# Patient Record
Sex: Female | Born: 1935 | Race: White | Hispanic: No | Smoking: Never smoker
Health system: Southern US, Community
[De-identification: ages and names within clinical notes are randomized; demographics above are authoritative.]

## PROBLEM LIST (undated history)

## (undated) DIAGNOSIS — J449 Chronic obstructive pulmonary disease, unspecified: Secondary | ICD-10-CM

## (undated) DIAGNOSIS — I499 Cardiac arrhythmia, unspecified: Secondary | ICD-10-CM

## (undated) DIAGNOSIS — M1990 Unspecified osteoarthritis, unspecified site: Secondary | ICD-10-CM

## (undated) DIAGNOSIS — N189 Chronic kidney disease, unspecified: Secondary | ICD-10-CM

## (undated) DIAGNOSIS — H409 Unspecified glaucoma: Secondary | ICD-10-CM

## (undated) DIAGNOSIS — S72009A Fracture of unspecified part of neck of unspecified femur, initial encounter for closed fracture: Secondary | ICD-10-CM

## (undated) DIAGNOSIS — J45909 Unspecified asthma, uncomplicated: Secondary | ICD-10-CM

## (undated) DIAGNOSIS — E119 Type 2 diabetes mellitus without complications: Secondary | ICD-10-CM

## (undated) DIAGNOSIS — K219 Gastro-esophageal reflux disease without esophagitis: Secondary | ICD-10-CM

## (undated) DIAGNOSIS — C801 Malignant (primary) neoplasm, unspecified: Secondary | ICD-10-CM

## (undated) DIAGNOSIS — Z950 Presence of cardiac pacemaker: Secondary | ICD-10-CM

## (undated) DIAGNOSIS — I10 Essential (primary) hypertension: Secondary | ICD-10-CM

## (undated) DIAGNOSIS — H9190 Unspecified hearing loss, unspecified ear: Secondary | ICD-10-CM

## (undated) DIAGNOSIS — G4730 Sleep apnea, unspecified: Secondary | ICD-10-CM

## (undated) DIAGNOSIS — I2510 Atherosclerotic heart disease of native coronary artery without angina pectoris: Secondary | ICD-10-CM

## (undated) DIAGNOSIS — I509 Heart failure, unspecified: Secondary | ICD-10-CM

## (undated) DIAGNOSIS — E0590 Thyrotoxicosis, unspecified without thyrotoxic crisis or storm: Secondary | ICD-10-CM

## (undated) DIAGNOSIS — I4891 Unspecified atrial fibrillation: Secondary | ICD-10-CM

## (undated) HISTORY — PX: CHOLECYSTECTOMY: SHX55

## (undated) HISTORY — PX: EYE SURGERY: SHX253

## (undated) HISTORY — PX: ABDOMINAL HYSTERECTOMY: SHX81

## (undated) HISTORY — PX: ENUCLEATION: SHX628

## (undated) HISTORY — DX: Fracture of unspecified part of neck of unspecified femur, initial encounter for closed fracture: S72.009A

## (undated) HISTORY — PX: INSERT / REPLACE / REMOVE PACEMAKER: SUR710

## (undated) HISTORY — PX: BRAIN SURGERY: SHX531

---

## 2004-01-31 ENCOUNTER — Ambulatory Visit: Payer: Self-pay

## 2004-03-14 ENCOUNTER — Ambulatory Visit: Payer: Self-pay | Admitting: Family Medicine

## 2005-02-16 ENCOUNTER — Other Ambulatory Visit: Payer: Self-pay

## 2005-02-16 ENCOUNTER — Emergency Department: Payer: Self-pay | Admitting: Unknown Physician Specialty

## 2005-03-18 ENCOUNTER — Ambulatory Visit: Payer: Self-pay | Admitting: Family Medicine

## 2006-02-03 ENCOUNTER — Ambulatory Visit: Payer: Self-pay | Admitting: Family Medicine

## 2006-03-24 ENCOUNTER — Ambulatory Visit: Payer: Self-pay | Admitting: Family Medicine

## 2006-11-17 ENCOUNTER — Ambulatory Visit: Payer: Self-pay | Admitting: Surgery

## 2006-11-23 ENCOUNTER — Ambulatory Visit: Payer: Self-pay | Admitting: Surgery

## 2006-12-07 DIAGNOSIS — I10 Essential (primary) hypertension: Secondary | ICD-10-CM | POA: Insufficient documentation

## 2007-02-23 ENCOUNTER — Ambulatory Visit: Payer: Self-pay | Admitting: Gastroenterology

## 2007-05-21 ENCOUNTER — Ambulatory Visit: Payer: Self-pay | Admitting: Internal Medicine

## 2008-04-28 ENCOUNTER — Inpatient Hospital Stay: Payer: Self-pay | Admitting: Internal Medicine

## 2008-05-15 ENCOUNTER — Inpatient Hospital Stay: Payer: Self-pay | Admitting: Internal Medicine

## 2008-06-30 ENCOUNTER — Ambulatory Visit: Payer: Self-pay | Admitting: Internal Medicine

## 2009-04-11 ENCOUNTER — Ambulatory Visit: Payer: Self-pay | Admitting: Ophthalmology

## 2009-07-05 ENCOUNTER — Ambulatory Visit: Payer: Self-pay | Admitting: Internal Medicine

## 2009-07-18 ENCOUNTER — Ambulatory Visit: Payer: Self-pay | Admitting: Gastroenterology

## 2009-10-27 ENCOUNTER — Emergency Department: Payer: Self-pay | Admitting: Emergency Medicine

## 2010-07-05 ENCOUNTER — Inpatient Hospital Stay: Payer: Self-pay | Admitting: Internal Medicine

## 2010-08-06 ENCOUNTER — Ambulatory Visit: Payer: Self-pay | Admitting: Internal Medicine

## 2011-06-17 ENCOUNTER — Other Ambulatory Visit: Payer: Self-pay

## 2011-07-08 LAB — CULTURE, FUNGUS WITHOUT SMEAR

## 2011-08-25 ENCOUNTER — Emergency Department: Payer: Self-pay | Admitting: Emergency Medicine

## 2011-09-08 ENCOUNTER — Emergency Department: Payer: Self-pay | Admitting: Emergency Medicine

## 2011-10-22 ENCOUNTER — Emergency Department: Payer: Self-pay | Admitting: *Deleted

## 2011-10-22 LAB — COMPREHENSIVE METABOLIC PANEL
Alkaline Phosphatase: 104 U/L (ref 50–136)
Anion Gap: 11 (ref 7–16)
Bilirubin,Total: 0.8 mg/dL (ref 0.2–1.0)
Calcium, Total: 9.4 mg/dL (ref 8.5–10.1)
Chloride: 106 mmol/L (ref 98–107)
Co2: 22 mmol/L (ref 21–32)
Creatinine: 1.26 mg/dL (ref 0.60–1.30)
EGFR (African American): 44 — ABNORMAL LOW
EGFR (Non-African Amer.): 38 — ABNORMAL LOW
Glucose: 122 mg/dL — ABNORMAL HIGH (ref 65–99)
Osmolality: 285 (ref 275–301)
SGPT (ALT): 27 U/L
Sodium: 139 mmol/L (ref 136–145)
Total Protein: 7.6 g/dL (ref 6.4–8.2)

## 2011-10-22 LAB — CBC
HCT: 35.8 % (ref 35.0–47.0)
HGB: 11.8 g/dL — ABNORMAL LOW (ref 12.0–16.0)
MCH: 29.3 pg (ref 26.0–34.0)
MCV: 89 fL (ref 80–100)
Platelet: 291 10*3/uL (ref 150–440)
RBC: 4.02 10*6/uL (ref 3.80–5.20)
WBC: 9.6 10*3/uL (ref 3.6–11.0)

## 2011-10-22 LAB — URINALYSIS, COMPLETE
Bilirubin,UR: NEGATIVE
Glucose,UR: NEGATIVE mg/dL (ref 0–75)
Ketone: NEGATIVE
Protein: NEGATIVE
Specific Gravity: 1.019 (ref 1.003–1.030)
Squamous Epithelial: 2

## 2012-01-07 ENCOUNTER — Ambulatory Visit: Payer: Self-pay | Admitting: Internal Medicine

## 2013-01-07 ENCOUNTER — Ambulatory Visit: Payer: Self-pay | Admitting: Internal Medicine

## 2013-11-09 DIAGNOSIS — J9611 Chronic respiratory failure with hypoxia: Secondary | ICD-10-CM | POA: Insufficient documentation

## 2014-01-24 ENCOUNTER — Ambulatory Visit: Payer: Self-pay | Admitting: Internal Medicine

## 2014-02-16 ENCOUNTER — Emergency Department: Payer: Self-pay | Admitting: Student

## 2014-02-16 LAB — CBC
HCT: 40.5 % (ref 35.0–47.0)
HGB: 13.3 g/dL (ref 12.0–16.0)
MCH: 30 pg (ref 26.0–34.0)
MCHC: 32.9 g/dL (ref 32.0–36.0)
MCV: 91 fL (ref 80–100)
Platelet: 253 10*3/uL (ref 150–440)
RBC: 4.44 10*6/uL (ref 3.80–5.20)
RDW: 14 % (ref 11.5–14.5)
WBC: 8.1 10*3/uL (ref 3.6–11.0)

## 2014-02-16 LAB — BASIC METABOLIC PANEL
Anion Gap: 8 (ref 7–16)
BUN: 16 mg/dL (ref 7–18)
Calcium, Total: 8.3 mg/dL — ABNORMAL LOW (ref 8.5–10.1)
Chloride: 104 mmol/L (ref 98–107)
Co2: 26 mmol/L (ref 21–32)
Creatinine: 1.18 mg/dL (ref 0.60–1.30)
EGFR (African American): 55 — ABNORMAL LOW
EGFR (Non-African Amer.): 46 — ABNORMAL LOW
Glucose: 175 mg/dL — ABNORMAL HIGH (ref 65–99)
Osmolality: 281 (ref 275–301)
Potassium: 3.5 mmol/L (ref 3.5–5.1)
SODIUM: 138 mmol/L (ref 136–145)

## 2014-02-16 LAB — MAGNESIUM: Magnesium: 1.7 mg/dL — ABNORMAL LOW

## 2014-02-17 ENCOUNTER — Ambulatory Visit: Payer: Self-pay | Admitting: Cardiovascular Disease

## 2014-02-17 DIAGNOSIS — J45909 Unspecified asthma, uncomplicated: Secondary | ICD-10-CM | POA: Insufficient documentation

## 2014-02-17 DIAGNOSIS — I509 Heart failure, unspecified: Secondary | ICD-10-CM | POA: Insufficient documentation

## 2014-12-09 ENCOUNTER — Encounter: Payer: Self-pay | Admitting: Emergency Medicine

## 2014-12-09 ENCOUNTER — Inpatient Hospital Stay
Admission: EM | Admit: 2014-12-09 | Discharge: 2014-12-13 | DRG: 243 | Disposition: A | Discharge status: Home / Self Care | Payer: Medicare HMO | Attending: Internal Medicine | Admitting: Internal Medicine

## 2014-12-09 ENCOUNTER — Emergency Department: Payer: Medicare HMO

## 2014-12-09 DIAGNOSIS — Z9981 Dependence on supplemental oxygen: Secondary | ICD-10-CM

## 2014-12-09 DIAGNOSIS — E119 Type 2 diabetes mellitus without complications: Secondary | ICD-10-CM | POA: Diagnosis present

## 2014-12-09 DIAGNOSIS — R296 Repeated falls: Secondary | ICD-10-CM | POA: Diagnosis present

## 2014-12-09 DIAGNOSIS — I2510 Atherosclerotic heart disease of native coronary artery without angina pectoris: Secondary | ICD-10-CM | POA: Diagnosis present

## 2014-12-09 DIAGNOSIS — Z950 Presence of cardiac pacemaker: Secondary | ICD-10-CM

## 2014-12-09 DIAGNOSIS — R270 Ataxia, unspecified: Secondary | ICD-10-CM | POA: Diagnosis present

## 2014-12-09 DIAGNOSIS — S022XXA Fracture of nasal bones, initial encounter for closed fracture: Secondary | ICD-10-CM | POA: Diagnosis present

## 2014-12-09 DIAGNOSIS — I495 Sick sinus syndrome: Secondary | ICD-10-CM | POA: Diagnosis present

## 2014-12-09 DIAGNOSIS — R001 Bradycardia, unspecified: Secondary | ICD-10-CM | POA: Diagnosis not present

## 2014-12-09 DIAGNOSIS — Y929 Unspecified place or not applicable: Secondary | ICD-10-CM

## 2014-12-09 DIAGNOSIS — R220 Localized swelling, mass and lump, head: Secondary | ICD-10-CM | POA: Diagnosis not present

## 2014-12-09 DIAGNOSIS — R0981 Nasal congestion: Secondary | ICD-10-CM | POA: Diagnosis present

## 2014-12-09 DIAGNOSIS — S61012A Laceration without foreign body of left thumb without damage to nail, initial encounter: Secondary | ICD-10-CM

## 2014-12-09 DIAGNOSIS — R0902 Hypoxemia: Secondary | ICD-10-CM | POA: Diagnosis present

## 2014-12-09 DIAGNOSIS — W109XXA Fall (on) (from) unspecified stairs and steps, initial encounter: Secondary | ICD-10-CM | POA: Diagnosis present

## 2014-12-09 DIAGNOSIS — R29898 Other symptoms and signs involving the musculoskeletal system: Secondary | ICD-10-CM

## 2014-12-09 DIAGNOSIS — I10 Essential (primary) hypertension: Secondary | ICD-10-CM | POA: Diagnosis present

## 2014-12-09 DIAGNOSIS — S51811A Laceration without foreign body of right forearm, initial encounter: Secondary | ICD-10-CM

## 2014-12-09 DIAGNOSIS — S60811A Abrasion of right wrist, initial encounter: Secondary | ICD-10-CM | POA: Diagnosis present

## 2014-12-09 DIAGNOSIS — I4891 Unspecified atrial fibrillation: Secondary | ICD-10-CM | POA: Diagnosis present

## 2014-12-09 DIAGNOSIS — Z8673 Personal history of transient ischemic attack (TIA), and cerebral infarction without residual deficits: Secondary | ICD-10-CM

## 2014-12-09 DIAGNOSIS — S0033XA Contusion of nose, initial encounter: Secondary | ICD-10-CM

## 2014-12-09 DIAGNOSIS — N390 Urinary tract infection, site not specified: Secondary | ICD-10-CM | POA: Diagnosis present

## 2014-12-09 DIAGNOSIS — S51819A Laceration without foreign body of unspecified forearm, initial encounter: Secondary | ICD-10-CM | POA: Diagnosis present

## 2014-12-09 DIAGNOSIS — J449 Chronic obstructive pulmonary disease, unspecified: Secondary | ICD-10-CM | POA: Diagnosis present

## 2014-12-09 HISTORY — DX: Atherosclerotic heart disease of native coronary artery without angina pectoris: I25.10

## 2014-12-09 HISTORY — DX: Essential (primary) hypertension: I10

## 2014-12-09 HISTORY — DX: Type 2 diabetes mellitus without complications: E11.9

## 2014-12-09 LAB — CBC WITH DIFFERENTIAL/PLATELET
BASOS ABS: 0.1 10*3/uL (ref 0–0.1)
Basophils Relative: 1 %
EOS ABS: 0.1 10*3/uL (ref 0–0.7)
EOS PCT: 1 %
HCT: 42 % (ref 35.0–47.0)
Hemoglobin: 14.1 g/dL (ref 12.0–16.0)
Lymphocytes Relative: 19 %
Lymphs Abs: 1.9 10*3/uL (ref 1.0–3.6)
MCH: 30.3 pg (ref 26.0–34.0)
MCHC: 33.5 g/dL (ref 32.0–36.0)
MCV: 90.3 fL (ref 80.0–100.0)
Monocytes Absolute: 0.9 10*3/uL (ref 0.2–0.9)
Monocytes Relative: 9 %
Neutro Abs: 7 10*3/uL — ABNORMAL HIGH (ref 1.4–6.5)
Neutrophils Relative %: 70 %
PLATELETS: 204 10*3/uL (ref 150–440)
RBC: 4.65 MIL/uL (ref 3.80–5.20)
RDW: 15.1 % — AB (ref 11.5–14.5)
WBC: 9.9 10*3/uL (ref 3.6–11.0)

## 2014-12-09 LAB — BASIC METABOLIC PANEL
Anion gap: 14 (ref 5–15)
BUN: 28 mg/dL — AB (ref 6–20)
CALCIUM: 9.4 mg/dL (ref 8.9–10.3)
CO2: 23 mmol/L (ref 22–32)
CREATININE: 1.27 mg/dL — AB (ref 0.44–1.00)
Chloride: 105 mmol/L (ref 101–111)
GFR calc non Af Amer: 36 mL/min — ABNORMAL LOW (ref >60)
GFR, EST AFRICAN AMERICAN: 41 mL/min — AB (ref >60)
Glucose, Bld: 139 mg/dL — ABNORMAL HIGH (ref 65–99)
Potassium: 4 mmol/L (ref 3.5–5.1)
SODIUM: 142 mmol/L (ref 135–145)

## 2014-12-09 LAB — PROTIME-INR
INR: 1.86
PROTHROMBIN TIME: 21.6 s — AB (ref 11.4–15.0)

## 2014-12-09 MED ORDER — SODIUM CHLORIDE 0.9 % IV SOLN
250.0000 mL | INTRAVENOUS | Status: DC | PRN
  Administered 2014-12-12: 12:00:00 via INTRAVENOUS

## 2014-12-09 MED ORDER — AMIODARONE HCL 200 MG PO TABS
100.0000 mg | ORAL_TABLET | Freq: Every day | ORAL | Status: DC
  Filled 2014-12-09: qty 1

## 2014-12-09 MED ORDER — AMLODIPINE BESYLATE 5 MG PO TABS
5.0000 mg | ORAL_TABLET | Freq: Every day | ORAL | Status: DC
  Administered 2014-12-10: 5 mg via ORAL
  Filled 2014-12-09: qty 1

## 2014-12-09 MED ORDER — ACETAMINOPHEN 325 MG PO TABS
650.0000 mg | ORAL_TABLET | Freq: Four times a day (QID) | ORAL | Status: DC | PRN
  Administered 2014-12-09 – 2014-12-12 (×3): 650 mg via ORAL
  Filled 2014-12-09 (×3): qty 2

## 2014-12-09 MED ORDER — SALINE SPRAY 0.65 % NA SOLN
2.0000 | NASAL | Status: DC | PRN
  Administered 2014-12-10 – 2014-12-12 (×5): 2 via NASAL
  Filled 2014-12-09: qty 44

## 2014-12-09 MED ORDER — SODIUM CHLORIDE 0.9 % IJ SOLN
3.0000 mL | INTRAMUSCULAR | Status: DC | PRN
  Administered 2014-12-12: 3 mL via INTRAVENOUS
  Filled 2014-12-09: qty 10

## 2014-12-09 MED ORDER — GLIMEPIRIDE 2 MG PO TABS
2.0000 mg | ORAL_TABLET | Freq: Every day | ORAL | Status: DC
  Administered 2014-12-10 – 2014-12-13 (×4): 2 mg via ORAL
  Filled 2014-12-09 (×4): qty 1

## 2014-12-09 MED ORDER — ACETAMINOPHEN 650 MG RE SUPP: 650.0000 mg | Freq: Four times a day (QID) | RECTAL | Status: DC | PRN

## 2014-12-09 MED ORDER — SODIUM CHLORIDE 0.9 % IJ SOLN
3.0000 mL | Freq: Two times a day (BID) | INTRAMUSCULAR | Status: DC
  Administered 2014-12-10 – 2014-12-13 (×6): 3 mL via INTRAVENOUS

## 2014-12-09 NOTE — ED Provider Notes (Signed)
[facility] Emergency Department Provider Note  ____________________________________________  Time seen: 1705  I have reviewed the triage vital signs and the nursing notes.   HISTORY  Chief Complaint Fall  facial trauma    HPI Sarah Dougherty is a 81 y.o. female who was walking outside on the sidewalk. She tripped on a break in the sidewalk. She fell forward and landed on her nose. She also has some abrasions to her left thumb and right wrist.  The patient denies any loss of consciousness. She has a significant contusion to her nose with an abrasion on the upper portion. She is alert and communicative.  History is obtained by speaking with her, but also with her son and daughter-in-law.   Past Medical History  Diagnosis Date  . On home oxygen therapy     uses at night  . Diabetes mellitus without complication   . Hypertension   . Coronary artery disease     There are no active problems to display for this patient.   History reviewed. No pertinent past surgical history.  No current outpatient prescriptions on file.  Allergies Review of patient's allergies indicates no known allergies.  No family history on file.  Social History Social History  Substance Use Topics  . Smoking status: Never Smoker   . Smokeless tobacco: None  . Alcohol Use: No    Review of Systems  Constitutional: Negative for fever. 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 She has a significant contusion to her nose. See history of present illness Cardiovascular: Negative for chest pain. Respiratory: Negative for shortness of breath. Gastrointestinal: Negative for abdominal pain, vomiting and diarrhea. Genitourinary: Negative for dysuria. Musculoskeletal: No myalgias or injuries. Skin: Bleeding from a couple of abrasions and skin tears. See history of present illness and physical exam area  Neurological: Negative for headaches   10-point ROS otherwise  negative.  ____________________________________________   PHYSICAL EXAM:  VITAL SIGNS: ED Triage Vitals  Enc Vitals Group     BP 12/09/14 1631 181/64 mmHg     Pulse Rate 12/09/14 1631 61     Resp 12/09/14 1631 20     Temp 12/09/14 1631 98.1 F (36.7 C)     Temp Source 12/09/14 1631 Oral     SpO2 12/09/14 1631 95 %     Weight 12/09/14 1631 130 lb (58.968 kg)     Height 12/09/14 1631 5' (1.524 m)     Head Cir --      Peak Flow --      Pain Score 12/09/14 1632 8     Pain Loc --      Pain Edu? --      Excl. in [place]? --     Constitutional: Alert and oriented. Notable contusion to her face.  ENT   Head: Normocephalic and atraumatic, except for her nose.        Nose:  The nose is significantly swollen throughout including the tip and the bridge. She does have a small bleeding laceration in the upper portion of the nose. She has no blood coming from the nares. She has notable congestion status post trauma and unable to breathe through her nose.   Mouth/Throat: Mucous membranes are moist. She has a small laceration on the inside portion of her lower lip. This is approximately half a centimeter long and lies flat without any misalignment. Cardiovascular: Normal rate, regular rhythm, no murmur noted Respiratory:  Normal respiratory effort, no tachypnea.  Breath sounds are clear and equal bilaterally.  Gastrointestinal: Soft and nontender. No distention.  Back: No muscle spasm, no tenderness, no CVA tenderness. Musculoskeletal: No deformity noted. Nontender with normal range of motion in all extremities.  No noted edema. This includes her right forearm and wrist, where she has a small skin tear. There is no bony tenderness, deformity, or dysfunction noted.  She did not injure her knees or legs with this fall. Neurologic:  Normal speech and language. No gross focal neurologic deficits are appreciated.  Skin:  In addition to the small abrasion cut on the bridge of her nose, she has  a small skin tear that is approximately 1 cm in length on the dorsum of her right wrist. She also has a small laceration on her left thumb.Marland Kitchen Psychiatric: Mood and affect are normal. Speech and behavior are normal.  ____________________________________________    LABS (pertinent positives/negatives)  CBC, met B, PT/INR: Pending  ____________________________________________  RADIOLOGY  CT head: CT maxillofacial: CT cervical:  FINDINGS: CT HEAD FINDINGS  Postoperative changes with left frontal and parietal craniotomies. Mild diffuse cerebral atrophy. Patchy low-attenuation changes in the deep white matter consistent with small vessel ischemia. Old right cerebellar infarct. No mass effect or midline shift. No abnormal extra-axial fluid collections. Gray-white matter junctions are distinct. Basal cisterns are not effaced. No evidence of acute intracranial hemorrhage. No depressed skull fractures. Mastoid air cells are not opacified. Vascular calcifications.  CT MAXILLOFACIAL FINDINGS  Right orbital prosthesis. Left globe and extraocular muscles appear intact. Mucosal thickening in the paranasal sinuses. No acute air-fluid levels. Congenital hypoaeration of the frontal sinuses. Mildly depressed fractures of the anterior nasal bones. Soft tissue swelling/ hematoma over the nose. The orbital rims, maxillary antral walls, zygomatic arches, pterygoid plates, mandibles, and temporomandibular joints appear intact. Multiple prior tooth extractions. Degenerative changes in the temporomandibular joints.  CT CERVICAL SPINE FINDINGS  Diffuse bone demineralization. Straightening of the usual cervical lordosis. This may be due to patient positioning but ligamentous injury or muscle spasm could also have this appearance and are not excluded. Degenerative changes throughout the cervical spine with narrowed interspaces and endplate hypertrophic changes. Degenerative changes throughout the  facet joints. Bone encroachment upon bilateral neural foramina. Slight anterior subluxation of C7 on T1 is likely degenerative. Normal alignment of the facet joints. No vertebral compression deformities. No prevertebral soft tissue swelling.  IMPRESSION: No acute intracranial abnormalities. Chronic atrophy and small vessel ischemic changes. Old right cerebellar infarct.  Mild depression of anterior nasal bones with overlying soft tissue swelling. Orbital and facial bones otherwise intact.  Nonspecific straightening of the usual cervical lordosis. Diffuse bone demineralization. Diffuse degenerative changes. No acute displaced fractures identified. ____________________________________________  ____________________________________________   INITIAL IMPRESSION / ASSESSMENT AND PLAN / ED COURSE  Pertinent labs & imaging results that were available during my care of the patient were reviewed by me and considered in my medical decision making (see chart for details).  81 year old female status post fall with significant contusion to her face. We will obtain a CT scan of her head, due to her use of anticoagulants, her maxillofacial bones, and her C-spine.  ----------------------------------------- 7:24 PM on 12/09/2014 -----------------------------------------  CT scan of the head is negative. She has a mild depression of the anterior nasal bone with noted soft tissue swelling. The rest of the facial bones and the bones or orbits are intact.  Upon reexamination at 7:15, the patient's swelling and ecchymosis appears significantly worse. There is additional bruising under both eyes  with mild swelling in that area and a small amount of bruising that is noticed at the septum of her nose.  An additional family members here now who outlines additional history for me. Apparently 2 years ago she had a fall and hit her head. The initial head CT was normal and she was sent home. Later she developed  slurred speech and had to return to the hospital where a subsequent CT was done and found to hematoma. At that time she was transferred to [facility] where she underwent craniotomy and evacuation of the hematoma.   Given that the patient has this history and is also on anticoagulants still, they are understandably apprehensive.  Given this situation along with the fact that she uses oxygen at night but is currently unable to breathe through her nose, we feel it is best to admit her to the hospital for observation and repeat CT scan in the morning to ensure that she does not have any acute hemorrhage.  I have discussed this case with Dr. Meade Maw who will evaluate the patient further and arrange for her admission.   ____________________________________________   FINAL CLINICAL IMPRESSION(S) / ED DIAGNOSES  Final diagnoses:  Nasal fracture, closed, initial encounter  Nasal contusion, initial encounter  Facial swelling  Skin tear of forearm without complication, right, initial encounter  Laceration of thumb, left, initial encounter      Ahmed Prima, MD 12/09/14 1939

## 2014-12-09 NOTE — H&P (Signed)
Sarah Dougherty is an 81 y.o. female.   Chief Complaint: [place] [address] after missing step today and hit her face. C/O pain on her lower lip and nose. Reports no other injuries. Patient wears oxygen at home and with significant nasal swelling it was felt that overnight observation was warrented. Also, patient has history of subdural hematomia that was not detected until 3 weeks after a previous fall.  Past Medical History  Diagnosis Date  . On home oxygen therapy     uses at night  . Diabetes mellitus without complication   . Hypertension   . Coronary artery disease     History reviewed. No pertinent past surgical history.  Hx of craniotomy for subdural hematoma  No family history on file.  Positive for CVA Social History:  reports that she has never smoked. She does not have any smokeless tobacco history on file. She reports that she does not drink alcohol. Her drug history is not on file.  Allergies: Allergies no known allergies   (Not in a hospital admission)  Results for orders placed or performed during the hospital encounter of 12/09/14 (from the past 48 hour(s))  CBC with Differential     Status: Abnormal   Collection Time: 12/09/14  7:49 PM  Result Value Ref Range   WBC 9.9 3.6 - 11.0 K/uL   RBC 4.65 3.80 - 5.20 MIL/uL   Hemoglobin 14.1 12.0 - 16.0 g/dL   HCT 42.0 35.0 - 47.0 %   MCV 90.3 80.0 - 100.0 fL   MCH 30.3 26.0 - 34.0 pg   MCHC 33.5 32.0 - 36.0 g/dL   RDW 15.1 (H) 11.5 - 14.5 %   Platelets 204 150 - 440 K/uL   Neutrophils Relative % 70 %   Neutro Abs 7.0 (H) 1.4 - 6.5 K/uL   Lymphocytes Relative 19 %   Lymphs Abs 1.9 1.0 - 3.6 K/uL   Monocytes Relative 9 %   Monocytes Absolute 0.9 0.2 - 0.9 K/uL   Eosinophils Relative 1 %   Eosinophils Absolute 0.1 0 - 0.7 K/uL   Basophils Relative 1 %   Basophils Absolute 0.1 0 - 0.1 K/uL  Protime-INR     Status: Abnormal   Collection Time: 12/09/14  7:49 PM  Result Value Ref Range   Prothrombin Time 21.6 (H) 11.4  - 15.0 seconds   INR 7.02   Basic metabolic panel     Status: Abnormal   Collection Time: 12/09/14  7:49 PM  Result Value Ref Range   Sodium 142 135 - 145 mmol/L   Potassium 4.0 3.5 - 5.1 mmol/L   Chloride 105 101 - 111 mmol/L   CO2 23 22 - 32 mmol/L   Glucose, Bld 139 (H) 65 - 99 mg/dL   BUN 28 (H) 6 - 20 mg/dL   Creatinine, Ser 1.27 (H) 0.44 - 1.00 mg/dL   Calcium 9.4 8.9 - 10.3 mg/dL   GFR calc non Af Amer 36 (L) >60 mL/min   GFR calc Af Amer 41 (L) >60 mL/min    Comment: (NOTE) The eGFR has been calculated using the CKD EPI equation. This calculation has not been validated in all clinical situations. eGFR's persistently <60 mL/min signify possible Chronic Kidney Disease.    Anion gap 14 5 - 15   Ct Head Wo Contrast  12/09/2014   CLINICAL DATA:  Trip and fall injury, striking face and head. Patient takes blood thinners. No loss of consciousness. Small laceration to the lip.  Swelling of the nose. Neck pain.  EXAM: CT HEAD WITHOUT CONTRAST  CT MAXILLOFACIAL WITHOUT CONTRAST  CT CERVICAL SPINE WITHOUT CONTRAST  TECHNIQUE: Multidetector CT imaging of the head, cervical spine, and maxillofacial structures were performed using the standard protocol without intravenous contrast. Multiplanar CT image reconstructions of the cervical spine and maxillofacial structures were also generated.  COMPARISON:  CT head 10/22/2011.  CT cervical spine 08/25/2011  FINDINGS: CT HEAD FINDINGS  Postoperative changes with left frontal and parietal craniotomies. Mild diffuse cerebral atrophy. Patchy low-attenuation changes in the deep white matter consistent with small vessel ischemia. Old right cerebellar infarct. No mass effect or midline shift. No abnormal extra-axial fluid collections. Gray-white matter junctions are distinct. Basal cisterns are not effaced. No evidence of acute intracranial hemorrhage. No depressed skull fractures. Mastoid air cells are not opacified. Vascular calcifications.  CT MAXILLOFACIAL  FINDINGS  Right orbital prosthesis. Left globe and extraocular muscles appear intact. Mucosal thickening in the paranasal sinuses. No acute air-fluid levels. Congenital hypoaeration of the frontal sinuses. Mildly depressed fractures of the anterior nasal bones. Soft tissue swelling/ hematoma over the nose. The orbital rims, maxillary antral walls, zygomatic arches, pterygoid plates, mandibles, and temporomandibular joints appear intact. Multiple prior tooth extractions. Degenerative changes in the temporomandibular joints.  CT CERVICAL SPINE FINDINGS  Diffuse bone demineralization. Straightening of the usual cervical lordosis. This may be due to patient positioning but ligamentous injury or muscle spasm could also have this appearance and are not excluded. Degenerative changes throughout the cervical spine with narrowed interspaces and endplate hypertrophic changes. Degenerative changes throughout the facet joints. Bone encroachment upon bilateral neural foramina. Slight anterior subluxation of C7 on T1 is likely degenerative. Normal alignment of the facet joints. No vertebral compression deformities. No prevertebral soft tissue swelling.  IMPRESSION: No acute intracranial abnormalities. Chronic atrophy and small vessel ischemic changes. Old right cerebellar infarct.  Mild depression of anterior nasal bones with overlying soft tissue swelling. Orbital and facial bones otherwise intact.  Nonspecific straightening of the usual cervical lordosis. Diffuse bone demineralization. Diffuse degenerative changes. No acute displaced fractures identified.   Electronically Signed   By: Lucienne Capers M.D.   On: 12/09/2014 18:28   Ct Cervical Spine Wo Contrast  12/09/2014   CLINICAL DATA:  Trip and fall injury, striking face and head. Patient takes blood thinners. No loss of consciousness. Small laceration to the lip. Swelling of the nose. Neck pain.  EXAM: CT HEAD WITHOUT CONTRAST  CT MAXILLOFACIAL WITHOUT CONTRAST  CT  CERVICAL SPINE WITHOUT CONTRAST  TECHNIQUE: Multidetector CT imaging of the head, cervical spine, and maxillofacial structures were performed using the standard protocol without intravenous contrast. Multiplanar CT image reconstructions of the cervical spine and maxillofacial structures were also generated.  COMPARISON:  CT head 10/22/2011.  CT cervical spine 08/25/2011  FINDINGS: CT HEAD FINDINGS  Postoperative changes with left frontal and parietal craniotomies. Mild diffuse cerebral atrophy. Patchy low-attenuation changes in the deep white matter consistent with small vessel ischemia. Old right cerebellar infarct. No mass effect or midline shift. No abnormal extra-axial fluid collections. Gray-white matter junctions are distinct. Basal cisterns are not effaced. No evidence of acute intracranial hemorrhage. No depressed skull fractures. Mastoid air cells are not opacified. Vascular calcifications.  CT MAXILLOFACIAL FINDINGS  Right orbital prosthesis. Left globe and extraocular muscles appear intact. Mucosal thickening in the paranasal sinuses. No acute air-fluid levels. Congenital hypoaeration of the frontal sinuses. Mildly depressed fractures of the anterior nasal bones. Soft tissue swelling/ hematoma over the nose.  The orbital rims, maxillary antral walls, zygomatic arches, pterygoid plates, mandibles, and temporomandibular joints appear intact. Multiple prior tooth extractions. Degenerative changes in the temporomandibular joints.  CT CERVICAL SPINE FINDINGS  Diffuse bone demineralization. Straightening of the usual cervical lordosis. This may be due to patient positioning but ligamentous injury or muscle spasm could also have this appearance and are not excluded. Degenerative changes throughout the cervical spine with narrowed interspaces and endplate hypertrophic changes. Degenerative changes throughout the facet joints. Bone encroachment upon bilateral neural foramina. Slight anterior subluxation of C7 on T1  is likely degenerative. Normal alignment of the facet joints. No vertebral compression deformities. No prevertebral soft tissue swelling.  IMPRESSION: No acute intracranial abnormalities. Chronic atrophy and small vessel ischemic changes. Old right cerebellar infarct.  Mild depression of anterior nasal bones with overlying soft tissue swelling. Orbital and facial bones otherwise intact.  Nonspecific straightening of the usual cervical lordosis. Diffuse bone demineralization. Diffuse degenerative changes. No acute displaced fractures identified.   Electronically Signed   By: Lucienne Capers M.D.   On: 12/09/2014 18:28   Ct Maxillofacial Wo Cm  12/09/2014   CLINICAL DATA:  Trip and fall injury, striking face and head. Patient takes blood thinners. No loss of consciousness. Small laceration to the lip. Swelling of the nose. Neck pain.  EXAM: CT HEAD WITHOUT CONTRAST  CT MAXILLOFACIAL WITHOUT CONTRAST  CT CERVICAL SPINE WITHOUT CONTRAST  TECHNIQUE: Multidetector CT imaging of the head, cervical spine, and maxillofacial structures were performed using the standard protocol without intravenous contrast. Multiplanar CT image reconstructions of the cervical spine and maxillofacial structures were also generated.  COMPARISON:  CT head 10/22/2011.  CT cervical spine 08/25/2011  FINDINGS: CT HEAD FINDINGS  Postoperative changes with left frontal and parietal craniotomies. Mild diffuse cerebral atrophy. Patchy low-attenuation changes in the deep white matter consistent with small vessel ischemia. Old right cerebellar infarct. No mass effect or midline shift. No abnormal extra-axial fluid collections. Gray-white matter junctions are distinct. Basal cisterns are not effaced. No evidence of acute intracranial hemorrhage. No depressed skull fractures. Mastoid air cells are not opacified. Vascular calcifications.  CT MAXILLOFACIAL FINDINGS  Right orbital prosthesis. Left globe and extraocular muscles appear intact. Mucosal  thickening in the paranasal sinuses. No acute air-fluid levels. Congenital hypoaeration of the frontal sinuses. Mildly depressed fractures of the anterior nasal bones. Soft tissue swelling/ hematoma over the nose. The orbital rims, maxillary antral walls, zygomatic arches, pterygoid plates, mandibles, and temporomandibular joints appear intact. Multiple prior tooth extractions. Degenerative changes in the temporomandibular joints.  CT CERVICAL SPINE FINDINGS  Diffuse bone demineralization. Straightening of the usual cervical lordosis. This may be due to patient positioning but ligamentous injury or muscle spasm could also have this appearance and are not excluded. Degenerative changes throughout the cervical spine with narrowed interspaces and endplate hypertrophic changes. Degenerative changes throughout the facet joints. Bone encroachment upon bilateral neural foramina. Slight anterior subluxation of C7 on T1 is likely degenerative. Normal alignment of the facet joints. No vertebral compression deformities. No prevertebral soft tissue swelling.  IMPRESSION: No acute intracranial abnormalities. Chronic atrophy and small vessel ischemic changes. Old right cerebellar infarct.  Mild depression of anterior nasal bones with overlying soft tissue swelling. Orbital and facial bones otherwise intact.  Nonspecific straightening of the usual cervical lordosis. Diffuse bone demineralization. Diffuse degenerative changes. No acute displaced fractures identified.   Electronically Signed   By: Lucienne Capers M.D.   On: 12/09/2014 18:28    Review of Systems  Constitutional:  Negative for fever and chills.  HENT: Negative for hearing loss.   Eyes: Negative for blurred vision and redness.  Respiratory: Negative for shortness of breath.   Cardiovascular: Negative for chest pain and leg swelling.  Gastrointestinal: Negative for nausea, vomiting and diarrhea.  Genitourinary: Negative for frequency and flank pain.   Musculoskeletal: Positive for joint pain.  Skin: Negative for rash.  Neurological: Negative for seizures and weakness.    Blood pressure 166/98, pulse 59, temperature 98.1 F (36.7 C), temperature source Oral, resp. rate 20, height 5' (1.524 m), weight 58.968 kg (130 lb), SpO2 95 %. Physical Exam  Constitutional: She is oriented to person, place, and time. She appears well-developed and well-nourished. No distress.  HENT:  Mouth/Throat: No oropharyngeal exudate.  Large area of echymosis cheecks and bridge of the nose. Nasal swelling. Small laceration on bottom lip.  Eyes: EOM are normal. Pupils are equal, round, and reactive to light. No scleral icterus.  Neck: No JVD present. No tracheal deviation present. No thyromegaly present.  Cardiovascular: Normal rate.   No murmur heard. Respiratory: No respiratory distress.  Clear to ascultation. No dullness to percussion  GI: Soft. Bowel sounds are normal. She exhibits no distension and no mass.  Musculoskeletal: She exhibits no edema or tenderness.  Lymphadenopathy:    She has no cervical adenopathy.  Neurological: She is alert and oriented to person, place, and time. Coordination normal.  Cranial nerves 2-12 grossly intact.  Skin: Skin is warm and dry. No rash noted.     Assessment/Plan 1. Oxygen Dependant COPD: Satting 90% on room air currently. However with significant nasal swelling feel it is unsafe to discharge home. Will admit for observation. Oxygen as needed. Treat COPD with home meds.  2. Nasal Swelling: Ice pern. Observe for any acclusion,  3. Fall: Consider PT evaluation. Neuro checks in light of history of subdural hematoma after fall in the past.  4. HTN: Continue current meds.  Time = 60mn  JBaxter Hire8/27/2016, 8:22 PM

## 2014-12-09 NOTE — ED Notes (Signed)
Patient presents to the ED after tripping over a break in the sidewalk and falling and hitting her nose.  Patient takes blood thinners.  Patient did not lose consciousness.  Patient has an abrasion to her left thumb and a skin tear on her right forearm.  Patient also has a small laceration to her lip.  Patient's nose is very swollen and purple in color.

## 2014-12-10 ENCOUNTER — Inpatient Hospital Stay
Admit: 2014-12-10 | Discharge: 2014-12-10 | Disposition: A | Discharge status: Home / Self Care | Payer: Medicare HMO | Attending: Physician Assistant | Admitting: Physician Assistant

## 2014-12-10 DIAGNOSIS — N390 Urinary tract infection, site not specified: Secondary | ICD-10-CM | POA: Diagnosis not present

## 2014-12-10 DIAGNOSIS — R220 Localized swelling, mass and lump, head: Secondary | ICD-10-CM | POA: Diagnosis present

## 2014-12-10 DIAGNOSIS — I2510 Atherosclerotic heart disease of native coronary artery without angina pectoris: Secondary | ICD-10-CM | POA: Diagnosis not present

## 2014-12-10 DIAGNOSIS — Y929 Unspecified place or not applicable: Secondary | ICD-10-CM | POA: Diagnosis not present

## 2014-12-10 DIAGNOSIS — I495 Sick sinus syndrome: Secondary | ICD-10-CM | POA: Diagnosis not present

## 2014-12-10 DIAGNOSIS — Z8673 Personal history of transient ischemic attack (TIA), and cerebral infarction without residual deficits: Secondary | ICD-10-CM | POA: Diagnosis not present

## 2014-12-10 DIAGNOSIS — I10 Essential (primary) hypertension: Secondary | ICD-10-CM | POA: Diagnosis not present

## 2014-12-10 DIAGNOSIS — I4891 Unspecified atrial fibrillation: Secondary | ICD-10-CM | POA: Diagnosis not present

## 2014-12-10 DIAGNOSIS — R270 Ataxia, unspecified: Secondary | ICD-10-CM | POA: Diagnosis not present

## 2014-12-10 DIAGNOSIS — S60811A Abrasion of right wrist, initial encounter: Secondary | ICD-10-CM | POA: Diagnosis not present

## 2014-12-10 DIAGNOSIS — J449 Chronic obstructive pulmonary disease, unspecified: Secondary | ICD-10-CM | POA: Diagnosis not present

## 2014-12-10 DIAGNOSIS — R0981 Nasal congestion: Secondary | ICD-10-CM | POA: Diagnosis not present

## 2014-12-10 DIAGNOSIS — Z9981 Dependence on supplemental oxygen: Secondary | ICD-10-CM | POA: Diagnosis not present

## 2014-12-10 DIAGNOSIS — R001 Bradycardia, unspecified: Secondary | ICD-10-CM | POA: Diagnosis present

## 2014-12-10 DIAGNOSIS — E119 Type 2 diabetes mellitus without complications: Secondary | ICD-10-CM | POA: Diagnosis not present

## 2014-12-10 DIAGNOSIS — R296 Repeated falls: Secondary | ICD-10-CM | POA: Diagnosis not present

## 2014-12-10 DIAGNOSIS — R0902 Hypoxemia: Secondary | ICD-10-CM | POA: Diagnosis not present

## 2014-12-10 DIAGNOSIS — S51819A Laceration without foreign body of unspecified forearm, initial encounter: Secondary | ICD-10-CM | POA: Diagnosis not present

## 2014-12-10 DIAGNOSIS — W109XXA Fall (on) (from) unspecified stairs and steps, initial encounter: Secondary | ICD-10-CM | POA: Diagnosis not present

## 2014-12-10 DIAGNOSIS — S022XXA Fracture of nasal bones, initial encounter for closed fracture: Secondary | ICD-10-CM | POA: Diagnosis not present

## 2014-12-10 LAB — GLUCOSE, CAPILLARY
Glucose-Capillary: 130 mg/dL — ABNORMAL HIGH (ref 65–99)
Glucose-Capillary: 158 mg/dL — ABNORMAL HIGH (ref 65–99)
Glucose-Capillary: 140 mg/dL — ABNORMAL HIGH (ref 65–99)

## 2014-12-10 MED ORDER — HYDRALAZINE HCL 20 MG/ML IJ SOLN: 10.0000 mg | INTRAMUSCULAR | Status: DC | PRN

## 2014-12-10 MED ORDER — AMLODIPINE BESYLATE 5 MG PO TABS
5.0000 mg | ORAL_TABLET | Freq: Every day | ORAL | Status: DC
  Administered 2014-12-10 – 2014-12-13 (×4): 5 mg via ORAL
  Filled 2014-12-10 (×4): qty 1

## 2014-12-10 MED ORDER — INSULIN ASPART 100 UNIT/ML ~~LOC~~ SOLN
0.0000 [IU] | Freq: Three times a day (TID) | SUBCUTANEOUS | Status: DC
  Administered 2014-12-10: 2 [IU] via SUBCUTANEOUS
  Administered 2014-12-10: 1 [IU] via SUBCUTANEOUS
  Administered 2014-12-10: 2 [IU] via SUBCUTANEOUS
  Administered 2014-12-11: 3 [IU] via SUBCUTANEOUS
  Administered 2014-12-11: 1 [IU] via SUBCUTANEOUS
  Administered 2014-12-12: 2 [IU] via SUBCUTANEOUS
  Administered 2014-12-13 (×2): 1 [IU] via SUBCUTANEOUS
  Filled 2014-12-10: qty 3
  Filled 2014-12-10: qty 2
  Filled 2014-12-10 (×3): qty 1
  Filled 2014-12-10: qty 2
  Filled 2014-12-10: qty 1
  Filled 2014-12-10: qty 2

## 2014-12-10 MED ORDER — PNEUMOCOCCAL VAC POLYVALENT 25 MCG/0.5ML IJ INJ
0.5000 mL | INJECTION | INTRAMUSCULAR | Status: AC
  Administered 2014-12-11: 0.5 mL via INTRAMUSCULAR
  Filled 2014-12-10: qty 0.5

## 2014-12-10 NOTE — Progress Notes (Signed)
HEART RATE  DOWN TO 33-39. PT ASX. DR [facility] NOTIFIED NEED TO TRANSFER TO ANOTHER LEVEL OF CARE. PT TO GO 2A UNIT

## 2014-12-10 NOTE — Progress Notes (Signed)
Patient has right prosthetic eye

## 2014-12-10 NOTE — Progress Notes (Signed)
*  PRELIMINARY RESULTS* Echocardiogram 2D Echocardiogram has been performed.  Sarah Dougherty 12/10/2014, 2:48 PM

## 2014-12-10 NOTE — Progress Notes (Signed)
Tele monitor reading irregular rhythm, hard to find P waves. No EKG on chart. Per Dr. Volanda Napoleon, order STAT EKG

## 2014-12-10 NOTE — Progress Notes (Signed)
Patient complaining of nasal congestion. Spoke with Dr. Volanda Napoleon about receiving an order for saline nasal spray. Order received and placed. Nursing staff will continue to monitor. Earleen Reaper, RN

## 2014-12-10 NOTE — Progress Notes (Signed)
PT Cancellation Note  Patient Details Name: Sarah Dougherty MRN: [id number] DOB: 1921-05-31   Cancelled Treatment:    Reason Eval/Treat Not Completed: Medical issues which prohibited therapy. Patient noted to have abnormal rhythms on telemetry and bradycardia. There was a stat EKG ordered to investigate the above and discussion of PPM placement depending on clinical course. PT will defer mobility evaluation until patient is more medically appropriate for mobility.  Kerman Passey, PT, DPT    12/10/2014, 12:01 PM

## 2014-12-10 NOTE — Progress Notes (Signed)
PT IS A DIABETIC . NO FSBS, BP HIGH AND PULSE LOW.. DR [facility]  NOTIFIED OF ABOVE. MD TO  ORDER CARDIAC MONITORING,SLIDING SCALE AND EVAL PT

## 2014-12-10 NOTE — Progress Notes (Signed)
Dr. Volanda Napoleon aware that patient is now in room on 2A. BP is elevated - per MD, do not give anything right now, MD to assess and place appropriate orders.

## 2014-12-10 NOTE — Consult Note (Signed)
Primary Cardiologist: Dr. Neoma Laming    Reason for Consultation : a-fib, bradycardia   HPI : This is a 81 yo F well known to our practice, presented to ER yesterday c/o mechanical fall. She states she missed a step and fell, denies pre-syncope or syncope. No CP or SOB. C/o nasal congestion.         Review of Systems: General: negative for chills, fever, night sweats or weight changes.  Cardiovascular: negative for chest pain, edema, orthopnea, palpitations, paroxysmal nocturnal dyspnea, shortness of breath or dyspnea on exertion Dermatological: negative for rash Respiratory: negative for cough or wheezing Urologic: negative for hematuria Abdominal: negative for nausea, vomiting, diarrhea, bright red blood per rectum, melena, or hematemesis Neurologic: negative for visual changes, syncope, or dizziness All other systems reviewed and are otherwise negative except as noted above.    Past Medical History  Diagnosis Date  . On home oxygen therapy     uses at night  . Diabetes mellitus without complication   . Hypertension   . Coronary artery disease     Medications Prior to Admission  Medication Sig Dispense Refill  . acetaminophen (TYLENOL 8 HOUR) 650 MG CR tablet Take 650 mg by mouth every 8 (eight) hours as needed for pain.    Marland Kitchen amiodarone (PACERONE) 200 MG tablet Take 100 mg by mouth daily.    Marland Kitchen amLODipine (NORVASC) 5 MG tablet Take 5 mg by mouth daily.    . clotrimazole (GYNE-LOTRIMIN) 1 % vaginal cream Place 1 application vaginally daily as needed. For yeast infection.    Marland Kitchen glimepiride (AMARYL) 2 MG tablet Take 2 mg by mouth daily.    . hydrocortisone cream (NEOSPORIN ECZEMA ESSENT MAX ST) 1 % Apply 1 application topically 2 (two) times daily as needed for itching (for sores.).    Marland Kitchen Rivaroxaban (XARELTO PO) Take 1 tablet by mouth daily.    . sodium chloride (OCEAN) 0.65 % nasal spray Place 2 sprays into the nose daily as needed.       Marland Kitchen amLODipine  5 mg Oral Daily   . glimepiride  2 mg Oral Daily  . insulin aspart  0-9 Units Subcutaneous TID WC  . sodium chloride  3 mL Intravenous Q12H    Infusions:    Allergies  Allergen Reactions  . Nitrofurantoin     Other reaction(s): Unknown  . Penicillins     Other reaction(s): Unknown  . Sulfa Antibiotics     Other reaction(s): Unknown  . Azithromycin Rash    Social History   Social History  . Marital Status: Widowed    Spouse Name: N/A  . Number of Children: N/A  . Years of Education: N/A   Occupational History  . Not on file.   Social History Main Topics  . Smoking status: Never Smoker   . Smokeless tobacco: Not on file  . Alcohol Use: No  . Drug Use: Not on file  . Sexual Activity: Not on file   Other Topics Concern  . Not on file   Social History Narrative  . No narrative on file    No family history on file.  PHYSICAL EXAM: Filed Vitals:   12/10/14 1111  BP: 181/62  Pulse: 44  Temp: 97.6 F (36.4 C)  Resp: 18     Intake/Output Summary (Last 24 hours) at 12/10/14 1112 Last data filed at 12/09/14 2316  Gross per 24 hour  Intake    240 ml  Output  0 ml  Net    240 ml    General:  Well appearing. No respiratory difficulty HEENT: normal Neck: supple. no JVD. Carotids 2+ bilat; no bruits. No lymphadenopathy or thryomegaly appreciated. Cor: PMI nondisplaced. Regular rate & rhythm. No rubs, gallops or murmurs. Lungs: clear Abdomen: soft, nontender, nondistended. No hepatosplenomegaly. No bruits or masses. Good bowel sounds. Extremities: no cyanosis, clubbing, rash, edema Neuro: alert & oriented x 3, cranial nerves grossly intact. moves all 4 extremities w/o difficulty. Affect pleasant.  ECG: sinus bradycardia 38 BPM   Results for orders placed or performed during the hospital encounter of 12/09/14 (from the past 24 hour(s))  CBC with Differential     Status: Abnormal   Collection Time: 12/09/14  7:49 PM  Result Value Ref Range   WBC 9.9 3.6 - 11.0 K/uL    RBC 4.65 3.80 - 5.20 MIL/uL   Hemoglobin 14.1 12.0 - 16.0 g/dL   HCT 42.0 35.0 - 47.0 %   MCV 90.3 80.0 - 100.0 fL   MCH 30.3 26.0 - 34.0 pg   MCHC 33.5 32.0 - 36.0 g/dL   RDW 15.1 (H) 11.5 - 14.5 %   Platelets 204 150 - 440 K/uL   Neutrophils Relative % 70 %   Neutro Abs 7.0 (H) 1.4 - 6.5 K/uL   Lymphocytes Relative 19 %   Lymphs Abs 1.9 1.0 - 3.6 K/uL   Monocytes Relative 9 %   Monocytes Absolute 0.9 0.2 - 0.9 K/uL   Eosinophils Relative 1 %   Eosinophils Absolute 0.1 0 - 0.7 K/uL   Basophils Relative 1 %   Basophils Absolute 0.1 0 - 0.1 K/uL  Protime-INR     Status: Abnormal   Collection Time: 12/09/14  7:49 PM  Result Value Ref Range   Prothrombin Time 21.6 (H) 11.4 - 15.0 seconds   INR 5.73   Basic metabolic panel     Status: Abnormal   Collection Time: 12/09/14  7:49 PM  Result Value Ref Range   Sodium 142 135 - 145 mmol/L   Potassium 4.0 3.5 - 5.1 mmol/L   Chloride 105 101 - 111 mmol/L   CO2 23 22 - 32 mmol/L   Glucose, Bld 139 (H) 65 - 99 mg/dL   BUN 28 (H) 6 - 20 mg/dL   Creatinine, Ser 1.27 (H) 0.44 - 1.00 mg/dL   Calcium 9.4 8.9 - 10.3 mg/dL   GFR calc non Af Amer 36 (L) >60 mL/min   GFR calc Af Amer 41 (L) >60 mL/min   Anion gap 14 5 - 15   Ct Head Wo Contrast  12/09/2014   CLINICAL DATA:  Trip and fall injury, striking face and head. Patient takes blood thinners. No loss of consciousness. Small laceration to the lip. Swelling of the nose. Neck pain.  EXAM: CT HEAD WITHOUT CONTRAST  CT MAXILLOFACIAL WITHOUT CONTRAST  CT CERVICAL SPINE WITHOUT CONTRAST  TECHNIQUE: Multidetector CT imaging of the head, cervical spine, and maxillofacial structures were performed using the standard protocol without intravenous contrast. Multiplanar CT image reconstructions of the cervical spine and maxillofacial structures were also generated.  COMPARISON:  CT head 10/22/2011.  CT cervical spine 08/25/2011  FINDINGS: CT HEAD FINDINGS  Postoperative changes with left frontal and parietal  craniotomies. Mild diffuse cerebral atrophy. Patchy low-attenuation changes in the deep white matter consistent with small vessel ischemia. Old right cerebellar infarct. No mass effect or midline shift. No abnormal extra-axial fluid collections. Gray-white matter junctions are distinct. Basal cisterns  are not effaced. No evidence of acute intracranial hemorrhage. No depressed skull fractures. Mastoid air cells are not opacified. Vascular calcifications.  CT MAXILLOFACIAL FINDINGS  Right orbital prosthesis. Left globe and extraocular muscles appear intact. Mucosal thickening in the paranasal sinuses. No acute air-fluid levels. Congenital hypoaeration of the frontal sinuses. Mildly depressed fractures of the anterior nasal bones. Soft tissue swelling/ hematoma over the nose. The orbital rims, maxillary antral walls, zygomatic arches, pterygoid plates, mandibles, and temporomandibular joints appear intact. Multiple prior tooth extractions. Degenerative changes in the temporomandibular joints.  CT CERVICAL SPINE FINDINGS  Diffuse bone demineralization. Straightening of the usual cervical lordosis. This may be due to patient positioning but ligamentous injury or muscle spasm could also have this appearance and are not excluded. Degenerative changes throughout the cervical spine with narrowed interspaces and endplate hypertrophic changes. Degenerative changes throughout the facet joints. Bone encroachment upon bilateral neural foramina. Slight anterior subluxation of C7 on T1 is likely degenerative. Normal alignment of the facet joints. No vertebral compression deformities. No prevertebral soft tissue swelling.  IMPRESSION: No acute intracranial abnormalities. Chronic atrophy and small vessel ischemic changes. Old right cerebellar infarct.  Mild depression of anterior nasal bones with overlying soft tissue swelling. Orbital and facial bones otherwise intact.  Nonspecific straightening of the usual cervical lordosis.  Diffuse bone demineralization. Diffuse degenerative changes. No acute displaced fractures identified.   Electronically Signed   By: Lucienne Capers M.D.   On: 12/09/2014 18:28   Ct Cervical Spine Wo Contrast  12/09/2014   CLINICAL DATA:  Trip and fall injury, striking face and head. Patient takes blood thinners. No loss of consciousness. Small laceration to the lip. Swelling of the nose. Neck pain.  EXAM: CT HEAD WITHOUT CONTRAST  CT MAXILLOFACIAL WITHOUT CONTRAST  CT CERVICAL SPINE WITHOUT CONTRAST  TECHNIQUE: Multidetector CT imaging of the head, cervical spine, and maxillofacial structures were performed using the standard protocol without intravenous contrast. Multiplanar CT image reconstructions of the cervical spine and maxillofacial structures were also generated.  COMPARISON:  CT head 10/22/2011.  CT cervical spine 08/25/2011  FINDINGS: CT HEAD FINDINGS  Postoperative changes with left frontal and parietal craniotomies. Mild diffuse cerebral atrophy. Patchy low-attenuation changes in the deep white matter consistent with small vessel ischemia. Old right cerebellar infarct. No mass effect or midline shift. No abnormal extra-axial fluid collections. Gray-white matter junctions are distinct. Basal cisterns are not effaced. No evidence of acute intracranial hemorrhage. No depressed skull fractures. Mastoid air cells are not opacified. Vascular calcifications.  CT MAXILLOFACIAL FINDINGS  Right orbital prosthesis. Left globe and extraocular muscles appear intact. Mucosal thickening in the paranasal sinuses. No acute air-fluid levels. Congenital hypoaeration of the frontal sinuses. Mildly depressed fractures of the anterior nasal bones. Soft tissue swelling/ hematoma over the nose. The orbital rims, maxillary antral walls, zygomatic arches, pterygoid plates, mandibles, and temporomandibular joints appear intact. Multiple prior tooth extractions. Degenerative changes in the temporomandibular joints.  CT CERVICAL  SPINE FINDINGS  Diffuse bone demineralization. Straightening of the usual cervical lordosis. This may be due to patient positioning but ligamentous injury or muscle spasm could also have this appearance and are not excluded. Degenerative changes throughout the cervical spine with narrowed interspaces and endplate hypertrophic changes. Degenerative changes throughout the facet joints. Bone encroachment upon bilateral neural foramina. Slight anterior subluxation of C7 on T1 is likely degenerative. Normal alignment of the facet joints. No vertebral compression deformities. No prevertebral soft tissue swelling.  IMPRESSION: No acute intracranial abnormalities. Chronic atrophy and small  vessel ischemic changes. Old right cerebellar infarct.  Mild depression of anterior nasal bones with overlying soft tissue swelling. Orbital and facial bones otherwise intact.  Nonspecific straightening of the usual cervical lordosis. Diffuse bone demineralization. Diffuse degenerative changes. No acute displaced fractures identified.   Electronically Signed   By: Lucienne Capers M.D.   On: 12/09/2014 18:28   Ct Maxillofacial Wo Cm  12/09/2014   CLINICAL DATA:  Trip and fall injury, striking face and head. Patient takes blood thinners. No loss of consciousness. Small laceration to the lip. Swelling of the nose. Neck pain.  EXAM: CT HEAD WITHOUT CONTRAST  CT MAXILLOFACIAL WITHOUT CONTRAST  CT CERVICAL SPINE WITHOUT CONTRAST  TECHNIQUE: Multidetector CT imaging of the head, cervical spine, and maxillofacial structures were performed using the standard protocol without intravenous contrast. Multiplanar CT image reconstructions of the cervical spine and maxillofacial structures were also generated.  COMPARISON:  CT head 10/22/2011.  CT cervical spine 08/25/2011  FINDINGS: CT HEAD FINDINGS  Postoperative changes with left frontal and parietal craniotomies. Mild diffuse cerebral atrophy. Patchy low-attenuation changes in the deep white  matter consistent with small vessel ischemia. Old right cerebellar infarct. No mass effect or midline shift. No abnormal extra-axial fluid collections. Gray-white matter junctions are distinct. Basal cisterns are not effaced. No evidence of acute intracranial hemorrhage. No depressed skull fractures. Mastoid air cells are not opacified. Vascular calcifications.  CT MAXILLOFACIAL FINDINGS  Right orbital prosthesis. Left globe and extraocular muscles appear intact. Mucosal thickening in the paranasal sinuses. No acute air-fluid levels. Congenital hypoaeration of the frontal sinuses. Mildly depressed fractures of the anterior nasal bones. Soft tissue swelling/ hematoma over the nose. The orbital rims, maxillary antral walls, zygomatic arches, pterygoid plates, mandibles, and temporomandibular joints appear intact. Multiple prior tooth extractions. Degenerative changes in the temporomandibular joints.  CT CERVICAL SPINE FINDINGS  Diffuse bone demineralization. Straightening of the usual cervical lordosis. This may be due to patient positioning but ligamentous injury or muscle spasm could also have this appearance and are not excluded. Degenerative changes throughout the cervical spine with narrowed interspaces and endplate hypertrophic changes. Degenerative changes throughout the facet joints. Bone encroachment upon bilateral neural foramina. Slight anterior subluxation of C7 on T1 is likely degenerative. Normal alignment of the facet joints. No vertebral compression deformities. No prevertebral soft tissue swelling.  IMPRESSION: No acute intracranial abnormalities. Chronic atrophy and small vessel ischemic changes. Old right cerebellar infarct.  Mild depression of anterior nasal bones with overlying soft tissue swelling. Orbital and facial bones otherwise intact.  Nonspecific straightening of the usual cervical lordosis. Diffuse bone demineralization. Diffuse degenerative changes. No acute displaced fractures  identified.   Electronically Signed   By: Lucienne Capers M.D.   On: 12/09/2014 18:28     ASSESSMENT: atrial fibrillation, now sinus bradycardia.    PLAN/DISCUSSION: Pt has had 24 hour holter monitor in past which showed HR down into 28 BPM range, but no pauses of symptoms, thus PPM was not warranted. Will check echo. Hold amiodarone and see how HR does overnight and will consider consulting Dr. Lavera Guise for potential PPM placement tomorrow if HR does not improve.    Patient and plan discussed with supervising provider, Dr. Neoma Laming, who agrees with above findings.   Kelby Fam [employer], [place] 12/10/2014 11:12 AM

## 2014-12-10 NOTE — Progress Notes (Signed)
Patient arrived to 2A Room 236. Patient denies pain and all questions answered. Patient oriented to unit and Fall Safety Plan signed. Skin assessment completed with Elna Breslow, RN. Nursing staff will continue to monitor. Earleen Reaper, RN

## 2014-12-10 NOTE — Progress Notes (Signed)
RN CALLED CARDIOLOGY CONSULT TO Sarah Dougherty ,[name] RE: BRADYCARDIA 40'S. MD FAMILIAR WITH PT AND EVAL SOON.

## 2014-12-10 NOTE — Progress Notes (Signed)
[facility] NAME: Sarah Dougherty    MR#:  [id number]  DATE OF BIRTH:  01-13-1922  SUBJECTIVE:  CHIEF COMPLAINT:   Chief Complaint  Patient presents with  . Fall   Complains of facial pain, nasal stuffiness and feeling that something is in her nose. Denies syncope presyncope orthostatic hypotension, dizziness or chest pain.  REVIEW OF SYSTEMS:   Review of Systems  Constitutional: Negative for fever.  Respiratory: Negative for shortness of breath.   Cardiovascular: Negative for chest pain and palpitations.  Gastrointestinal: Negative for nausea, vomiting and abdominal pain.  Genitourinary: Negative for dysuria.    DRUG ALLERGIES:   Allergies  Allergen Reactions  . Nitrofurantoin     Other reaction(s): Unknown  . Penicillins     Other reaction(s): Unknown  . Sulfa Antibiotics     Other reaction(s): Unknown  . Azithromycin Rash    VITALS:  Blood pressure 181/62, pulse 44, temperature 97.6 F (36.4 C), temperature source Oral, resp. rate 18, height 5' (1.524 m), weight 56.972 kg (125 lb 9.6 oz), SpO2 94 %.  PHYSICAL EXAMINATION:  GENERAL:  81 y.o.-year-old patient lying in the bed with no acute distress.  EYES: Right prosthetic eye, left pupil is reactive, extraocular motion intact HEENT: Significant facial trauma, nose is swollen and septum has swollen to the point of almost obstructing both nostrils, there is bruising over the forehead nose and both orbits NECK:  Supple, no jugular venous distention. No thyroid enlargement, no tenderness.  LUNGS: Normal breath sounds bilaterally, no wheezing, rales,rhonchi or crepitation. No use of accessory muscles of respiration.  CARDIOVASCULAR: S1, S2 normal. No murmurs, rubs, or gallops. Bradycardic ABDOMEN: Soft, nontender, nondistended. Bowel sounds present. No organomegaly or mass.  EXTREMITIES: No pedal edema, cyanosis, or clubbing. Left thumb is bandaged NEUROLOGIC:  Cranial nerves II through XII are intact. Muscle strength 5/5 in all extremities. Sensation intact. Gait not checked.  PSYCHIATRIC: The patient is alert and oriented x 3.  SKIN: No obvious rash, lesion, or ulcer. Bruising on the face as described above   LABORATORY PANEL:   CBC  Recent Labs Lab 12/09/14 1949  WBC 9.9  HGB 14.1  HCT 42.0  PLT 204   ------------------------------------------------------------------------------------------------------------------  Chemistries   Recent Labs Lab 12/09/14 1949  NA 142  K 4.0  CL 105  CO2 23  GLUCOSE 139*  BUN 28*  CREATININE 1.27*  CALCIUM 9.4   ------------------------------------------------------------------------------------------------------------------  Cardiac Enzymes No results for input(s): TROPONINI in the last 168 hours. ------------------------------------------------------------------------------------------------------------------  RADIOLOGY:  Ct Head Wo Contrast  12/09/2014   CLINICAL DATA:  Trip and fall injury, striking face and head. Patient takes blood thinners. No loss of consciousness. Small laceration to the lip. Swelling of the nose. Neck pain.  EXAM: CT HEAD WITHOUT CONTRAST  CT MAXILLOFACIAL WITHOUT CONTRAST  CT CERVICAL SPINE WITHOUT CONTRAST  TECHNIQUE: Multidetector CT imaging of the head, cervical spine, and maxillofacial structures were performed using the standard protocol without intravenous contrast. Multiplanar CT image reconstructions of the cervical spine and maxillofacial structures were also generated.  COMPARISON:  CT head 10/22/2011.  CT cervical spine 08/25/2011  FINDINGS: CT HEAD FINDINGS  Postoperative changes with left frontal and parietal craniotomies. Mild diffuse cerebral atrophy. Patchy low-attenuation changes in the deep white matter consistent with small vessel ischemia. Old right cerebellar infarct. No mass effect or midline shift. No abnormal extra-axial fluid collections.  Gray-white matter junctions are distinct. Basal cisterns are not effaced.  No evidence of acute intracranial hemorrhage. No depressed skull fractures. Mastoid air cells are not opacified. Vascular calcifications.  CT MAXILLOFACIAL FINDINGS  Right orbital prosthesis. Left globe and extraocular muscles appear intact. Mucosal thickening in the paranasal sinuses. No acute air-fluid levels. Congenital hypoaeration of the frontal sinuses. Mildly depressed fractures of the anterior nasal bones. Soft tissue swelling/ hematoma over the nose. The orbital rims, maxillary antral walls, zygomatic arches, pterygoid plates, mandibles, and temporomandibular joints appear intact. Multiple prior tooth extractions. Degenerative changes in the temporomandibular joints.  CT CERVICAL SPINE FINDINGS  Diffuse bone demineralization. Straightening of the usual cervical lordosis. This may be due to patient positioning but ligamentous injury or muscle spasm could also have this appearance and are not excluded. Degenerative changes throughout the cervical spine with narrowed interspaces and endplate hypertrophic changes. Degenerative changes throughout the facet joints. Bone encroachment upon bilateral neural foramina. Slight anterior subluxation of C7 on T1 is likely degenerative. Normal alignment of the facet joints. No vertebral compression deformities. No prevertebral soft tissue swelling.  IMPRESSION: No acute intracranial abnormalities. Chronic atrophy and small vessel ischemic changes. Old right cerebellar infarct.  Mild depression of anterior nasal bones with overlying soft tissue swelling. Orbital and facial bones otherwise intact.  Nonspecific straightening of the usual cervical lordosis. Diffuse bone demineralization. Diffuse degenerative changes. No acute displaced fractures identified.   Electronically Signed   By: Lucienne Capers M.D.   On: 12/09/2014 18:28   Ct Cervical Spine Wo Contrast  12/09/2014   CLINICAL DATA:  Trip and  fall injury, striking face and head. Patient takes blood thinners. No loss of consciousness. Small laceration to the lip. Swelling of the nose. Neck pain.  EXAM: CT HEAD WITHOUT CONTRAST  CT MAXILLOFACIAL WITHOUT CONTRAST  CT CERVICAL SPINE WITHOUT CONTRAST  TECHNIQUE: Multidetector CT imaging of the head, cervical spine, and maxillofacial structures were performed using the standard protocol without intravenous contrast. Multiplanar CT image reconstructions of the cervical spine and maxillofacial structures were also generated.  COMPARISON:  CT head 10/22/2011.  CT cervical spine 08/25/2011  FINDINGS: CT HEAD FINDINGS  Postoperative changes with left frontal and parietal craniotomies. Mild diffuse cerebral atrophy. Patchy low-attenuation changes in the deep white matter consistent with small vessel ischemia. Old right cerebellar infarct. No mass effect or midline shift. No abnormal extra-axial fluid collections. Gray-white matter junctions are distinct. Basal cisterns are not effaced. No evidence of acute intracranial hemorrhage. No depressed skull fractures. Mastoid air cells are not opacified. Vascular calcifications.  CT MAXILLOFACIAL FINDINGS  Right orbital prosthesis. Left globe and extraocular muscles appear intact. Mucosal thickening in the paranasal sinuses. No acute air-fluid levels. Congenital hypoaeration of the frontal sinuses. Mildly depressed fractures of the anterior nasal bones. Soft tissue swelling/ hematoma over the nose. The orbital rims, maxillary antral walls, zygomatic arches, pterygoid plates, mandibles, and temporomandibular joints appear intact. Multiple prior tooth extractions. Degenerative changes in the temporomandibular joints.  CT CERVICAL SPINE FINDINGS  Diffuse bone demineralization. Straightening of the usual cervical lordosis. This may be due to patient positioning but ligamentous injury or muscle spasm could also have this appearance and are not excluded. Degenerative changes  throughout the cervical spine with narrowed interspaces and endplate hypertrophic changes. Degenerative changes throughout the facet joints. Bone encroachment upon bilateral neural foramina. Slight anterior subluxation of C7 on T1 is likely degenerative. Normal alignment of the facet joints. No vertebral compression deformities. No prevertebral soft tissue swelling.  IMPRESSION: No acute intracranial abnormalities. Chronic atrophy and small vessel ischemic changes.  Old right cerebellar infarct.  Mild depression of anterior nasal bones with overlying soft tissue swelling. Orbital and facial bones otherwise intact.  Nonspecific straightening of the usual cervical lordosis. Diffuse bone demineralization. Diffuse degenerative changes. No acute displaced fractures identified.   Electronically Signed   By: Lucienne Capers M.D.   On: 12/09/2014 18:28   Ct Maxillofacial Wo Cm  12/09/2014   CLINICAL DATA:  Trip and fall injury, striking face and head. Patient takes blood thinners. No loss of consciousness. Small laceration to the lip. Swelling of the nose. Neck pain.  EXAM: CT HEAD WITHOUT CONTRAST  CT MAXILLOFACIAL WITHOUT CONTRAST  CT CERVICAL SPINE WITHOUT CONTRAST  TECHNIQUE: Multidetector CT imaging of the head, cervical spine, and maxillofacial structures were performed using the standard protocol without intravenous contrast. Multiplanar CT image reconstructions of the cervical spine and maxillofacial structures were also generated.  COMPARISON:  CT head 10/22/2011.  CT cervical spine 08/25/2011  FINDINGS: CT HEAD FINDINGS  Postoperative changes with left frontal and parietal craniotomies. Mild diffuse cerebral atrophy. Patchy low-attenuation changes in the deep white matter consistent with small vessel ischemia. Old right cerebellar infarct. No mass effect or midline shift. No abnormal extra-axial fluid collections. Gray-white matter junctions are distinct. Basal cisterns are not effaced. No evidence of acute  intracranial hemorrhage. No depressed skull fractures. Mastoid air cells are not opacified. Vascular calcifications.  CT MAXILLOFACIAL FINDINGS  Right orbital prosthesis. Left globe and extraocular muscles appear intact. Mucosal thickening in the paranasal sinuses. No acute air-fluid levels. Congenital hypoaeration of the frontal sinuses. Mildly depressed fractures of the anterior nasal bones. Soft tissue swelling/ hematoma over the nose. The orbital rims, maxillary antral walls, zygomatic arches, pterygoid plates, mandibles, and temporomandibular joints appear intact. Multiple prior tooth extractions. Degenerative changes in the temporomandibular joints.  CT CERVICAL SPINE FINDINGS  Diffuse bone demineralization. Straightening of the usual cervical lordosis. This may be due to patient positioning but ligamentous injury or muscle spasm could also have this appearance and are not excluded. Degenerative changes throughout the cervical spine with narrowed interspaces and endplate hypertrophic changes. Degenerative changes throughout the facet joints. Bone encroachment upon bilateral neural foramina. Slight anterior subluxation of C7 on T1 is likely degenerative. Normal alignment of the facet joints. No vertebral compression deformities. No prevertebral soft tissue swelling.  IMPRESSION: No acute intracranial abnormalities. Chronic atrophy and small vessel ischemic changes. Old right cerebellar infarct.  Mild depression of anterior nasal bones with overlying soft tissue swelling. Orbital and facial bones otherwise intact.  Nonspecific straightening of the usual cervical lordosis. Diffuse bone demineralization. Diffuse degenerative changes. No acute displaced fractures identified.   Electronically Signed   By: Lucienne Capers M.D.   On: 12/09/2014 18:28    EKG:   Orders placed or performed during the hospital encounter of 12/09/14  . EKG 12-Lead  . EKG 12-Lead    ASSESSMENT AND PLAN:   #1 COPD, oxygen  dependent - Continue supplemental oxygenation, stable - Continue home medications  #2 bradycardia - Appreciate cardiology consultation - Holding medications and observing to evaluate for possible pacemaker need - Fall history seems mechanical rather than due to syncope or other cardiac event - Transfer to telemetry, agree with echo  #3 hypertension - Continue Norvasc  #4 facial and nasal swelling: No fracture noted on CT, continue ice this will improve with time  #5 diabetes - Continue home medications and sliding scale  #6 prophylaxis - Holding anticoagulation due to significant facial swelling and bruising, place SCDs  All the records are reviewed and case discussed with Care Management/Social Workerr. Management plans discussed with the patient, her son and they are in agreement. Case discussed with cardiology  CODE STATUS: Full  TOTAL TIME TAKING CARE OF THIS PATIENT: 30 minutes.  Greater than 50% of time spent in care coordination and counseling. POSSIBLE D/C IN 2-4 DAYS, DEPENDING ON CLINICAL CONDITION.   Myrtis Ser M.D on 12/10/2014 at 1:47 PM  Between 7am to 6pm - Pager - [phone]  After 6pm go to [email or web address] - password [email or web address] [facility]  Office  [phone]  CC: Primary care physician; Perrin Maltese, MD

## 2014-12-10 NOTE — Progress Notes (Signed)
Right eye prothesis

## 2014-12-10 NOTE — Progress Notes (Signed)
Pt had gauze dressings in place on R wrist and L thumb. Dressings changed per pt request. Rachael Fee, RN

## 2014-12-10 NOTE — Progress Notes (Addendum)
Patient on tele & bradycardic this morning. Gave patient her 10am meds but held amiodarone per Jasmine Pang, Dr. Marella Bile PA.  Report called to [facility] on 2A. Patient will transfer to 2A.

## 2014-12-10 NOTE — Progress Notes (Signed)
Contacted dr hower concerning B/P  And pulse continue to monitor . Patient alert and oriented x 4  Wears o2 at night r/t hx of copd. [place] eye effect from fall to face with nasal fx face black and blue with edema around mouth eyes and nose. States it is difficult to breathe so mouth breathing is apparent at times. Uses saline nasal spray for comfort and hopefully decreases  swelling. Patient denies pain and only will take tylenol if  Encouraged.

## 2014-12-11 ENCOUNTER — Inpatient Hospital Stay: Payer: Medicare HMO

## 2014-12-11 DIAGNOSIS — R001 Bradycardia, unspecified: Secondary | ICD-10-CM | POA: Diagnosis not present

## 2014-12-11 LAB — BLOOD GAS, ARTERIAL
ALLENS TEST (PASS/FAIL): POSITIVE — AB
Acid-base deficit: 0.4 mmol/L (ref 0.0–2.0)
BICARBONATE: 22.9 meq/L (ref 21.0–28.0)
FIO2: 0.21
O2 Saturation: 93.4 %
PATIENT TEMPERATURE: 37
PH ART: 7.45 (ref 7.350–7.450)
pCO2 arterial: 33 mmHg (ref 32.0–48.0)
pO2, Arterial: 65 mmHg — ABNORMAL LOW (ref 83.0–108.0)

## 2014-12-11 LAB — URINALYSIS COMPLETE WITH MICROSCOPIC (ARMC ONLY)
BILIRUBIN URINE: NEGATIVE
GLUCOSE, UA: NEGATIVE mg/dL
KETONES UR: NEGATIVE mg/dL
NITRITE: NEGATIVE
PH: 5 (ref 5.0–8.0)
Protein, ur: 30 mg/dL — AB
SPECIFIC GRAVITY, URINE: 1.016 (ref 1.005–1.030)
Trans Epithel, UA: 2

## 2014-12-11 LAB — COMPREHENSIVE METABOLIC PANEL
ALBUMIN: 3.8 g/dL (ref 3.5–5.0)
ALK PHOS: 73 U/L (ref 38–126)
ALT: 11 U/L — ABNORMAL LOW (ref 14–54)
ANION GAP: 9 (ref 5–15)
AST: 24 U/L (ref 15–41)
BILIRUBIN TOTAL: 1.7 mg/dL — AB (ref 0.3–1.2)
BUN: 29 mg/dL — ABNORMAL HIGH (ref 6–20)
CALCIUM: 8.8 mg/dL — AB (ref 8.9–10.3)
CO2: 22 mmol/L (ref 22–32)
Chloride: 106 mmol/L (ref 101–111)
Creatinine, Ser: 1.24 mg/dL — ABNORMAL HIGH (ref 0.44–1.00)
GFR calc non Af Amer: 37 mL/min — ABNORMAL LOW (ref >60)
GFR, EST AFRICAN AMERICAN: 43 mL/min — AB (ref >60)
GLUCOSE: 147 mg/dL — AB (ref 65–99)
POTASSIUM: 3.9 mmol/L (ref 3.5–5.1)
SODIUM: 137 mmol/L (ref 135–145)
TOTAL PROTEIN: 6.7 g/dL (ref 6.5–8.1)

## 2014-12-11 LAB — GLUCOSE, CAPILLARY
GLUCOSE-CAPILLARY: 141 mg/dL — AB (ref 65–99)
GLUCOSE-CAPILLARY: 204 mg/dL — AB (ref 65–99)
GLUCOSE-CAPILLARY: 156 mg/dL — AB (ref 65–99)
Glucose-Capillary: 115 mg/dL — ABNORMAL HIGH (ref 65–99)

## 2014-12-11 LAB — CBC
HCT: 41.7 % (ref 35.0–47.0)
HCT: 43.2 % (ref 35.0–47.0)
Hemoglobin: 13.9 g/dL (ref 12.0–16.0)
Hemoglobin: 14.4 g/dL (ref 12.0–16.0)
MCH: 30.4 pg (ref 26.0–34.0)
MCH: 30 pg (ref 26.0–34.0)
MCHC: 33.4 g/dL (ref 32.0–36.0)
MCHC: 33.4 g/dL (ref 32.0–36.0)
MCV: 90.8 fL (ref 80.0–100.0)
MCV: 89.9 fL (ref 80.0–100.0)
PLATELETS: 209 10*3/uL (ref 150–440)
Platelets: 227 10*3/uL (ref 150–440)
RBC: 4.59 MIL/uL (ref 3.80–5.20)
RBC: 4.8 MIL/uL (ref 3.80–5.20)
RDW: 14.9 % — ABNORMAL HIGH (ref 11.5–14.5)
RDW: 14.8 % — AB (ref 11.5–14.5)
WBC: 7.9 10*3/uL (ref 3.6–11.0)
WBC: 9.7 10*3/uL (ref 3.6–11.0)

## 2014-12-11 LAB — PROTIME-INR
INR: 1.06
Prothrombin Time: 14 seconds (ref 11.4–15.0)

## 2014-12-11 LAB — BASIC METABOLIC PANEL
Anion gap: 7 (ref 5–15)
BUN: 26 mg/dL — AB (ref 6–20)
CO2: 27 mmol/L (ref 22–32)
CREATININE: 1.28 mg/dL — AB (ref 0.44–1.00)
Calcium: 8.6 mg/dL — ABNORMAL LOW (ref 8.9–10.3)
Chloride: 106 mmol/L (ref 101–111)
GFR calc Af Amer: 41 mL/min — ABNORMAL LOW (ref >60)
GFR, EST NON AFRICAN AMERICAN: 35 mL/min — AB (ref >60)
Glucose, Bld: 156 mg/dL — ABNORMAL HIGH (ref 65–99)
Potassium: 3.9 mmol/L (ref 3.5–5.1)
SODIUM: 140 mmol/L (ref 135–145)

## 2014-12-11 LAB — TYPE AND SCREEN
ABO/RH(D): O POS
Antibody Screen: NEGATIVE

## 2014-12-11 LAB — APTT: APTT: 29 s (ref 24–36)

## 2014-12-11 MED ORDER — VANCOMYCIN HCL IN DEXTROSE 1-5 GM/200ML-% IV SOLN
1000.0000 mg | INTRAVENOUS | Status: AC
  Administered 2014-12-12: 1000 mg via INTRAVENOUS
  Filled 2014-12-11: qty 200

## 2014-12-11 NOTE — Progress Notes (Signed)
I had a lengthy discussion with the patient and her son about having pacemaker implanted. She had been on amiodarone by mouth which was stopped on admission. She has intermittent atrial fibrillation in the past. I think that even though she tripped and fell and has bruises on the face this could be due to decreased perfusion of the brain. She however is refusing to have pacemaker implanted at this time. Her heart rate lowest one was 31 during night and right now is 50 but no pauses.

## 2014-12-11 NOTE — Progress Notes (Addendum)
   SUBJECTIVE: Pt states she is feeling well, less congested, no dizziness or pre-syncope when ambulating, no CP or SOB     Filed Vitals:   12/10/14 1111 12/10/14 1401 12/10/14 1937 12/11/14 0431  BP: 181/62 169/53 166/60 142/50  Pulse: 44 38 41 37  Temp: 97.6 F (36.4 C)  97.8 F (36.6 C) 97.6 F (36.4 C)  TempSrc: Oral  Oral   Resp: 18  16 16   Height:      Weight:      SpO2: 94% 91% 93% 91%    Intake/Output Summary (Last 24 hours) at 12/11/14 1000 Last data filed at 12/11/14 0900  Gross per 24 hour  Intake    240 ml  Output    850 ml  Net   -610 ml    LABS: Basic Metabolic Panel:  Recent Labs  12/09/14 1949 12/11/14 0427  NA 142 140  K 4.0 3.9  CL 105 106  CO2 23 27  GLUCOSE 139* 156*  BUN 28* 26*  CREATININE 1.27* 1.28*  CALCIUM 9.4 8.6*   Liver Function Tests: No results for input(s): AST, ALT, ALKPHOS, BILITOT, PROT, ALBUMIN in the last 72 hours. No results for input(s): LIPASE, AMYLASE in the last 72 hours. CBC:  Recent Labs  12/09/14 1949 12/11/14 0427  WBC 9.9 7.9  NEUTROABS 7.0*  --   HGB 14.1 13.9  HCT 42.0 41.7  MCV 90.3 90.8  PLT 204 209   Cardiac Enzymes: No results for input(s): CKTOTAL, CKMB, CKMBINDEX, TROPONINI in the last 72 hours. BNP: Invalid input(s): POCBNP D-Dimer: No results for input(s): DDIMER in the last 72 hours. Hemoglobin A1C: No results for input(s): HGBA1C in the last 72 hours. Fasting Lipid Panel: No results for input(s): CHOL, HDL, LDLCALC, TRIG, CHOLHDL, LDLDIRECT in the last 72 hours. Thyroid Function Tests: No results for input(s): TSH, T4TOTAL, T3FREE, THYROIDAB in the last 72 hours.  Invalid input(s): FREET3 Anemia Panel: No results for input(s): VITAMINB12, FOLATE, FERRITIN, TIBC, IRON, RETICCTPCT in the last 72 hours.   PHYSICAL EXAM General: Well developed, well nourished, in no acute distress HEENT:  diffuse ecchymosis on face Neck:  No JVD.  Lungs: Clear bilaterally to auscultation and  percussion. Heart: sinus bradycardia  Abdomen: Bowel sounds are positive, abdomen soft and non-tender  Msk:  Back normal, normal gait. Normal strength and tone for age. Extremities: No clubbing, cyanosis or edema.   Neuro: Alert and oriented X 3. Psych:  Good affect, responds appropriately  TELEMETRY: Reviewed telemetry pt in: sinus bradycardia 40 BPM  ASSESSMENT AND PLAN: amio held yesterday, HR in 30s overnight, no pauses > 2 seconds. Asymptomatic, will not consult for PPM at this time. Echo stable.    Patient and plan discussed with supervising provider, Dr. Neoma Laming, who agrees with above findings.   Kelby Fam Clayton, [place]  12/11/2014 10:00 AM     Addendum 12/11/14 10:20am: upon further discussion with Dr. Humphrey Rolls, he does feel pt is a candidate for PPM as HR was in 39s. Will consult Dr. Lavera Guise.

## 2014-12-11 NOTE — Progress Notes (Signed)
[facility] [place] NAME: Sarah Dougherty    MR#:  [id number]  DATE OF BIRTH:  February 28, 1922  SUBJECTIVE:  CHIEF COMPLAINT:   Chief Complaint  Patient presents with  . Fall   Facial pain and swelling are improving. Continues to be significantly bradycardic but asymptomatic.  REVIEW OF SYSTEMS:   Review of Systems  Constitutional: Negative for fever.  Respiratory: Negative for shortness of breath.   Cardiovascular: Negative for chest pain and palpitations.  Gastrointestinal: Negative for nausea, vomiting and abdominal pain.  Genitourinary: Negative for dysuria.    DRUG ALLERGIES:   Allergies  Allergen Reactions  . Nitrofurantoin     Other reaction(s): Unknown  . Penicillins     Other reaction(s): Unknown  . Sulfa Antibiotics     Other reaction(s): Unknown  . Azithromycin Rash    VITALS:  Blood pressure 139/48, pulse 40, temperature 98.3 F (36.8 C), temperature source Oral, resp. rate 16, height 5' (1.524 m), weight 56.972 kg (125 lb 9.6 oz), SpO2 93 %.  PHYSICAL EXAMINATION:  GENERAL:  81 y.o.-year-old patient sitting in chair with no acute distress.  EYES: Right prosthetic eye, left pupil is reactive, extraocular motion intact HEENT: Significant facial trauma, nose is swollen and septum has swollen to the point of almost obstructing both nostrils, there is bruising over the forehead nose and both orbits NECK:  Supple, no jugular venous distention. No thyroid enlargement, no tenderness.  LUNGS: Normal breath sounds bilaterally, no wheezing, rales,rhonchi or crepitation. No use of accessory muscles of respiration.  CARDIOVASCULAR: S1, S2 normal. No murmurs, rubs, or gallops. Bradycardic ABDOMEN: Soft, nontender, nondistended. Bowel sounds present. No organomegaly or mass.  EXTREMITIES: No pedal edema, cyanosis, or clubbing. Left thumb is bandaged NEUROLOGIC: Cranial nerves II through XII are intact. Muscle strength 5/5 in  all extremities. Sensation intact. Gait not checked.  PSYCHIATRIC: The patient is alert and oriented x 3.  SKIN: No obvious rash, lesion, or ulcer. Bruising on the face as described above   LABORATORY PANEL:   CBC  Recent Labs Lab 12/11/14 0427  WBC 7.9  HGB 13.9  HCT 41.7  PLT 209   ------------------------------------------------------------------------------------------------------------------  Chemistries   Recent Labs Lab 12/11/14 0427  NA 140  K 3.9  CL 106  CO2 27  GLUCOSE 156*  BUN 26*  CREATININE 1.28*  CALCIUM 8.6*   ------------------------------------------------------------------------------------------------------------------  Cardiac Enzymes No results for input(s): TROPONINI in the last 168 hours. ------------------------------------------------------------------------------------------------------------------  RADIOLOGY:  Ct Head Wo Contrast  12/09/2014   CLINICAL DATA:  Trip and fall injury, striking face and head. Patient takes blood thinners. No loss of consciousness. Small laceration to the lip. Swelling of the nose. Neck pain.  EXAM: CT HEAD WITHOUT CONTRAST  CT MAXILLOFACIAL WITHOUT CONTRAST  CT CERVICAL SPINE WITHOUT CONTRAST  TECHNIQUE: Multidetector CT imaging of the head, cervical spine, and maxillofacial structures were performed using the standard protocol without intravenous contrast. Multiplanar CT image reconstructions of the cervical spine and maxillofacial structures were also generated.  COMPARISON:  CT head 10/22/2011.  CT cervical spine 08/25/2011  FINDINGS: CT HEAD FINDINGS  Postoperative changes with left frontal and parietal craniotomies. Mild diffuse cerebral atrophy. Patchy low-attenuation changes in the deep white matter consistent with small vessel ischemia. Old right cerebellar infarct. No mass effect or midline shift. No abnormal extra-axial fluid collections. Gray-white matter junctions are distinct. Basal cisterns are not  effaced. No evidence of acute intracranial hemorrhage. No depressed skull fractures. Mastoid  air cells are not opacified. Vascular calcifications.  CT MAXILLOFACIAL FINDINGS  Right orbital prosthesis. Left globe and extraocular muscles appear intact. Mucosal thickening in the paranasal sinuses. No acute air-fluid levels. Congenital hypoaeration of the frontal sinuses. Mildly depressed fractures of the anterior nasal bones. Soft tissue swelling/ hematoma over the nose. The orbital rims, maxillary antral walls, zygomatic arches, pterygoid plates, mandibles, and temporomandibular joints appear intact. Multiple prior tooth extractions. Degenerative changes in the temporomandibular joints.  CT CERVICAL SPINE FINDINGS  Diffuse bone demineralization. Straightening of the usual cervical lordosis. This may be due to patient positioning but ligamentous injury or muscle spasm could also have this appearance and are not excluded. Degenerative changes throughout the cervical spine with narrowed interspaces and endplate hypertrophic changes. Degenerative changes throughout the facet joints. Bone encroachment upon bilateral neural foramina. Slight anterior subluxation of C7 on T1 is likely degenerative. Normal alignment of the facet joints. No vertebral compression deformities. No prevertebral soft tissue swelling.  IMPRESSION: No acute intracranial abnormalities. Chronic atrophy and small vessel ischemic changes. Old right cerebellar infarct.  Mild depression of anterior nasal bones with overlying soft tissue swelling. Orbital and facial bones otherwise intact.  Nonspecific straightening of the usual cervical lordosis. Diffuse bone demineralization. Diffuse degenerative changes. No acute displaced fractures identified.   Electronically Signed   By: Lucienne Capers M.D.   On: 12/09/2014 18:28   Ct Cervical Spine Wo Contrast  12/09/2014   CLINICAL DATA:  Trip and fall injury, striking face and head. Patient takes blood  thinners. No loss of consciousness. Small laceration to the lip. Swelling of the nose. Neck pain.  EXAM: CT HEAD WITHOUT CONTRAST  CT MAXILLOFACIAL WITHOUT CONTRAST  CT CERVICAL SPINE WITHOUT CONTRAST  TECHNIQUE: Multidetector CT imaging of the head, cervical spine, and maxillofacial structures were performed using the standard protocol without intravenous contrast. Multiplanar CT image reconstructions of the cervical spine and maxillofacial structures were also generated.  COMPARISON:  CT head 10/22/2011.  CT cervical spine 08/25/2011  FINDINGS: CT HEAD FINDINGS  Postoperative changes with left frontal and parietal craniotomies. Mild diffuse cerebral atrophy. Patchy low-attenuation changes in the deep white matter consistent with small vessel ischemia. Old right cerebellar infarct. No mass effect or midline shift. No abnormal extra-axial fluid collections. Gray-white matter junctions are distinct. Basal cisterns are not effaced. No evidence of acute intracranial hemorrhage. No depressed skull fractures. Mastoid air cells are not opacified. Vascular calcifications.  CT MAXILLOFACIAL FINDINGS  Right orbital prosthesis. Left globe and extraocular muscles appear intact. Mucosal thickening in the paranasal sinuses. No acute air-fluid levels. Congenital hypoaeration of the frontal sinuses. Mildly depressed fractures of the anterior nasal bones. Soft tissue swelling/ hematoma over the nose. The orbital rims, maxillary antral walls, zygomatic arches, pterygoid plates, mandibles, and temporomandibular joints appear intact. Multiple prior tooth extractions. Degenerative changes in the temporomandibular joints.  CT CERVICAL SPINE FINDINGS  Diffuse bone demineralization. Straightening of the usual cervical lordosis. This may be due to patient positioning but ligamentous injury or muscle spasm could also have this appearance and are not excluded. Degenerative changes throughout the cervical spine with narrowed interspaces and  endplate hypertrophic changes. Degenerative changes throughout the facet joints. Bone encroachment upon bilateral neural foramina. Slight anterior subluxation of C7 on T1 is likely degenerative. Normal alignment of the facet joints. No vertebral compression deformities. No prevertebral soft tissue swelling.  IMPRESSION: No acute intracranial abnormalities. Chronic atrophy and small vessel ischemic changes. Old right cerebellar infarct.  Mild depression of anterior nasal bones  with overlying soft tissue swelling. Orbital and facial bones otherwise intact.  Nonspecific straightening of the usual cervical lordosis. Diffuse bone demineralization. Diffuse degenerative changes. No acute displaced fractures identified.   Electronically Signed   By: Lucienne Capers M.D.   On: 12/09/2014 18:28   Ct Maxillofacial Wo Cm  12/09/2014   CLINICAL DATA:  Trip and fall injury, striking face and head. Patient takes blood thinners. No loss of consciousness. Small laceration to the lip. Swelling of the nose. Neck pain.  EXAM: CT HEAD WITHOUT CONTRAST  CT MAXILLOFACIAL WITHOUT CONTRAST  CT CERVICAL SPINE WITHOUT CONTRAST  TECHNIQUE: Multidetector CT imaging of the head, cervical spine, and maxillofacial structures were performed using the standard protocol without intravenous contrast. Multiplanar CT image reconstructions of the cervical spine and maxillofacial structures were also generated.  COMPARISON:  CT head 10/22/2011.  CT cervical spine 08/25/2011  FINDINGS: CT HEAD FINDINGS  Postoperative changes with left frontal and parietal craniotomies. Mild diffuse cerebral atrophy. Patchy low-attenuation changes in the deep white matter consistent with small vessel ischemia. Old right cerebellar infarct. No mass effect or midline shift. No abnormal extra-axial fluid collections. Gray-white matter junctions are distinct. Basal cisterns are not effaced. No evidence of acute intracranial hemorrhage. No depressed skull fractures. Mastoid  air cells are not opacified. Vascular calcifications.  CT MAXILLOFACIAL FINDINGS  Right orbital prosthesis. Left globe and extraocular muscles appear intact. Mucosal thickening in the paranasal sinuses. No acute air-fluid levels. Congenital hypoaeration of the frontal sinuses. Mildly depressed fractures of the anterior nasal bones. Soft tissue swelling/ hematoma over the nose. The orbital rims, maxillary antral walls, zygomatic arches, pterygoid plates, mandibles, and temporomandibular joints appear intact. Multiple prior tooth extractions. Degenerative changes in the temporomandibular joints.  CT CERVICAL SPINE FINDINGS  Diffuse bone demineralization. Straightening of the usual cervical lordosis. This may be due to patient positioning but ligamentous injury or muscle spasm could also have this appearance and are not excluded. Degenerative changes throughout the cervical spine with narrowed interspaces and endplate hypertrophic changes. Degenerative changes throughout the facet joints. Bone encroachment upon bilateral neural foramina. Slight anterior subluxation of C7 on T1 is likely degenerative. Normal alignment of the facet joints. No vertebral compression deformities. No prevertebral soft tissue swelling.  IMPRESSION: No acute intracranial abnormalities. Chronic atrophy and small vessel ischemic changes. Old right cerebellar infarct.  Mild depression of anterior nasal bones with overlying soft tissue swelling. Orbital and facial bones otherwise intact.  Nonspecific straightening of the usual cervical lordosis. Diffuse bone demineralization. Diffuse degenerative changes. No acute displaced fractures identified.   Electronically Signed   By: Lucienne Capers M.D.   On: 12/09/2014 18:28    EKG:   Orders placed or performed during the hospital encounter of 12/09/14  . EKG 12-Lead  . EKG 12-Lead    ASSESSMENT AND PLAN:   #1 COPD, oxygen dependent, no exacerbation - Continue supplemental oxygenation,  stable - Continue home medications  #2 bradycardia - Appreciate cardiology consultation - Holding medications and observing to evaluate for possible pacemaker need, as heart rate has not increased at all it may be necessary to place pacemaker. Dr. Chancy Milroy has discussed this with the patient and she is discussing with her family. - Fall history seems mechanical rather than due to syncope or other cardiac event, though difficult to tell - Continue to monitor on telemetry, echo shows normal ejection fraction, grade 1 diastolic dysfunction  #3 hypertension - Continue Norvasc  #4 facial and nasal swelling: Improving. No fracture noted on  CT, continue ice.   #5 diabetes - Continue home medications and sliding scale  #6 prophylaxis - Holding anticoagulation due to significant facial swelling and bruising, place SCDs  All the records are reviewed and case discussed with Care Management/Social Workerr. Management plans discussed with the patient, her son and daughter-in-law and they are in agreement. Case discussed with cardiology  CODE STATUS: Full  TOTAL TIME TAKING CARE OF THIS PATIENT: 30 minutes.  Greater than 50% of time spent in care coordination and counseling. POSSIBLE D/C IN 2-4 DAYS, DEPENDING ON CLINICAL CONDITION.   Myrtis Ser M.D on 12/11/2014 at 12:49 PM  Between 7am to 6pm - Pager - [phone]  After 6pm go to [email or web address] - password [email or web address] [facility]  Office  [phone]  CC: Primary care physician; Perrin Maltese, MD

## 2014-12-11 NOTE — Progress Notes (Signed)
PT Cancellation Note  Patient Details Name: Sarah Dougherty MRN: [id number] DOB: 29-Apr-1921   Cancelled Treatment:    Reason Eval/Treat Not Completed: Medical issues which prohibited therapy.  Nursing reports pt currently discussing having pacemaker implanted; will hold PT at this time and will need new PT consult post pacemaker implantation.   Raquel Sarna Tanvir Hipple 12/11/2014, 1:28 PM Leitha Bleak, [place]

## 2014-12-11 NOTE — Care Management Note (Signed)
Case Management Note  Patient Details  Name: Sarah Dougherty MRN: [id number] Date of Birth: 1922-01-11  Subjective/Objective:    81yo Ms [name] was admitted 12/09/14 after a fall at home resulting in a severely swollen face and nose. Hx; COPD. [name] reports that she tripped on a step and apparently landed face first. Her entire nose remain swollen with red and purple bruising today. PCP=Dr Neelan. Pharmacy=[facility]. Assistive equipment=a rolling walker, cane, shower chair. Uses 2L N/Cat night only supplied by [employer]. Resides with her son who is currently sitting at her bedside. Son Antony Haste reports that after [name] last hospitalization "I turned the home health people away at the door because I look after my Mother." Son Antony Haste agreed to consider home health services after discharge and chose [facility] to be the provider. Son provides transportation and Ms Steedman reports that she also drives short distances. Son reports "My Mother has bad knees but Dr Rudene Christians will not operate." Anticipate home with home health. Case management will follow for discharge planning.                 Action/Plan:   Expected Discharge Date:  12/10/14               Expected Discharge Plan:  [place]  In-House Referral:     Discharge planning Services  CM Consult  Post Acute Care Choice:    Choice offered to:  Adult Children  DME Arranged:  N/A DME Agency:  NA  HH Arranged:  RN, PT HH Agency:   ([facility] chosen by Ms Juliet Rude son [name].)  Status of Service:  In process, will continue to follow  Medicare Important Message Given:    Date Medicare IM Given:    Medicare IM give by:    Date Additional Medicare IM Given:    Additional Medicare Important Message give by:     If discussed at [place] of Stay Meetings, dates discussed:    Additional Comments:  Masaji Billups A, RN 12/11/2014, 11:45 AM

## 2014-12-11 NOTE — Progress Notes (Signed)
Per Dr. Humphrey Rolls, if patient decides she wants pacemaker, go ahead and call Dr. Clayborn Bigness to come see the patient later today. However, if she does not make any decisions today, wait and let Dr. Clayborn Bigness or Dr. Saralyn Pilar round tomorrow.

## 2014-12-11 NOTE — Progress Notes (Signed)
Per Dr. Humphrey Rolls, consult Dr. Lavera Guise (routine) for bradycardia 30's, possible pacemaker candidate. Would like him to see patient today

## 2014-12-11 NOTE — Progress Notes (Signed)
Dr. Lavera Guise out of town. Dr. Humphrey Rolls notified, order to call Dr. Saralyn Pilar' group for consult regarding possible pacemaker. Dr Saralyn Pilar not on call, so Dr. Clayborn Bigness was notified. Instructed to notify their office so she can be put on their list.

## 2014-12-11 NOTE — Progress Notes (Signed)
Pt is now saying she would like to have the pacemaker after discussing with her children. Dr. Clayborn Bigness on the unit - notified. MD to speak with patient and son.

## 2014-12-12 ENCOUNTER — Inpatient Hospital Stay: Payer: Medicare HMO | Admitting: Anesthesiology

## 2014-12-12 ENCOUNTER — Encounter
Admission: EM | Disposition: A | Discharge status: Home / Self Care | Payer: Self-pay | Source: Home / Self Care | Attending: Internal Medicine

## 2014-12-12 ENCOUNTER — Inpatient Hospital Stay: Payer: Medicare HMO

## 2014-12-12 HISTORY — PX: PACEMAKER INSERTION: SHX728

## 2014-12-12 LAB — CBC
HCT: 43.8 % (ref 35.0–47.0)
HEMOGLOBIN: 14.7 g/dL (ref 12.0–16.0)
MCH: 30.5 pg (ref 26.0–34.0)
MCHC: 33.6 g/dL (ref 32.0–36.0)
MCV: 90.7 fL (ref 80.0–100.0)
Platelets: 225 10*3/uL (ref 150–440)
RBC: 4.83 MIL/uL (ref 3.80–5.20)
RDW: 14.7 % — ABNORMAL HIGH (ref 11.5–14.5)
WBC: 8.9 10*3/uL (ref 3.6–11.0)

## 2014-12-12 LAB — BASIC METABOLIC PANEL
Anion gap: 7 (ref 5–15)
BUN: 31 mg/dL — AB (ref 6–20)
CHLORIDE: 106 mmol/L (ref 101–111)
CO2: 27 mmol/L (ref 22–32)
CREATININE: 1.22 mg/dL — AB (ref 0.44–1.00)
Calcium: 9 mg/dL (ref 8.9–10.3)
GFR calc Af Amer: 44 mL/min — ABNORMAL LOW (ref >60)
GFR calc non Af Amer: 38 mL/min — ABNORMAL LOW (ref >60)
GLUCOSE: 154 mg/dL — AB (ref 65–99)
Potassium: 4.2 mmol/L (ref 3.5–5.1)
SODIUM: 140 mmol/L (ref 135–145)

## 2014-12-12 LAB — GLUCOSE, CAPILLARY
Glucose-Capillary: 156 mg/dL — ABNORMAL HIGH (ref 65–99)
Glucose-Capillary: 135 mg/dL — ABNORMAL HIGH (ref 65–99)
Glucose-Capillary: 180 mg/dL — ABNORMAL HIGH (ref 65–99)
Glucose-Capillary: 179 mg/dL — ABNORMAL HIGH (ref 65–99)

## 2014-12-12 LAB — ABO/RH: ABO/RH(D): O POS

## 2014-12-12 SURGERY — INSERTION, CARDIAC PACEMAKER
Anesthesia: General | Laterality: Left

## 2014-12-12 MED ORDER — IOPAMIDOL (ISOVUE-300) INJECTION 61%
INTRAVENOUS | Status: DC | PRN
  Administered 2014-12-12: 20 mL via INTRAVENOUS

## 2014-12-12 MED ORDER — SODIUM CHLORIDE 0.9 % IR SOLN
Status: DC | PRN
  Administered 2014-12-12: 400 mL

## 2014-12-12 MED ORDER — LIDOCAINE 1 % OPTIME INJ - NO CHARGE
INTRAMUSCULAR | Status: DC | PRN
  Administered 2014-12-12: 28 mL

## 2014-12-12 MED ORDER — ACETAMINOPHEN 325 MG PO TABS
325.0000 mg | ORAL_TABLET | ORAL | Status: DC | PRN
  Administered 2014-12-12 – 2014-12-13 (×2): 650 mg via ORAL
  Filled 2014-12-12 (×2): qty 2

## 2014-12-12 MED ORDER — PROPOFOL INFUSION 10 MG/ML OPTIME
INTRAVENOUS | Status: DC | PRN
  Administered 2014-12-12: 40 ug/kg/min via INTRAVENOUS

## 2014-12-12 MED ORDER — FENTANYL CITRATE (PF) 100 MCG/2ML IJ SOLN: 25.0000 ug | INTRAMUSCULAR | Status: DC | PRN

## 2014-12-12 MED ORDER — SODIUM CHLORIDE 0.9 % IJ SOLN
INTRAMUSCULAR | Status: DC | PRN
  Administered 2014-12-12: 2 mL

## 2014-12-12 MED ORDER — MIDAZOLAM HCL 2 MG/2ML IJ SOLN
INTRAMUSCULAR | Status: DC | PRN
  Administered 2014-12-12 (×2): 1 mg via INTRAVENOUS

## 2014-12-12 MED ORDER — EPHEDRINE SULFATE 50 MG/ML IJ SOLN
INTRAMUSCULAR | Status: DC | PRN
  Administered 2014-12-12: 10 mg via INTRAVENOUS

## 2014-12-12 MED ORDER — VANCOMYCIN HCL IN DEXTROSE 1-5 GM/200ML-% IV SOLN
1000.0000 mg | Freq: Two times a day (BID) | INTRAVENOUS | Status: AC
  Administered 2014-12-12: 1000 mg via INTRAVENOUS
  Filled 2014-12-12: qty 200

## 2014-12-12 MED ORDER — ONDANSETRON HCL 4 MG/2ML IJ SOLN: 4.0000 mg | Freq: Once | INTRAMUSCULAR | Status: DC | PRN

## 2014-12-12 MED ORDER — DEXTROSE 5 % IV SOLN
1.0000 g | INTRAVENOUS | Status: DC
  Administered 2014-12-12 – 2014-12-13 (×2): 1 g via INTRAVENOUS
  Filled 2014-12-12 (×3): qty 10

## 2014-12-12 MED ORDER — KETAMINE HCL 50 MG/ML IJ SOLN
INTRAMUSCULAR | Status: DC | PRN
  Administered 2014-12-12: 25 mg via INTRAMUSCULAR

## 2014-12-12 MED ORDER — ONDANSETRON HCL 4 MG/2ML IJ SOLN: 4.0000 mg | Freq: Four times a day (QID) | INTRAMUSCULAR | Status: DC | PRN

## 2014-12-12 SURGICAL SUPPLY — 34 items
BAG DECANTER FOR FLEXI CONT (MISCELLANEOUS) IMPLANT
BRUSH SCRUB 4% CHG (MISCELLANEOUS) ×2 IMPLANT
CABLE SURG 12 DISP A/V CHANNEL (MISCELLANEOUS) ×2 IMPLANT
CANISTER SUCT 1200ML W/VALVE (MISCELLANEOUS) ×2 IMPLANT
CHLORAPREP W/TINT 26ML (MISCELLANEOUS) ×2 IMPLANT
COVER LIGHT HANDLE STERIS (MISCELLANEOUS) ×4 IMPLANT
COVER MAYO STAND STRL (DRAPES) ×2 IMPLANT
DRAPE C-ARM XRAY 36X54 (DRAPES) ×2 IMPLANT
DRESSING TELFA 4X3 1S ST N-ADH (GAUZE/BANDAGES/DRESSINGS) ×2 IMPLANT
DRSG TEGADERM 4X4.75 (GAUZE/BANDAGES/DRESSINGS) ×2 IMPLANT
GLOVE BIO SURGEON STRL SZ7.5 (GLOVE) ×2 IMPLANT
GLOVE BIO SURGEON STRL SZ8 (GLOVE) ×2 IMPLANT
GOWN STRL REUS W/ TWL LRG LVL3 (GOWN DISPOSABLE) ×1 IMPLANT
GOWN STRL REUS W/ TWL XL LVL3 (GOWN DISPOSABLE) ×1 IMPLANT
GOWN STRL REUS W/TWL LRG LVL3 (GOWN DISPOSABLE) ×1
GOWN STRL REUS W/TWL XL LVL3 (GOWN DISPOSABLE) ×1
IMMOBILIZER SHDR MD LX WHT (SOFTGOODS) IMPLANT
IMMOBILIZER SHDR XL LX WHT (SOFTGOODS) IMPLANT
INTRO PACEMAKR LEAD 9FR 13CM (INTRODUCER) ×2
INTRO PACEMKR SHEATH II 7FR (MISCELLANEOUS)
INTRODUCER PACEMKR LD 9FR 13CM (INTRODUCER) ×1 IMPLANT
INTRODUCER PACEMKR SHTH II 7FR (MISCELLANEOUS) IMPLANT
IV NS 500ML (IV SOLUTION) ×1
IV NS 500ML BAXH (IV SOLUTION) ×1 IMPLANT
KIT RM TURNOVER STRD PROC AR (KITS) ×2 IMPLANT
LABEL OR SOLS (LABEL) ×2 IMPLANT
LEAD CAPSURE NOVUS 5076-52CM (Lead) ×2 IMPLANT
MARKER SKIN W/RULER 31145785 (MISCELLANEOUS) ×2 IMPLANT
PACEMAKER LEAD ATRL (Lead) ×2 IMPLANT
PACK PACE INSERTION (MISCELLANEOUS) ×2 IMPLANT
PAD GROUND ADULT SPLIT (MISCELLANEOUS) ×2 IMPLANT
PAD STATPAD (MISCELLANEOUS) ×2 IMPLANT
PPM ADVISA MRI DR A2DR01 (Pacemaker) ×2 IMPLANT
SUT SILK 0 SH 30 (SUTURE) ×6 IMPLANT

## 2014-12-12 NOTE — Progress Notes (Addendum)
[facility] [place] NAME: Sarah Dougherty    MR#:  [id number]  DATE OF BIRTH:  04-Aug-1921  SUBJECTIVE:  CHIEF COMPLAINT:   Chief Complaint  Patient presents with  . Fall   Facial swelling improving. Continues to be bradycardic. Has agreed to pacemaker placement later today. No chest pain. No syncope.  REVIEW OF SYSTEMS:   Review of Systems  Constitutional: Negative for fever.  Respiratory: Negative for shortness of breath.   Cardiovascular: Negative for chest pain and palpitations.  Gastrointestinal: Negative for nausea, vomiting and abdominal pain.  Genitourinary: Negative for dysuria.    DRUG ALLERGIES:   Allergies  Allergen Reactions  . Nitrofurantoin     Other reaction(s): Unknown  . Penicillins     Other reaction(s): Unknown  . Sulfa Antibiotics     Other reaction(s): Unknown  . Azithromycin Rash    VITALS:  Blood pressure 174/76, pulse 61, temperature 98.1 F (36.7 C), temperature source Oral, resp. rate 20, height 5' (1.524 m), weight 56.972 kg (125 lb 9.6 oz), SpO2 94 %.  PHYSICAL EXAMINATION:  GENERAL:  81 y.o.-year-old patient lying in bed with no acute distress.  EYES: Right prosthetic eye, left pupil is reactive, extraocular motion intact HEENT: Significant facial trauma, nose is swollen and septum has swollen to the point of almost obstructing both nostrils, there is bruising over the forehead nose and both orbits NECK:  Supple, no jugular venous distention. No thyroid enlargement, no tenderness.  LUNGS: Normal breath sounds bilaterally, no wheezing, rales,rhonchi or crepitation. No use of accessory muscles of respiration.  CARDIOVASCULAR: S1, S2 normal. No murmurs, rubs, or gallops. Bradycardic ABDOMEN: Soft, nontender, nondistended. Bowel sounds present. No organomegaly or mass.  EXTREMITIES: No pedal edema, cyanosis, or clubbing. Left thumb is bandaged NEUROLOGIC: Cranial nerves II through XII are  intact. Muscle strength 5/5 in all extremities. Sensation intact. Gait not checked.  PSYCHIATRIC: The patient is alert and oriented x 3.  SKIN: No obvious rash, lesion, or ulcer. Bruising on the face as described above   LABORATORY PANEL:   CBC  Recent Labs Lab 12/12/14 0446  WBC 8.9  HGB 14.7  HCT 43.8  PLT 225   ------------------------------------------------------------------------------------------------------------------  Chemistries   Recent Labs Lab 12/11/14 1708 12/12/14 0446  NA 137 140  K 3.9 4.2  CL 106 106  CO2 22 27  GLUCOSE 147* 154*  BUN 29* 31*  CREATININE 1.24* 1.22*  CALCIUM 8.8* 9.0  AST 24  --   ALT 11*  --   ALKPHOS 73  --   BILITOT 1.7*  --    ------------------------------------------------------------------------------------------------------------------  Cardiac Enzymes No results for input(s): TROPONINI in the last 168 hours. ------------------------------------------------------------------------------------------------------------------  RADIOLOGY:  Dg Chest 2 View  12/11/2014   CLINICAL DATA:  Preop evaluation prior to pacemaker placement tomorrow for bradycardia. Current history of diabetes, hypertension and COPD.  EXAM: CHEST  2 VIEW  COMPARISON:  CT chest 02/17/2014. Chest x-rays 11/5 2015 and earlier.  FINDINGS: Cardiac silhouette mildly to moderately enlarged for the AP technique. Mildly prominent bronchovascular markings diffusely, unchanged. Lungs otherwise clear. No localized airspace consolidation. No pleural effusions. No pneumothorax. Normal pulmonary vascularity. Numerous healed left rib fractures again noted.  IMPRESSION: Stable cardiomegaly and COPD.  No acute cardiopulmonary disease.   Electronically Signed   By: Evangeline Dakin M.D.   On: 12/11/2014 18:39   Dg Chest Port 1 View  12/12/2014   CLINICAL DATA:  Post pacemaker insertion.  EXAM: PORTABLE CHEST - 1 VIEW  COMPARISON:  12/11/2014, 02/16/2014 and chest CT  02/17/2014  FINDINGS: Interval placement of left-sided pacemaker which appears intact. Lungs are hypoinflated without evidence of pneumothorax. Evidence of left basilar/ retrocardiac opacification slightly worse. Stable cardiomegaly. Old left rib fractures. Remainder of the exam is unchanged.  IMPRESSION: Hypoinflation with slight worsening left retrocardiac opacification which may be due to atelectasis/ effusion or infection. Consider PA and lateral chest x-ray for better evaluation of the left base  Left-sided pacemaker in adequate position.  No pneumothorax.  Stable cardiomegaly.   Electronically Signed   By: Marin Olp M.D.   On: 12/12/2014 15:00   Dg C-arm 1-60 Min-no Report  12/12/2014   CLINICAL DATA: pacemaker insertion   C-ARM 1-60 MINUTES  Fluoroscopy was utilized by the requesting physician.  No radiographic  interpretation.     EKG:   Orders placed or performed during the hospital encounter of 12/09/14  . EKG 12-Lead  . EKG 12-Lead  . EKG 12-Lead in am (before 8am)    ASSESSMENT AND PLAN:   #1 COPD, oxygen dependent, no exacerbation - Continue supplemental oxygenation, stable - Continue home medications  #2 bradycardia - Appreciate cardiology consultation and planned for pacemaker placement later today - On further reflection today she feels that she has had multiple falls over the past few months which could have been syncopal/cardiac events - Continue to monitor on telemetry, echo shows normal ejection fraction, grade 1 diastolic dysfunction  #3 hypertension - Continue Norvasc  #4 facial and nasal swelling: Improving. No fracture noted on CT, continue ice.   #5 diabetes - Continue home medications and sliding scale  #6 prophylaxis - Holding anticoagulation due to significant facial swelling and bruising, place SCDs. Will defer to cardiology regarding restarting anticoagulation upon discharge  #7 urinary tract infection: UA performed yesterday shows significant  number of white blood cells. Send urine for culture. Start Rocephin.  All the records are reviewed and case discussed with Care Management/Social Workerr. Management plans discussed with the patient, her son and daughter-in-law and they are in agreement. Case discussed with cardiology  CODE STATUS: Full  TOTAL TIME TAKING CARE OF THIS PATIENT: 32 minutes.  Greater than 50% of time spent in care coordination and counseling. POSSIBLE D/C IN 1-2 DAYS, DEPENDING ON CLINICAL CONDITION.   Myrtis Ser M.D on 12/12/2014 at 3:02 PM  Between 7am to 6pm - Pager - [phone]  After 6pm go to [email or web address] - password [email or web address] [facility]  Office  [phone]  CC: Primary care physician; Perrin Maltese, MD

## 2014-12-12 NOTE — Op Note (Signed)
[facility]   12/09/2014 - 12/12/2014                     1:52 PM  PATIENT:  Sarah Dougherty    PRE-OPERATIVE DIAGNOSIS:  sss,AFIB,Bradycardia  POST-OPERATIVE DIAGNOSIS:  Same  PROCEDURE:  INSERTION PACEMAKER  SURGEON:  Daymien Goth, MD    ANESTHESIA:     PREOPERATIVE INDICATIONS:  Sarah Dougherty is a  81 y.o. female with a diagnosis of sss,AFIB,Bradycardia who failed conservative measures and elected for surgical management.    The risks benefits and alternatives were discussed with the patient preoperatively including but not limited to the risks of infection, bleeding, cardiopulmonary complications, the need for revision surgery, among others, and the patient was willing to proceed.   OPERATIVE PROCEDURE: The patient was brought to the operating room the fasting state. The left pectoral region was prepped and draped in the usual sterile manner. Local anesthesia was obtained 1% Xylocaine. 6 cm incision was performed a left pectoral region. The pacemaker pocket was generated by electrocautery and blunt dissection. Access was obtained to the left subclavian vein by fine needle aspiration. MRI compatible leads were positioned into the right ventricular apical septum and right atrial appendage under fluoroscopic guidance. After proper thresholds were obtained the leads were sutured in place. The leads were connected to a MRI compatible dual-chamber rate responsive pacemaker generator (Medtronic Advisa DR MRI ) and positioned into the pocket the pocket was closed with 2-0 and 4-0 Vicryl, respectively. Steri-Strips and a pressure dressing were applied.

## 2014-12-12 NOTE — Transfer of Care (Signed)
Immediate Anesthesia Transfer of Care Note  Patient: Sarah Dougherty  Procedure(s) Performed: Procedure(s): INSERTION PACEMAKER (Left)  Patient Location: PACU  Anesthesia Type:General  Level of Consciousness: sedated  Airway & Oxygen Therapy: Patient Spontanous Breathing and Patient connected to face mask oxygen  Post-op Assessment: Report given to RN and Post -op Vital signs reviewed and stable  Post vital signs: Reviewed and stable  Last Vitals:  Filed Vitals:   12/12/14 1132  BP: 144/55  Pulse: 49  Temp: 36.4 C  Resp: 18    Complications: No apparent anesthesia complications

## 2014-12-12 NOTE — Care Management (Signed)
Patient's son states he at present time he does not wish to have home health services.

## 2014-12-12 NOTE — Progress Notes (Addendum)
   SUBJECTIVE: Pt resting comfortably, HR remains in 30s.   Filed Vitals:   12/11/14 1001 12/11/14 1116 12/11/14 1955 12/12/14 0451  BP: 131/56 139/48 167/58 131/43  Pulse: 44 40 46 49  Temp:  98.3 F (36.8 C) 98 F (36.7 C) 97.7 F (36.5 C)  TempSrc:  Oral Oral Oral  Resp:  16 20 18   Height:      Weight:      SpO2:  93% 95% 93%    Intake/Output Summary (Last 24 hours) at 12/12/14 0850 Last data filed at 12/12/14 0626  Gross per 24 hour  Intake    440 ml  Output    700 ml  Net   -260 ml    LABS: Basic Metabolic Panel:  Recent Labs  12/11/14 1708 12/12/14 0446  NA 137 140  K 3.9 4.2  CL 106 106  CO2 22 27  GLUCOSE 147* 154*  BUN 29* 31*  CREATININE 1.24* 1.22*  CALCIUM 8.8* 9.0   Liver Function Tests:  Recent Labs  12/11/14 1708  AST 24  ALT 11*  ALKPHOS 73  BILITOT 1.7*  PROT 6.7  ALBUMIN 3.8   No results for input(s): LIPASE, AMYLASE in the last 72 hours. CBC:  Recent Labs  12/09/14 1949  12/11/14 1708 12/12/14 0446  WBC 9.9  < > 9.7 8.9  NEUTROABS 7.0*  --   --   --   HGB 14.1  < > 14.4 14.7  HCT 42.0  < > 43.2 43.8  MCV 90.3  < > 89.9 90.7  PLT 204  < > 227 225  < > = values in this interval not displayed. Cardiac Enzymes: No results for input(s): CKTOTAL, CKMB, CKMBINDEX, TROPONINI in the last 72 hours. BNP: Invalid input(s): POCBNP D-Dimer: No results for input(s): DDIMER in the last 72 hours. Hemoglobin A1C: No results for input(s): HGBA1C in the last 72 hours. Fasting Lipid Panel: No results for input(s): CHOL, HDL, LDLCALC, TRIG, CHOLHDL, LDLDIRECT in the last 72 hours. Thyroid Function Tests: No results for input(s): TSH, T4TOTAL, T3FREE, THYROIDAB in the last 72 hours.  Invalid input(s): FREET3 Anemia Panel: No results for input(s): VITAMINB12, FOLATE, FERRITIN, TIBC, IRON, RETICCTPCT in the last 72 hours.   PHYSICAL EXAM General: Well developed, well nourished, in no acute distress HEENT: diffuse ecchymosis,  improving Neck:  No JVD.  Lungs: mild wheezes b/l  Heart: sinus bradycardia Abdomen: Bowel sounds are positive, abdomen soft and non-tender  Msk:  Back normal, normal gait. Normal strength and tone for age. Extremities: No clubbing, cyanosis or edema.   Neuro: Alert and oriented X 3. Psych:  Good affect, responds appropriately  TELEMETRY: Reviewed telemetry pt in sinus bradycardia  ASSESSMENT AND PLAN: HR remains in 30s, as low as 29 BPM yesterday. Await consult for PPM and decision by pt and pts family.    Patient and plan discussed with supervising provider, Dr. Neoma Laming, who agrees with above findings.   Kelby Fam Laqueena Hinchey, [place]  12/12/2014 8:50 AM     Addendum 12/12/14 9:00AM: Upon speaking with pts son, the decision has been made to proceed with PPM placement, scheduled for today at noon.

## 2014-12-12 NOTE — Care Management Important Message (Signed)
Important Message  Patient Details  Name: Sarah Dougherty MRN: [id number] Date of Birth: 05-Oct-1921   Medicare Important Message Given:  Yes-second notification given    Darius Bump [place] 12/12/2014, 9:37 AM

## 2014-12-12 NOTE — Anesthesia Preprocedure Evaluation (Signed)
Anesthesia Evaluation  Patient identified by MRN, date of birth, ID band Patient awake    Reviewed: Allergy & Precautions, NPO status , Patient's Chart, lab work & pertinent test results  History of Anesthesia Complications Negative for: history of anesthetic complications  Airway Mallampati: II  TM Distance: >3 FB Neck ROM: Full    Dental  (+) Edentulous Upper, Missing, Chipped   Pulmonary COPD (2 L o2 at night) COPD inhaler and oxygen dependent,          Cardiovascular hypertension, Pt. on medications + CAD + dysrhythmias (Bradycardia)     Neuro/Psych    GI/Hepatic GERD-  Medicated,  Endo/Other  diabetes, Type 2  Renal/GU Renal disease (UTIs)     Musculoskeletal   Abdominal   Peds  Hematology   Anesthesia Other Findings   Reproductive/Obstetrics                             Anesthesia Physical Anesthesia Plan  ASA: III  Anesthesia Plan: MAC   Post-op Pain Management:    Induction: Intravenous  Airway Management Planned: Nasal Cannula  Additional Equipment:   Intra-op Plan:   Post-operative Plan:   Informed Consent: I have reviewed the patients History and Physical, chart, labs and discussed the procedure including the risks, benefits and alternatives for the proposed anesthesia with the patient or authorized representative who has indicated his/her understanding and acceptance.     Plan Discussed with:   Anesthesia Plan Comments:         Anesthesia Quick Evaluation

## 2014-12-12 NOTE — Anesthesia Postprocedure Evaluation (Signed)
  Anesthesia Post-op Note  Patient: Sarah Dougherty  Procedure(s) Performed: Procedure(s): INSERTION PACEMAKER (Left)  Anesthesia type:General  Patient location: PACU  Post pain: Pain level controlled  Post assessment: Post-op Vital signs reviewed, Patient's Cardiovascular Status Stable, Respiratory Function Stable, Patent Airway and No signs of Nausea or vomiting  Post vital signs: Reviewed and stable  Last Vitals:  Filed Vitals:   12/12/14 1510  BP: 167/65  Pulse: 60  Temp: 36.6 C  Resp: 20    Level of consciousness: awake, alert  and patient cooperative  Complications: No apparent anesthesia complications

## 2014-12-12 NOTE — Progress Notes (Addendum)
PT Cancellation Note  Patient Details Name: Sarah Dougherty MRN: [id number] DOB: 1922/04/08   Cancelled Treatment:    Reason Eval/Treat Not Completed: Patient at procedure or test/unavailable (pt off floor for pacemaker implantation). Addendum:  Pt s/p pacemaker implantation 12/12/14 under general anesthesia.  Per protocol, will need new PT order post-op.  Will complete current PT order.  Please re-consult PT when pt is medically appropriate to participate in PT.  Raquel Sarna Dyshawn Cangelosi 12/12/2014, 12:57 PM Leitha Bleak, [place]

## 2014-12-12 NOTE — Anesthesia Procedure Notes (Signed)
Date/Time: 12/12/2014 1:19 PM Performed by: Nelda Marseille Pre-anesthesia Checklist: Patient identified, Emergency Drugs available, Suction available, Patient being monitored and Timeout performed Oxygen Delivery Method: Simple face mask

## 2014-12-13 ENCOUNTER — Encounter: Payer: Self-pay | Admitting: Cardiology

## 2014-12-13 LAB — CBC
HEMATOCRIT: 42.8 % (ref 35.0–47.0)
HEMOGLOBIN: 14.3 g/dL (ref 12.0–16.0)
MCH: 30.4 pg (ref 26.0–34.0)
MCHC: 33.3 g/dL (ref 32.0–36.0)
MCV: 91.4 fL (ref 80.0–100.0)
Platelets: 203 10*3/uL (ref 150–440)
RBC: 4.68 MIL/uL (ref 3.80–5.20)
RDW: 14.4 % (ref 11.5–14.5)
WBC: 8.3 10*3/uL (ref 3.6–11.0)

## 2014-12-13 LAB — BASIC METABOLIC PANEL
ANION GAP: 8 (ref 5–15)
BUN: 28 mg/dL — ABNORMAL HIGH (ref 6–20)
CALCIUM: 8.6 mg/dL — AB (ref 8.9–10.3)
CHLORIDE: 107 mmol/L (ref 101–111)
CO2: 25 mmol/L (ref 22–32)
Creatinine, Ser: 0.96 mg/dL (ref 0.44–1.00)
GFR calc non Af Amer: 50 mL/min — ABNORMAL LOW (ref >60)
GFR, EST AFRICAN AMERICAN: 58 mL/min — AB (ref >60)
GLUCOSE: 146 mg/dL — AB (ref 65–99)
POTASSIUM: 3.9 mmol/L (ref 3.5–5.1)
Sodium: 140 mmol/L (ref 135–145)

## 2014-12-13 LAB — GLUCOSE, CAPILLARY
GLUCOSE-CAPILLARY: 148 mg/dL — AB (ref 65–99)
GLUCOSE-CAPILLARY: 134 mg/dL — AB (ref 65–99)
Glucose-Capillary: 133 mg/dL — ABNORMAL HIGH (ref 65–99)

## 2014-12-13 MED ORDER — CIPROFLOXACIN HCL 250 MG PO TABS: 250.0000 mg | ORAL_TABLET | Freq: Two times a day (BID) | ORAL | Status: DC

## 2014-12-13 NOTE — Progress Notes (Signed)
   SUBJECTIVE: no change, ready to go home.    Filed Vitals:   12/12/14 1510 12/12/14 2010 12/13/14 0449 12/13/14 0912  BP: 167/65 129/54 151/67 125/61  Pulse: 60 59 59 59  Temp: 97.8 F (36.6 C) 98.1 F (36.7 C) 98.1 F (36.7 C) 98.2 F (36.8 C)  TempSrc:  Oral  Oral  Resp: 20 18 20 20   Height:      Weight:      SpO2: 91% 90% 99% 95%    Intake/Output Summary (Last 24 hours) at 12/13/14 1101 Last data filed at 12/13/14 0945  Gross per 24 hour  Intake   1680 ml  Output    850 ml  Net    830 ml    LABS: Basic Metabolic Panel:  Recent Labs  12/12/14 0446 12/13/14 0654  NA 140 140  K 4.2 3.9  CL 106 107  CO2 27 25  GLUCOSE 154* 146*  BUN 31* 28*  CREATININE 1.22* 0.96  CALCIUM 9.0 8.6*   Liver Function Tests:  Recent Labs  12/11/14 1708  AST 24  ALT 11*  ALKPHOS 73  BILITOT 1.7*  PROT 6.7  ALBUMIN 3.8   No results for input(s): LIPASE, AMYLASE in the last 72 hours. CBC:  Recent Labs  12/12/14 0446 12/13/14 0654  WBC 8.9 8.3  HGB 14.7 14.3  HCT 43.8 42.8  MCV 90.7 91.4  PLT 225 203   Cardiac Enzymes: No results for input(s): CKTOTAL, CKMB, CKMBINDEX, TROPONINI in the last 72 hours. BNP: Invalid input(s): POCBNP D-Dimer: No results for input(s): DDIMER in the last 72 hours. Hemoglobin A1C: No results for input(s): HGBA1C in the last 72 hours. Fasting Lipid Panel: No results for input(s): CHOL, HDL, LDLCALC, TRIG, CHOLHDL, LDLDIRECT in the last 72 hours. Thyroid Function Tests: No results for input(s): TSH, T4TOTAL, T3FREE, THYROIDAB in the last 72 hours.  Invalid input(s): FREET3 Anemia Panel: No results for input(s): VITAMINB12, FOLATE, FERRITIN, TIBC, IRON, RETICCTPCT in the last 72 hours.   PHYSICAL EXAM General: Well developed, well nourished, in no acute distress HEENT:  Normocephalic and atramatic Neck:  No JVD.  Lungs: Clear bilaterally to auscultation and percussion. Heart: HRRR . Normal S1 and S2 without gallops or  murmurs.  Abdomen: Bowel sounds are positive, abdomen soft and non-tender  Msk:  Back normal, normal gait. Normal strength and tone for age. Extremities: No clubbing, cyanosis or edema.   Neuro: Alert and oriented X 3. Psych:  Good affect, responds appropriately  TELEMETRY: Reviewed telemetry pt in paced rhythm 60 BPM  ASSESSMENT AND PLAN: pt s/p PPM, ok to d/c from cardiology stand point. Ok to hold amiodarone at d/c, but would like pt to restart xarelto at 15mg  daily. Pt given f/u in office 9/2 at 2:30pm.    Patient and plan discussed with supervising provider, Dr. Neoma Laming, who agrees with above findings.   Kelby Fam [place], [place]  12/13/2014 11:01 AM

## 2014-12-13 NOTE — Consult Note (Signed)
Reason for Consult:Bradycardia irregular heartbeat sick sinus syndrome Referring Physician:  Dr. Mariana Kaufman. Sarah Dougherty is an 81 y.o. female.  HPI:  81 year old white female presents with weakness recent fall with facial injury was found to be significantly bradycardic not patient is preop for evaluation of permanent pacemaker. Pacing complains of weakness ataxia multiple falls. Denies any chest pain.  The patient has had a history of atrial fibrillation and was on amiodarone now has significant bradycardia  Past Medical History  Diagnosis Date  . On home oxygen therapy     uses at night  . Diabetes mellitus without complication   . Hypertension   . Coronary artery disease     History reviewed. No pertinent past surgical history.  No family history on file.  Social History:  reports that she has never smoked. She does not have any smokeless tobacco history on file. She reports that she does not drink alcohol. Her drug history is not on file.  Allergies:  Allergies  Allergen Reactions  . Nitrofurantoin     Other reaction(s): Unknown  . Penicillins     Other reaction(s): Unknown  . Sulfa Antibiotics     Other reaction(s): Unknown  . Azithromycin Rash    Medications: I have reviewed the patient's current medications.  Results for orders placed or performed during the hospital encounter of 12/09/14 (from the past 48 hour(s))  Glucose, capillary     Status: Abnormal   Collection Time: 12/11/14  4:04 PM  Result Value Ref Range   Glucose-Capillary 115 (H) 65 - 99 mg/dL   Comment 1 Notify RN    Comment 2 Document in Chart   Blood gas, arterial on room air     Status: Abnormal   Collection Time: 12/11/14  5:00 PM  Result Value Ref Range   FIO2 0.21    pH, Arterial 7.45 7.350 - 7.450   pCO2 arterial 33 32.0 - 48.0 mmHg   pO2, Arterial 65 (L) 83.0 - 108.0 mmHg   Bicarbonate 22.9 21.0 - 28.0 mEq/L   Acid-base deficit 0.4 0.0 - 2.0 mmol/L   O2 Saturation 93.4 %   Patient  temperature 37.0    Collection site LEFT RADIAL    Sample type ARTERIAL DRAW    Allens test (pass/fail) POSITIVE (A) PASS  CBC     Status: Abnormal   Collection Time: 12/11/14  5:08 PM  Result Value Ref Range   WBC 9.7 3.6 - 11.0 K/uL   RBC 4.80 3.80 - 5.20 MIL/uL   Hemoglobin 14.4 12.0 - 16.0 g/dL   HCT 43.2 35.0 - 47.0 %   MCV 89.9 80.0 - 100.0 fL   MCH 30.0 26.0 - 34.0 pg   MCHC 33.4 32.0 - 36.0 g/dL   RDW 14.8 (H) 11.5 - 14.5 %   Platelets 227 150 - 440 K/uL  Comprehensive metabolic panel     Status: Abnormal   Collection Time: 12/11/14  5:08 PM  Result Value Ref Range   Sodium 137 135 - 145 mmol/L   Potassium 3.9 3.5 - 5.1 mmol/L    Comment: HEMOLYSIS AT THIS LEVEL MAY AFFECT RESULT   Chloride 106 101 - 111 mmol/L   CO2 22 22 - 32 mmol/L   Glucose, Bld 147 (H) 65 - 99 mg/dL   BUN 29 (H) 6 - 20 mg/dL   Creatinine, Ser 1.24 (H) 0.44 - 1.00 mg/dL   Calcium 8.8 (L) 8.9 - 10.3 mg/dL   Total Protein 6.7 6.5 -  8.1 g/dL   Albumin 3.8 3.5 - 5.0 g/dL   AST 24 15 - 41 U/L   ALT 11 (L) 14 - 54 U/L   Alkaline Phosphatase 73 38 - 126 U/L   Total Bilirubin 1.7 (H) 0.3 - 1.2 mg/dL   GFR calc non Af Amer 37 (L) >60 mL/min   GFR calc Af Amer 43 (L) >60 mL/min    Comment: (NOTE) The eGFR has been calculated using the CKD EPI equation. This calculation has not been validated in all clinical situations. eGFR's persistently <60 mL/min signify possible Chronic Kidney Disease.    Anion gap 9 5 - 15  Protime-INR     Status: None   Collection Time: 12/11/14  5:08 PM  Result Value Ref Range   Prothrombin Time 14.0 11.4 - 15.0 seconds   INR 1.06   APTT     Status: None   Collection Time: 12/11/14  5:08 PM  Result Value Ref Range   aPTT 29 24 - 36 seconds  Type and screen     Status: None   Collection Time: 12/11/14  5:08 PM  Result Value Ref Range   ABO/RH(D) O POS    Antibody Screen NEG    Sample Expiration 12/14/2014   ABO/Rh     Status: None   Collection Time: 12/11/14  5:09 PM   Result Value Ref Range   ABO/RH(D) O POS   Urinalysis complete, with microscopic ([facility] only)     Status: Abnormal   Collection Time: 12/11/14  6:20 PM  Result Value Ref Range   Color, Urine YELLOW (A) YELLOW   APPearance CLOUDY (A) CLEAR   Glucose, UA NEGATIVE NEGATIVE mg/dL   Bilirubin Urine NEGATIVE NEGATIVE   Ketones, ur NEGATIVE NEGATIVE mg/dL   Specific Gravity, Urine 1.016 1.005 - 1.030   Hgb urine dipstick 1+ (A) NEGATIVE   pH 5.0 5.0 - 8.0   Protein, ur 30 (A) NEGATIVE mg/dL   Nitrite NEGATIVE NEGATIVE   Leukocytes, UA 3+ (A) NEGATIVE   RBC / HPF 6-30 0 - 5 RBC/hpf   WBC, UA TOO NUMEROUS TO COUNT 0 - 5 WBC/hpf   Bacteria, UA MANY (A) NONE SEEN   Squamous Epithelial / LPF TOO NUMEROUS TO COUNT (A) NONE SEEN   Trans Epithel, UA 2    Mucous PRESENT    Hyaline Casts, UA PRESENT    Ca Oxalate Crys, UA PRESENT   Glucose, capillary     Status: Abnormal   Collection Time: 12/11/14  7:52 PM  Result Value Ref Range   Glucose-Capillary 156 (H) 65 - 99 mg/dL  CBC     Status: Abnormal   Collection Time: 12/12/14  4:46 AM  Result Value Ref Range   WBC 8.9 3.6 - 11.0 K/uL   RBC 4.83 3.80 - 5.20 MIL/uL   Hemoglobin 14.7 12.0 - 16.0 g/dL   HCT 43.8 35.0 - 47.0 %   MCV 90.7 80.0 - 100.0 fL   MCH 30.5 26.0 - 34.0 pg   MCHC 33.6 32.0 - 36.0 g/dL   RDW 14.7 (H) 11.5 - 14.5 %   Platelets 225 150 - 440 K/uL  Basic metabolic panel     Status: Abnormal   Collection Time: 12/12/14  4:46 AM  Result Value Ref Range   Sodium 140 135 - 145 mmol/L   Potassium 4.2 3.5 - 5.1 mmol/L   Chloride 106 101 - 111 mmol/L   CO2 27 22 - 32 mmol/L   Glucose, Bld  154 (H) 65 - 99 mg/dL   BUN 31 (H) 6 - 20 mg/dL   Creatinine, Ser 1.22 (H) 0.44 - 1.00 mg/dL   Calcium 9.0 8.9 - 10.3 mg/dL   GFR calc non Af Amer 38 (L) >60 mL/min   GFR calc Af Amer 44 (L) >60 mL/min    Comment: (NOTE) The eGFR has been calculated using the CKD EPI equation. This calculation has not been validated in all clinical  situations. eGFR's persistently <60 mL/min signify possible Chronic Kidney Disease.    Anion gap 7 5 - 15  Glucose, capillary     Status: Abnormal   Collection Time: 12/12/14  7:25 AM  Result Value Ref Range   Glucose-Capillary 156 (H) 65 - 99 mg/dL  Glucose, capillary     Status: Abnormal   Collection Time: 12/12/14 11:34 AM  Result Value Ref Range   Glucose-Capillary 135 (H) 65 - 99 mg/dL  Glucose, capillary     Status: Abnormal   Collection Time: 12/12/14  2:11 PM  Result Value Ref Range   Glucose-Capillary 133 (H) 65 - 99 mg/dL  Glucose, capillary     Status: Abnormal   Collection Time: 12/12/14  4:04 PM  Result Value Ref Range   Glucose-Capillary 180 (H) 65 - 99 mg/dL  Glucose, capillary     Status: Abnormal   Collection Time: 12/12/14  8:08 PM  Result Value Ref Range   Glucose-Capillary 179 (H) 65 - 99 mg/dL  Urine culture     Status: None (Preliminary result)   Collection Time: 12/12/14 11:01 PM  Result Value Ref Range   Specimen Description URINE, CLEAN CATCH    Special Requests Normal    Culture NO GROWTH < 12 HOURS    Report Status PENDING   CBC     Status: None   Collection Time: 12/13/14  6:54 AM  Result Value Ref Range   WBC 8.3 3.6 - 11.0 K/uL   RBC 4.68 3.80 - 5.20 MIL/uL   Hemoglobin 14.3 12.0 - 16.0 g/dL   HCT 42.8 35.0 - 47.0 %   MCV 91.4 80.0 - 100.0 fL   MCH 30.4 26.0 - 34.0 pg   MCHC 33.3 32.0 - 36.0 g/dL   RDW 14.4 11.5 - 14.5 %   Platelets 203 150 - 440 K/uL  Basic metabolic panel     Status: Abnormal   Collection Time: 12/13/14  6:54 AM  Result Value Ref Range   Sodium 140 135 - 145 mmol/L   Potassium 3.9 3.5 - 5.1 mmol/L   Chloride 107 101 - 111 mmol/L   CO2 25 22 - 32 mmol/L   Glucose, Bld 146 (H) 65 - 99 mg/dL   BUN 28 (H) 6 - 20 mg/dL   Creatinine, Ser 0.96 0.44 - 1.00 mg/dL   Calcium 8.6 (L) 8.9 - 10.3 mg/dL   GFR calc non Af Amer 50 (L) >60 mL/min   GFR calc Af Amer 58 (L) >60 mL/min    Comment: (NOTE) The eGFR has been  calculated using the CKD EPI equation. This calculation has not been validated in all clinical situations. eGFR's persistently <60 mL/min signify possible Chronic Kidney Disease.    Anion gap 8 5 - 15  Glucose, capillary     Status: Abnormal   Collection Time: 12/13/14  7:26 AM  Result Value Ref Range   Glucose-Capillary 148 (H) 65 - 99 mg/dL   Comment 1 Notify RN     Dg Chest 2 View  12/11/2014   CLINICAL DATA:  Preop evaluation prior to pacemaker placement tomorrow for bradycardia. Current history of diabetes, hypertension and COPD.  EXAM: CHEST  2 VIEW  COMPARISON:  CT chest 02/17/2014. Chest x-rays 11/5 2015 and earlier.  FINDINGS: Cardiac silhouette mildly to moderately enlarged for the AP technique. Mildly prominent bronchovascular markings diffusely, unchanged. Lungs otherwise clear. No localized airspace consolidation. No pleural effusions. No pneumothorax. Normal pulmonary vascularity. Numerous healed left rib fractures again noted.  IMPRESSION: Stable cardiomegaly and COPD.  No acute cardiopulmonary disease.   Electronically Signed   By: Evangeline Dakin M.D.   On: 12/11/2014 18:39   Dg Chest Port 1 View  12/12/2014   CLINICAL DATA:  Post pacemaker insertion.  EXAM: PORTABLE CHEST - 1 VIEW  COMPARISON:  12/11/2014, 02/16/2014 and chest CT 02/17/2014  FINDINGS: Interval placement of left-sided pacemaker which appears intact. Lungs are hypoinflated without evidence of pneumothorax. Evidence of left basilar/ retrocardiac opacification slightly worse. Stable cardiomegaly. Old left rib fractures. Remainder of the exam is unchanged.  IMPRESSION: Hypoinflation with slight worsening left retrocardiac opacification which may be due to atelectasis/ effusion or infection. Consider PA and lateral chest x-ray for better evaluation of the left base  Left-sided pacemaker in adequate position.  No pneumothorax.  Stable cardiomegaly.   Electronically Signed   By: Marin Olp M.D.   On: 12/12/2014 15:00    Dg C-arm 1-60 Min-no Report  12/12/2014   CLINICAL DATA: pacemaker insertion   C-ARM 1-60 MINUTES  Fluoroscopy was utilized by the requesting physician.  No radiographic  interpretation.     Review of Systems  Constitutional: Positive for weight loss and malaise/fatigue.  HENT: Negative.   Eyes: Negative.   Respiratory: Positive for shortness of breath.   Cardiovascular: Positive for palpitations and orthopnea.  Gastrointestinal: Negative.   Genitourinary: Negative.   Musculoskeletal: Positive for joint pain.  Skin: Negative.   Neurological: Positive for dizziness.  Psychiatric/Behavioral: Negative.    Blood pressure 139/65, pulse 60, temperature 97.7 F (36.5 C), temperature source Oral, resp. rate 18, height 5' (1.524 m), weight 56.972 kg (125 lb 9.6 oz), SpO2 94 %. Physical Exam  Constitutional: She appears well-developed and well-nourished.  HENT:  Head: Normocephalic and atraumatic.  Eyes: EOM are normal. Pupils are equal, round, and reactive to light.  Neck: Normal range of motion. Neck supple.  Cardiovascular: S1 normal and S2 normal.   Extrasystoles are present. Bradycardia present.  PMI is displaced.   Murmur heard.  Systolic murmur is present with a grade of 2/6    Assessment/Plan:  bradycardia  atrial fibrillation  fall  diabetes  hypertension  coronary artery disease  history of a subdural hematoma . PLAN  agree with telemetry  continue to hold amiodarone  recommend permanent pacemaker placement within 24-48 hours  physical therapy to help with gait training and reduce fall risk  continue home O2 for hypoxemia  continue local therapy for facial trauma  do not recommend anticoagulation for AFib because of fall risk  will proceed with permanent pacemaker placement with Dr. Madelyn Brunner D. 12/13/2014, 11:27 AM

## 2014-12-13 NOTE — Care Management (Signed)
Attending to discharge home with home health nursing and physical therapy.  States that son is agreeable.  CM spoke with son and he is again declining home health.  Declined multiple times even when CM discussed the benefits. Informed attending.  Discussed the need to obtain exertional room air sats with primary nurse

## 2014-12-13 NOTE — Care Management (Signed)
After speaking with physical therapy, son now says that patient's  Gilford Rile in the home may be too big for her.  Obtained prescription for walker and it will be delivered to patient's room.  Son continues to decline home health

## 2014-12-13 NOTE — Evaluation (Signed)
Physical Therapy Evaluation Patient Details Name: Sarah Dougherty MRN: [id number] DOB: 1921-10-02 Today's Date: 12/13/2014   History of Present Illness  Pt is a 81 y.o. female s/p L pacemaker 12/12/14 secondary to a-fib and bradycardia.  Clinical Impression  Currently pt demonstrates impairments with balance, strength, and limitations with functional mobility.  Prior to admission, pt was supervision with functional mobility using quad cane.  Pt lives with her son who provides 24/7 assist.  Currently pt is CGA to min assist with functional mobility and demonstrates improved balance using a RW.  Pt would benefit from skilled PT to address above noted impairments and functional limitations.  Recommend pt discharge to home with HHPT and 24/7 assist for functional mobility (with use of RW) when medically appropriate; pt's son refusing HHPT at this time (CM notified).  No POC initiated d/t pt discharged home already this afternoon.     Follow Up Recommendations Home health PT;Supervision/Assistance - 24 hour    Equipment Recommendations  Rolling walker with 5" wheels    Recommendations for Other Services       Precautions / Restrictions Precautions Precautions: Fall;ICD/Pacemaker Restrictions Weight Bearing Restrictions: No      Mobility  Bed Mobility Overal bed mobility: Needs Assistance Bed Mobility: Supine to Sit;Sit to Supine     Supine to sit: Supervision Sit to supine: Supervision      Transfers Overall transfer level: Needs assistance Equipment used: Rolling walker (2 wheeled) Transfers: Sit to/from Stand Sit to Stand: Min guard;Min assist            Ambulation/Gait Ambulation/Gait assistance: Min guard Ambulation Distance (Feet): 150 Feet Assistive device: Rolling walker (2 wheeled) Gait Pattern/deviations: WFL(Within Functional Limits) Gait velocity: decreased   General Gait Details: steady without loss of balance with RW; trialed quad cane with pt and pt CGA  to min assist to steady with increased lateral sway and unsteadiness (x60 feet with quad cane).  Stairs            Wheelchair Mobility    Modified Rankin (Stroke Patients Only)       Balance Overall balance assessment: Needs assistance Sitting-balance support: Bilateral upper extremity supported;Feet supported Sitting balance-Leahy Scale: Good     Standing balance support: Bilateral upper extremity supported;During functional activity Standing balance-Leahy Scale: Good                               Pertinent Vitals/Pain Pain Assessment: No/denies pain  Vitals stable and WFL throughout treatment session.    Home Living Family/patient expects to be discharged to:: Private residence Living Arrangements: Children (Pt's son) Available Help at Discharge: Family Type of Home: House Home Access: Stairs to enter   [employer] of Steps: 2 with rail in middle of steps Home Layout: One level Home Equipment: Cane - quad;Bedside commode;Shower seat      Prior Function Level of Independence: Needs assistance   Gait / Transfers Assistance Needed: supervision with mobility with quad cane  ADL's / Homemaking Assistance Needed: pt showers herself using shower seat  Comments: Pt has 24/7 assist from her son who reports he is her "caregiver"; otherwise has other family who assist (pt is never left alone)     Hand Dominance        Extremity/Trunk Assessment   Upper Extremity Assessment: RUE deficits/detail;LUE deficits/detail RUE Deficits / Details: WFL     LUE Deficits / Details: deferred MMT post pacemaker implantation  Lower Extremity Assessment: Generalized weakness         Communication   Communication: HOH  Cognition Arousal/Alertness: Awake/alert Behavior During Therapy: WFL for tasks assessed/performed Overall Cognitive Status: Within Functional Limits for tasks assessed       Memory: Decreased recall of precautions               General Comments   Nursing cleared pt for participation in physical therapy.  Pt agreeable to PT session. Pt's son present throughout treatment session.    Exercises  Educated pt and pt's son on post-op pacemaker implantation precautions.      Assessment/Plan    PT Assessment Patient needs continued PT services  PT Diagnosis Difficulty walking;Generalized weakness   PT Problem List Decreased strength;Decreased balance;Decreased mobility;Decreased knowledge of use of DME;Decreased knowledge of precautions  PT Treatment Interventions DME instruction;Gait training;Stair training;Functional mobility training;Therapeutic activities;Therapeutic exercise;Balance training;Patient/family education   PT Goals (Current goals can be found in the Care Plan section) Acute Rehab PT Goals Patient Stated Goal: To go home PT Goal Formulation: With patient/family Time For Goal Achievement: 12/27/14 Potential to Achieve Goals: Good    Frequency Min 2X/week   Barriers to discharge        Co-evaluation               End of Session Equipment Utilized During Treatment: Gait belt Activity Tolerance: Patient tolerated treatment well Patient left: in bed;with call bell/phone within reach;with bed alarm set;with family/visitor present Nurse Communication: Mobility status         Time: 1325-1400 PT Time Calculation (min) (ACUTE ONLY): 35 min   Charges:   PT Evaluation $Initial PT Evaluation Tier I: 1 Procedure PT Treatments $Gait Training: 8-22 mins   PT G CodesLeitha Bleak 12/18/14, 5:18 PM Leitha Bleak, [place]

## 2014-12-13 NOTE — Progress Notes (Signed)
Patient is being discharge home in a stable condition , steri strip to incision site dry and intact with old dry blood noted, education given on site care and not to lift arm above to head, summary and f/u care given to pt and son , verbalized understanding , awaiting for pt's walker before leaving .

## 2014-12-14 LAB — URINE CULTURE
Culture: 6000
Special Requests: NORMAL

## 2014-12-14 NOTE — Discharge Summary (Signed)
[place] [facility] [place]   PATIENT NAME: Sarah Dougherty    MR#:  [id number]  DATE OF BIRTH:  03/15/1922  DATE OF ADMISSION:  12/09/2014 ADMITTING PHYSICIAN: Baxter Hire, MD  DATE OF DISCHARGE: 12/13/2014  PRIMARY CARE PHYSICIAN: Perrin Maltese, MD    ADMISSION DIAGNOSIS:  Facial swelling [R22.0] Laceration of thumb, left, initial encounter [S61.002A] Nasal contusion, initial encounter [S00.33XA] Nasal fracture, closed, initial encounter [S02.2XXA] Skin tear of forearm without complication, right, initial encounter [S51.811A]  DISCHARGE DIAGNOSIS:  Active Problems:   COPD (chronic obstructive pulmonary disease)   Bradycardia   SECONDARY DIAGNOSIS:   Past Medical History  Diagnosis Date  . On home oxygen therapy     uses at night  . Diabetes mellitus without complication   . Hypertension   . Coronary artery disease     HOSPITAL COURSE:   #1 COPD, oxygen dependent, no exacerbation. Stable. She remains on 2 L via nasal cannula upon discharge. This is unchanged. She will continue her former regimen of inhalers.  #2 bradycardia: During the hospitalization and was noted that she was persistently bradycardic with lows while she was sleeping into the 30s. She was seen by her primary cardiologist Dr. Chancy Milroy and after much discussion agreed to have a pacemaker placed. This was performed on 8/30 by Dr. Tomasita Crumble shows. Procedure was without complication and her heart rate is in the 80s. It is highly likely that her recent history of falls are due to symptomatic bradycardia. 2-D echocardiogram shows normal ejection fraction with grade 1 diastolic dysfunction. She will follow-up with Dr. Chancy Milroy in the outpatient setting. Amiodarone has been discontinued  #3 hypertension: Controlled with Norvasc.  #4 facial and nasal swelling: Improving. No fracture noted on CT.  #5 diabetes: Continue home medications and sliding scale.   #6  atrial fibrillation: She had been on Eliquis prior to admission for prophylaxis. Due to the significant amount of bruising and bleeding from her facial wounds this was held during the admission. Cardiology recommends restarting Eliquis upon discharge. I think that her fall risk is much reduced now that she has a pacemaker in place. She was offered home health physical therapy but declined.  #7 urinary tract infection: UA with a significant number of white blood cells. She was treated with 2 days of IV Rocephin as an inpatient. She was started on ciprofloxacin on discharge. Culture has not grown a significant amount of bacteria but this was collected after antibiotics were initiated.   DISCHARGE CONDITIONS:   Stable  CONSULTS OBTAINED:  Treatment Team:  Dionisio David, MD Barnabas Harries, PA-C  DRUG ALLERGIES:   Allergies  Allergen Reactions  . Nitrofurantoin     Other reaction(s): Unknown  . Penicillins     Other reaction(s): Unknown  . Sulfa Antibiotics     Other reaction(s): Unknown  . Azithromycin Rash    DISCHARGE MEDICATIONS:   Discharge Medication List as of 12/13/2014  2:48 PM    START taking these medications   Details  ciprofloxacin (CIPRO) 250 MG tablet Take 1 tablet (250 mg total) by mouth 2 (two) times daily., Starting 12/13/2014, Until Discontinued, Print      CONTINUE these medications which have NOT CHANGED   Details  acetaminophen (TYLENOL 8 HOUR) 650 MG CR tablet Take 650 mg by mouth every 8 (eight) hours as needed for pain., Until Discontinued, Historical Med    amLODipine (NORVASC) 5 MG tablet Take 5 mg by mouth  daily., Starting 11/13/2014, Until Discontinued, Historical Med    clotrimazole (GYNE-LOTRIMIN) 1 % vaginal cream Place 1 application vaginally daily as needed. For yeast infection., Starting 11/15/2014, Until Discontinued, Historical Med    glimepiride (AMARYL) 2 MG tablet Take 2 mg by mouth daily., Starting 10/18/2014, Until Discontinued, Historical Med     hydrocortisone cream (NEOSPORIN ECZEMA ESSENT MAX ST) 1 % Apply 1 application topically 2 (two) times daily as needed for itching (for sores.)., Until Discontinued, Historical Med    Rivaroxaban (XARELTO PO) Take 1 tablet by mouth daily., Until Discontinued, Historical Med    sodium chloride (OCEAN) 0.65 % nasal spray Place 2 sprays into the nose daily as needed., Starting 09/14/2014, Until Discontinued, Historical Med      STOP taking these medications     amiodarone (PACERONE) 200 MG tablet          DISCHARGE INSTRUCTIONS:   See AVS  Today   CHIEF COMPLAINT:   Chief Complaint  Patient presents with  . Fall    HISTORY OF PRESENT ILLNESS:  HPI: [place] after missing step today and hit her face. C/O pain on her lower lip and nose. Reports no other injuries. Patient wears oxygen at home and with significant nasal swelling it was felt that overnight observation was warrented. Also, patient has history of subdural hematomia that was not detected until 3 weeks after a previous fall.  VITAL SIGNS:  Blood pressure 139/65, pulse 60, temperature 97.7 F (36.5 C), temperature source Oral, resp. rate 18, height 5' (1.524 m), weight 56.972 kg (125 lb 9.6 oz), SpO2 99 %.  I/O:  No intake or output data in the 24 hours ending 12/14/14 1414  PHYSICAL EXAMINATION:  GENERAL: 81 y.o.-year-old patient lying in bed with no acute distress.  EYES: Right prosthetic eye, left pupil is reactive, extraocular motion intact HEENT: Significant facial trauma, nose is swollen and septum has swollen to the point of almost obstructing both nostrils, there is bruising over the forehead nose and both orbits NECK: Supple, no jugular venous distention. No thyroid enlargement, no tenderness.  LUNGS: Normal breath sounds bilaterally, no wheezing, rales,rhonchi or crepitation. No use of accessory muscles of respiration.  CARDIOVASCULAR: S1, S2 normal. No murmurs, rubs, or gallops. Bradycardic ABDOMEN:  Soft, nontender, nondistended. Bowel sounds present. No organomegaly or mass.  EXTREMITIES: No pedal edema, cyanosis, or clubbing. Left thumb is bandaged NEUROLOGIC: Cranial nerves II through XII are intact. Muscle strength 5/5 in all extremities. Sensation intact. Gait not checked.  PSYCHIATRIC: The patient is alert and oriented x 3.  SKIN: Bruising on the face as described above  DATA REVIEW:   CBC  Recent Labs Lab 12/13/14 0654  WBC 8.3  HGB 14.3  HCT 42.8  PLT 203    Chemistries   Recent Labs Lab 12/11/14 1708  12/13/14 0654  NA 137  < > 140  K 3.9  < > 3.9  CL 106  < > 107  CO2 22  < > 25  GLUCOSE 147*  < > 146*  BUN 29*  < > 28*  CREATININE 1.24*  < > 0.96  CALCIUM 8.8*  < > 8.6*  AST 24  --   --   ALT 11*  --   --   ALKPHOS 73  --   --   BILITOT 1.7*  --   --   < > = values in this interval not displayed.  Cardiac Enzymes No results for input(s): TROPONINI in the last 168 hours.  Microbiology Results  Results for orders placed or performed during the hospital encounter of 12/09/14  Urine culture     Status: None   Collection Time: 12/12/14 11:01 PM  Result Value Ref Range Status   Specimen Description URINE, CLEAN CATCH  Final   Special Requests Normal  Final   Culture 6,000 COLONIES/mL INSIGNIFICANT GROWTH  Final   Report Status 12/14/2014 FINAL  Final    RADIOLOGY:  Dg Chest Port 1 View  12/12/2014   CLINICAL DATA:  Post pacemaker insertion.  EXAM: PORTABLE CHEST - 1 VIEW  COMPARISON:  12/11/2014, 02/16/2014 and chest CT 02/17/2014  FINDINGS: Interval placement of left-sided pacemaker which appears intact. Lungs are hypoinflated without evidence of pneumothorax. Evidence of left basilar/ retrocardiac opacification slightly worse. Stable cardiomegaly. Old left rib fractures. Remainder of the exam is unchanged.  IMPRESSION: Hypoinflation with slight worsening left retrocardiac opacification which may be due to atelectasis/ effusion or infection.  Consider PA and lateral chest x-ray for better evaluation of the left base  Left-sided pacemaker in adequate position.  No pneumothorax.  Stable cardiomegaly.   Electronically Signed   By: Marin Olp M.D.   On: 12/12/2014 15:00    EKG:   Orders placed or performed during the hospital encounter of 12/09/14  . EKG 12-Lead  . EKG 12-Lead  . EKG 12-Lead in am (before 8am)  . EKG 12-Lead in am (before 8am)      Management plans discussed with the patient, family and they are in agreement.  CODE STATUS:  Advance Directive Documentation        Most Recent Value   Type of Advance Directive  Healthcare Power of Attorney   Pre-existing out of facility DNR order (yellow form or pink MOST form)     "MOST" Form in Place?        TOTAL TIME TAKING CARE OF THIS PATIENT: 35 minutes.  Greater than 50% of time spent in care coordination and counseling.  Myrtis Ser M.D on 12/14/2014 at 2:14 PM  Between 7am to 6pm - Pager - [phone]  After 6pm go to [email or web address] - password [email or web address] [facility]  Office  [phone]  CC: Primary care physician; Perrin Maltese, MD

## 2014-12-27 ENCOUNTER — Encounter: Payer: Self-pay | Admitting: Cardiology

## 2015-04-17 DIAGNOSIS — I4891 Unspecified atrial fibrillation: Secondary | ICD-10-CM | POA: Diagnosis not present

## 2015-04-17 DIAGNOSIS — E782 Mixed hyperlipidemia: Secondary | ICD-10-CM | POA: Diagnosis not present

## 2015-04-17 DIAGNOSIS — N390 Urinary tract infection, site not specified: Secondary | ICD-10-CM | POA: Diagnosis not present

## 2015-04-17 DIAGNOSIS — I10 Essential (primary) hypertension: Secondary | ICD-10-CM | POA: Diagnosis not present

## 2015-04-17 DIAGNOSIS — E1122 Type 2 diabetes mellitus with diabetic chronic kidney disease: Secondary | ICD-10-CM | POA: Diagnosis not present

## 2015-04-27 DIAGNOSIS — I10 Essential (primary) hypertension: Secondary | ICD-10-CM | POA: Diagnosis not present

## 2015-04-27 DIAGNOSIS — E782 Mixed hyperlipidemia: Secondary | ICD-10-CM | POA: Diagnosis not present

## 2015-04-27 DIAGNOSIS — N390 Urinary tract infection, site not specified: Secondary | ICD-10-CM | POA: Diagnosis not present

## 2015-04-27 DIAGNOSIS — E1122 Type 2 diabetes mellitus with diabetic chronic kidney disease: Secondary | ICD-10-CM | POA: Diagnosis not present

## 2015-05-11 DIAGNOSIS — E782 Mixed hyperlipidemia: Secondary | ICD-10-CM | POA: Diagnosis not present

## 2015-05-11 DIAGNOSIS — I351 Nonrheumatic aortic (valve) insufficiency: Secondary | ICD-10-CM | POA: Diagnosis not present

## 2015-05-11 DIAGNOSIS — I10 Essential (primary) hypertension: Secondary | ICD-10-CM | POA: Diagnosis not present

## 2015-05-11 DIAGNOSIS — I480 Paroxysmal atrial fibrillation: Secondary | ICD-10-CM | POA: Diagnosis not present

## 2015-05-11 DIAGNOSIS — Z950 Presence of cardiac pacemaker: Secondary | ICD-10-CM | POA: Diagnosis not present

## 2015-05-12 DIAGNOSIS — G4733 Obstructive sleep apnea (adult) (pediatric): Secondary | ICD-10-CM | POA: Diagnosis not present

## 2015-06-06 DIAGNOSIS — R0602 Shortness of breath: Secondary | ICD-10-CM | POA: Diagnosis not present

## 2015-06-06 DIAGNOSIS — R0609 Other forms of dyspnea: Secondary | ICD-10-CM | POA: Diagnosis not present

## 2015-06-06 DIAGNOSIS — G4734 Idiopathic sleep related nonobstructive alveolar hypoventilation: Secondary | ICD-10-CM | POA: Diagnosis not present

## 2015-06-06 DIAGNOSIS — J449 Chronic obstructive pulmonary disease, unspecified: Secondary | ICD-10-CM | POA: Diagnosis not present

## 2015-06-12 DIAGNOSIS — G4733 Obstructive sleep apnea (adult) (pediatric): Secondary | ICD-10-CM | POA: Diagnosis not present

## 2015-07-05 DIAGNOSIS — M25512 Pain in left shoulder: Secondary | ICD-10-CM | POA: Diagnosis not present

## 2015-07-05 DIAGNOSIS — N390 Urinary tract infection, site not specified: Secondary | ICD-10-CM | POA: Diagnosis not present

## 2015-07-07 DIAGNOSIS — Z01 Encounter for other special examination without complaint, suspected or reported diagnosis: Secondary | ICD-10-CM | POA: Diagnosis not present

## 2015-07-09 DIAGNOSIS — M25512 Pain in left shoulder: Secondary | ICD-10-CM | POA: Diagnosis not present

## 2015-07-09 DIAGNOSIS — E782 Mixed hyperlipidemia: Secondary | ICD-10-CM | POA: Diagnosis not present

## 2015-07-09 DIAGNOSIS — I4891 Unspecified atrial fibrillation: Secondary | ICD-10-CM | POA: Diagnosis not present

## 2015-07-09 DIAGNOSIS — M12812 Other specific arthropathies, not elsewhere classified, left shoulder: Secondary | ICD-10-CM | POA: Diagnosis not present

## 2015-07-10 DIAGNOSIS — G4733 Obstructive sleep apnea (adult) (pediatric): Secondary | ICD-10-CM | POA: Diagnosis not present

## 2015-07-18 DIAGNOSIS — M9901 Segmental and somatic dysfunction of cervical region: Secondary | ICD-10-CM | POA: Diagnosis not present

## 2015-07-18 DIAGNOSIS — M531 Cervicobrachial syndrome: Secondary | ICD-10-CM | POA: Diagnosis not present

## 2015-07-20 DIAGNOSIS — M170 Bilateral primary osteoarthritis of knee: Secondary | ICD-10-CM | POA: Diagnosis not present

## 2015-07-26 DIAGNOSIS — E782 Mixed hyperlipidemia: Secondary | ICD-10-CM | POA: Diagnosis not present

## 2015-07-26 DIAGNOSIS — I10 Essential (primary) hypertension: Secondary | ICD-10-CM | POA: Diagnosis not present

## 2015-07-26 DIAGNOSIS — E119 Type 2 diabetes mellitus without complications: Secondary | ICD-10-CM | POA: Diagnosis not present

## 2015-08-10 DIAGNOSIS — G4733 Obstructive sleep apnea (adult) (pediatric): Secondary | ICD-10-CM | POA: Diagnosis not present

## 2015-09-09 DIAGNOSIS — G4733 Obstructive sleep apnea (adult) (pediatric): Secondary | ICD-10-CM | POA: Diagnosis not present

## 2015-09-24 DIAGNOSIS — I10 Essential (primary) hypertension: Secondary | ICD-10-CM | POA: Diagnosis not present

## 2015-09-24 DIAGNOSIS — F419 Anxiety disorder, unspecified: Secondary | ICD-10-CM | POA: Diagnosis not present

## 2015-09-24 DIAGNOSIS — I4891 Unspecified atrial fibrillation: Secondary | ICD-10-CM | POA: Diagnosis not present

## 2015-09-24 DIAGNOSIS — N390 Urinary tract infection, site not specified: Secondary | ICD-10-CM | POA: Diagnosis not present

## 2015-10-01 DIAGNOSIS — H401121 Primary open-angle glaucoma, left eye, mild stage: Secondary | ICD-10-CM | POA: Diagnosis not present

## 2015-10-09 DIAGNOSIS — I4891 Unspecified atrial fibrillation: Secondary | ICD-10-CM | POA: Diagnosis not present

## 2015-10-09 DIAGNOSIS — Z950 Presence of cardiac pacemaker: Secondary | ICD-10-CM | POA: Diagnosis not present

## 2015-10-09 DIAGNOSIS — E782 Mixed hyperlipidemia: Secondary | ICD-10-CM | POA: Diagnosis not present

## 2015-10-10 DIAGNOSIS — G4733 Obstructive sleep apnea (adult) (pediatric): Secondary | ICD-10-CM | POA: Diagnosis not present

## 2015-11-06 DIAGNOSIS — E1165 Type 2 diabetes mellitus with hyperglycemia: Secondary | ICD-10-CM | POA: Diagnosis not present

## 2015-11-06 DIAGNOSIS — R269 Unspecified abnormalities of gait and mobility: Secondary | ICD-10-CM | POA: Diagnosis not present

## 2015-11-06 DIAGNOSIS — I10 Essential (primary) hypertension: Secondary | ICD-10-CM | POA: Diagnosis not present

## 2015-11-09 DIAGNOSIS — G4733 Obstructive sleep apnea (adult) (pediatric): Secondary | ICD-10-CM | POA: Diagnosis not present

## 2015-11-27 DIAGNOSIS — N390 Urinary tract infection, site not specified: Secondary | ICD-10-CM | POA: Diagnosis not present

## 2015-11-27 DIAGNOSIS — E1122 Type 2 diabetes mellitus with diabetic chronic kidney disease: Secondary | ICD-10-CM | POA: Diagnosis not present

## 2015-11-27 DIAGNOSIS — M12812 Other specific arthropathies, not elsewhere classified, left shoulder: Secondary | ICD-10-CM | POA: Diagnosis not present

## 2015-11-27 DIAGNOSIS — E782 Mixed hyperlipidemia: Secondary | ICD-10-CM | POA: Diagnosis not present

## 2015-11-27 DIAGNOSIS — I10 Essential (primary) hypertension: Secondary | ICD-10-CM | POA: Diagnosis not present

## 2015-12-04 DIAGNOSIS — N390 Urinary tract infection, site not specified: Secondary | ICD-10-CM | POA: Diagnosis not present

## 2015-12-04 DIAGNOSIS — E876 Hypokalemia: Secondary | ICD-10-CM | POA: Diagnosis not present

## 2015-12-04 DIAGNOSIS — I10 Essential (primary) hypertension: Secondary | ICD-10-CM | POA: Diagnosis not present

## 2015-12-04 DIAGNOSIS — E782 Mixed hyperlipidemia: Secondary | ICD-10-CM | POA: Diagnosis not present

## 2015-12-04 DIAGNOSIS — E1122 Type 2 diabetes mellitus with diabetic chronic kidney disease: Secondary | ICD-10-CM | POA: Diagnosis not present

## 2015-12-10 DIAGNOSIS — G4733 Obstructive sleep apnea (adult) (pediatric): Secondary | ICD-10-CM | POA: Diagnosis not present

## 2016-01-08 DIAGNOSIS — L910 Hypertrophic scar: Secondary | ICD-10-CM | POA: Diagnosis not present

## 2016-01-08 DIAGNOSIS — L853 Xerosis cutis: Secondary | ICD-10-CM | POA: Diagnosis not present

## 2016-01-08 DIAGNOSIS — Z85828 Personal history of other malignant neoplasm of skin: Secondary | ICD-10-CM | POA: Diagnosis not present

## 2016-01-08 DIAGNOSIS — L821 Other seborrheic keratosis: Secondary | ICD-10-CM | POA: Diagnosis not present

## 2016-01-10 DIAGNOSIS — G4733 Obstructive sleep apnea (adult) (pediatric): Secondary | ICD-10-CM | POA: Diagnosis not present

## 2016-01-15 DIAGNOSIS — I10 Essential (primary) hypertension: Secondary | ICD-10-CM | POA: Diagnosis not present

## 2016-01-15 DIAGNOSIS — E119 Type 2 diabetes mellitus without complications: Secondary | ICD-10-CM | POA: Diagnosis not present

## 2016-01-15 DIAGNOSIS — N390 Urinary tract infection, site not specified: Secondary | ICD-10-CM | POA: Diagnosis not present

## 2016-01-15 DIAGNOSIS — E782 Mixed hyperlipidemia: Secondary | ICD-10-CM | POA: Diagnosis not present

## 2016-01-15 DIAGNOSIS — Z23 Encounter for immunization: Secondary | ICD-10-CM | POA: Diagnosis not present

## 2016-02-08 DIAGNOSIS — R0602 Shortness of breath: Secondary | ICD-10-CM | POA: Diagnosis not present

## 2016-02-08 DIAGNOSIS — I10 Essential (primary) hypertension: Secondary | ICD-10-CM | POA: Diagnosis not present

## 2016-02-08 DIAGNOSIS — I4891 Unspecified atrial fibrillation: Secondary | ICD-10-CM | POA: Diagnosis not present

## 2016-02-08 DIAGNOSIS — E782 Mixed hyperlipidemia: Secondary | ICD-10-CM | POA: Diagnosis not present

## 2016-02-09 DIAGNOSIS — G4733 Obstructive sleep apnea (adult) (pediatric): Secondary | ICD-10-CM | POA: Diagnosis not present

## 2016-02-18 DIAGNOSIS — T148XXA Other injury of unspecified body region, initial encounter: Secondary | ICD-10-CM | POA: Diagnosis not present

## 2016-02-18 DIAGNOSIS — I10 Essential (primary) hypertension: Secondary | ICD-10-CM | POA: Diagnosis not present

## 2016-02-18 DIAGNOSIS — E782 Mixed hyperlipidemia: Secondary | ICD-10-CM | POA: Diagnosis not present

## 2016-02-18 DIAGNOSIS — N390 Urinary tract infection, site not specified: Secondary | ICD-10-CM | POA: Diagnosis not present

## 2016-02-18 DIAGNOSIS — E1121 Type 2 diabetes mellitus with diabetic nephropathy: Secondary | ICD-10-CM | POA: Diagnosis not present

## 2016-02-20 DIAGNOSIS — I4891 Unspecified atrial fibrillation: Secondary | ICD-10-CM | POA: Diagnosis not present

## 2016-03-03 DIAGNOSIS — E782 Mixed hyperlipidemia: Secondary | ICD-10-CM | POA: Diagnosis not present

## 2016-03-03 DIAGNOSIS — I10 Essential (primary) hypertension: Secondary | ICD-10-CM | POA: Diagnosis not present

## 2016-03-03 DIAGNOSIS — R300 Dysuria: Secondary | ICD-10-CM | POA: Diagnosis not present

## 2016-03-03 DIAGNOSIS — E1122 Type 2 diabetes mellitus with diabetic chronic kidney disease: Secondary | ICD-10-CM | POA: Diagnosis not present

## 2016-03-10 DIAGNOSIS — D485 Neoplasm of uncertain behavior of skin: Secondary | ICD-10-CM | POA: Diagnosis not present

## 2016-03-10 DIAGNOSIS — C44329 Squamous cell carcinoma of skin of other parts of face: Secondary | ICD-10-CM | POA: Diagnosis not present

## 2016-03-11 DIAGNOSIS — G4733 Obstructive sleep apnea (adult) (pediatric): Secondary | ICD-10-CM | POA: Diagnosis not present

## 2016-03-19 ENCOUNTER — Encounter: Payer: Self-pay | Admitting: General Surgery

## 2016-03-19 ENCOUNTER — Ambulatory Visit (INDEPENDENT_AMBULATORY_CARE_PROVIDER_SITE_OTHER): Payer: PPO | Admitting: General Surgery

## 2016-03-19 VITALS — BP 132/74 | HR 66 | Resp 16 | Ht 63.0 in | Wt 129.0 lb

## 2016-03-19 DIAGNOSIS — C4442 Squamous cell carcinoma of skin of scalp and neck: Secondary | ICD-10-CM

## 2016-03-19 DIAGNOSIS — IMO0002 Reserved for concepts with insufficient information to code with codable children: Secondary | ICD-10-CM

## 2016-03-19 NOTE — Progress Notes (Signed)
Patient ID: Sarah Dougherty, female   DOB: July 13, 1921, 82 y.o.   MRN: [id number]  Chief Complaint  Patient presents with  . Other    HPI Sarah Dougherty is a 82 y.o. female here today for a evaluation of skin cancer on her cheek.She noticed this four months agoPatient states she noticed had this area about a month ago. Biopsy done on 03/17/2016. Son [name] is present.  HPI  Past Medical History:  Diagnosis Date  . Coronary artery disease   . Diabetes mellitus without complication ([place])   . Hypertension   . On home oxygen therapy    uses at night    Past Surgical History:  Procedure Laterality Date  . PACEMAKER INSERTION Left 12/12/2014   Procedure: INSERTION PACEMAKER;  Surgeon: Isaias Cowman, MD;  Location: [facility] ORS;  Service: Cardiovascular;  Laterality: Left;    No family history on file.  Social History Social History  Substance Use Topics  . Smoking status: Never Smoker  . Smokeless tobacco: Not on file  . Alcohol use No    Allergies  Allergen Reactions  . Nitrofurantoin     Other reaction(s): Unknown  . Penicillins     Other reaction(s): Unknown  . Sulfa Antibiotics     Other reaction(s): Unknown  . Azithromycin Rash    Current Outpatient Prescriptions  Medication Sig Dispense Refill  . acetaminophen (TYLENOL 8 HOUR) 650 MG CR tablet Take 650 mg by mouth every 8 (eight) hours as needed for pain.    Marland Kitchen amLODipine (NORVASC) 5 MG tablet Take 5 mg by mouth daily.    . ciprofloxacin (CIPRO) 250 MG tablet Take 1 tablet (250 mg total) by mouth 2 (two) times daily. 8 tablet 0  . clotrimazole (GYNE-LOTRIMIN) 1 % vaginal cream Place 1 application vaginally daily as needed. For yeast infection.    Marland Kitchen glimepiride (AMARYL) 2 MG tablet Take 2 mg by mouth daily.    . hydrocortisone cream (NEOSPORIN ECZEMA ESSENT MAX ST) 1 % Apply 1 application topically 2 (two) times daily as needed for itching (for sores.).    Marland Kitchen Rivaroxaban (XARELTO PO) Take 1 tablet by mouth daily.      . sodium chloride (OCEAN) 0.65 % nasal spray Place 2 sprays into the nose daily as needed.     No current facility-administered medications for this visit.     Review of Systems Review of Systems  Constitutional: Negative.   Respiratory: Negative.   Cardiovascular: Negative.     Blood pressure 132/74, pulse 66, resp. rate 16, height 5\' 3"  (1.6 m), weight 129 lb (58.5 kg).  Physical Exam Physical Exam  Constitutional: She is oriented to person, place, and time. She appears well-developed and well-nourished.  HENT:  Head:    Eyes: Conjunctivae are normal. No scleral icterus.  Neck: Neck supple.  Cardiovascular: Normal rate and regular rhythm.   Murmur heard.  Systolic murmur is present with a grade of 2/6  Pulmonary/Chest: Effort normal and breath sounds normal.  Lymphadenopathy:       Head (left side): No submandibular and no posterior auricular adenopathy present.    She has no cervical adenopathy.       Left: No supraclavicular adenopathy present.  Neurological: She is alert and oriented to person, place, and time.  Skin: Skin is warm and dry.    Data Reviewed Shave biopsy pleated 10/17/2014 showed squamous cell carcinoma in situ extending to the edge and base. Completed by Dr. Marolyn Hammock. This was followed by curettage  and electrodesiccation. The size of the lesion was described as being 1.4 cm after curettage.  Dermatology notes dated 01/08/2016 reported no evidence of clinical recurrence.  Dermatology notes of nose 04/09/2016 reported a lesion in the left jaw area enlarging for 2 months. Described as a 1.7 cm lesion and a shave biopsy was completed. Aluminum chloride and electrocautery for hemostasis. Biopsy showed poorly differentiated invasive squamous cell carcinoma with desmoplastic/infiltrative process extending to the edge and base.    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    Plan    Excision was recommended. The  patient is on Xarelto, but with the superficial location this still should be safely completed. She was amenable to proceed.  The area was cleansed with alcohol followed by 10 mL of 0.5% Xylocaine with 0.25% Marcaine with 1-200,000 of epinephrine. The area was prepped with ChloraPrep and draped. Through elliptical incision that followed the angle of the jaw and extending just to the base of the ear the area was excised. Focal thickening along the line of incision on the anterior superior aspect was noted, but no gross residual tumor was identified. The specimen was tagged with a 3-0 Vicryl suture at the midportion, superior aspect labeled 12:00.  A single small bleeding point was controlled with a 3-0 Vicryl figure-of-eight suture. The deep tissue was approximated with interrupted 3-0 Vicryl sutures. This provided good apposition to the skin. The skin was then closed with a running 5-0 Prolene simple suture. Telfa and Tegaderm dressing applied.  The procedure was well tolerated. The patient will make use of ice intermittently to the area overnight for comfort, Tylenol if needed for pain.    Return in one week for physician assessment.  This information has been scribed by Gaspar Cola CMA.  Robert Bellow 03/19/2016, 5:30 PM

## 2016-03-19 NOTE — Patient Instructions (Signed)
Return in one week nurse check

## 2016-03-20 ENCOUNTER — Ambulatory Visit (INDEPENDENT_AMBULATORY_CARE_PROVIDER_SITE_OTHER): Payer: PPO | Admitting: General Surgery

## 2016-03-20 DIAGNOSIS — C4492 Squamous cell carcinoma of skin, unspecified: Secondary | ICD-10-CM

## 2016-03-20 DIAGNOSIS — IMO0002 Reserved for concepts with insufficient information to code with codable children: Secondary | ICD-10-CM

## 2016-03-20 NOTE — Progress Notes (Signed)
The patient was found to have set a stream of bleeding from the most superior aspect of the incision site on the left neck. 6 mL of 0.5% Xylocaine with 0.25% Marcaine with 1-200,000 units of epinephrine was instilled. A single 4-0 Prolene figure-of-eight suture was used and good hemostasis noted. No evidence of underlying hematoma. The area was observed for 15 minutes and no further bleeding was appreciated.  Bacitracin and Telfa pad dressing applied.  Follow-up next week as planned.

## 2016-03-27 ENCOUNTER — Ambulatory Visit (INDEPENDENT_AMBULATORY_CARE_PROVIDER_SITE_OTHER): Payer: PPO | Admitting: General Surgery

## 2016-03-27 VITALS — BP 112/52 | HR 88 | Resp 18 | Ht 60.0 in | Wt 130.0 lb

## 2016-03-27 DIAGNOSIS — IMO0002 Reserved for concepts with insufficient information to code with codable children: Secondary | ICD-10-CM

## 2016-03-27 DIAGNOSIS — C4492 Squamous cell carcinoma of skin, unspecified: Secondary | ICD-10-CM

## 2016-03-27 NOTE — Progress Notes (Signed)
Patient ID: Sarah Dougherty, female   DOB: 31-Oct-1921, 82 y.o.   MRN: [id number]  Chief Complaint  Patient presents with  . Follow-up    left nreck mass    HPI Sarah Dougherty is a 82 y.o. female here today for her follow up neck mass excision done on 03/19/2016. Patient states she is doing well. HPI  Past Medical History:  Diagnosis Date  . Coronary artery disease   . Diabetes mellitus without complication ([place])   . Hypertension   . On home oxygen therapy    uses at night    Past Surgical History:  Procedure Laterality Date  . PACEMAKER INSERTION Left 12/12/2014   Procedure: INSERTION PACEMAKER;  Surgeon: Isaias Cowman, MD;  Location: [facility] ORS;  Service: Cardiovascular;  Laterality: Left;    No family history on file.  Social History Social History  Substance Use Topics  . Smoking status: Never Smoker  . Smokeless tobacco: Not on file  . Alcohol use No    Allergies  Allergen Reactions  . Nitrofurantoin     Other reaction(s): Unknown  . Penicillins     Other reaction(s): Unknown  . Sulfa Antibiotics     Other reaction(s): Unknown  . Azithromycin Rash    Current Outpatient Prescriptions  Medication Sig Dispense Refill  . acetaminophen (TYLENOL 8 HOUR) 650 MG CR tablet Take 650 mg by mouth every 8 (eight) hours as needed for pain.    Marland Kitchen amLODipine (NORVASC) 5 MG tablet Take 5 mg by mouth daily.    . ciprofloxacin (CIPRO) 250 MG tablet Take 1 tablet (250 mg total) by mouth 2 (two) times daily. 8 tablet 0  . clotrimazole (GYNE-LOTRIMIN) 1 % vaginal cream Place 1 application vaginally daily as needed. For yeast infection.    Marland Kitchen glimepiride (AMARYL) 2 MG tablet Take 2 mg by mouth daily.    . hydrocortisone cream (NEOSPORIN ECZEMA ESSENT MAX ST) 1 % Apply 1 application topically 2 (two) times daily as needed for itching (for sores.).    Marland Kitchen Rivaroxaban (XARELTO PO) Take 1 tablet by mouth daily.    . sodium chloride (OCEAN) 0.65 % nasal spray Place 2 sprays into the  nose daily as needed.     No current facility-administered medications for this visit.     Review of Systems Review of Systems  Blood pressure (!) 112/52, pulse 88, resp. rate 18, height 5' (1.524 m), weight 130 lb (59 kg).  Physical Exam Physical Exam  HENT:  Head:      Data Reviewed Skin (M), left neck, excision - RESIDUAL POORLY DIFFERENTIATED SQUAMOUS CELL CARCINOMA, MARGINS FREE OF INVASIVE CARCINOMA - PERIPHERAL MARGIN (3 O'CLOCK) FOCALLY INVOLVED BY IN-SITU CARCINOMA  T1, Nx. This margin is nearest the ear.   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 Microscopic foci of in situ carcinoma.    Plan    Sutures were removed without incident and replaced with benzoin and Steri-Strips. Patient may shower/wash her hair starting tomorrow.  Reviewed pathology with the patient and her son. We'll reassess the area in 6 weeks. Likely no need for reexcision of the 3:00 margin.  No cervical adenopathy noted at the time of her initial evaluation. We'll reassess at follow-up.    Return in six weeks. This information has been scribed by Sarah Dougherty CMA.    Robert Bellow 03/27/2016, 1:39 PM

## 2016-03-27 NOTE — Patient Instructions (Signed)
Return in six weeks

## 2016-04-03 DIAGNOSIS — E782 Mixed hyperlipidemia: Secondary | ICD-10-CM | POA: Diagnosis not present

## 2016-04-03 DIAGNOSIS — R05 Cough: Secondary | ICD-10-CM | POA: Diagnosis not present

## 2016-04-03 DIAGNOSIS — E1121 Type 2 diabetes mellitus with diabetic nephropathy: Secondary | ICD-10-CM | POA: Diagnosis not present

## 2016-04-03 DIAGNOSIS — I10 Essential (primary) hypertension: Secondary | ICD-10-CM | POA: Diagnosis not present

## 2016-04-07 ENCOUNTER — Inpatient Hospital Stay
Admission: EM | Admit: 2016-04-07 | Discharge: 2016-04-10 | DRG: 190 | Disposition: A | Discharge status: Skilled Nursing Facility | Payer: PPO | Attending: Internal Medicine | Admitting: Internal Medicine

## 2016-04-07 ENCOUNTER — Encounter: Payer: Self-pay | Admitting: Emergency Medicine

## 2016-04-07 ENCOUNTER — Emergency Department: Payer: PPO

## 2016-04-07 DIAGNOSIS — E876 Hypokalemia: Secondary | ICD-10-CM | POA: Diagnosis present

## 2016-04-07 DIAGNOSIS — Z882 Allergy status to sulfonamides status: Secondary | ICD-10-CM | POA: Diagnosis not present

## 2016-04-07 DIAGNOSIS — R29810 Facial weakness: Secondary | ICD-10-CM

## 2016-04-07 DIAGNOSIS — R05 Cough: Secondary | ICD-10-CM | POA: Diagnosis not present

## 2016-04-07 DIAGNOSIS — R2681 Unsteadiness on feet: Secondary | ICD-10-CM

## 2016-04-07 DIAGNOSIS — E119 Type 2 diabetes mellitus without complications: Secondary | ICD-10-CM | POA: Diagnosis present

## 2016-04-07 DIAGNOSIS — Z792 Long term (current) use of antibiotics: Secondary | ICD-10-CM | POA: Diagnosis not present

## 2016-04-07 DIAGNOSIS — J440 Chronic obstructive pulmonary disease with acute lower respiratory infection: Secondary | ICD-10-CM | POA: Diagnosis not present

## 2016-04-07 DIAGNOSIS — N179 Acute kidney failure, unspecified: Secondary | ICD-10-CM | POA: Diagnosis present

## 2016-04-07 DIAGNOSIS — Z880 Allergy status to penicillin: Secondary | ICD-10-CM | POA: Diagnosis not present

## 2016-04-07 DIAGNOSIS — I482 Chronic atrial fibrillation: Secondary | ICD-10-CM | POA: Diagnosis present

## 2016-04-07 DIAGNOSIS — I4891 Unspecified atrial fibrillation: Secondary | ICD-10-CM | POA: Diagnosis not present

## 2016-04-07 DIAGNOSIS — Z7901 Long term (current) use of anticoagulants: Secondary | ICD-10-CM | POA: Diagnosis not present

## 2016-04-07 DIAGNOSIS — Z9981 Dependence on supplemental oxygen: Secondary | ICD-10-CM | POA: Diagnosis not present

## 2016-04-07 DIAGNOSIS — Z79899 Other long term (current) drug therapy: Secondary | ICD-10-CM

## 2016-04-07 DIAGNOSIS — Z950 Presence of cardiac pacemaker: Secondary | ICD-10-CM | POA: Diagnosis not present

## 2016-04-07 DIAGNOSIS — I10 Essential (primary) hypertension: Secondary | ICD-10-CM | POA: Diagnosis not present

## 2016-04-07 DIAGNOSIS — J9601 Acute respiratory failure with hypoxia: Secondary | ICD-10-CM | POA: Diagnosis not present

## 2016-04-07 DIAGNOSIS — M6281 Muscle weakness (generalized): Secondary | ICD-10-CM | POA: Diagnosis not present

## 2016-04-07 DIAGNOSIS — J441 Chronic obstructive pulmonary disease with (acute) exacerbation: Secondary | ICD-10-CM | POA: Diagnosis not present

## 2016-04-07 DIAGNOSIS — J9621 Acute and chronic respiratory failure with hypoxia: Secondary | ICD-10-CM | POA: Diagnosis not present

## 2016-04-07 DIAGNOSIS — R262 Difficulty in walking, not elsewhere classified: Secondary | ICD-10-CM | POA: Diagnosis not present

## 2016-04-07 DIAGNOSIS — Z7984 Long term (current) use of oral hypoglycemic drugs: Secondary | ICD-10-CM

## 2016-04-07 DIAGNOSIS — G4733 Obstructive sleep apnea (adult) (pediatric): Secondary | ICD-10-CM | POA: Diagnosis not present

## 2016-04-07 DIAGNOSIS — R0602 Shortness of breath: Secondary | ICD-10-CM | POA: Diagnosis not present

## 2016-04-07 DIAGNOSIS — R1312 Dysphagia, oropharyngeal phase: Secondary | ICD-10-CM | POA: Diagnosis not present

## 2016-04-07 DIAGNOSIS — J209 Acute bronchitis, unspecified: Secondary | ICD-10-CM | POA: Diagnosis not present

## 2016-04-07 DIAGNOSIS — Z833 Family history of diabetes mellitus: Secondary | ICD-10-CM | POA: Diagnosis not present

## 2016-04-07 DIAGNOSIS — I2510 Atherosclerotic heart disease of native coronary artery without angina pectoris: Secondary | ICD-10-CM | POA: Diagnosis present

## 2016-04-07 DIAGNOSIS — R001 Bradycardia, unspecified: Secondary | ICD-10-CM | POA: Diagnosis not present

## 2016-04-07 DIAGNOSIS — Z881 Allergy status to other antibiotic agents status: Secondary | ICD-10-CM | POA: Diagnosis not present

## 2016-04-07 HISTORY — DX: Chronic obstructive pulmonary disease, unspecified: J44.9

## 2016-04-07 LAB — CBC WITH DIFFERENTIAL/PLATELET
BASOS PCT: 1 %
Basophils Absolute: 0 10*3/uL (ref 0–0.1)
EOS ABS: 0 10*3/uL (ref 0–0.7)
Eosinophils Relative: 0 %
HCT: 36.6 % (ref 35.0–47.0)
Hemoglobin: 12.7 g/dL (ref 12.0–16.0)
Lymphocytes Relative: 21 %
Lymphs Abs: 1 10*3/uL (ref 1.0–3.6)
MCH: 32 pg (ref 26.0–34.0)
MCHC: 34.6 g/dL (ref 32.0–36.0)
MCV: 92.6 fL (ref 80.0–100.0)
MONO ABS: 0.9 10*3/uL (ref 0.2–0.9)
MONOS PCT: 17 %
Neutro Abs: 3.1 10*3/uL (ref 1.4–6.5)
Neutrophils Relative %: 61 %
Platelets: 163 10*3/uL (ref 150–440)
RBC: 3.95 MIL/uL (ref 3.80–5.20)
RDW: 13.9 % (ref 11.5–14.5)
WBC: 5 10*3/uL (ref 3.6–11.0)

## 2016-04-07 LAB — COMPREHENSIVE METABOLIC PANEL
ALBUMIN: 3.9 g/dL (ref 3.5–5.0)
ALT: 18 U/L (ref 14–54)
ANION GAP: 11 (ref 5–15)
AST: 35 U/L (ref 15–41)
Alkaline Phosphatase: 62 U/L (ref 38–126)
BILIRUBIN TOTAL: 2.4 mg/dL — AB (ref 0.3–1.2)
BUN: 25 mg/dL — ABNORMAL HIGH (ref 6–20)
CO2: 22 mmol/L (ref 22–32)
Calcium: 8.6 mg/dL — ABNORMAL LOW (ref 8.9–10.3)
Chloride: 102 mmol/L (ref 101–111)
Creatinine, Ser: 1.22 mg/dL — ABNORMAL HIGH (ref 0.44–1.00)
GFR calc non Af Amer: 37 mL/min — ABNORMAL LOW (ref >60)
GFR, EST AFRICAN AMERICAN: 43 mL/min — AB (ref >60)
GLUCOSE: 121 mg/dL — AB (ref 65–99)
POTASSIUM: 3.3 mmol/L — AB (ref 3.5–5.1)
SODIUM: 135 mmol/L (ref 135–145)
TOTAL PROTEIN: 6.7 g/dL (ref 6.5–8.1)

## 2016-04-07 LAB — TROPONIN I

## 2016-04-07 LAB — MAGNESIUM: MAGNESIUM: 1.6 mg/dL — AB (ref 1.7–2.4)

## 2016-04-07 LAB — INFLUENZA PANEL BY PCR (TYPE A & B)
INFLBPCR: NEGATIVE
Influenza A By PCR: NEGATIVE

## 2016-04-07 MED ORDER — AMLODIPINE BESYLATE 5 MG PO TABS
5.0000 mg | ORAL_TABLET | Freq: Every day | ORAL | Status: DC
  Administered 2016-04-08 – 2016-04-10 (×3): 5 mg via ORAL
  Filled 2016-04-07 (×3): qty 1

## 2016-04-07 MED ORDER — IPRATROPIUM-ALBUTEROL 0.5-2.5 (3) MG/3ML IN SOLN
3.0000 mL | Freq: Once | RESPIRATORY_TRACT | Status: AC
  Administered 2016-04-07: 3 mL via RESPIRATORY_TRACT
  Filled 2016-04-07: qty 3

## 2016-04-07 MED ORDER — DEXTROSE 5 % IV SOLN: 1.0000 g | INTRAVENOUS | Status: DC

## 2016-04-07 MED ORDER — IPRATROPIUM-ALBUTEROL 0.5-2.5 (3) MG/3ML IN SOLN
3.0000 mL | RESPIRATORY_TRACT | Status: DC
  Administered 2016-04-07 – 2016-04-10 (×17): 3 mL via RESPIRATORY_TRACT
  Filled 2016-04-07 (×16): qty 3

## 2016-04-07 MED ORDER — ALBUTEROL SULFATE (2.5 MG/3ML) 0.083% IN NEBU
5.0000 mg | INHALATION_SOLUTION | Freq: Once | RESPIRATORY_TRACT | Status: AC
  Administered 2016-04-07: 5 mg via RESPIRATORY_TRACT
  Filled 2016-04-07: qty 6

## 2016-04-07 MED ORDER — METHYLPREDNISOLONE SODIUM SUCC 125 MG IJ SOLR
INTRAMUSCULAR | Status: AC
  Filled 2016-04-07: qty 2

## 2016-04-07 MED ORDER — HYDROCOD POLST-CPM POLST ER 10-8 MG/5ML PO SUER
2.0000 mL | Freq: Once | ORAL | Status: AC
  Administered 2016-04-07: 2 mL via ORAL

## 2016-04-07 MED ORDER — SODIUM CHLORIDE 0.9 % IV BOLUS (SEPSIS)
500.0000 mL | Freq: Once | INTRAVENOUS | Status: AC
  Administered 2016-04-07: 500 mL via INTRAVENOUS

## 2016-04-07 MED ORDER — SALINE NASAL SPRAY 0.65 % NA SOLN
2.0000 | Freq: Every day | NASAL | Status: DC | PRN
  Filled 2016-04-07: qty 45

## 2016-04-07 MED ORDER — DEXAMETHASONE SODIUM PHOSPHATE 10 MG/ML IJ SOLN
10.0000 mg | Freq: Once | INTRAMUSCULAR | Status: AC
  Administered 2016-04-07: 10 mg via INTRAVENOUS
  Filled 2016-04-07: qty 1

## 2016-04-07 MED ORDER — IPRATROPIUM-ALBUTEROL 0.5-2.5 (3) MG/3ML IN SOLN
RESPIRATORY_TRACT | Status: AC
  Filled 2016-04-07: qty 3

## 2016-04-07 MED ORDER — BUDESONIDE 0.25 MG/2ML IN SUSP
RESPIRATORY_TRACT | Status: AC
  Filled 2016-04-07: qty 2

## 2016-04-07 MED ORDER — HYDROCOD POLST-CPM POLST ER 10-8 MG/5ML PO SUER
5.0000 mL | Freq: Once | ORAL | Status: DC
  Filled 2016-04-07: qty 5

## 2016-04-07 MED ORDER — CEFTRIAXONE SODIUM-DEXTROSE 1-3.74 GM-% IV SOLR: 1.0000 g | INTRAVENOUS | Status: DC

## 2016-04-07 MED ORDER — CEFTRIAXONE SODIUM-DEXTROSE 1-3.74 GM-% IV SOLR
1.0000 g | Freq: Once | INTRAVENOUS | Status: AC
  Administered 2016-04-07: 1 g via INTRAVENOUS
  Filled 2016-04-07: qty 50

## 2016-04-07 MED ORDER — ACETAMINOPHEN 325 MG PO TABS
650.0000 mg | ORAL_TABLET | Freq: Four times a day (QID) | ORAL | Status: DC | PRN
  Administered 2016-04-07 – 2016-04-10 (×5): 650 mg via ORAL
  Filled 2016-04-07 (×5): qty 2

## 2016-04-07 MED ORDER — BUDESONIDE 0.25 MG/2ML IN SUSP
0.2500 mg | Freq: Two times a day (BID) | RESPIRATORY_TRACT | Status: DC
  Administered 2016-04-07 – 2016-04-10 (×6): 0.25 mg via RESPIRATORY_TRACT
  Filled 2016-04-07 (×5): qty 2

## 2016-04-07 MED ORDER — MAGNESIUM SULFATE 2 GM/50ML IV SOLN
2.0000 g | Freq: Once | INTRAVENOUS | Status: DC
  Filled 2016-04-07: qty 50

## 2016-04-07 MED ORDER — TIOTROPIUM BROMIDE MONOHYDRATE 18 MCG IN CAPS
18.0000 ug | ORAL_CAPSULE | Freq: Every day | RESPIRATORY_TRACT | Status: DC
  Administered 2016-04-08 – 2016-04-10 (×3): 18 ug via RESPIRATORY_TRACT
  Filled 2016-04-07: qty 5

## 2016-04-07 MED ORDER — RIVAROXABAN 15 MG PO TABS
15.0000 mg | ORAL_TABLET | Freq: Every day | ORAL | Status: DC
  Administered 2016-04-08 – 2016-04-09 (×2): 15 mg via ORAL
  Filled 2016-04-07 (×2): qty 1

## 2016-04-07 MED ORDER — GLIMEPIRIDE 2 MG PO TABS
2.0000 mg | ORAL_TABLET | Freq: Every day | ORAL | Status: DC
  Administered 2016-04-08 – 2016-04-10 (×3): 2 mg via ORAL
  Filled 2016-04-07 (×3): qty 1

## 2016-04-07 MED ORDER — HEPARIN SODIUM (PORCINE) 5000 UNIT/ML IJ SOLN
5000.0000 [IU] | Freq: Three times a day (TID) | INTRAMUSCULAR | Status: DC
  Administered 2016-04-07: 23:00:00 5000 [IU] via SUBCUTANEOUS
  Filled 2016-04-07: qty 1

## 2016-04-07 MED ORDER — METHYLPREDNISOLONE SODIUM SUCC 125 MG IJ SOLR
60.0000 mg | Freq: Four times a day (QID) | INTRAMUSCULAR | Status: DC
  Administered 2016-04-07 – 2016-04-08 (×3): 60 mg via INTRAVENOUS
  Filled 2016-04-07 (×2): qty 2

## 2016-04-07 MED ORDER — HYDROCORTISONE 1 % EX CREA
1.0000 "application " | TOPICAL_CREAM | Freq: Two times a day (BID) | CUTANEOUS | Status: DC | PRN
  Filled 2016-04-07: qty 28

## 2016-04-07 MED ORDER — GUAIFENESIN 100 MG/5ML PO SOLN
5.0000 mL | ORAL | Status: DC | PRN
  Administered 2016-04-07 – 2016-04-09 (×5): 100 mg via ORAL
  Filled 2016-04-07 (×5): qty 10

## 2016-04-07 NOTE — ED Notes (Signed)
Family states pt has been in antibiotics since 12/21. Pt states she has two pills left. But cough not getting better. Pt is coughing yellow phlem up since being in ED. Family states she is sore in her back from coughing.   Pt wears oxygen at night per family.

## 2016-04-07 NOTE — ED Notes (Addendum)
Langley Gauss, RN and this RN got pt up to walk her around room with walker. Pt oxygen dropped to 85% RA. Pt began coughing more with movement. Dr. Quentin Cornwall updated.

## 2016-04-07 NOTE — Progress Notes (Signed)
Family Meeting Note  Advance Directive:yes  Today a meeting took place with the Patient and son- HCPOA.  The following clinical team members were present during this meeting:MD  The following were discussed:Patient's diagnosis: copd, CAD, Patient's progosis: Unable to determine and Goals for treatment: Continue present management, full code.  Additional follow-up to be provided: with PMD in office  Time spent during discussion:20 minutes  Karisha Marlin, Rosalio Macadamia, MD

## 2016-04-07 NOTE — ED Notes (Signed)
Pt given grape juice, sat up in bed to drink it.

## 2016-04-07 NOTE — ED Notes (Signed)
Pt lying on side, pillows behind back and head since pt c/o of back pain from coughing and lying on stretcher.

## 2016-04-07 NOTE — ED Provider Notes (Signed)
[facility] Emergency Department Provider Note    First MD Initiated Contact with Patient 04/07/16 1511     (approximate)  I have reviewed the triage vital signs and the nursing notes.   HISTORY  Chief Complaint Cough    HPI ANDREAN HATHWAY is a 82 y.o. female the history of coronary, diabetes as well as chronic bronchitis presents with worsening cough for the past 4 days. Patient saw her family care physician on Friday and was started on Levaquin. States that her symptoms that shortness of breath in addition to week generalized weakness have worsened over the past several days. Denies any measured fevers or chills. Denies any worsening orthopnea. States that the cough has gotten worse. It is a nonproductive cough. States that her son who lives with her is also sick with similar symptoms.   Past Medical History:  Diagnosis Date  . Coronary artery disease   . Diabetes mellitus without complication ([place])   . Hypertension   . On home oxygen therapy    uses at night   History reviewed. No pertinent family history. Past Surgical History:  Procedure Laterality Date  . PACEMAKER INSERTION Left 12/12/2014   Procedure: INSERTION PACEMAKER;  Surgeon: Isaias Cowman, MD;  Location: [facility] ORS;  Service: Cardiovascular;  Laterality: Left;   Patient Active Problem List   Diagnosis Date Noted  . Squamous cell carcinoma 03/19/2016  . Bradycardia 12/10/2014  . COPD (chronic obstructive pulmonary disease) ([place]) 12/09/2014      Prior to Admission medications   Medication Sig Start Date End Date Taking? Authorizing Provider  acetaminophen (TYLENOL 8 HOUR) 650 MG CR tablet Take 650 mg by mouth every 8 (eight) hours as needed for pain.   Yes Historical Provider, MD  amLODipine (NORVASC) 5 MG tablet Take 5 mg by mouth daily. 11/13/14  Yes Historical Provider, MD  ciprofloxacin (CIPRO) 250 MG tablet Take 1 tablet (250 mg total) by mouth 2 (two) times daily. 12/13/14   Yes Aldean Jewett, MD  clotrimazole (GYNE-LOTRIMIN) 1 % vaginal cream Place 1 application vaginally daily as needed. For yeast infection. 11/15/14  Yes Historical Provider, MD  glimepiride (AMARYL) 2 MG tablet Take 2 mg by mouth daily. 10/18/14  Yes Historical Provider, MD  hydrocortisone cream (NEOSPORIN ECZEMA ESSENT MAX ST) 1 % Apply 1 application topically 2 (two) times daily as needed for itching (for sores.).   Yes Historical Provider, MD  Rivaroxaban (XARELTO PO) Take 1 tablet by mouth daily.   Yes Historical Provider, MD  sodium chloride (OCEAN) 0.65 % nasal spray Place 2 sprays into the nose daily as needed. 09/14/14  Yes Historical Provider, MD    Allergies Nitrofurantoin; Penicillins; Sulfa antibiotics; and Azithromycin    Social History Social History  Substance Use Topics  . Smoking status: Never Smoker  . Smokeless tobacco: Never Used  . Alcohol use No    Review of Systems Patient denies headaches, rhinorrhea, blurry vision, numbness, shortness of breath, chest pain, edema, cough, abdominal pain, nausea, vomiting, diarrhea, dysuria, fevers, rashes or hallucinations unless otherwise stated above in HPI. ____________________________________________   PHYSICAL EXAM:  VITAL SIGNS: Vitals:   04/07/16 1600 04/07/16 1630  BP: (!) 115/55 (!) 101/47  Pulse: 60 (!) 59  Resp:  (!) 26  Temp:      Constitutional: Alert and oriented. Elderly and frail appearing but in no acute distress. Eyes: right eye is matted (chronic)  EOMI Head: Atraumatic. Nose: No congestion/rhinnorhea. Mouth/Throat: Mucous membranes are moist.  Oropharynx non-erythematous. Neck: No stridor. Painless ROM. No cervical spine tenderness to palpation Hematological/Lymphatic/Immunilogical: No cervical lymphadenopathy. Cardiovascular: Normal rate, regular rhythm. Grossly normal heart sounds.  Good peripheral circulation. Respiratory: diffuse expiratory wheezing, non productive cough,  + use of accessory  muscles, weak voice  Gastrointestinal: Soft and nontender. No distention. No abdominal bruits. No CVA tenderness. Musculoskeletal: No lower extremity tenderness nor edema.  No joint effusions. Neurologic:  Normal speech and language. No gross focal neurologic deficits are appreciated. No gait instability. Skin:  Skin is warm, dry and intact. No rash noted. Psychiatric: Mood and affect are normal. Speech and behavior are normal.  ____________________________________________   LABS (all labs ordered are listed, but only abnormal results are displayed)  Results for orders placed or performed during the hospital encounter of 04/07/16 (from the past 24 hour(s))  CBC with Differential/Platelet     Status: None   Collection Time: 04/07/16  3:39 PM  Result Value Ref Range   WBC 5.0 3.6 - 11.0 K/uL   RBC 3.95 3.80 - 5.20 MIL/uL   Hemoglobin 12.7 12.0 - 16.0 g/dL   HCT 36.6 35.0 - 47.0 %   MCV 92.6 80.0 - 100.0 fL   MCH 32.0 26.0 - 34.0 pg   MCHC 34.6 32.0 - 36.0 g/dL   RDW 13.9 11.5 - 14.5 %   Platelets 163 150 - 440 K/uL   Neutrophils Relative % 61 %   Neutro Abs 3.1 1.4 - 6.5 K/uL   Lymphocytes Relative 21 %   Lymphs Abs 1.0 1.0 - 3.6 K/uL   Monocytes Relative 17 %   Monocytes Absolute 0.9 0.2 - 0.9 K/uL   Eosinophils Relative 0 %   Eosinophils Absolute 0.0 0 - 0.7 K/uL   Basophils Relative 1 %   Basophils Absolute 0.0 0 - 0.1 K/uL  Comprehensive metabolic panel     Status: Abnormal   Collection Time: 04/07/16  3:39 PM  Result Value Ref Range   Sodium 135 135 - 145 mmol/L   Potassium 3.3 (L) 3.5 - 5.1 mmol/L   Chloride 102 101 - 111 mmol/L   CO2 22 22 - 32 mmol/L   Glucose, Bld 121 (H) 65 - 99 mg/dL   BUN 25 (H) 6 - 20 mg/dL   Creatinine, Ser 1.22 (H) 0.44 - 1.00 mg/dL   Calcium 8.6 (L) 8.9 - 10.3 mg/dL   Total Protein 6.7 6.5 - 8.1 g/dL   Albumin 3.9 3.5 - 5.0 g/dL   AST 35 15 - 41 U/L   ALT 18 14 - 54 U/L   Alkaline Phosphatase 62 38 - 126 U/L   Total Bilirubin 2.4 (H)  0.3 - 1.2 mg/dL   GFR calc non Af Amer 37 (L) >60 mL/min   GFR calc Af Amer 43 (L) >60 mL/min   Anion gap 11 5 - 15  Influenza panel by PCR (type A & B, H1N1)     Status: None   Collection Time: 04/07/16  3:39 PM  Result Value Ref Range   Influenza A By PCR NEGATIVE NEGATIVE   Influenza B By PCR NEGATIVE NEGATIVE  Magnesium     Status: Abnormal   Collection Time: 04/07/16  3:39 PM  Result Value Ref Range   Magnesium 1.6 (L) 1.7 - 2.4 mg/dL   ____________________________________________  EKG My review and personal interpretation at Time: 16:14   Indication: cough  Rate: 80  Rhythm: A-paced rhythm Axis: normal Other: nonspecific st changes, no STEMI ____________________________________________  RADIOLOGY  I personally reviewed all radiographic images ordered to evaluate for the above acute complaints and reviewed radiology reports and findings.  These findings were personally discussed with the patient.  Please see medical record for radiology report. ____________________________________________   PROCEDURES  Procedure(s) performed:  Procedures    Critical Care performed: no ____________________________________________   INITIAL IMPRESSION / ASSESSMENT AND PLAN / ED COURSE  Pertinent labs & imaging results that were available during my care of the patient were reviewed by me and considered in my medical decision making (see chart for details).  DDX: Asthma, copd, CHF, pna, ptx, malignancy, Pe, anemia   ENGLISH ACKER is a 82 y.o. who presents to the ED with several days of progressive worsening cough and shortness of breath. Patient with a history of chronic bronchitis. Patient arrives afebrile and he mechanically stable. Chest x-ray ordered due to concern for possible pneumonia failing outpatient therapy on Levaquin. Her exam however is more concerning for acute bronchitis. Not Will provide nebulizer treatments, check labs and reassess.   Clinical Course as of Apr 07 1700  Mon Apr 07, 2016  1614 CXR with out consolidation.  Nebs currently infusing.  There is improvement in breath sounds but patient still SOB.  Less consistent with PE or CHF based on exam and improvement with nebs.  Will continue to monitor and reassess. [PR]  [id number] Patient with improved air movement but still marked wheezing.  Speaking in short phrases.  There is persistent use of accessory muscles.  Will reorder nebs.  Patient ambulated and desaturated to 85% ORA.  Will need admission for further evaluation and management.  Have discussed with the patient and available family all diagnostics and treatments performed thus far and all questions were answered to the best of my ability. The patient demonstrates understanding and agreement with plan.   [PR]    Clinical Course User Index [PR] Merlyn Lot, MD     ____________________________________________   FINAL CLINICAL IMPRESSION(S) / ED DIAGNOSES  Final diagnoses:  Acute respiratory failure with hypoxia ([place])  Acute bronchitis, unspecified organism  Hypomagnesemia      NEW MEDICATIONS STARTED DURING THIS VISIT:  New Prescriptions   No medications on file     Note:  This document was prepared using Dragon voice recognition software and may include unintentional dictation errors.    Merlyn Lot, MD 04/07/16 [phone]

## 2016-04-07 NOTE — H&P (Signed)
[place] [facility] [place] NAME: Sarah Dougherty    MR#:  [id number]  DATE OF BIRTH:  Aug 31, 1921  DATE OF ADMISSION:  04/07/2016  PRIMARY CARE PHYSICIAN: Perrin Maltese, MD   REQUESTING/REFERRING PHYSICIAN: Hardin Negus  CHIEF COMPLAINT:   Chief Complaint  Patient presents with  . Cough    HISTORY OF PRESENT ILLNESS: Sarah Dougherty  is a 82 y.o. female with a known history of COPD, CAD< Htn, DM- was in PMD's office with cough 4 days ago- given levaquin oral, continued o have Worsening cough and wheezing. SOB, chest pain b/l due to cough. Came back to ER- Xray chest clear.  PAST MEDICAL HISTORY:   Past Medical History:  Diagnosis Date  . COPD (chronic obstructive pulmonary disease) ([place])   . Coronary artery disease   . Diabetes mellitus without complication ([place])   . Hypertension   . On home oxygen therapy    uses at night    PAST SURGICAL HISTORY: Past Surgical History:  Procedure Laterality Date  . PACEMAKER INSERTION Left 12/12/2014   Procedure: INSERTION PACEMAKER;  Surgeon: Isaias Cowman, MD;  Location: [facility] ORS;  Service: Cardiovascular;  Laterality: Left;    SOCIAL HISTORY:  Social History  Substance Use Topics  . Smoking status: Never Smoker  . Smokeless tobacco: Never Used  . Alcohol use No    FAMILY HISTORY:  Family History  Problem Relation Age of Onset  . Diabetes Brother   . Diabetes Son     DRUG ALLERGIES:  Allergies  Allergen Reactions  . Nitrofurantoin     Other reaction(s): Unknown  . Penicillins     Other reaction(s): Unknown  . Sulfa Antibiotics     Other reaction(s): Unknown  . Azithromycin Rash    REVIEW OF SYSTEMS:   CONSTITUTIONAL: No fever, fatigue or weakness.  EYES: No blurred or double vision.  EARS, NOSE, AND THROAT: No tinnitus or ear pain.  RESPIRATORY: positive for cough, shortness of breath, wheezing , no hemoptysis.  CARDIOVASCULAR: No chest pain, orthopnea, edema.   GASTROINTESTINAL: No nausea, vomiting, diarrhea or abdominal pain.  GENITOURINARY: No dysuria, hematuria.  ENDOCRINE: No polyuria, nocturia,  HEMATOLOGY: No anemia, easy bruising or bleeding SKIN: No rash or lesion. MUSCULOSKELETAL: No joint pain or arthritis.   NEUROLOGIC: No tingling, numbness, weakness.  PSYCHIATRY: No anxiety or depression.   MEDICATIONS AT HOME:  Prior to Admission medications   Medication Sig Start Date End Date Taking? Authorizing Provider  acetaminophen (TYLENOL 8 HOUR) 650 MG CR tablet Take 650 mg by mouth every 8 (eight) hours as needed for pain.   Yes Historical Provider, MD  amLODipine (NORVASC) 5 MG tablet Take 5 mg by mouth daily. 11/13/14  Yes Historical Provider, MD  ciprofloxacin (CIPRO) 250 MG tablet Take 1 tablet (250 mg total) by mouth 2 (two) times daily. 12/13/14  Yes Aldean Jewett, MD  clotrimazole (GYNE-LOTRIMIN) 1 % vaginal cream Place 1 application vaginally daily as needed. For yeast infection. 11/15/14  Yes Historical Provider, MD  glimepiride (AMARYL) 2 MG tablet Take 2 mg by mouth daily. 10/18/14  Yes Historical Provider, MD  hydrocortisone cream (NEOSPORIN ECZEMA ESSENT MAX ST) 1 % Apply 1 application topically 2 (two) times daily as needed for itching (for sores.).   Yes Historical Provider, MD  Rivaroxaban (XARELTO PO) Take 1 tablet by mouth daily.   Yes Historical Provider, MD  sodium chloride (OCEAN) 0.65 % nasal spray Place 2 sprays into the nose daily as  needed. 09/14/14  Yes Historical Provider, MD      PHYSICAL EXAMINATION:   VITAL SIGNS: Blood pressure (!) 118/46, pulse 69, temperature 97.7 F (36.5 C), temperature source Oral, resp. rate (!) 26, height 5' (1.524 m), weight 59 kg (130 lb), SpO2 90 %.  GENERAL:  82 y.o.-year-old patient lying in the bed with no acute distress.  EYES: Pupils equal, round, reactive to light and accommodation. No scleral icterus. Extraocular muscles intact.  HEENT: Head atraumatic, normocephalic.  Oropharynx and nasopharynx clear.  NECK:  Supple, no jugular venous distention. No thyroid enlargement, no tenderness.  LUNGS: Normal breath sounds bilaterally, b/l wheezing, no crepitation. Positive for use of accessory muscles of respiration.  CARDIOVASCULAR: S1, S2 normal. No murmurs, rubs, or gallops.  ABDOMEN: Soft, nontender, nondistended. Bowel sounds present. No organomegaly or mass.  EXTREMITIES: No pedal edema, cyanosis, or clubbing.  NEUROLOGIC: Cranial nerves II through XII are intact. Muscle strength 5/5 in all extremities. Sensation intact. Gait not checked.  PSYCHIATRIC: The patient is alert and oriented x 3.  SKIN: No obvious rash, lesion, or ulcer.   LABORATORY PANEL:   CBC  Recent Labs Lab 04/07/16 1539  WBC 5.0  HGB 12.7  HCT 36.6  PLT 163  MCV 92.6  MCH 32.0  MCHC 34.6  RDW 13.9  LYMPHSABS 1.0  MONOABS 0.9  EOSABS 0.0  BASOSABS 0.0   ------------------------------------------------------------------------------------------------------------------  Chemistries   Recent Labs Lab 04/07/16 1539  NA 135  K 3.3*  CL 102  CO2 22  GLUCOSE 121*  BUN 25*  CREATININE 1.22*  CALCIUM 8.6*  MG 1.6*  AST 35  ALT 18  ALKPHOS 62  BILITOT 2.4*   ------------------------------------------------------------------------------------------------------------------ estimated creatinine clearance is 23.6 mL/min (by C-G formula based on SCr of 1.22 mg/dL (H)). ------------------------------------------------------------------------------------------------------------------ No results for input(s): TSH, T4TOTAL, T3FREE, THYROIDAB in the last 72 hours.  Invalid input(s): FREET3   Coagulation profile No results for input(s): INR, PROTIME in the last 168 hours. ------------------------------------------------------------------------------------------------------------------- No results for input(s): DDIMER in the last 72  hours. -------------------------------------------------------------------------------------------------------------------  Cardiac Enzymes  Recent Labs Lab 04/07/16 1539  TROPONINI <0.03   ------------------------------------------------------------------------------------------------------------------ Invalid input(s): POCBNP  ---------------------------------------------------------------------------------------------------------------  Urinalysis    Component Value Date/Time   COLORURINE YELLOW (A) 12/11/2014 1820   APPEARANCEUR CLOUDY (A) 12/11/2014 1820   APPEARANCEUR Clear 10/22/2011 1612   LABSPEC 1.016 12/11/2014 1820   LABSPEC 1.019 10/22/2011 1612   PHURINE 5.0 12/11/2014 1820   GLUCOSEU NEGATIVE 12/11/2014 1820   GLUCOSEU Negative 10/22/2011 1612   HGBUR 1+ (A) 12/11/2014 1820   BILIRUBINUR NEGATIVE 12/11/2014 1820   BILIRUBINUR Negative 10/22/2011 1612   KETONESUR NEGATIVE 12/11/2014 1820   PROTEINUR 30 (A) 12/11/2014 1820   NITRITE NEGATIVE 12/11/2014 1820   LEUKOCYTESUR 3+ (A) 12/11/2014 1820   LEUKOCYTESUR 1+ 10/22/2011 1612     RADIOLOGY: Dg Chest 2 View  Result Date: 04/07/2016 CLINICAL DATA:  Cough x4 days EXAM: CHEST  2 VIEW COMPARISON:  12/12/2014 FINDINGS: Mild bibasilar atelectasis. No focal consolidation. No pleural effusion or pneumothorax. Cardiomegaly. Left subclavian pacemaker. IMPRESSION: No evidence of acute cardiopulmonary disease. Electronically Signed   By: Julian Hy M.D.   On: 04/07/2016 15:51    EKG: Orders placed or performed during the hospital encounter of 04/07/16  . ED EKG  . ED EKG  . EKG 12-Lead  . EKG 12-Lead    IMPRESSION AND PLAN:  * Ac COPD exacerbation   IV steroid, nebs, IV rocephin, Spiriva.   * Hx of CAD  COn home meds  * a fib and s/p pace maker   Cont xarelto  * Hypertension   Cont amlodipine.  All the records are reviewed and case discussed with ED provider. Management plans discussed with  the patient, family and they are in agreement.  CODE STATUS: full code. Code Status History    Date Active Date Inactive Code Status Order ID Comments User Context   12/12/2014  2:52 PM 12/13/2014  6:28 PM Full Code [id number]  Isaias Cowman, MD Inpatient   12/09/2014 11:03 PM 12/12/2014  2:52 PM Full Code [id number]  Baxter Hire, MD Inpatient    Advance Directive Documentation   Flowsheet Row Most Recent Value  Type of Advance Directive  Living will  Pre-existing out of facility DNR order (yellow form or pink MOST form)  No data  "MOST" Form in Place?  No data     Discussed with her 2 sons in ER.  TOTAL TIME TAKING CARE OF THIS PATIENT: 45 minutes.    Vaughan Basta M.D on 04/07/2016   Between 7am to 6pm - Pager - [phone]  After 6pm go to [email or web address] - password [email or web address] [facility]  Office  [phone]  CC: Primary care physician; Perrin Maltese, MD   Note: This dictation was prepared with Dragon dictation along with smaller phrase technology. Any transcriptional errors that result from this process are unintentional.

## 2016-04-07 NOTE — ED Notes (Signed)
Pt oxygen consistently hanging out at 89% with perfect pleth on monitor. Placed back on 2 L nasal cannula. Will continue to monitor.

## 2016-04-07 NOTE — ED Notes (Signed)
Patient transported to X-ray 

## 2016-04-07 NOTE — ED Notes (Signed)
Admitting MD at bedside.

## 2016-04-07 NOTE — ED Notes (Signed)
Wrapped warm blanket around pt shoulders and back. Family states they do that at home.

## 2016-04-07 NOTE — ED Notes (Signed)
Pt O2 sats decreased to 85% while ambulating, MD made aware.

## 2016-04-07 NOTE — ED Triage Notes (Signed)
Pt presents with cough x 4 days. She saw PCP and was given prescription for levaquin, and then yesterday they called in a cough medicine. Pt states she is still coughing and that she has abdominal and back pain as well since yesterday. Denies vomiting, affirms nausea, diarrhea (but she has had it for months). NAD noted.

## 2016-04-07 NOTE — ED Notes (Signed)
Pt lying on L side, blankets covering her. Family at bedside.

## 2016-04-08 LAB — URINALYSIS, COMPLETE (UACMP) WITH MICROSCOPIC
Bacteria, UA: NONE SEEN
Bilirubin Urine: NEGATIVE
KETONES UR: NEGATIVE mg/dL
Leukocytes, UA: NEGATIVE
NITRITE: NEGATIVE
Protein, ur: NEGATIVE mg/dL
Specific Gravity, Urine: 1.014 (ref 1.005–1.030)
pH: 5 (ref 5.0–8.0)

## 2016-04-08 LAB — BASIC METABOLIC PANEL
Anion gap: 12 (ref 5–15)
BUN: 34 mg/dL — ABNORMAL HIGH (ref 6–20)
CHLORIDE: 103 mmol/L (ref 101–111)
CO2: 21 mmol/L — AB (ref 22–32)
CREATININE: 1.73 mg/dL — AB (ref 0.44–1.00)
Calcium: 8.4 mg/dL — ABNORMAL LOW (ref 8.9–10.3)
GFR calc non Af Amer: 24 mL/min — ABNORMAL LOW (ref >60)
GFR, EST AFRICAN AMERICAN: 28 mL/min — AB (ref >60)
GLUCOSE: 348 mg/dL — AB (ref 65–99)
Potassium: 3.2 mmol/L — ABNORMAL LOW (ref 3.5–5.1)
Sodium: 136 mmol/L (ref 135–145)

## 2016-04-08 LAB — CBC
HCT: 35.3 % (ref 35.0–47.0)
Hemoglobin: 12.1 g/dL (ref 12.0–16.0)
MCH: 32.2 pg (ref 26.0–34.0)
MCHC: 34.2 g/dL (ref 32.0–36.0)
MCV: 94 fL (ref 80.0–100.0)
PLATELETS: 156 10*3/uL (ref 150–440)
RBC: 3.76 MIL/uL — AB (ref 3.80–5.20)
RDW: 13.8 % (ref 11.5–14.5)
WBC: 4.5 10*3/uL (ref 3.6–11.0)

## 2016-04-08 LAB — GLUCOSE, CAPILLARY
GLUCOSE-CAPILLARY: 302 mg/dL — AB (ref 65–99)
Glucose-Capillary: 324 mg/dL — ABNORMAL HIGH (ref 65–99)
Glucose-Capillary: 250 mg/dL — ABNORMAL HIGH (ref 65–99)

## 2016-04-08 LAB — PROCALCITONIN: Procalcitonin: 0.15 ng/mL

## 2016-04-08 MED ORDER — INSULIN ASPART 100 UNIT/ML ~~LOC~~ SOLN
0.0000 [IU] | Freq: Three times a day (TID) | SUBCUTANEOUS | Status: DC
  Administered 2016-04-08: 3 [IU] via SUBCUTANEOUS
  Administered 2016-04-08: 7 [IU] via SUBCUTANEOUS
  Administered 2016-04-09 (×2): 3 [IU] via SUBCUTANEOUS
  Administered 2016-04-09: 08:00:00 5 [IU] via SUBCUTANEOUS
  Administered 2016-04-10 (×2): 3 [IU] via SUBCUTANEOUS
  Filled 2016-04-08: qty 7
  Filled 2016-04-08 (×3): qty 3
  Filled 2016-04-08: qty 5
  Filled 2016-04-08 (×2): qty 3

## 2016-04-08 MED ORDER — POTASSIUM CHLORIDE CRYS ER 20 MEQ PO TBCR
40.0000 meq | EXTENDED_RELEASE_TABLET | Freq: Once | ORAL | Status: AC
  Administered 2016-04-08: 40 meq via ORAL
  Filled 2016-04-08: qty 2

## 2016-04-08 MED ORDER — DOXYCYCLINE HYCLATE 100 MG PO TABS
100.0000 mg | ORAL_TABLET | Freq: Two times a day (BID) | ORAL | Status: DC
Start: 2016-04-08 — End: 2016-04-10
  Administered 2016-04-08 – 2016-04-10 (×5): 100 mg via ORAL
  Filled 2016-04-08 (×5): qty 1

## 2016-04-08 MED ORDER — METHYLPREDNISOLONE SODIUM SUCC 40 MG IJ SOLR: 40.0000 mg | Freq: Four times a day (QID) | INTRAMUSCULAR | Status: DC

## 2016-04-08 MED ORDER — SODIUM CHLORIDE 0.9 % IV SOLN
INTRAVENOUS | Status: DC
  Administered 2016-04-08 (×2): via INTRAVENOUS

## 2016-04-08 MED ORDER — METHYLPREDNISOLONE SODIUM SUCC 40 MG IJ SOLR
40.0000 mg | Freq: Three times a day (TID) | INTRAMUSCULAR | Status: DC
  Administered 2016-04-08 – 2016-04-09 (×3): 40 mg via INTRAVENOUS
  Filled 2016-04-08 (×3): qty 1

## 2016-04-08 MED ORDER — INSULIN ASPART 100 UNIT/ML ~~LOC~~ SOLN
0.0000 [IU] | Freq: Every day | SUBCUTANEOUS | Status: DC
  Administered 2016-04-08: 22:00:00 4 [IU] via SUBCUTANEOUS
  Filled 2016-04-08: qty 4

## 2016-04-08 NOTE — Care Management Important Message (Signed)
Important Message  Patient Details  Name: Sarah Dougherty MRN: [id number] Date of Birth: 08-12-1921   Medicare Important Message Given:  Yes    Shelbie Ammons, RN 04/08/2016, 9:19 AM

## 2016-04-08 NOTE — Evaluation (Signed)
Physical Therapy Evaluation Patient Details Name: Sarah Dougherty MRN: [id number] DOB: 1922/02/26 Today's Date: 04/08/2016   History of Present Illness  Patient here with COPD exacerbation. She reports that her shortness of breath has improved since admission. Pt reports frequent falls over the last 12 months. States that falls have improved since pacemaker was placed  Clinical Impression  Pt admitted with above diagnosis. Pt currently with functional limitations due to the deficits listed below (see PT Problem List).  Pt is unsteady on her feet and present with general deconditioning and weakness. Poor safety awareness and poor vision. Pt is tremulous during ambulation and therapist provides assist multiple times throughout gait to prevent falls. Pt will need SNF placement at discharge to facilitate safe return to prior level of function at home. Pt will benefit from skilled PT services to address deficits in strength, balance, and mobility in order to return to full function at home.     Follow Up Recommendations SNF    Equipment Recommendations  None recommended by PT    Recommendations for Other Services       Precautions / Restrictions Precautions Precautions: Fall Restrictions Weight Bearing Restrictions: No      Mobility  Bed Mobility Overal bed mobility: Needs Assistance Bed Mobility: Supine to Sit;Sit to Supine     Supine to sit: Min assist Sit to supine: Min assist   General bed mobility comments: Pt moves slowly. HOB elevated and bed rails utilized  Transfers Overall transfer level: Needs assistance Equipment used: Rolling walker (2 wheeled) Transfers: Sit to/from Stand Sit to Stand: Min guard         General transfer comment: Pt is unstable in standing requiring UE support to stabilize. Fair LE strength noted with sit to stand transfers  Ambulation/Gait Ambulation/Gait assistance: Min assist Ambulation Distance (Feet): 60 Feet Assistive device: Rolling  walker (2 wheeled) Gait Pattern/deviations: Decreased step length - right;Decreased step length - left Gait velocity: Decreased Gait velocity interpretation: <1.8 ft/sec, indicative of risk for recurrent falls General Gait Details: Pt is shaky during ambulation requiring UE assist and assist from therapist to stabilize. Supplemental O2 removed and SaO2 drops to 89% on room air with bed mobility so O2 donned for additional activity. pt demonstrates short shuffling steps and poor safety awareness. SaO2 remains at or above 90% on 2L/min o2 during ambulation. DOE noted with increased RR and difficulty talking with ambulation  Stairs            Wheelchair Mobility    Modified Rankin (Stroke Patients Only)       Balance Overall balance assessment: Needs assistance Sitting-balance support: No upper extremity supported Sitting balance-Leahy Scale: Good     Standing balance support: No upper extremity supported Standing balance-Leahy Scale: Fair Standing balance comment: Able to maintain wide stance. Falls when attempting to place feet together without UE support                             Pertinent Vitals/Pain Pain Assessment: 0-10 Pain Location: Pt reports rib pain bilaterally under breasts. Unable to rate on NPRS. Reports pain both at rest but also with coughing Pain Intervention(s): Monitored during session    [place] expects to be discharged to:: Private residence Living Arrangements: Children Available Help at Discharge: Family Type of Home: House Home Access: Stairs to enter   Technical brewer of Steps: 2 (Rail in the middle of steps) Home Layout: One level Home  Equipment: Gilford Rile - 4 wheels;Shower seat;Cane - single point      Prior Function Level of Independence: Needs assistance   Gait / Transfers Assistance Needed: Pt is supervision with rollator athome.   ADL's / Homemaking Assistance Needed: Pt reports independence with  ADLs. Assist from family with IADLs. States that she is able to to bathe herself in the shower  Comments: Pt reports 24/7 assist from son     Hand Dominance   Dominant Hand: Right    Extremity/Trunk Assessment   Upper Extremity Assessment Upper Extremity Assessment: RUE deficits/detail RUE Deficits / Details: Bilateral shoulder flexion deficits chronic prior to admission. Generalized UE weakness noted but unsure how close to baseline for patient    Lower Extremity Assessment Lower Extremity Assessment: Generalized weakness       Communication   Communication: HOH  Cognition Arousal/Alertness: Awake/alert Behavior During Therapy: WFL for tasks assessed/performed Overall Cognitive Status: Within Functional Limits for tasks assessed                      General Comments      Exercises     Assessment/Plan    PT Assessment Patient needs continued PT services  PT Problem List Decreased strength;Decreased activity tolerance;Decreased balance;Decreased mobility;Decreased safety awareness          PT Treatment Interventions DME instruction;Gait training;Therapeutic activities;Therapeutic exercise;Stair training;Balance training;Neuromuscular re-education;Patient/family education    PT Goals (Current goals can be found in the Care Plan section)  Acute Rehab PT Goals Patient Stated Goal: Return to prior level of function at home PT Goal Formulation: With patient/family Time For Goal Achievement: 04/22/16 Potential to Achieve Goals: Good    Frequency Min 2X/week   Barriers to discharge        Co-evaluation               End of Session Equipment Utilized During Treatment: Gait belt;Oxygen Activity Tolerance: Patient tolerated treatment well Patient left: in bed;with call bell/phone within reach;with bed alarm set;with family/visitor present           Time: [id number] PT Time Calculation (min) (ACUTE ONLY): 21 min   Charges:   PT Evaluation $PT  Eval Low Complexity: 1 Procedure     PT G Codes:       Lyndel Safe Harshith Pursell PT, DPT   Seanpatrick Maisano 04/08/2016, 5:09 PM

## 2016-04-08 NOTE — Progress Notes (Addendum)
Inpatient Diabetes Program Recommendations  AACE/ADA: New Consensus Statement on Inpatient Glycemic Control (2015)  Target Ranges:  Prepandial:   less than 140 mg/dL      Peak postprandial:   less than 180 mg/dL (1-2 hours)      Critically ill patients:  140 - 180 mg/dL   Lab Results  Component Value Date   GLUCAP 134 (H) 12/13/2014   Results for DIAVIAN, VANDERJAGT (MRN [id number]) as of 04/08/2016 07:33  Ref. Range 04/07/2016 15:39 04/08/2016 05:19  Glucose Latest Ref Range: 65 - 99 mg/dL 121 (H) 348 (H)   Last HGBA1C found in chart was in 2013 (6.6%)  Review of Glycemic Control  Diabetes history:     DM2, steroids this admission, BUN=34, Creatinine=1.73  Outpatient Diabetes medications:     Amaryl 2 mg daily  Current orders for Inpatient glycemic control:     Amaryl 2 mg daily  Inpatient Diabetes Program Recommendations:    While on steroids, please consider sensitive correction scale (Novolog 0-9 units TIDAC).     Per ADA recommendations "consider performing an A1C on all patients with diabetes or hyperglycemia admitted to the hospital if not performed in the prior 3 months".  Thank you,  Windy Carina, RN, MSN Diabetes Coordinator Inpatient Diabetes Program [phone] (Team Pager)

## 2016-04-08 NOTE — Progress Notes (Addendum)
[facility] [place] NAME: Sarah Dougherty    MR#:  [id number]  DATE OF BIRTH:  Feb 26, 1922  SUBJECTIVE:   Patient here with COPD exacerbation. She reports that her shortness of breath has improved since admission.  REVIEW OF SYSTEMS:    Review of Systems  Constitutional: Negative.  Negative for chills, fever and malaise/fatigue.  HENT: Positive for hearing loss. Negative for ear discharge, ear pain, nosebleeds and sore throat.   Eyes: Negative.  Negative for blurred vision and pain.  Respiratory: Positive for shortness of breath (improved) and wheezing. Negative for cough and hemoptysis.   Cardiovascular: Negative.  Negative for chest pain, palpitations and leg swelling.  Gastrointestinal: Negative.  Negative for abdominal pain, blood in stool, diarrhea, nausea and vomiting.  Genitourinary: Negative.  Negative for dysuria.  Musculoskeletal: Negative.  Negative for back pain.  Skin: Negative.   Neurological: Negative for dizziness, tremors, speech change, focal weakness, seizures and headaches.  Endo/Heme/Allergies: Negative.  Does not bruise/bleed easily.  Psychiatric/Behavioral: Positive for memory loss. Negative for depression, hallucinations and suicidal ideas.    Tolerating Diet: yes      DRUG ALLERGIES:   Allergies  Allergen Reactions  . Nitrofurantoin     Other reaction(s): Unknown  . Penicillins     Other reaction(s): Unknown  . Sulfa Antibiotics     Other reaction(s): Unknown  . Azithromycin Rash    VITALS:  Blood pressure (!) 138/54, pulse 65, temperature 97.9 F (36.6 C), temperature source Oral, resp. rate 20, height 5\' 2"  (1.575 m), weight 56.9 kg (125 lb 6 oz), SpO2 96 %.  PHYSICAL EXAMINATION:   Physical Exam    LABORATORY PANEL:   CBC  Recent Labs Lab 04/08/16 0519  WBC 4.5  HGB 12.1  HCT 35.3  PLT 156    ------------------------------------------------------------------------------------------------------------------  Chemistries   Recent Labs Lab 04/07/16 1539 04/08/16 0519  NA 135 136  K 3.3* 3.2*  CL 102 103  CO2 22 21*  GLUCOSE 121* 348*  BUN 25* 34*  CREATININE 1.22* 1.73*  CALCIUM 8.6* 8.4*  MG 1.6*  --   AST 35  --   ALT 18  --   ALKPHOS 62  --   BILITOT 2.4*  --    ------------------------------------------------------------------------------------------------------------------  Cardiac Enzymes  Recent Labs Lab 04/07/16 1539  TROPONINI <0.03   ------------------------------------------------------------------------------------------------------------------  RADIOLOGY:  Dg Chest 2 View  Result Date: 04/07/2016 CLINICAL DATA:  Cough x4 days EXAM: CHEST  2 VIEW COMPARISON:  12/12/2014 FINDINGS: Mild bibasilar atelectasis. No focal consolidation. No pleural effusion or pneumothorax. Cardiomegaly. Left subclavian pacemaker. IMPRESSION: No evidence of acute cardiopulmonary disease. Electronically Signed   By: Julian Hy M.D.   On: 04/07/2016 15:51     ASSESSMENT AND PLAN:   82 year old female with a history of COPD on nocturnal oxygen who presents with acute on chronic hypoxic respiratory failure.  1. Acute on chronic hypoxic respiratory failure in the setting of acute COPD exacerbation  Oxygen to nighttime use if tolerated  2. Acute COPD exacerbation with acute bronchitis:  Wean IV steroids to 40 mg every 8 hours Continue nebulizer treatment and Spiriva Continue DOXY for acute bronchitis   3. Hypertension on Norvasc 4. Diabetes, not insulin-dependent: Continue Amaryl Start sliding scale insulin ADA diet   5. Hypokalemia: Replete and recheck in a.m.  6. Acute kidney injury: Gentle IV fluids and repeat in a.m. 7. History of atrial fibrillation: Continue XARELTO 8.Pacemaker status for symptomatic bradycardia HR  controlled  PT consult for  disposition  Management plans discussed with the patient and she is in agreement.  CODE STATUS: Full  TOTAL TIME TAKING CARE OF THIS PATIENT: 30 minutes.     POSSIBLE D/C 1-2 days, DEPENDING ON CLINICAL CONDITION.   Vyla Pint M.D on 04/08/2016 at 8:34 AM  Between 7am to 6pm - Pager - [phone] After 6pm go to [email or web address] - password [email or web address] [facility]  Office  [phone]  CC: Primary care physician; Perrin Maltese, MD  Note: This dictation was prepared with Dragon dictation along with smaller phrase technology. Any transcriptional errors that result from this process are unintentional.

## 2016-04-09 LAB — BASIC METABOLIC PANEL
Anion gap: 8 (ref 5–15)
BUN: 35 mg/dL — AB (ref 6–20)
CHLORIDE: 110 mmol/L (ref 101–111)
CO2: 22 mmol/L (ref 22–32)
CREATININE: 1.3 mg/dL — AB (ref 0.44–1.00)
Calcium: 8.4 mg/dL — ABNORMAL LOW (ref 8.9–10.3)
GFR calc Af Amer: 40 mL/min — ABNORMAL LOW (ref >60)
GFR calc non Af Amer: 35 mL/min — ABNORMAL LOW (ref >60)
GLUCOSE: 298 mg/dL — AB (ref 65–99)
Potassium: 4.2 mmol/L (ref 3.5–5.1)
SODIUM: 140 mmol/L (ref 135–145)

## 2016-04-09 LAB — GLUCOSE, CAPILLARY
Glucose-Capillary: 256 mg/dL — ABNORMAL HIGH (ref 65–99)
Glucose-Capillary: 242 mg/dL — ABNORMAL HIGH (ref 65–99)
Glucose-Capillary: 223 mg/dL — ABNORMAL HIGH (ref 65–99)
Glucose-Capillary: 95 mg/dL (ref 65–99)

## 2016-04-09 MED ORDER — GUAIFENESIN 100 MG/5ML PO SOLN
10.0000 mL | ORAL | Status: DC | PRN
  Administered 2016-04-09 – 2016-04-10 (×6): 200 mg via ORAL
  Filled 2016-04-09 (×6): qty 10

## 2016-04-09 MED ORDER — INSULIN ASPART 100 UNIT/ML ~~LOC~~ SOLN
3.0000 [IU] | Freq: Three times a day (TID) | SUBCUTANEOUS | Status: DC
  Administered 2016-04-09 – 2016-04-10 (×3): 3 [IU] via SUBCUTANEOUS
  Filled 2016-04-09 (×3): qty 3

## 2016-04-09 MED ORDER — METHYLPREDNISOLONE SODIUM SUCC 40 MG IJ SOLR
40.0000 mg | INTRAMUSCULAR | Status: DC
  Administered 2016-04-10: 40 mg via INTRAVENOUS
  Filled 2016-04-09: qty 1

## 2016-04-09 NOTE — Progress Notes (Signed)
Physical Therapy Treatment Patient Details Name: Sarah Dougherty MRN: [id number] DOB: 1921/12/13 Today's Date: 04/09/2016    History of Present Illness Patient here with COPD exacerbation. She reports that her shortness of breath has improved since admission. Pt reports frequent falls over the last 12 months. States that falls have improved since pacemaker was placed.    PT Comments    Pt continues to present with deficits in strength, gait, mobility, transfers, balance, and activity tolerance but is slowly progressing towards goals.  Pt able to amb 1 x 80' and 2 x 15' with session with CGA and RW but continues to present with deficits in balance.  Pt required min A with bed mobility during sup to sit and CGA with transfers.  Pt on 2.5LO2/min in room and 3LO2/min during amb with portable tank.  SpO2 decreased from a baseline of 97% to 93% after amb with mod SOB that resolved quickly upon sitting.  HR increased from 80 bpm to 92 bpm after amb.  Pt will benefit from PT services to address above deficits for decreased caregiver assistance upon discharge.   Follow Up Recommendations  SNF     Equipment Recommendations  None recommended by PT    Recommendations for Other Services       Precautions / Restrictions Precautions Precautions: Fall Restrictions Weight Bearing Restrictions: No    Mobility  Bed Mobility Overal bed mobility: Needs Assistance Bed Mobility: Supine to Sit;Sit to Supine     Supine to sit: Min assist Sit to supine: Supervision   General bed mobility comments: Pt required extra time/effort during bed mobility training with HOB elevated only slightly  Transfers Overall transfer level: Needs assistance Equipment used: Rolling walker (2 wheeled) Transfers: Sit to/from Stand Sit to Stand: Min guard            Ambulation/Gait Ambulation/Gait assistance: Min guard Ambulation Distance (Feet): 80 Feet, 2 x 15 feet Assistive device: Rolling walker (2  wheeled) Gait Pattern/deviations: Decreased step length - right;Decreased step length - left;Trunk flexed   Gait velocity interpretation: <1.8 ft/sec, indicative of risk for recurrent falls General Gait Details: Pt presented min instability grossly during gait but no assistance required to prevent LOB.   Stairs            Wheelchair Mobility    Modified Rankin (Stroke Patients Only)       Balance Overall balance assessment: Needs assistance Sitting-balance support: No upper extremity supported Sitting balance-Leahy Scale: Good     Standing balance support: Bilateral upper extremity supported Standing balance-Leahy Scale: Fair                      Cognition Arousal/Alertness: Awake/alert Behavior During Therapy: WFL for tasks assessed/performed Overall Cognitive Status: Within Functional Limits for tasks assessed                      Exercises Total Joint Exercises Ankle Circles/Pumps: Strengthening;Both;10 reps Quad Sets: AROM;Both;10 reps Gluteal Sets: AROM;Both;10 reps Short Arc Quad: Strengthening;Both;10 reps Heel Slides: AROM;Both;10 reps Hip ABduction/ADduction: AROM;Both;10 reps Straight Leg Raises: AROM;Both;10 reps Long Arc Quad: Strengthening;Both;10 reps Knee Flexion: Strengthening;Both;10 reps    General Comments        Pertinent Vitals/Pain Pain Assessment: No/denies pain    Home Living                      Prior Function  PT Goals (current goals can now be found in the care plan section) Progress towards PT goals: Progressing toward goals    Frequency           PT Plan Current plan remains appropriate    Co-evaluation             End of Session Equipment Utilized During Treatment: Gait belt;Oxygen Activity Tolerance: Patient tolerated treatment well Patient left: Other (comment) (Pt taken to bathroom for toileting at end of session and left with CNA )     Time: 1447-1520 PT Time  Calculation (min) (ACUTE ONLY): 33 min  Charges:  $Gait Training: 8-22 mins $Therapeutic Exercise: 8-22 mins                    G Codes:      DRoyetta Asal PT, DPT 04/09/16, 4:28 PM

## 2016-04-09 NOTE — Clinical Social Work Note (Signed)
Clinical Social Work Assessment  Patient Details  Name: Sarah Dougherty MRN: [id number] Date of Birth: [birth year]  Date of referral:  04/09/16               Reason for consult:  Facility Placement                Permission sought to share information with:  Chartered certified accountant granted to share information::  Yes, Verbal Permission Granted  Name::      [place]::   [place]   Relationship::     Contact Information:     Housing/Transportation Living arrangements for the past 2 months:  [place] of Information:  Patient, Adult Children Patient Interpreter Needed:  None Criminal Activity/Legal Involvement Pertinent to Current Situation/Hospitalization:  No - Comment as needed Significant Relationships:  Adult Children Lives with:  Adult Children Do you feel safe going back to the place where you live?  Yes Need for family participation in patient care:  Yes (Comment)  Care giving concerns:  Patient lives in [place] with her son [name].    Social Worker assessment / plan:  Holiday representative (CSW) received SNF consult. PT is recommending SNF. CSW met with patient and her son [name] [phone] was at bedside. CSW introduced self and explained role of CSW department. Patient reported that she lives in [place] with her son [name]. Per [name] he just retired and is helping out with patient more. CSW explained that PT is recommending SNF. CSW also explained that patient's insurance Health Team will require authorization. Patient verbalized her understanding and is agreeable to SNF search in [place]. Patient's son prefers [employer] or [facility]. FL2 complete and faxed out. [facility] Team case manager is aware of above. CSW will continue to follow and assist as needed.   Employment status:  Retired Nurse, adult PT Recommendations:  [place] /  Referral to community resources:  [place]  Patient/Family's Response to care:  Patient and son are agreeable to SNF search in [place].   Patient/Family's Understanding of and Emotional Response to Diagnosis, Current Treatment, and Prognosis:  Patient was very pleasant and thanked CSW for assistance.   Emotional Assessment Appearance:  Appears stated age Attitude/Demeanor/Rapport:    Affect (typically observed):  Accepting, Adaptable, Pleasant Orientation:  Oriented to Self, Oriented to Place, Oriented to  Time, Oriented to Situation Alcohol / Substance use:  Not Applicable Psych involvement (Current and /or in the community):  No (Comment)  Discharge Needs  Concerns to be addressed:  Discharge Planning Concerns Readmission within the last 30 days:  No Current discharge risk:  Dependent with Mobility Barriers to Discharge:  Continued Medical Work up   [employer], Veronia Beets, LCSW 04/09/2016, 3:07 PM

## 2016-04-09 NOTE — Progress Notes (Signed)
Clinical Education officer, museum (CSW) presented bed offers to patient and sons and they chose [facility]. [facility] admissions coordinator at [facility] is aware of accepted bed offer. CSW left [facility] Team case manager a voicemail making her aware of above. CSW will continue to follow and assist as needed.   [employer], LCSW [phone]

## 2016-04-09 NOTE — Clinical Social Work Placement (Signed)
   CLINICAL SOCIAL WORK PLACEMENT  NOTE  Date:  04/09/2016  Patient Details  Name: Sarah Dougherty MRN: [id number] Date of Birth: [birth year]  Clinical Social Work is seeking post-discharge placement for this patient at the [place] level of care (*CSW will initial, date and re-position this form in  chart as items are completed):  Yes   Patient/family provided with [place] Work Department's list of facilities offering this level of care within the geographic area requested by the patient (or if unable, by the patient's family).  Yes   Patient/family informed of their freedom to choose among providers that offer the needed level of care, that participate in Medicare, Medicaid or managed care program needed by the patient, have an available bed and are willing to accept the patient.  Yes   Patient/family informed of [facility]'s ownership interest in [facility] and [facility], as well as of the fact that they are under no obligation to receive care at these facilities.  PASRR submitted to EDS on 04/09/16     PASRR number received on 04/09/16     Existing PASRR number confirmed on       FL2 transmitted to all facilities in geographic area requested by pt/family on 04/09/16     FL2 transmitted to all facilities within larger geographic area on       Patient informed that his/her managed care company has contracts with or will negotiate with certain facilities, including the following:            Patient/family informed of bed offers received.  Patient chooses bed at       Physician recommends and patient chooses bed at      Patient to be transferred to   on  .  Patient to be transferred to facility by       Patient family notified on   of transfer.  Name of family member notified:        PHYSICIAN       Additional Comment:    _______________________________________________ Xiomar Crompton, Veronia Beets, LCSW 04/09/2016, 3:07 PM

## 2016-04-09 NOTE — Progress Notes (Signed)
Inpatient Diabetes Program Recommendations  AACE/ADA: New Consensus Statement on Inpatient Glycemic Control (2015)  Target Ranges:  Prepandial:   less than 140 mg/dL      Peak postprandial:   less than 180 mg/dL (1-2 hours)      Critically ill patients:  140 - 180 mg/dL   Lab Results  Component Value Date   GLUCAP 256 (H) 04/09/2016   Results for Sarah, Dougherty (MRN [id number]) as of 04/09/2016 08:10  Ref. Range 04/08/2016 12:05 04/08/2016 16:48 04/08/2016 19:58 04/09/2016 07:15  Glucose-Capillary Latest Ref Range: 65 - 99 mg/dL 324 (H)  Novolog 7 units 250 (H)  Novolog 3 units 302 (H)  256 (H)   Review of Glycemic Control  Diabetes history:     DM2, steroids this admission, BUN=34, Creatinine=1.73  Outpatient Diabetes medications:     Amaryl 2 mg daily  Current orders for Inpatient glycemic control:     Amaryl 2 mg daily    Novolog sensitive correction (0-9 units TIDAC and 0-5 units QHS)  Inpatient Diabetes Program Recommendations:      CBG's continue to be high after addition of sensitive correction.  Please consider Novolog 3 units TIDAC meal coverage if patient eats > 50% of meal (short acting insulin may be best based on patient's age and kidney function).  Thank you,  Windy Carina, RN, MSN Diabetes Coordinator Inpatient Diabetes Program [phone] (Team Pager)

## 2016-04-09 NOTE — Progress Notes (Signed)
While rounding, [place] made initial visit to room 124. Pt was in bed. Son was bedside. Son stated that Pt was getting a little better each day. Son is appreciative of the care given at [facility]. Son hopes that mother can transition to rehab. Pt did not indicate a need for spiritual care at this time. [facility] is available for follow up as needed.    04/09/16 1500  Clinical Encounter Type  Visited With Patient;Patient and family together  Visit Type Initial;Spiritual support  Referral From Nurse  Spiritual Encounters  Spiritual Needs Prayer

## 2016-04-09 NOTE — NC FL2 (Signed)
[place] LEVEL OF CARE SCREENING TOOL     IDENTIFICATION  Patient Name: Sarah Dougherty Birthdate: 1922/01/20 Sex: female Admission Date (Current Location): 04/07/2016  [place] and [place] Number:  Engineering geologist and Address:  [facility], [address] [place], [place] [address]      Provider Number: [id number]  Attending Physician Name and Address:  Lytle Butte, MD  Relative Name and Phone Number:       Current Level of Care: Hospital Recommended Level of Care: [place] Prior Approval Number:    Date Approved/Denied:   PASRR Number:  ([id number])  Discharge Plan: SNF    Current Diagnoses: Patient Active Problem List   Diagnosis Date Noted  . COPD exacerbation ([place]) 04/07/2016  . Squamous cell carcinoma 03/19/2016  . Bradycardia 12/10/2014  . COPD (chronic obstructive pulmonary disease) ([place]) 12/09/2014    Orientation RESPIRATION BLADDER Height & Weight     Self, Time, Situation, Place  O2 (2.5 Liters Oxygen. ) Incontinent Weight: 125 lb 6 oz (56.9 kg) Height:  5\' 2"  (157.5 cm)  BEHAVIORAL SYMPTOMS/MOOD NEUROLOGICAL BOWEL NUTRITION STATUS   (none)  (none) Incontinent Diet (Diet: Heart Healthy )  AMBULATORY STATUS COMMUNICATION OF NEEDS Skin   Extensive Assist Verbally Other (Comment) (Left Jaw bruise. )                       Personal Care Assistance Level of Assistance  Bathing, Feeding, Dressing Bathing Assistance: Limited assistance Feeding assistance: Independent Dressing Assistance: Limited assistance     Functional Limitations Info  Sight, Hearing, Speech Sight Info: Impaired Hearing Info: Impaired Speech Info: Adequate    SPECIAL CARE FACTORS FREQUENCY  PT (By licensed PT), OT (By licensed OT)     PT Frequency:  (5) OT Frequency:  (5)            Contractures      Additional Factors Info  Code Status, Allergies, Insulin Sliding Scale Code Status Info:  (Full  Code. ) Allergies Info:  (Nitrofurantoin, Penicillins, Sulfa Antibiotics, Azithromycin)   Insulin Sliding Scale Info:  (NovoLog Insulin Injections. )       Current Medications (04/09/2016):  This is the current hospital active medication list Current Facility-Administered Medications  Medication Dose Route Frequency Provider Last Rate Last Dose  . acetaminophen (TYLENOL) tablet 650 mg  650 mg Oral QID PRN Vaughan Basta, MD   650 mg at 04/09/16 0410  . amLODipine (NORVASC) tablet 5 mg  5 mg Oral Daily Vaughan Basta, MD   5 mg at 04/09/16 [id number]  . budesonide (PULMICORT) nebulizer solution 0.25 mg  0.25 mg Nebulization BID Vaughan Basta, MD   0.25 mg at 04/09/16 0738  . doxycycline (VIBRA-TABS) tablet 100 mg  100 mg Oral Q12H Lytle Butte, MD   100 mg at 04/09/16 [id number]  . glimepiride (AMARYL) tablet 2 mg  2 mg Oral Daily Vaughan Basta, MD   2 mg at 04/09/16 0815  . guaiFENesin (ROBITUSSIN) 100 MG/5ML solution 200 mg  10 mL Oral Q4H PRN Lytle Butte, MD   200 mg at 04/09/16 1256  . hydrocortisone cream 1 % 1 application  1 application Topical BID PRN Vaughan Basta, MD      . insulin aspart (novoLOG) injection 0-5 Units  0-5 Units Subcutaneous QHS Bettey Costa, MD   4 Units at 04/08/16 2222  . insulin aspart (novoLOG) injection 0-9 Units  0-9 Units Subcutaneous TID WC Sital  Benjie Karvonen, MD   3 Units at 04/09/16 1250  . insulin aspart (novoLOG) injection 3 Units  3 Units Subcutaneous TID WC Lytle Butte, MD      . ipratropium-albuterol (DUONEB) 0.5-2.5 (3) MG/3ML nebulizer solution 3 mL  3 mL Nebulization Q4H Vaughan Basta, MD   3 mL at 04/09/16 1217  . magnesium sulfate IVPB 2 g 50 mL  2 g Intravenous Once Merlyn Lot, MD   Stopped at 04/07/16 1651  . [START ON 04/10/2016] methylPREDNISolone sodium succinate (SOLU-MEDROL) 40 mg/mL injection 40 mg  40 mg Intravenous Q24H Lytle Butte, MD      . Rivaroxaban Alveda Reasons) tablet 15 mg  15 mg Oral Q supper  Vaughan Basta, MD   15 mg at 04/08/16 1737  . sodium chloride (OCEAN) 0.65 % nasal spray 2 spray  2 spray Nasal Daily PRN Vaughan Basta, MD      . tiotropium (SPIRIVA) inhalation capsule 18 mcg  18 mcg Inhalation Daily Vaughan Basta, MD   18 mcg at 04/09/16 [id number]     Discharge Medications: Please see discharge summary for a list of discharge medications.  Relevant Imaging Results:  Relevant Lab Results:   Additional Information  (SSN: [id number])  Jezabelle Chisolm, Veronia Beets, LCSW

## 2016-04-09 NOTE — Progress Notes (Signed)
[facility] at [place] NAME: Sarah Dougherty    MRN#:  [id number]  DATE OF BIRTH:  1921-05-26  SUBJECTIVE:  Hospital Day: 2 days Sarah Dougherty is a 82 y.o. female presenting with Cough .   Overnight events: no acute overnight events Interval Events: still complains of cough and shortness of breath but improving, on 2 L nasal cannula usually wears 2 L at bedtime only  REVIEW OF SYSTEMS:  CONSTITUTIONAL: No fever,positive fatigue or weakness.  EYES: No blurred or double vision.  EARS, NOSE, AND THROAT: No tinnitus or ear pain.  RESPIRATORY: positivecough, shortness of breath, enieswheezing or hemoptysis.  CARDIOVASCULAR: No chest pain, orthopnea, edema.  GASTROINTESTINAL: No nausea, vomiting, diarrhea or abdominal pain.  GENITOURINARY: No dysuria, hematuria.  ENDOCRINE: No polyuria, nocturia,  HEMATOLOGY: No anemia, easy bruising or bleeding SKIN: No rash or lesion. MUSCULOSKELETAL: No joint pain or arthritis.   NEUROLOGIC: No tingling, numbness, weakness.  PSYCHIATRY: No anxiety or depression.   DRUG ALLERGIES:   Allergies  Allergen Reactions  . Nitrofurantoin     Other reaction(s): Unknown  . Penicillins     Other reaction(s): Unknown  . Sulfa Antibiotics     Other reaction(s): Unknown  . Azithromycin Rash    VITALS:  Blood pressure (!) 134/50, pulse 74, temperature 97.8 F (36.6 C), temperature source Oral, resp. rate (!) 22, height 5\' 2"  (1.575 m), weight 56.9 kg (125 lb 6 oz), SpO2 98 %.  PHYSICAL EXAMINATION:  VITAL SIGNS: Vitals:   04/09/16 0819 04/09/16 1326  BP: (!) 134/55 (!) 134/50  Pulse: 79 74  Resp: (!) 22 (!) 22  Temp:  97.8 F (36.6 C)   GENERAL:82 y.o.female currently in no acute distress.  HEAD: Normocephalic, atraumatic.  EYES: Pupils equal, round, reactive to light. Extraocular muscles intact. No scleral icterus.  MOUTH: Moist mucosal membrane. Dentition intact. No abscess noted.  EAR, NOSE,  THROAT: Clear without exudates. No external lesions.  NECK: Supple. No thyromegaly. No nodules. No JVD.  PULMONARY: diminished breath sounds with expiratory wheeze  No use of accessory muscles, Good respiratory effort. good air entry bilaterally CHEST: Nontender to palpation.  CARDIOVASCULAR: S1 and S2. Regular rate and rhythm. No murmurs, rubs, or gallops. No edema. Pedal pulses 2+ bilaterally.  GASTROINTESTINAL: Soft, nontender, nondistended. No masses. Positive bowel sounds. No hepatosplenomegaly.  MUSCULOSKELETAL: No swelling, clubbing, or edema. Range of motion full in all extremities.  NEUROLOGIC: Cranial nerves II through XII are intact. No gross focal neurological deficits. Sensation intact. Reflexes intact.  SKIN: No ulceration, lesions, rashes, or cyanosis. Skin warm and dry. Turgor intact.  PSYCHIATRIC: Mood, affect within normal limits. The patient is awake, alert and oriented x 3. Insight, judgment intact.      LABORATORY PANEL:   CBC  Recent Labs Lab 04/08/16 0519  WBC 4.5  HGB 12.1  HCT 35.3  PLT 156   ------------------------------------------------------------------------------------------------------------------  Chemistries   Recent Labs Lab 04/07/16 1539  04/09/16 0554  NA 135  < > 140  K 3.3*  < > 4.2  CL 102  < > 110  CO2 22  < > 22  GLUCOSE 121*  < > 298*  BUN 25*  < > 35*  CREATININE 1.22*  < > 1.30*  CALCIUM 8.6*  < > 8.4*  MG 1.6*  --   --   AST 35  --   --   ALT 18  --   --   ALKPHOS 62  --   --  BILITOT 2.4*  --   --   < > = values in this interval not displayed. ------------------------------------------------------------------------------------------------------------------  Cardiac Enzymes  Recent Labs Lab 04/07/16 1539  TROPONINI <0.03   ------------------------------------------------------------------------------------------------------------------  RADIOLOGY:  Dg Chest 2 View  Result Date: 04/07/2016 CLINICAL DATA:   Cough x4 days EXAM: CHEST  2 VIEW COMPARISON:  12/12/2014 FINDINGS: Mild bibasilar atelectasis. No focal consolidation. No pleural effusion or pneumothorax. Cardiomegaly. Left subclavian pacemaker. IMPRESSION: No evidence of acute cardiopulmonary disease. Electronically Signed   By: Julian Hy M.D.   On: 04/07/2016 15:51    EKG:   Orders placed or performed during the hospital encounter of 04/07/16  . ED EKG  . ED EKG  . EKG 12-Lead  . EKG 12-Lead    ASSESSMENT AND PLAN:   Sarah Dougherty is a 82 y.o. female presenting with Cough . Admitted 04/07/2016 : Day #: 2 days 1. COPD exacerbation, acute: Continue with doxycycline, breathing treatments, steroids-decrease 2. Type 2 diabetes non-insulin-requiring: Poorly controlled continue sliding scale and add pre-meal boluses decrease steroids 3. Hypertension essential: Norvasc 4. Acute kidney injury: Resolved 5. Atrial fibrillation chronic, on Xarelto  Disposition-SNF, possibly tomorrow   All the records are reviewed and case discussed with Care Management/Social Workerr. Management plans discussed with the patient, family and they are in agreement.  CODE STATUS: full TOTAL TIME TAKING CARE OF THIS PATIENT: 28 minutes.   POSSIBLE D/C IN 1-2DAYS, DEPENDING ON CLINICAL CONDITION.   Hower,  Karenann Cai.D on 04/09/2016 at 1:55 PM  Between 7am to 6pm - Pager - [phone]  After 6pm: House Pager: - [facility]  Office  [phone]  CC: Primary care physician; Perrin Maltese, MD

## 2016-04-10 ENCOUNTER — Encounter
Admission: RE | Admit: 2016-04-10 | Discharge: 2016-04-10 | Disposition: A | Discharge status: Home / Self Care | Payer: PPO | Source: Ambulatory Visit | Attending: Internal Medicine | Admitting: Internal Medicine

## 2016-04-10 DIAGNOSIS — Z9981 Dependence on supplemental oxygen: Secondary | ICD-10-CM | POA: Diagnosis not present

## 2016-04-10 DIAGNOSIS — M6281 Muscle weakness (generalized): Secondary | ICD-10-CM | POA: Diagnosis not present

## 2016-04-10 DIAGNOSIS — R1312 Dysphagia, oropharyngeal phase: Secondary | ICD-10-CM | POA: Diagnosis not present

## 2016-04-10 DIAGNOSIS — R262 Difficulty in walking, not elsewhere classified: Secondary | ICD-10-CM | POA: Diagnosis not present

## 2016-04-10 DIAGNOSIS — I482 Chronic atrial fibrillation: Secondary | ICD-10-CM | POA: Diagnosis not present

## 2016-04-10 DIAGNOSIS — Z7984 Long term (current) use of oral hypoglycemic drugs: Secondary | ICD-10-CM | POA: Diagnosis not present

## 2016-04-10 DIAGNOSIS — J441 Chronic obstructive pulmonary disease with (acute) exacerbation: Secondary | ICD-10-CM | POA: Diagnosis not present

## 2016-04-10 DIAGNOSIS — I10 Essential (primary) hypertension: Secondary | ICD-10-CM | POA: Diagnosis not present

## 2016-04-10 DIAGNOSIS — G4733 Obstructive sleep apnea (adult) (pediatric): Secondary | ICD-10-CM | POA: Diagnosis not present

## 2016-04-10 DIAGNOSIS — E119 Type 2 diabetes mellitus without complications: Secondary | ICD-10-CM | POA: Diagnosis not present

## 2016-04-10 DIAGNOSIS — Z950 Presence of cardiac pacemaker: Secondary | ICD-10-CM | POA: Diagnosis not present

## 2016-04-10 DIAGNOSIS — J9601 Acute respiratory failure with hypoxia: Secondary | ICD-10-CM | POA: Diagnosis not present

## 2016-04-10 DIAGNOSIS — Z7901 Long term (current) use of anticoagulants: Secondary | ICD-10-CM | POA: Diagnosis not present

## 2016-04-10 DIAGNOSIS — I2510 Atherosclerotic heart disease of native coronary artery without angina pectoris: Secondary | ICD-10-CM | POA: Diagnosis not present

## 2016-04-10 DIAGNOSIS — I4891 Unspecified atrial fibrillation: Secondary | ICD-10-CM | POA: Diagnosis not present

## 2016-04-10 LAB — GLUCOSE, CAPILLARY
GLUCOSE-CAPILLARY: 245 mg/dL — AB (ref 65–99)
GLUCOSE-CAPILLARY: 211 mg/dL — AB (ref 65–99)

## 2016-04-10 LAB — PROCALCITONIN: Procalcitonin: 0.14 ng/mL

## 2016-04-10 MED ORDER — TIOTROPIUM BROMIDE MONOHYDRATE 18 MCG IN CAPS: 18.0000 ug | ORAL_CAPSULE | Freq: Every day | RESPIRATORY_TRACT | 12 refills | Status: DC

## 2016-04-10 MED ORDER — IPRATROPIUM-ALBUTEROL 0.5-2.5 (3) MG/3ML IN SOLN: 3.0000 mL | RESPIRATORY_TRACT | 0 refills | Status: DC | PRN

## 2016-04-10 MED ORDER — GUAIFENESIN 100 MG/5ML PO SOLN: 10.0000 mL | ORAL | 0 refills | Status: DC | PRN

## 2016-04-10 MED ORDER — DOXYCYCLINE HYCLATE 100 MG PO TABS: 100.0000 mg | ORAL_TABLET | Freq: Two times a day (BID) | ORAL | 0 refills | Status: AC

## 2016-04-10 MED ORDER — PREDNISONE 10 MG PO TABS: ORAL_TABLET | ORAL | 0 refills | Status: DC

## 2016-04-10 NOTE — Progress Notes (Signed)
Patient is medically stable for D/C to [facility] today. Per [facility] admissions coordinator at [facility] patient will go to room 210-B. RN will call report at [phone] and arrange EMS for transport. Health Team authorization has been received. Auth # [id number]. Clinical Education officer, museum (CSW) sent D/C orders to [employer] via [employer]. Patient and son [name] aware of above. [name] is at bedside. Please reconsult if future social work needs arise. CSW signing off.   [employer], LCSW [phone]

## 2016-04-10 NOTE — Clinical Social Work Placement (Signed)
   CLINICAL SOCIAL WORK PLACEMENT  NOTE  Date:  04/10/2016  Patient Details  Name: Sarah Dougherty MRN: [id number] Date of Birth: 10-30-1921  Clinical Social Work is seeking post-discharge placement for this patient at the [place] level of care (*CSW will initial, date and re-position this form in  chart as items are completed):  Yes   Patient/family provided with [facility] Work Department's list of facilities offering this level of care within the geographic area requested by the patient (or if unable, by the patient's family).  Yes   Patient/family informed of their freedom to choose among providers that offer the needed level of care, that participate in Medicare, Medicaid or managed care program needed by the patient, have an available bed and are willing to accept the patient.  Yes   Patient/family informed of [facility]'s ownership interest in [facility] and [facility], as well as of the fact that they are under no obligation to receive care at these facilities.  PASRR submitted to EDS on 04/09/16     PASRR number received on 04/09/16     Existing PASRR number confirmed on       FL2 transmitted to all facilities in geographic area requested by pt/family on 04/09/16     FL2 transmitted to all facilities within larger geographic area on       Patient informed that his/her managed care company has contracts with or will negotiate with certain facilities, including the following:        Yes   Patient/family informed of bed offers received.  Patient chooses bed at  [facility] )     Physician recommends and patient chooses bed at      Patient to be transferred to  [facility] ) on 04/10/16.  Patient to be transferred to facility by  [facility] )     Patient family notified on 04/10/16 of transfer.  Name of family member notified:   (Patient's son [name] is at bedside and aware of D/C today. )     PHYSICIAN     Additional Comment:    _______________________________________________ Rilea Arutyunyan, Veronia Beets, LCSW 04/10/2016, 11:55 AM

## 2016-04-10 NOTE — Progress Notes (Signed)
Pt is being discharged to [facility]. Report given to [facility] [place]. Discharge packet in pt's slot. Awaiting EMS.

## 2016-04-10 NOTE — Discharge Summary (Signed)
Sarah Dougherty at [place] NAME: Sarah Dougherty    MR#:  [id number]  DATE OF BIRTH:  January 10, 1922  DATE OF ADMISSION:  04/07/2016 ADMITTING PHYSICIAN: Vaughan Basta, MD  DATE OF DISCHARGE: 04/10/16  PRIMARY CARE PHYSICIAN: Perrin Maltese, MD    ADMISSION DIAGNOSIS:  Hypomagnesemia [E83.42] Acute respiratory failure with hypoxia (HCC) [J96.01] Acute bronchitis, unspecified organism [J20.9]  DISCHARGE DIAGNOSIS:  Acute respiratory failure with hypoxia Copd exacerbation  SECONDARY DIAGNOSIS:   Past Medical History:  Diagnosis Date  . COPD (chronic obstructive pulmonary disease) ([place])   . Coronary artery disease   . Diabetes mellitus without complication ([place])   . Hypertension   . On home oxygen therapy    uses at night    HOSPITAL COURSE:  Sarah Dougherty  is a 82 y.o. female admitted 04/07/2016 with chief complaint Cough . Please see H&P performed by Vaughan Basta, MD for further information. Patient presented with the above symptoms including shortness of breath. Secondary to copd exacerbation, requiring 2L oxygen. Improvement with steroids, doxycycline, breathing treatments. Evaluated by PT recommended SNF - patient and family agree  DISCHARGE CONDITIONS:   stable  CONSULTS OBTAINED:    DRUG ALLERGIES:   Allergies  Allergen Reactions  . Nitrofurantoin     Other reaction(s): Unknown  . Penicillins     Other reaction(s): Unknown  . Sulfa Antibiotics     Other reaction(s): Unknown  . Azithromycin Rash    DISCHARGE MEDICATIONS:   Current Discharge Medication List    START taking these medications   Details  doxycycline (VIBRA-TABS) 100 MG tablet Take 1 tablet (100 mg total) by mouth every 12 (twelve) hours. Qty: 8 tablet, Refills: 0    guaiFENesin (ROBITUSSIN) 100 MG/5ML SOLN Take 10 mLs (200 mg total) by mouth every 4 (four) hours as needed for cough or to loosen phlegm. Qty: 1200 mL, Refills: 0      ipratropium-albuterol (DUONEB) 0.5-2.5 (3) MG/3ML SOLN Take 3 mLs by nebulization every 4 (four) hours as needed. Qty: 360 mL, Refills: 0    predniSONE (DELTASONE) 10 MG tablet 40mg x1 day, 20mg  x2 day, 10mg  x2 day then stop Qty: 10 tablet, Refills: 0    tiotropium (SPIRIVA) 18 MCG inhalation capsule Place 1 capsule (18 mcg total) into inhaler and inhale daily. Qty: 30 capsule, Refills: 12      CONTINUE these medications which have NOT CHANGED   Details  acetaminophen (TYLENOL 8 HOUR) 650 MG CR tablet Take 650 mg by mouth every 8 (eight) hours as needed for pain.    amLODipine (NORVASC) 5 MG tablet Take 5 mg by mouth daily.    clotrimazole (GYNE-LOTRIMIN) 1 % vaginal cream Place 1 application vaginally daily as needed. For yeast infection.    glimepiride (AMARYL) 2 MG tablet Take 2 mg by mouth daily.    hydrocortisone cream (NEOSPORIN ECZEMA ESSENT MAX ST) 1 % Apply 1 application topically 2 (two) times daily as needed for itching (for sores.).    Rivaroxaban (XARELTO PO) Take 1 tablet by mouth daily.    sodium chloride (OCEAN) 0.65 % nasal spray Place 2 sprays into the nose daily as needed.      STOP taking these medications     ciprofloxacin (CIPRO) 250 MG tablet          DISCHARGE INSTRUCTIONS:    DIET:  Diabetic diet  DISCHARGE CONDITION:  Stable  ACTIVITY:  Activity as tolerated  OXYGEN:  Home Oxygen: Yes.  Oxygen Delivery: 2 liters/min via Patient connected to nasal cannula oxygen  DISCHARGE LOCATION:  nursing home   If you experience worsening of your admission symptoms, develop shortness of breath, life threatening emergency, suicidal or homicidal thoughts you must seek medical attention immediately by calling 911 or calling your MD immediately  if symptoms less severe.  You Must read complete instructions/literature along with all the possible adverse reactions/side effects for all the Medicines you take and that have been prescribed to you. Take  any new Medicines after you have completely understood and accpet all the possible adverse reactions/side effects.   Please note  You were cared for by a hospitalist during your hospital stay. If you have any questions about your discharge medications or the care you received while you were in the hospital after you are discharged, you can call the unit and asked to speak with the hospitalist on call if the hospitalist that took care of you is not available. Once you are discharged, your primary care physician will handle any further medical issues. Please note that NO REFILLS for any discharge medications will be authorized once you are discharged, as it is imperative that you return to your primary care physician (or establish a relationship with a primary care physician if you do not have one) for your aftercare needs so that they can reassess your need for medications and monitor your lab values.    On the day of Discharge:   VITAL SIGNS:  Blood pressure (!) 141/64, pulse 77, temperature 98.1 F (36.7 C), temperature source Oral, resp. rate (!) 22, height 5\' 2"  (1.575 m), weight 56.9 kg (125 lb 6 oz), SpO2 96 %.  I/O:   Intake/Output Summary (Last 24 hours) at 04/10/16 0913 Last data filed at 04/10/16 0455  Gross per 24 hour  Intake              480 ml  Output                0 ml  Net              480 ml    PHYSICAL EXAMINATION:  GENERAL:  82 y.o.-year-old patient lying in the bed with no acute distress. Still coughing EYES: Pupils equal, round, reactive to light and accommodation. No scleral icterus. Extraocular muscles intact.  HEENT: Head atraumatic, normocephalic. Oropharynx and nasopharynx clear.  NECK:  Supple, no jugular venous distention. No thyroid enlargement, no tenderness.  LUNGS: improved breath sounds bilaterally, scant wheezing, no rales,rhonchi or crepitation. No use of accessory muscles of respiration.  CARDIOVASCULAR: S1, S2 normal. No murmurs, rubs, or gallops.    ABDOMEN: Soft, non-tender, non-distended. Bowel sounds present. No organomegaly or mass.  EXTREMITIES: No pedal edema, cyanosis, or clubbing.  NEUROLOGIC: Cranial nerves II through XII are intact. Muscle strength 5/5 in all extremities. Sensation intact. Gait not checked.  PSYCHIATRIC: The patient is alert and oriented x 3.  SKIN: No obvious rash, lesion, or ulcer.   DATA REVIEW:   CBC  Recent Labs Lab 04/08/16 0519  WBC 4.5  HGB 12.1  HCT 35.3  PLT 156    Chemistries   Recent Labs Lab 04/07/16 1539  04/09/16 0554  NA 135  < > 140  K 3.3*  < > 4.2  CL 102  < > 110  CO2 22  < > 22  GLUCOSE 121*  < > 298*  BUN 25*  < > 35*  CREATININE 1.22*  < > 1.30*  CALCIUM 8.6*  < > 8.4*  MG 1.6*  --   --   AST 35  --   --   ALT 18  --   --   ALKPHOS 62  --   --   BILITOT 2.4*  --   --   < > = values in this interval not displayed.  Cardiac Enzymes  Recent Labs Lab 04/07/16 1539  TROPONINI <0.03    Microbiology Results  Results for orders placed or performed during the hospital encounter of 12/09/14  Urine culture     Status: None   Collection Time: 12/12/14 11:01 PM  Result Value Ref Range Status   Specimen Description URINE, CLEAN CATCH  Final   Special Requests Normal  Final   Culture 6,000 COLONIES/mL INSIGNIFICANT GROWTH  Final   Report Status 12/14/2014 FINAL  Final    RADIOLOGY:  No results found.   Management plans discussed with the patient, family and they are in agreement.  CODE STATUS:     Code Status Orders        Start     Ordered   04/07/16 2109  Full code  Continuous     04/07/16 2108    Code Status History    Date Active Date Inactive Code Status Order ID Comments User Context   12/12/2014  2:52 PM 12/13/2014  6:28 PM Full Code [id number]  Isaias Cowman, MD Inpatient   12/09/2014 11:03 PM 12/12/2014  2:52 PM Full Code [id number]  Baxter Hire, MD Inpatient    Advance Directive Documentation   Flowsheet Row Most Recent Value   Type of Advance Directive  Healthcare Power of Attorney  Pre-existing out of facility DNR order (yellow form or pink MOST form)  No data  "MOST" Form in Place?  No data      TOTAL TIME TAKING CARE OF THIS PATIENT: 33 minutes.    Hower,  Karenann Cai.D on 04/10/2016 at 9:13 AM  Between 7am to 6pm - Pager - [phone]  After 6pm go to [email or web address] - Proofreader  [employer] [facility]  Office  [phone]  CC: Primary care physician; Perrin Maltese, MD

## 2016-04-11 DIAGNOSIS — I4891 Unspecified atrial fibrillation: Secondary | ICD-10-CM | POA: Diagnosis not present

## 2016-04-11 DIAGNOSIS — J449 Chronic obstructive pulmonary disease, unspecified: Secondary | ICD-10-CM | POA: Diagnosis not present

## 2016-04-11 DIAGNOSIS — I2510 Atherosclerotic heart disease of native coronary artery without angina pectoris: Secondary | ICD-10-CM | POA: Diagnosis not present

## 2016-04-11 DIAGNOSIS — J9611 Chronic respiratory failure with hypoxia: Secondary | ICD-10-CM | POA: Diagnosis not present

## 2016-04-11 DIAGNOSIS — E119 Type 2 diabetes mellitus without complications: Secondary | ICD-10-CM | POA: Diagnosis not present

## 2016-04-11 DIAGNOSIS — N183 Chronic kidney disease, stage 3 (moderate): Secondary | ICD-10-CM | POA: Diagnosis not present

## 2016-04-14 ENCOUNTER — Encounter
Admission: RE | Admit: 2016-04-14 | Discharge: 2016-04-14 | Disposition: A | Discharge status: Home / Self Care | Payer: PPO | Source: Ambulatory Visit | Attending: Internal Medicine | Admitting: Internal Medicine

## 2016-04-14 DIAGNOSIS — J441 Chronic obstructive pulmonary disease with (acute) exacerbation: Secondary | ICD-10-CM | POA: Diagnosis not present

## 2016-04-14 DIAGNOSIS — I2510 Atherosclerotic heart disease of native coronary artery without angina pectoris: Secondary | ICD-10-CM | POA: Diagnosis not present

## 2016-04-14 DIAGNOSIS — Z7984 Long term (current) use of oral hypoglycemic drugs: Secondary | ICD-10-CM | POA: Diagnosis not present

## 2016-04-14 DIAGNOSIS — R262 Difficulty in walking, not elsewhere classified: Secondary | ICD-10-CM | POA: Diagnosis not present

## 2016-04-14 DIAGNOSIS — J9601 Acute respiratory failure with hypoxia: Secondary | ICD-10-CM | POA: Diagnosis not present

## 2016-04-14 DIAGNOSIS — M6281 Muscle weakness (generalized): Secondary | ICD-10-CM | POA: Diagnosis not present

## 2016-04-14 DIAGNOSIS — Z9981 Dependence on supplemental oxygen: Secondary | ICD-10-CM | POA: Diagnosis not present

## 2016-04-14 DIAGNOSIS — E119 Type 2 diabetes mellitus without complications: Secondary | ICD-10-CM | POA: Insufficient documentation

## 2016-04-14 DIAGNOSIS — Z950 Presence of cardiac pacemaker: Secondary | ICD-10-CM | POA: Diagnosis not present

## 2016-04-14 DIAGNOSIS — Z7901 Long term (current) use of anticoagulants: Secondary | ICD-10-CM | POA: Diagnosis not present

## 2016-04-14 DIAGNOSIS — I482 Chronic atrial fibrillation: Secondary | ICD-10-CM | POA: Diagnosis not present

## 2016-04-14 DIAGNOSIS — R1312 Dysphagia, oropharyngeal phase: Secondary | ICD-10-CM | POA: Diagnosis not present

## 2016-04-14 DIAGNOSIS — I10 Essential (primary) hypertension: Secondary | ICD-10-CM | POA: Diagnosis not present

## 2016-04-15 DIAGNOSIS — E119 Type 2 diabetes mellitus without complications: Secondary | ICD-10-CM | POA: Diagnosis not present

## 2016-04-15 LAB — GLUCOSE, CAPILLARY
GLUCOSE-CAPILLARY: 80 mg/dL (ref 65–99)
Glucose-Capillary: 218 mg/dL — ABNORMAL HIGH (ref 65–99)
Glucose-Capillary: 295 mg/dL — ABNORMAL HIGH (ref 65–99)
Glucose-Capillary: 239 mg/dL — ABNORMAL HIGH (ref 65–99)

## 2016-04-16 LAB — GLUCOSE, CAPILLARY
GLUCOSE-CAPILLARY: 290 mg/dL — AB (ref 65–99)
GLUCOSE-CAPILLARY: 224 mg/dL — AB (ref 65–99)
GLUCOSE-CAPILLARY: 274 mg/dL — AB (ref 65–99)
GLUCOSE-CAPILLARY: 200 mg/dL — AB (ref 65–99)
Glucose-Capillary: 224 mg/dL — ABNORMAL HIGH (ref 65–99)
Glucose-Capillary: 144 mg/dL — ABNORMAL HIGH (ref 65–99)

## 2016-04-17 DIAGNOSIS — E119 Type 2 diabetes mellitus without complications: Secondary | ICD-10-CM | POA: Diagnosis not present

## 2016-04-17 LAB — GLUCOSE, CAPILLARY
GLUCOSE-CAPILLARY: 129 mg/dL — AB (ref 65–99)
Glucose-Capillary: 166 mg/dL — ABNORMAL HIGH (ref 65–99)
Glucose-Capillary: 299 mg/dL — ABNORMAL HIGH (ref 65–99)
Glucose-Capillary: 212 mg/dL — ABNORMAL HIGH (ref 65–99)

## 2016-04-18 DIAGNOSIS — E119 Type 2 diabetes mellitus without complications: Secondary | ICD-10-CM | POA: Diagnosis not present

## 2016-04-18 LAB — GLUCOSE, CAPILLARY
GLUCOSE-CAPILLARY: 160 mg/dL — AB (ref 65–99)
Glucose-Capillary: 262 mg/dL — ABNORMAL HIGH (ref 65–99)
Glucose-Capillary: 111 mg/dL — ABNORMAL HIGH (ref 65–99)
Glucose-Capillary: 260 mg/dL — ABNORMAL HIGH (ref 65–99)

## 2016-04-19 DIAGNOSIS — E119 Type 2 diabetes mellitus without complications: Secondary | ICD-10-CM | POA: Diagnosis not present

## 2016-04-19 LAB — GLUCOSE, CAPILLARY
GLUCOSE-CAPILLARY: 107 mg/dL — AB (ref 65–99)
GLUCOSE-CAPILLARY: 224 mg/dL — AB (ref 65–99)
Glucose-Capillary: 199 mg/dL — ABNORMAL HIGH (ref 65–99)
Glucose-Capillary: 188 mg/dL — ABNORMAL HIGH (ref 65–99)

## 2016-04-20 DIAGNOSIS — E119 Type 2 diabetes mellitus without complications: Secondary | ICD-10-CM | POA: Diagnosis not present

## 2016-04-20 LAB — GLUCOSE, CAPILLARY
GLUCOSE-CAPILLARY: 198 mg/dL — AB (ref 65–99)
Glucose-Capillary: 218 mg/dL — ABNORMAL HIGH (ref 65–99)
Glucose-Capillary: 159 mg/dL — ABNORMAL HIGH (ref 65–99)

## 2016-04-21 DIAGNOSIS — E119 Type 2 diabetes mellitus without complications: Secondary | ICD-10-CM | POA: Diagnosis not present

## 2016-04-21 LAB — GLUCOSE, CAPILLARY
GLUCOSE-CAPILLARY: 155 mg/dL — AB (ref 65–99)
Glucose-Capillary: 247 mg/dL — ABNORMAL HIGH (ref 65–99)

## 2016-04-22 DIAGNOSIS — F339 Major depressive disorder, recurrent, unspecified: Secondary | ICD-10-CM | POA: Diagnosis not present

## 2016-04-22 DIAGNOSIS — G3184 Mild cognitive impairment, so stated: Secondary | ICD-10-CM | POA: Diagnosis not present

## 2016-04-22 LAB — GLUCOSE, CAPILLARY
GLUCOSE-CAPILLARY: 119 mg/dL — AB (ref 65–99)
GLUCOSE-CAPILLARY: 127 mg/dL — AB (ref 65–99)
Glucose-Capillary: 233 mg/dL — ABNORMAL HIGH (ref 65–99)
Glucose-Capillary: 211 mg/dL — ABNORMAL HIGH (ref 65–99)
Glucose-Capillary: 179 mg/dL — ABNORMAL HIGH (ref 65–99)

## 2016-04-23 DIAGNOSIS — E119 Type 2 diabetes mellitus without complications: Secondary | ICD-10-CM | POA: Diagnosis not present

## 2016-04-23 LAB — GLUCOSE, CAPILLARY
GLUCOSE-CAPILLARY: 97 mg/dL (ref 65–99)
GLUCOSE-CAPILLARY: 131 mg/dL — AB (ref 65–99)
GLUCOSE-CAPILLARY: 162 mg/dL — AB (ref 65–99)
Glucose-Capillary: 217 mg/dL — ABNORMAL HIGH (ref 65–99)

## 2016-04-24 DIAGNOSIS — E119 Type 2 diabetes mellitus without complications: Secondary | ICD-10-CM | POA: Diagnosis not present

## 2016-04-24 LAB — GLUCOSE, CAPILLARY
GLUCOSE-CAPILLARY: 91 mg/dL (ref 65–99)
Glucose-Capillary: 281 mg/dL — ABNORMAL HIGH (ref 65–99)
Glucose-Capillary: 81 mg/dL (ref 65–99)
Glucose-Capillary: 190 mg/dL — ABNORMAL HIGH (ref 65–99)

## 2016-04-25 ENCOUNTER — Non-Acute Institutional Stay (SKILLED_NURSING_FACILITY): Payer: PPO | Admitting: Gerontology

## 2016-04-25 DIAGNOSIS — J441 Chronic obstructive pulmonary disease with (acute) exacerbation: Secondary | ICD-10-CM

## 2016-04-25 DIAGNOSIS — M25461 Effusion, right knee: Secondary | ICD-10-CM | POA: Diagnosis not present

## 2016-04-25 DIAGNOSIS — M25561 Pain in right knee: Secondary | ICD-10-CM | POA: Diagnosis not present

## 2016-04-25 DIAGNOSIS — M7989 Other specified soft tissue disorders: Secondary | ICD-10-CM | POA: Diagnosis not present

## 2016-04-25 DIAGNOSIS — E119 Type 2 diabetes mellitus without complications: Secondary | ICD-10-CM | POA: Diagnosis not present

## 2016-04-25 DIAGNOSIS — R0600 Dyspnea, unspecified: Secondary | ICD-10-CM | POA: Diagnosis not present

## 2016-04-25 DIAGNOSIS — R0989 Other specified symptoms and signs involving the circulatory and respiratory systems: Secondary | ICD-10-CM | POA: Diagnosis not present

## 2016-04-26 LAB — GLUCOSE, CAPILLARY
GLUCOSE-CAPILLARY: 247 mg/dL — AB (ref 65–99)
GLUCOSE-CAPILLARY: 204 mg/dL — AB (ref 65–99)
Glucose-Capillary: 160 mg/dL — ABNORMAL HIGH (ref 65–99)
Glucose-Capillary: 227 mg/dL — ABNORMAL HIGH (ref 65–99)
Glucose-Capillary: 120 mg/dL — ABNORMAL HIGH (ref 65–99)
Glucose-Capillary: 184 mg/dL — ABNORMAL HIGH (ref 65–99)
Glucose-Capillary: 173 mg/dL — ABNORMAL HIGH (ref 65–99)
Glucose-Capillary: 225 mg/dL — ABNORMAL HIGH (ref 65–99)

## 2016-04-27 DIAGNOSIS — E119 Type 2 diabetes mellitus without complications: Secondary | ICD-10-CM | POA: Diagnosis not present

## 2016-04-27 LAB — GLUCOSE, CAPILLARY
GLUCOSE-CAPILLARY: 87 mg/dL (ref 65–99)
GLUCOSE-CAPILLARY: 300 mg/dL — AB (ref 65–99)
GLUCOSE-CAPILLARY: 127 mg/dL — AB (ref 65–99)
GLUCOSE-CAPILLARY: 169 mg/dL — AB (ref 65–99)

## 2016-04-28 DIAGNOSIS — E119 Type 2 diabetes mellitus without complications: Secondary | ICD-10-CM | POA: Diagnosis not present

## 2016-04-28 LAB — GLUCOSE, CAPILLARY
GLUCOSE-CAPILLARY: 111 mg/dL — AB (ref 65–99)
Glucose-Capillary: 182 mg/dL — ABNORMAL HIGH (ref 65–99)
Glucose-Capillary: 130 mg/dL — ABNORMAL HIGH (ref 65–99)
Glucose-Capillary: 183 mg/dL — ABNORMAL HIGH (ref 65–99)

## 2016-04-29 ENCOUNTER — Non-Acute Institutional Stay (SKILLED_NURSING_FACILITY): Payer: PPO | Admitting: Gerontology

## 2016-04-29 DIAGNOSIS — M25461 Effusion, right knee: Secondary | ICD-10-CM

## 2016-04-29 DIAGNOSIS — E119 Type 2 diabetes mellitus without complications: Secondary | ICD-10-CM | POA: Diagnosis not present

## 2016-04-29 DIAGNOSIS — J441 Chronic obstructive pulmonary disease with (acute) exacerbation: Secondary | ICD-10-CM | POA: Diagnosis not present

## 2016-04-29 LAB — GLUCOSE, CAPILLARY
Glucose-Capillary: 118 mg/dL — ABNORMAL HIGH (ref 65–99)
Glucose-Capillary: 125 mg/dL — ABNORMAL HIGH (ref 65–99)

## 2016-05-04 NOTE — Progress Notes (Signed)
Location:      Place of Service:  SNF (31) Provider:  Toni Arthurs, NP-C  Perrin Maltese, MD  Patient Care Team: Perrin Maltese, MD as PCP - General (Internal Medicine) Perrin Maltese, MD as Referring Physician (Internal Medicine) Robert Bellow, MD (General Surgery)  Extended Emergency Contact Information Primary Emergency Contact: Tonye Pearson Address: [address]          [place], [place] [address] Johnnette Litter of [place] Phone: [phone] Relation: Son Secondary Emergency Contact: [place] of [place] Phone: [phone] Relation: Son  Code Status:  full Goals of care: Advanced Directive information Advanced Directives 04/07/2016  Does Patient Have a Medical Advance Directive? Yes  Type of Advance Directive [place]  Does patient want to make changes to medical advance directive? No - Patient declined  Copy of [place] in Chart? No - copy requested     Chief Complaint  Patient presents with  . Acute Visit    HPI:  Pt is a 83 y.o. female seen today for an acute visit for c/o dyspnea and congestion as well as right knee pain. Pt has a h/o COPD and is only on Spiriva at this time. Pt reports increase in congestion with occasional productive cough. She feels she is short of breath. She is also being followed by SLP for possible aspiration. She reports pain in the right knee. Pt denies trauma/ falling on the knee. She does say she feels she "over-did it" with therapy the day before. Says her knee has been hurting since therapy. It is tender to touch, moderately edematous to the lateral aspect of the knee. Slightly warm to touch. No redness. No break in skin integrity. No crepitus. VSS. No other complaints.    Past Medical History:  Diagnosis Date  . COPD (chronic obstructive pulmonary disease) ([place])   . Coronary artery disease   . Diabetes mellitus without complication ([place])   .  Hypertension   . On home oxygen therapy    uses at night   Past Surgical History:  Procedure Laterality Date  . PACEMAKER INSERTION Left 12/12/2014   Procedure: INSERTION PACEMAKER;  Surgeon: Isaias Cowman, MD;  Location: [facility] ORS;  Service: Cardiovascular;  Laterality: Left;    Allergies  Allergen Reactions  . Nitrofurantoin     Other reaction(s): Unknown  . Penicillins     Other reaction(s): Unknown  . Sulfa Antibiotics     Other reaction(s): Unknown  . Azithromycin Rash    Allergies as of 04/25/2016      Reactions   Nitrofurantoin    Other reaction(s): Unknown   Penicillins    Other reaction(s): Unknown   Sulfa Antibiotics    Other reaction(s): Unknown   Azithromycin Rash      Medication List       Accurate as of 04/25/16 11:59 PM. Always use your most recent med list.          amLODipine 5 MG tablet Commonly known as:  NORVASC Take 5 mg by mouth daily.   clotrimazole 1 % vaginal cream Commonly known as:  GYNE-LOTRIMIN Place 1 application vaginally daily as needed. For yeast infection.   glimepiride 2 MG tablet Commonly known as:  AMARYL Take 2 mg by mouth daily.   guaiFENesin 100 MG/5ML Soln Commonly known as:  ROBITUSSIN Take 10 mLs (200 mg total) by mouth every 4 (four) hours as needed for cough or to loosen phlegm.   ipratropium-albuterol  0.5-2.5 (3) MG/3ML Soln Commonly known as:  DUONEB Take 3 mLs by nebulization every 4 (four) hours as needed.   NEOSPORIN ECZEMA ESSENT MAX ST 1 % Generic drug:  hydrocortisone cream Apply 1 application topically 2 (two) times daily as needed for itching (for sores.).   predniSONE 10 MG tablet Commonly known as:  DELTASONE 40mg x1 day, 20mg  x2 day, 10mg  x2 day then stop   sodium chloride 0.65 % nasal spray Commonly known as:  OCEAN Place 2 sprays into the nose daily as needed.   tiotropium 18 MCG inhalation capsule Commonly known as:  SPIRIVA Place 1 capsule (18 mcg total) into inhaler and inhale  daily.   TYLENOL 8 HOUR 650 MG CR tablet Generic drug:  acetaminophen Take 650 mg by mouth every 8 (eight) hours as needed for pain.   XARELTO PO Take 1 tablet by mouth daily.       Review of Systems  Constitutional: Negative for activity change, appetite change, chills, diaphoresis and fever.  HENT: Positive for congestion. Negative for rhinorrhea, sinus pain, sinus pressure, sneezing, sore throat, trouble swallowing and voice change.   Respiratory: Positive for cough and shortness of breath. Negative for apnea, choking, chest tightness and wheezing.   Cardiovascular: Negative for chest pain and palpitations.  Gastrointestinal: Negative for abdominal distention, abdominal pain, constipation, diarrhea and nausea.  Genitourinary: Negative for difficulty urinating, dysuria, frequency and urgency.  Musculoskeletal: Positive for arthralgias (typical arthritis) and joint swelling. Negative for back pain, gait problem and myalgias.  Skin: Negative for color change, pallor, rash and wound.  Neurological: Negative for dizziness, tremors, syncope, speech difficulty, weakness, numbness and headaches.  Psychiatric/Behavioral: Negative for agitation and behavioral problems.  All other systems reviewed and are negative.   Immunization History  Administered Date(s) Administered  . Pneumococcal Polysaccharide-23 12/11/2014   Pertinent  Health Maintenance Due  Topic Date Due  . DEXA SCAN  11/17/1988  . INFLUENZA VACCINE  11/13/2015  . PNA vac Low Risk Adult (2 of 2 - PCV13) 12/11/2015   No flowsheet data found. Functional Status Survey:    Vitals:   04/25/16 0540  BP: (!) 127/45  Pulse: 64  Resp: (!) 22  Temp: 98.6 F (37 C)  SpO2: 97%   There is no height or weight on file to calculate BMI. Physical Exam  Constitutional: She is oriented to person, place, and time. Vital signs are normal. She appears well-developed and well-nourished. She is active and cooperative. She does not  appear ill. No distress.  HENT:  Head: Normocephalic and atraumatic.  Mouth/Throat: Uvula is midline, oropharynx is clear and moist and mucous membranes are normal. Mucous membranes are not pale, not dry and not cyanotic.  Eyes: Conjunctivae, EOM and lids are normal. Pupils are equal, round, and reactive to light.  Neck: Trachea normal, normal range of motion and full passive range of motion without pain. Neck supple. No JVD present. No tracheal deviation, no edema and no erythema present. No thyromegaly present.  Cardiovascular: Normal rate, regular rhythm, normal heart sounds, intact distal pulses and normal pulses.  Exam reveals no gallop, no distant heart sounds and no friction rub.   No murmur heard. Pulmonary/Chest: Effort normal. No accessory muscle usage. No respiratory distress. She has decreased breath sounds in the right lower field and the left lower field. She has no wheezes. She has rhonchi in the right upper field, the right middle field, the left upper field and the left middle field. She has rales in the  right upper field, the left upper field and the left middle field. She exhibits no tenderness.  Abdominal: Normal appearance and bowel sounds are normal. She exhibits no distension and no ascites. There is no tenderness.  Musculoskeletal: She exhibits no edema.       Right knee: She exhibits decreased range of motion, swelling and effusion. Tenderness found.  Expected osteoarthritis, stiffness; calves soft, supple. Negative Homan's sign.   Neurological: She is alert and oriented to person, place, and time. She has normal strength.  Skin: Skin is warm, dry and intact. She is not diaphoretic. No cyanosis. No pallor. Nails show no clubbing.  Psychiatric: She has a normal mood and affect. Her speech is normal and behavior is normal. Judgment and thought content normal. Cognition and memory are normal.  Nursing note and vitals reviewed.   Labs reviewed:  Recent Labs  04/07/16 1539  04/08/16 0519 04/09/16 0554  NA 135 136 140  K 3.3* 3.2* 4.2  CL 102 103 110  CO2 22 21* 22  GLUCOSE 121* 348* 298*  BUN 25* 34* 35*  CREATININE 1.22* 1.73* 1.30*  CALCIUM 8.6* 8.4* 8.4*  MG 1.6*  --   --     Recent Labs  04/07/16 1539  AST 35  ALT 18  ALKPHOS 62  BILITOT 2.4*  PROT 6.7  ALBUMIN 3.9    Recent Labs  04/07/16 1539 04/08/16 0519  WBC 5.0 4.5  NEUTROABS 3.1  --   HGB 12.7 12.1  HCT 36.6 35.3  MCV 92.6 94.0  PLT 163 156   No results found for: TSH No results found for: HGBA1C No results found for: CHOL, HDL, LDLCALC, LDLDIRECT, TRIG, CHOLHDL  Significant Diagnostic Results in last 30 days:  Dg Chest 2 View  Result Date: 04/07/2016 CLINICAL DATA:  Cough x4 days EXAM: CHEST  2 VIEW COMPARISON:  12/12/2014 FINDINGS: Mild bibasilar atelectasis. No focal consolidation. No pleural effusion or pneumothorax. Cardiomegaly. Left subclavian pacemaker. IMPRESSION: No evidence of acute cardiopulmonary disease. Electronically Signed   By: Julian Hy M.D.   On: 04/07/2016 15:51       Assessment/Plan 1. Knee effusion, right  Acetaminophen 650 mg po QID scheduled for pain  Tramadol 50 mg 1 tablet po Q 4 hours prn pain  Voltaren Gel 1%- 4 grams to the Right knee QID for pain/ effusion  Wrap knee with acewrap QID (d/t removal for Voltaren)- monitor for skin integrity  Ice to knee QID prn for pain, inflammation, swelling  2. COPD exacerbation (HCC)  Breo Ellipta 100-25- 1 inhalation daily for COPD  Duonebs 3 ml IH TID x 3 days, then  Q 6 hours prn  Continue Spiriva 18 mcg IH Q day  Family/ staff Communication:   Total Time:  Documentation:  Face to Face:  Family/Phone:   Labs/tests ordered:  2- view CXR and complete view Right Knee Xrays  Medication list reviewed and assessed for continued appropriateness.  Vikki Ports, NP-C Geriatrics [facility] [phone], [place]  [address] Cell Phone (Mon-Fri 8am-5pm):  [phone] On Call:  [phone] & follow prompts after 5pm & weekends Office Phone:  [phone] Office Fax:  [phone]

## 2016-05-04 NOTE — Progress Notes (Signed)
Location:      Place of Service:  SNF (31) Provider:  Toni Arthurs, NP-C  Perrin Maltese, MD  Patient Care Team: Perrin Maltese, MD as PCP - General (Internal Medicine) Perrin Maltese, MD as Referring Physician (Internal Medicine) Robert Bellow, MD (General Surgery)  Extended Emergency Contact Information Primary Emergency Contact: Tonye Pearson Address: [address]          [place], [place] [address] Johnnette Litter of [place] Phone: [phone] Relation: Son Secondary Emergency Contact: [place] of [place] Phone: [phone] Relation: Son  Code Status:  full Goals of care: Advanced Directive information Advanced Directives 04/07/2016  Does Patient Have a Medical Advance Directive? Yes  Type of Advance Directive [place]  Does patient want to make changes to medical advance directive? No - Patient declined  Copy of [place] in Chart? No - copy requested     Chief Complaint  Patient presents with  . Discharge Note    HPI:  Pt is a 83 y.o. female seen today for discharge evalaution visit for c/o dyspnea and congestion as well as right knee pain. Pt has a h/o COPD with recent hospitalization for acute respiratory failure with copd exacerbation and was only on Spiriva at the time of admission. She was started on [facility] with favorable results. She is also being followed by SLP for possible aspiration. Pt and son re-educated on use of thickeners for prevention of aspiration. She reported pain in the right knee at the last visit. Pt denies trauma/ falling on the knee. She does say she feels she "over-did it" with therapy the day before. Says her knee has been hurting since therapy. It was tender to touch, moderately edematous to the lateral aspect of the knee. Slightly warm to touch. No redness. No break in skin integrity. No crepitus. Xrays of the knee negative for fracture, etc. Condition/ pain  was responsive to tramadol and voltaren. VSS. No other complaints. Pt and son feels she is ready for discharge.     Past Medical History:  Diagnosis Date  . COPD (chronic obstructive pulmonary disease) ([place])   . Coronary artery disease   . Diabetes mellitus without complication ([place])   . Hypertension   . On home oxygen therapy    uses at night   Past Surgical History:  Procedure Laterality Date  . PACEMAKER INSERTION Left 12/12/2014   Procedure: INSERTION PACEMAKER;  Surgeon: Isaias Cowman, MD;  Location: [facility] ORS;  Service: Cardiovascular;  Laterality: Left;    Allergies  Allergen Reactions  . Nitrofurantoin     Other reaction(s): Unknown  . Penicillins     Other reaction(s): Unknown  . Sulfa Antibiotics     Other reaction(s): Unknown  . Azithromycin Rash    Allergies as of 04/29/2016      Reactions   Nitrofurantoin    Other reaction(s): Unknown   Penicillins    Other reaction(s): Unknown   Sulfa Antibiotics    Other reaction(s): Unknown   Azithromycin Rash      Medication List       Accurate as of 04/29/16 11:59 PM. Always use your most recent med list.          amLODipine 5 MG tablet Commonly known as:  NORVASC Take 5 mg by mouth daily.   clotrimazole 1 % vaginal cream Commonly known as:  GYNE-LOTRIMIN Place 1 application vaginally daily as needed. For yeast infection.   glimepiride 2  MG tablet Commonly known as:  AMARYL Take 2 mg by mouth daily.   guaiFENesin 100 MG/5ML Soln Commonly known as:  ROBITUSSIN Take 10 mLs (200 mg total) by mouth every 4 (four) hours as needed for cough or to loosen phlegm.   ipratropium-albuterol 0.5-2.5 (3) MG/3ML Soln Commonly known as:  DUONEB Take 3 mLs by nebulization every 4 (four) hours as needed.   NEOSPORIN ECZEMA ESSENT MAX ST 1 % Generic drug:  hydrocortisone cream Apply 1 application topically 2 (two) times daily as needed for itching (for sores.).   predniSONE 10 MG tablet Commonly known as:   DELTASONE 40mg x1 day, 20mg  x2 day, 10mg  x2 day then stop   sodium chloride 0.65 % nasal spray Commonly known as:  OCEAN Place 2 sprays into the nose daily as needed.   tiotropium 18 MCG inhalation capsule Commonly known as:  SPIRIVA Place 1 capsule (18 mcg total) into inhaler and inhale daily.   TYLENOL 8 HOUR 650 MG CR tablet Generic drug:  acetaminophen Take 650 mg by mouth every 8 (eight) hours as needed for pain.   XARELTO PO Take 1 tablet by mouth daily.       Review of Systems  Constitutional: Negative for activity change, appetite change, chills, diaphoresis and fever.  HENT: Positive for congestion. Negative for rhinorrhea, sinus pain, sinus pressure, sneezing, sore throat, trouble swallowing and voice change.   Respiratory: Negative for apnea, cough, choking, chest tightness, shortness of breath and wheezing.   Cardiovascular: Negative for chest pain and palpitations.  Gastrointestinal: Negative for abdominal distention, abdominal pain, constipation, diarrhea and nausea.  Genitourinary: Negative for difficulty urinating, dysuria, frequency and urgency.  Musculoskeletal: Positive for arthralgias (typical arthritis) and joint swelling. Negative for back pain, gait problem and myalgias.  Skin: Negative for color change, pallor, rash and wound.  Neurological: Negative for dizziness, tremors, syncope, speech difficulty, weakness, numbness and headaches.  Psychiatric/Behavioral: Negative for agitation and behavioral problems.  All other systems reviewed and are negative.   Immunization History  Administered Date(s) Administered  . Pneumococcal Polysaccharide-23 12/11/2014   Pertinent  Health Maintenance Due  Topic Date Due  . DEXA SCAN  11/17/1988  . INFLUENZA VACCINE  11/13/2015  . PNA vac Low Risk Adult (2 of 2 - PCV13) 12/11/2015   No flowsheet data found. Functional Status Survey:    Vitals:   04/29/16 0430  BP: (!) 111/46  Pulse: 62  Resp: 20  Temp: 98 F  (36.7 C)  SpO2: 93%   There is no height or weight on file to calculate BMI. Physical Exam  Constitutional: She is oriented to person, place, and time. Vital signs are normal. She appears well-developed and well-nourished. She is active and cooperative. She does not appear ill. No distress.  HENT:  Head: Normocephalic and atraumatic.  Mouth/Throat: Uvula is midline, oropharynx is clear and moist and mucous membranes are normal. Mucous membranes are not pale, not dry and not cyanotic.  Eyes: Conjunctivae, EOM and lids are normal. Pupils are equal, round, and reactive to light.  Neck: Trachea normal, normal range of motion and full passive range of motion without pain. Neck supple. No JVD present. No tracheal deviation, no edema and no erythema present. No thyromegaly present.  Cardiovascular: Normal rate, regular rhythm, normal heart sounds, intact distal pulses and normal pulses.  Exam reveals no gallop, no distant heart sounds and no friction rub.   No murmur heard. Pulmonary/Chest: Effort normal. No accessory muscle usage. No respiratory distress. She has  decreased breath sounds in the right lower field and the left lower field. She has no wheezes. She has no rhonchi. She has no rales. She exhibits no tenderness.  Abdominal: Normal appearance and bowel sounds are normal. She exhibits no distension and no ascites. There is no tenderness.  Musculoskeletal: She exhibits no edema.       Right knee: She exhibits decreased range of motion, swelling and effusion. Tenderness found.  Expected osteoarthritis, stiffness; calves soft, supple. Negative Homan's sign.   Neurological: She is alert and oriented to person, place, and time. She has normal strength.  Skin: Skin is warm, dry and intact. She is not diaphoretic. No cyanosis. No pallor. Nails show no clubbing.  Psychiatric: She has a normal mood and affect. Her speech is normal and behavior is normal. Judgment and thought content normal. Cognition  and memory are normal.  Nursing note and vitals reviewed.   Labs reviewed:  Recent Labs  04/07/16 1539 04/08/16 0519 04/09/16 0554  NA 135 136 140  K 3.3* 3.2* 4.2  CL 102 103 110  CO2 22 21* 22  GLUCOSE 121* 348* 298*  BUN 25* 34* 35*  CREATININE 1.22* 1.73* 1.30*  CALCIUM 8.6* 8.4* 8.4*  MG 1.6*  --   --     Recent Labs  04/07/16 1539  AST 35  ALT 18  ALKPHOS 62  BILITOT 2.4*  PROT 6.7  ALBUMIN 3.9    Recent Labs  04/07/16 1539 04/08/16 0519  WBC 5.0 4.5  NEUTROABS 3.1  --   HGB 12.7 12.1  HCT 36.6 35.3  MCV 92.6 94.0  PLT 163 156   No results found for: TSH No results found for: HGBA1C No results found for: CHOL, HDL, LDLCALC, LDLDIRECT, TRIG, CHOLHDL  Significant Diagnostic Results in last 30 days:  Dg Chest 2 View  Result Date: 04/07/2016 CLINICAL DATA:  Cough x4 days EXAM: CHEST  2 VIEW COMPARISON:  12/12/2014 FINDINGS: Mild bibasilar atelectasis. No focal consolidation. No pleural effusion or pneumothorax. Cardiomegaly. Left subclavian pacemaker. IMPRESSION: No evidence of acute cardiopulmonary disease. Electronically Signed   By: Julian Hy M.D.   On: 04/07/2016 15:51       Assessment/Plan 1. Knee effusion, right  Acetaminophen 650 mg po QID scheduled for pain  Tramadol 50 mg 1 tablet po Q 4 hours prn pain  Voltaren Gel 1%- 4 grams to the Right knee QID for pain/ effusion  Wrap knee with acewrap QID (d/t removal for Voltaren)- monitor for skin integrity  Ice to knee QID prn for pain, inflammation, swelling  2. COPD exacerbation (HCC)  Breo Ellipta 100-25- 1 inhalation daily for COPD  Duonebs 3 ml IH TID x 3 days, then  Q 6 hours prn  Continue Spiriva 18 mcg IH Q day  Patient is being discharged with the following home health services:  none  Patient is being discharged with the following durable medical equipment:  none  Patient has been advised to f/u with their PCP in 1-2 weeks to bring them up to date on their  rehab stay.  Social services at facility was responsible for arranging this appointment.  Pt was provided with a 30 day supply of prescriptions for medications and refills must be obtained from their PCP.  For controlled substances, a more limited supply may be provided adequate until PCP appointment only.  Family/ staff Communication:   Total Time:  Documentation:  Face to Face:  Family/Phone:   Labs/tests ordered:  2- view CXR and  complete view Right Knee Xrays  Medication list reviewed and assessed for continued appropriateness.  Vikki Ports, NP-C Geriatrics [facility] [phone], [place] [address] Cell Phone (Mon-Fri 8am-5pm):  [phone] On Call:  [phone] & follow prompts after 5pm & weekends Office Phone:  [phone] Office Fax:  [phone]

## 2016-05-05 DIAGNOSIS — I10 Essential (primary) hypertension: Secondary | ICD-10-CM | POA: Diagnosis not present

## 2016-05-05 DIAGNOSIS — E119 Type 2 diabetes mellitus without complications: Secondary | ICD-10-CM | POA: Diagnosis not present

## 2016-05-05 DIAGNOSIS — E782 Mixed hyperlipidemia: Secondary | ICD-10-CM | POA: Diagnosis not present

## 2016-05-07 ENCOUNTER — Ambulatory Visit: Payer: PPO | Admitting: General Surgery

## 2016-05-07 DIAGNOSIS — I2510 Atherosclerotic heart disease of native coronary artery without angina pectoris: Secondary | ICD-10-CM | POA: Diagnosis not present

## 2016-05-07 DIAGNOSIS — Z950 Presence of cardiac pacemaker: Secondary | ICD-10-CM | POA: Diagnosis not present

## 2016-05-07 DIAGNOSIS — Z9181 History of falling: Secondary | ICD-10-CM | POA: Diagnosis not present

## 2016-05-07 DIAGNOSIS — I482 Chronic atrial fibrillation: Secondary | ICD-10-CM | POA: Diagnosis not present

## 2016-05-07 DIAGNOSIS — Z7901 Long term (current) use of anticoagulants: Secondary | ICD-10-CM | POA: Diagnosis not present

## 2016-05-07 DIAGNOSIS — Z7984 Long term (current) use of oral hypoglycemic drugs: Secondary | ICD-10-CM | POA: Diagnosis not present

## 2016-05-07 DIAGNOSIS — I10 Essential (primary) hypertension: Secondary | ICD-10-CM | POA: Diagnosis not present

## 2016-05-07 DIAGNOSIS — E119 Type 2 diabetes mellitus without complications: Secondary | ICD-10-CM | POA: Diagnosis not present

## 2016-05-07 DIAGNOSIS — Z9981 Dependence on supplemental oxygen: Secondary | ICD-10-CM | POA: Diagnosis not present

## 2016-05-07 DIAGNOSIS — R1312 Dysphagia, oropharyngeal phase: Secondary | ICD-10-CM | POA: Diagnosis not present

## 2016-05-07 DIAGNOSIS — J449 Chronic obstructive pulmonary disease, unspecified: Secondary | ICD-10-CM | POA: Diagnosis not present

## 2016-05-11 DIAGNOSIS — G4733 Obstructive sleep apnea (adult) (pediatric): Secondary | ICD-10-CM | POA: Diagnosis not present

## 2016-05-14 DIAGNOSIS — N390 Urinary tract infection, site not specified: Secondary | ICD-10-CM | POA: Diagnosis not present

## 2016-05-20 ENCOUNTER — Encounter: Payer: Self-pay | Admitting: General Surgery

## 2016-05-20 ENCOUNTER — Ambulatory Visit (INDEPENDENT_AMBULATORY_CARE_PROVIDER_SITE_OTHER): Payer: PPO | Admitting: General Surgery

## 2016-05-20 VITALS — BP 130/72 | HR 80 | Resp 12 | Ht 60.0 in | Wt 130.0 lb

## 2016-05-20 DIAGNOSIS — IMO0002 Reserved for concepts with insufficient information to code with codable children: Secondary | ICD-10-CM

## 2016-05-20 DIAGNOSIS — C4492 Squamous cell carcinoma of skin, unspecified: Secondary | ICD-10-CM

## 2016-05-20 NOTE — Progress Notes (Signed)
Patient ID: KIARAH WIERINGA, female   DOB: [birth year], 83 y.o.   MRN: [id number]  Chief Complaint  Patient presents with  . Follow-up    HPI SARASWATI SCHEMPP is a 83 y.o. female here today for her follow up squamous cell.  She is recovering from bronchitis.  HPI  Past Medical History:  Diagnosis Date  . COPD (chronic obstructive pulmonary disease) ([place])   . Coronary artery disease   . Diabetes mellitus without complication ([place])   . Hypertension   . On home oxygen therapy    uses at night    Past Surgical History:  Procedure Laterality Date  . PACEMAKER INSERTION Left 12/12/2014   Procedure: INSERTION PACEMAKER;  Surgeon: Isaias Cowman, MD;  Location: [facility] ORS;  Service: Cardiovascular;  Laterality: Left;    Family History  Problem Relation Age of Onset  . Diabetes Brother   . Diabetes Son     Social History Social History  Substance Use Topics  . Smoking status: Never Smoker  . Smokeless tobacco: Never Used  . Alcohol use No    Allergies  Allergen Reactions  . Nitrofurantoin     Other reaction(s): Unknown  . Penicillins     Other reaction(s): Unknown  . Sulfa Antibiotics     Other reaction(s): Unknown  . Azithromycin Rash    Current Outpatient Prescriptions  Medication Sig Dispense Refill  . acetaminophen (TYLENOL 8 HOUR) 650 MG CR tablet Take 650 mg by mouth every 8 (eight) hours as needed for pain.    Marland Kitchen amLODipine (NORVASC) 5 MG tablet Take 5 mg by mouth daily.    . clotrimazole (GYNE-LOTRIMIN) 1 % vaginal cream Place 1 application vaginally daily as needed. For yeast infection.    Marland Kitchen glimepiride (AMARYL) 2 MG tablet Take 2 mg by mouth daily.    Marland Kitchen guaiFENesin (ROBITUSSIN) 100 MG/5ML SOLN Take 10 mLs (200 mg total) by mouth every 4 (four) hours as needed for cough or to loosen phlegm. 1200 mL 0  . hydrocortisone cream (NEOSPORIN ECZEMA ESSENT MAX ST) 1 % Apply 1 application topically 2 (two) times daily as needed for itching (for sores.).    Marland Kitchen  ipratropium-albuterol (DUONEB) 0.5-2.5 (3) MG/3ML SOLN Take 3 mLs by nebulization every 4 (four) hours as needed. 360 mL 0  . predniSONE (DELTASONE) 10 MG tablet 40mg x1 day, 20mg  x2 day, 10mg  x2 day then stop 10 tablet 0  . Rivaroxaban (XARELTO PO) Take 1 tablet by mouth daily.    . sodium chloride (OCEAN) 0.65 % nasal spray Place 2 sprays into the nose daily as needed.    . tiotropium (SPIRIVA) 18 MCG inhalation capsule Place 1 capsule (18 mcg total) into inhaler and inhale daily. 30 capsule 12   No current facility-administered medications for this visit.     Review of Systems Review of Systems  Constitutional: Negative.   Respiratory: Negative.  Negative for cough.   Cardiovascular: Negative.     Blood pressure 130/72, pulse 80, resp. rate 12, height 5' (1.524 m), weight 130 lb (59 kg), SpO2 95 %.  Physical Exam Physical Exam  Constitutional: She is oriented to person, place, and time. She appears well-developed and well-nourished.  HENT:  Head:    Incision well healed left neck  Lymphadenopathy:       Head (left side): No preauricular, no posterior auricular and no occipital adenopathy present.    She has no cervical adenopathy.       Left cervical: No superficial cervical and no  posterior cervical adenopathy present.  Neurological: She is alert and oriented to person, place, and time.  Skin: Skin is warm and dry.  Psychiatric: Her behavior is normal.      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    Plan         Follow up in 6 months.  This information has been scribed by Gaspar Cola CMA.   Robert Bellow 05/21/2016, 7:36 PM

## 2016-05-20 NOTE — Patient Instructions (Signed)
The patient is aware to call back for any questions or concerns.  

## 2016-05-29 DIAGNOSIS — J449 Chronic obstructive pulmonary disease, unspecified: Secondary | ICD-10-CM | POA: Diagnosis not present

## 2016-05-29 DIAGNOSIS — R0609 Other forms of dyspnea: Secondary | ICD-10-CM | POA: Diagnosis not present

## 2016-05-29 DIAGNOSIS — G4734 Idiopathic sleep related nonobstructive alveolar hypoventilation: Secondary | ICD-10-CM | POA: Diagnosis not present

## 2016-06-06 DIAGNOSIS — E119 Type 2 diabetes mellitus without complications: Secondary | ICD-10-CM | POA: Diagnosis not present

## 2016-06-06 DIAGNOSIS — N390 Urinary tract infection, site not specified: Secondary | ICD-10-CM | POA: Diagnosis not present

## 2016-06-06 DIAGNOSIS — I10 Essential (primary) hypertension: Secondary | ICD-10-CM | POA: Diagnosis not present

## 2016-06-06 DIAGNOSIS — E782 Mixed hyperlipidemia: Secondary | ICD-10-CM | POA: Diagnosis not present

## 2016-06-11 DIAGNOSIS — G4733 Obstructive sleep apnea (adult) (pediatric): Secondary | ICD-10-CM | POA: Diagnosis not present

## 2016-06-18 DIAGNOSIS — H401121 Primary open-angle glaucoma, left eye, mild stage: Secondary | ICD-10-CM | POA: Diagnosis not present

## 2016-06-30 DIAGNOSIS — E1165 Type 2 diabetes mellitus with hyperglycemia: Secondary | ICD-10-CM | POA: Diagnosis not present

## 2016-06-30 DIAGNOSIS — H8110 Benign paroxysmal vertigo, unspecified ear: Secondary | ICD-10-CM | POA: Diagnosis not present

## 2016-06-30 DIAGNOSIS — E782 Mixed hyperlipidemia: Secondary | ICD-10-CM | POA: Diagnosis not present

## 2016-06-30 DIAGNOSIS — N390 Urinary tract infection, site not specified: Secondary | ICD-10-CM | POA: Diagnosis not present

## 2016-06-30 DIAGNOSIS — E785 Hyperlipidemia, unspecified: Secondary | ICD-10-CM | POA: Diagnosis not present

## 2016-06-30 DIAGNOSIS — E1122 Type 2 diabetes mellitus with diabetic chronic kidney disease: Secondary | ICD-10-CM | POA: Diagnosis not present

## 2016-07-28 DIAGNOSIS — M170 Bilateral primary osteoarthritis of knee: Secondary | ICD-10-CM | POA: Diagnosis not present

## 2016-09-09 DIAGNOSIS — E1122 Type 2 diabetes mellitus with diabetic chronic kidney disease: Secondary | ICD-10-CM | POA: Diagnosis not present

## 2016-09-09 DIAGNOSIS — I4891 Unspecified atrial fibrillation: Secondary | ICD-10-CM | POA: Diagnosis not present

## 2016-09-09 DIAGNOSIS — I10 Essential (primary) hypertension: Secondary | ICD-10-CM | POA: Diagnosis not present

## 2016-09-09 DIAGNOSIS — E782 Mixed hyperlipidemia: Secondary | ICD-10-CM | POA: Diagnosis not present

## 2016-09-15 DIAGNOSIS — E782 Mixed hyperlipidemia: Secondary | ICD-10-CM | POA: Diagnosis not present

## 2016-09-15 DIAGNOSIS — L03116 Cellulitis of left lower limb: Secondary | ICD-10-CM | POA: Diagnosis not present

## 2016-09-15 DIAGNOSIS — E119 Type 2 diabetes mellitus without complications: Secondary | ICD-10-CM | POA: Diagnosis not present

## 2016-09-15 DIAGNOSIS — I10 Essential (primary) hypertension: Secondary | ICD-10-CM | POA: Diagnosis not present

## 2016-09-18 ENCOUNTER — Encounter: Payer: PPO | Attending: Surgery | Admitting: Surgery

## 2016-09-18 DIAGNOSIS — H9190 Unspecified hearing loss, unspecified ear: Secondary | ICD-10-CM | POA: Diagnosis not present

## 2016-09-18 DIAGNOSIS — Z7984 Long term (current) use of oral hypoglycemic drugs: Secondary | ICD-10-CM | POA: Diagnosis not present

## 2016-09-18 DIAGNOSIS — Z882 Allergy status to sulfonamides status: Secondary | ICD-10-CM | POA: Diagnosis not present

## 2016-09-18 DIAGNOSIS — Z950 Presence of cardiac pacemaker: Secondary | ICD-10-CM | POA: Diagnosis not present

## 2016-09-18 DIAGNOSIS — Z7901 Long term (current) use of anticoagulants: Secondary | ICD-10-CM | POA: Diagnosis not present

## 2016-09-18 DIAGNOSIS — S8011XA Contusion of right lower leg, initial encounter: Secondary | ICD-10-CM | POA: Diagnosis not present

## 2016-09-18 DIAGNOSIS — Z880 Allergy status to penicillin: Secondary | ICD-10-CM | POA: Diagnosis not present

## 2016-09-18 DIAGNOSIS — E11622 Type 2 diabetes mellitus with other skin ulcer: Secondary | ICD-10-CM | POA: Insufficient documentation

## 2016-09-18 DIAGNOSIS — M1990 Unspecified osteoarthritis, unspecified site: Secondary | ICD-10-CM | POA: Insufficient documentation

## 2016-09-18 DIAGNOSIS — I132 Hypertensive heart and chronic kidney disease with heart failure and with stage 5 chronic kidney disease, or end stage renal disease: Secondary | ICD-10-CM | POA: Insufficient documentation

## 2016-09-18 DIAGNOSIS — J449 Chronic obstructive pulmonary disease, unspecified: Secondary | ICD-10-CM | POA: Insufficient documentation

## 2016-09-18 DIAGNOSIS — H409 Unspecified glaucoma: Secondary | ICD-10-CM | POA: Diagnosis not present

## 2016-09-18 DIAGNOSIS — I509 Heart failure, unspecified: Secondary | ICD-10-CM | POA: Diagnosis not present

## 2016-09-18 DIAGNOSIS — I4891 Unspecified atrial fibrillation: Secondary | ICD-10-CM | POA: Insufficient documentation

## 2016-09-18 DIAGNOSIS — M70961 Unspecified soft tissue disorder related to use, overuse and pressure, right lower leg: Secondary | ICD-10-CM | POA: Diagnosis not present

## 2016-09-19 ENCOUNTER — Encounter: Payer: Self-pay | Admitting: *Deleted

## 2016-09-20 NOTE — Progress Notes (Signed)
KALEIGH, SPIEGELMAN ([id number]) Visit Report for 09/18/2016 Allergy List Details Patient Name: Sarah Dougherty, Sarah Dougherty. Date of Service: 09/18/2016 8:00 AM Medical Record Number: [id number] Patient Account Number: [id number] Date of Birth/Sex: [birth year] (83 y.o. Female) Treating RN: Montey Hora Primary Care Elliana Bal: Lamonte Sakai Other Clinician: Referring Shantea Poulton: Lamonte Sakai Treating Hason Ofarrell/Extender: Frann Rider in Treatment: 0 Allergies Active Allergies Sulfa (Sulfonamide Antibiotics) amoxicillin Macrobid Keflex Allergy Notes Electronic Signature(s) Signed: 09/18/2016 8:55:56 AM By: Montey Hora Entered By: Montey Hora on 09/18/2016 08:55:56 Sarah Dougherty ([id number]) -------------------------------------------------------------------------------- Arrival Information Details Patient Name: Sarah Dougherty. Date of Service: 09/18/2016 8:00 AM Medical Record Number: [id number] Patient Account Number: [id number] Date of Birth/Sex: October 09, 1921 (83 y.o. Female) Treating RN: Montey Hora Primary Care Asher Torpey: Lamonte Sakai Other Clinician: Referring Krisandra Bueno: Lamonte Sakai Treating Jacquel Redditt/Extender: Frann Rider in Treatment: 0 Visit Information Patient Arrived: Cane Arrival Time: 08:09 Accompanied By: self Transfer Assistance: None Patient Identification Verified: Yes Secondary Verification Process Yes Completed: Patient Has Alerts: Yes Patient Alerts: Patient on Blood Thinner Eliquis DMII ABI [place] >220 Electronic Signature(s) Signed: 09/18/2016 4:27:13 PM By: Montey Hora Entered By: Montey Hora on 09/18/2016 08:23:18 Sarah Dougherty ([id number]) -------------------------------------------------------------------------------- Clinic Level of Care Assessment Details Patient Name: Sarah Dougherty. Date of Service: 09/18/2016 8:00 AM Medical Record Number: [id number] Patient Account Number: [id number] Date of Birth/Sex: 1921-07-22 (83 y.o.  Female) Treating RN: Montey Hora Primary Care Wah Sabic: Lamonte Sakai Other Clinician: Referring Meshia Rau: Lamonte Sakai Treating Candler Ginsberg/Extender: Frann Rider in Treatment: 0 Clinic Level of Care Assessment Items TOOL 2 Quantity Score []  - Use when only an EandM is performed on the INITIAL visit 0 ASSESSMENTS - Nursing Assessment / Reassessment X - General Physical Exam (combine w/ comprehensive assessment (listed just 1 20 below) when performed on new pt. evals) X - Comprehensive Assessment (HX, ROS, Risk Assessments, Wounds Hx, etc.) 1 25 ASSESSMENTS - Wound and Skin Assessment / Reassessment []  - Simple Wound Assessment / Reassessment - one wound 0 []  - Complex Wound Assessment / Reassessment - multiple wounds 0 X - Dermatologic / Skin Assessment (not related to wound area) 1 10 ASSESSMENTS - Ostomy and/or Continence Assessment and Care []  - Incontinence Assessment and Management 0 []  - Ostomy Care Assessment and Management (repouching, etc.) 0 PROCESS - Coordination of Care X - Simple Patient / Family Education for ongoing care 1 15 []  - Complex (extensive) Patient / Family Education for ongoing care 0 []  - Staff obtains Programmer, systems, Records, Test Results / Process Orders 0 []  - Staff telephones HHA, Nursing Homes / Clarify orders / etc 0 []  - Routine Transfer to another Facility (non-emergent condition) 0 []  - Routine Hospital Admission (non-emergent condition) 0 X - New Admissions / Biomedical engineer / Ordering NPWT, Apligraf, etc. 1 15 []  - Emergency Hospital Admission (emergent condition) 0 X - Simple Discharge Coordination 1 10 Sarah Dougherty, Sarah L. ([id number]) []  - Complex (extensive) Discharge Coordination 0 PROCESS - Special Needs []  - Pediatric / Minor Patient Management 0 []  - Isolation Patient Management 0 []  - Hearing / Language / Visual special needs 0 []  - Assessment of Community assistance (transportation, D/C planning, etc.) 0 []  - Additional  assistance / Altered mentation 0 []  - Support Surface(s) Assessment (bed, cushion, seat, etc.) 0 INTERVENTIONS - Wound Cleansing / Measurement []  - Wound Imaging (photographs - any number of wounds) 0 []  - Wound Tracing (instead of photographs) 0 []  - Simple Wound Measurement - one wound 0 []  - Complex Wound Measurement -  multiple wounds 0 []  - Simple Wound Cleansing - one wound 0 []  - Complex Wound Cleansing - multiple wounds 0 INTERVENTIONS - Wound Dressings []  - Small Wound Dressing one or multiple wounds 0 []  - Medium Wound Dressing one or multiple wounds 0 []  - Large Wound Dressing one or multiple wounds 0 []  - Application of Medications - injection 0 INTERVENTIONS - Miscellaneous []  - External ear exam 0 []  - Specimen Collection (cultures, biopsies, blood, body fluids, etc.) 0 []  - Specimen(s) / Culture(s) sent or taken to Lab for analysis 0 []  - Patient Transfer (multiple staff / Harrel Lemon Lift / Similar devices) 0 []  - Simple Staple / Suture removal (25 or less) 0 []  - Complex Staple / Suture removal (26 or more) 0 Sarah Dougherty, Sarah L. ([id number]) []  - Hypo / Hyperglycemic Management (close monitor of Blood Glucose) 0 X - Ankle / Brachial Index (ABI) - do not check if billed separately 1 15 Has the patient been seen at the hospital within the last three years: Yes Total Score: 110 Level Of Care: New/Established - Level 3 Electronic Signature(s) Signed: 09/18/2016 4:27:13 PM By: Montey Hora Entered By: Montey Hora on 09/18/2016 08:53:30 Sarah Dougherty ([id number]) -------------------------------------------------------------------------------- Encounter Discharge Information Details Patient Name: Sarah Dougherty. Date of Service: 09/18/2016 8:00 AM Medical Record Number: [id number] Patient Account Number: [id number] Date of Birth/Sex: [birth year] (83 y.o. Female) Treating RN: Montey Hora Primary Care Azariya Freeman: Lamonte Sakai Other Clinician: Referring Saaya Procell: Lamonte Sakai Treating Jadier Rockers/Extender: Frann Rider in Treatment: 0 Encounter Discharge Information Items Discharge Pain Level: 0 Discharge Condition: Stable Ambulatory Status: Cane Discharge Destination: Home Private Transportation: Auto Accompanied By: son Schedule Follow-up Appointment: Yes Medication Reconciliation completed and No provided to Patient/Care Adianna Darwin: Clinical Summary of Care: Electronic Signature(s) Signed: 09/18/2016 8:54:21 AM By: Montey Hora Entered By: Montey Hora on 09/18/2016 08:54:21 Sarah Dougherty ([id number]) -------------------------------------------------------------------------------- Lower Extremity Assessment Details Patient Name: Sarah Dougherty. Date of Service: 09/18/2016 8:00 AM Medical Record Number: [id number] Patient Account Number: [id number] Date of Birth/Sex: 03-09-1922 (83 y.o. Female) Treating RN: Montey Hora Primary Care Deasia Chiu: Lamonte Sakai Other Clinician: Referring Josslynn Mentzer: Lamonte Sakai Treating Tamirah George/Extender: Frann Rider in Treatment: 0 Edema Assessment Assessed: [Left: No] [Right: No] Edema: [Left: N] [Right: o] Calf Left: Right: Point of Measurement: 32 cm From Medial Instep cm 27.6 cm Ankle Left: Right: Point of Measurement: 9 cm From Medial Instep cm 19 cm Vascular Assessment Pulses: Dorsalis Pedis Palpable: [Right:No] Doppler Audible: [Right:Yes] Posterior Tibial Palpable: [Right:No] Doppler Audible: [Right:Yes] Extremity colors, hair growth, and conditions: Extremity Color: [Right:Hyperpigmented] Hair Growth on Extremity: [Right:No] Temperature of Extremity: [Right:Warm] Capillary Refill: [Right:< 3 seconds] Toe Nail Assessment Left: Right: Thick: Yes Discolored: No Deformed: Yes Improper Length and Hygiene: Yes Notes ABI [place] >220 Sarah Dougherty, Sarah Dougherty ([id number]) Electronic Signature(s) Signed: 09/18/2016 4:27:13 PM By: Montey Hora Entered By: Montey Hora on 09/18/2016  08:23:03 Sarah Dougherty ([id number]) -------------------------------------------------------------------------------- Multi Wound Chart Details Patient Name: Sarah Axe L. Date of Service: 09/18/2016 8:00 AM Medical Record Number: [id number] Patient Account Number: [id number] Date of Birth/Sex: March 22, 1922 (83 y.o. Female) Treating RN: Montey Hora Primary Care Don Giarrusso: Lamonte Sakai Other Clinician: Referring Merna Baldi: Lamonte Sakai Treating Konstance Happel/Extender: Frann Rider in Treatment: 0 Vital Signs Height(in): 61 Pulse(bpm): 85 Weight(lbs): 117 Blood Pressure 152/80 (mmHg): Body Mass Index(BMI): 22 Temperature(F): 97.8 Respiratory Rate 20 (breaths/min): Wound Assessments Treatment Notes Electronic Signature(s) Signed: 09/18/2016 8:52:05 AM By: Montey Hora Previous Signature: 09/18/2016 8:45:10 AM Version By: Christin Fudge MD,  FACS Entered By: Montey Hora on 09/18/2016 08:52:04 Sarah Dougherty ([id number]) -------------------------------------------------------------------------------- [place] Details Patient Name: Sarah Dougherty. Date of Service: 09/18/2016 8:00 AM Medical Record Number: [id number] Patient Account Number: [id number] Date of Birth/Sex: July 18, 1921 (83 y.o. Female) Treating RN: Montey Hora Primary Care Marijke Guadiana: Lamonte Sakai Other Clinician: Referring Dorian Renfro: Lamonte Sakai Treating Malcolm Quast/Extender: Frann Rider in Treatment: 0 Active Inactive ` Abuse / Safety / Falls / Self Care Management Nursing Diagnoses: Potential for falls Goals: Patient will remain injury free related to falls Date Initiated: 09/18/2016 Target Resolution Date: 12/05/2016 Goal Status: Active Interventions: Assess fall risk on admission and as needed Notes: ` Orientation to the Wound Care Program Nursing Diagnoses: Knowledge deficit related to the wound healing center program Goals: Patient/caregiver will verbalize  understanding of the [place] Date Initiated: 09/18/2016 Target Resolution Date: 12/05/2016 Goal Status: Active Interventions: Provide education on orientation to the wound center Notes: Electronic Signature(s) Signed: 09/18/2016 8:51:29 AM By: Montey Hora Entered By: Montey Hora on 09/18/2016 08:51:29 Sarah Dougherty ([id number]) -------------------------------------------------------------------------------- Non-Wound Condition Assessment Details Patient Name: Sarah Dougherty. Date of Service: 09/18/2016 8:00 AM Medical Record Number: [id number] Patient Account Number: [id number] Date of Birth/Sex: 09/14/1921 (83 y.o. Female) Treating RN: Montey Hora Primary Care Jumaane Weatherford: Lamonte Sakai Other Clinician: Referring Torey Regan: Lamonte Sakai Treating Kerstie Agent/Extender: Frann Rider in Treatment: 0 Non-Wound Condition: Condition: Other Dermatologic Condition Location: Leg Side: Right Notes: small hematoma Photos Periwound Skin Texture Texture Color No Abnormalities Noted: No No Abnormalities Noted: No Callus: No Atrophie Blanche: No Crepitus: No Cyanosis: No Excoriation: No Ecchymosis: No Friable: No Erythema: No Induration: No Hemosiderin Staining: No Rash: No Mottled: No Scarring: No Pallor: No Rubor: No Moisture No Abnormalities Noted: No Temperature / Pain Dry / Scaly: No Temperature: No Abnormality Maceration: No Notes small hematoma surrounded by large bruise - 1.6x1.4 with bruised area measuring 17.5x11.4 Electronic Signature(s) Signed: 09/18/2016 11:01:06 AM By: Montey Hora Entered By: Montey Hora on 09/18/2016 11:01:05 Sarah Dougherty ([id number] Dougherty, Sarah Dougherty ([id number]) -------------------------------------------------------------------------------- Pain Assessment Details Patient Name: Sarah Axe L. Date of Service: 09/18/2016 8:00 AM Medical Record Number: [id number] Patient Account Number:  [id number] Date of Birth/Sex: [birth year] (83 y.o. Female) Treating RN: Montey Hora Primary Care Cleatus Gabriel: Lamonte Sakai Other Clinician: Referring Shamiyah Ngu: Lamonte Sakai Treating Bodee Lafoe/Extender: Frann Rider in Treatment: 0 Active Problems Location of Pain Severity and Description of Pain Patient Has Paino No Site Locations Pain Management and Medication Current Pain Management: Notes Topical or injectable lidocaine is offered to patient for acute pain when surgical debridement is performed. If needed, Patient is instructed to use over the counter pain medication for the following 24-48 hours after debridement. Wound care MDs do not prescribed pain medications. Patient has chronic pain or uncontrolled pain. Patient has been instructed to make an appointment with their Primary Care Physician for pain management. Electronic Signature(s) Signed: 09/18/2016 4:27:13 PM By: Montey Hora Entered By: Montey Hora on 09/18/2016 08:10:27 Sarah Dougherty ([id number]) -------------------------------------------------------------------------------- Patient/Caregiver Education Details Patient Name: Sarah Dougherty Date of Service: 09/18/2016 8:00 AM Medical Record Number: [id number] Patient Account Number: [id number] Date of Birth/Gender: 05-Nov-1921 (83 y.o. Female) Treating RN: Montey Hora Primary Care Physician: Lamonte Sakai Other Clinician: Referring Physician: Lamonte Sakai Treating Physician/Extender: Frann Rider in Treatment: 0 Education Assessment Education Provided To: Patient and Caregiver Education Topics Provided Wound/Skin Impairment: Handouts: Other: leg elevation and keep bandage on hematoma Methods: Explain/Verbal Responses: State content correctly Electronic Signature(s) Signed: 09/18/2016 4:27:13  PM By: Montey Hora Entered By: Montey Hora on 09/18/2016 08:55:03 Sarah Dougherty  ([id number]) -------------------------------------------------------------------------------- [place] Details Patient Name: Sarah Dougherty. Date of Service: 09/18/2016 8:00 AM Medical Record Number: [id number] Patient Account Number: [id number] Date of Birth/Sex: 01/06/1922 (83 y.o. Female) Treating RN: Montey Hora Primary Care Willadene Mounsey: Lamonte Sakai Other Clinician: Referring Tasmin Exantus: Lamonte Sakai Treating Shant Hence/Extender: Frann Rider in Treatment: 0 Vital Signs Time Taken: 08:10 Temperature (F): 97.8 Height (in): 61 Pulse (bpm): 85 Source: Measured Respiratory Rate (breaths/min): 20 Weight (lbs): 117 Blood Pressure (mmHg): 152/80 Source: Measured Reference Range: 80 - 120 mg / dl Body Mass Index (BMI): 22.1 Electronic Signature(s) Signed: 09/18/2016 4:27:13 PM By: Montey Hora Entered By: Montey Hora on 09/18/2016 08:10:59

## 2016-09-20 NOTE — Progress Notes (Signed)
VERENIS, NICOSIA ([id number]) Visit Report for 09/18/2016 Abuse/Suicide Risk Screen Details Patient Name: Sarah Dougherty, Sarah Dougherty. Date of Service: 09/18/2016 8:00 AM Medical Record Number: [id number] Patient Account Number: [id number] Date of Birth/Sex: 1921/11/27 (83 y.o. Female) Treating RN: Montey Hora Primary Care Jayme Mednick: Lamonte Sakai Other Clinician: Referring Ilan Kahrs: Lamonte Sakai Treating Noha Milberger/Extender: Frann Rider in Treatment: 0 Abuse/Suicide Risk Screen Items Answer ABUSE/SUICIDE RISK SCREEN: Has anyone close to you tried to hurt or harm you recentlyo No Do you feel uncomfortable with anyone in your familyo No Has anyone forced you do things that you didnot want to doo No Do you have any thoughts of harming yourselfo No Patient displays signs or symptoms of abuse and/or neglect. No Electronic Signature(s) Signed: 09/18/2016 4:27:13 PM By: Montey Hora Entered By: Montey Hora on 09/18/2016 08:11:48 Sarah Dougherty ([id number]) -------------------------------------------------------------------------------- Activities of Daily Living Details Patient Name: Sarah Dougherty, Sarah L. Date of Service: 09/18/2016 8:00 AM Medical Record Number: [id number] Patient Account Number: [id number] Date of Birth/Sex: Mar 10, 1922 (83 y.o. Female) Treating RN: Montey Hora Primary Care Arhianna Ebey: Lamonte Sakai Other Clinician: Referring Eileen Kangas: Lamonte Sakai Treating Promyse Ardito/Extender: Frann Rider in Treatment: 0 Activities of Daily Living Items Answer Activities of Daily Living (Please select one for each item) Drive Automobile Not Able Take Medications Completely Able Use Telephone Completely Able Care for Appearance Completely Able Use Toilet Completely Able Bath / Shower Completely Able Dress Self Completely Able Feed Self Completely Able Walk Completely Able Get In / Out Bed Completely Able Housework Completely Foster for Self Not Able Electronic Signature(s) Signed: 09/18/2016 4:27:13 PM By: Montey Hora Entered By: Montey Hora on 09/18/2016 08:12:26 Sarah Dougherty ([id number]) -------------------------------------------------------------------------------- Education Assessment Details Patient Name: Sarah Dougherty. Date of Service: 09/18/2016 8:00 AM Medical Record Number: [id number] Patient Account Number: [id number] Date of Birth/Sex: [birth year] (83 y.o. Female) Treating RN: Montey Hora Primary Care Karolyn Messing: Lamonte Sakai Other Clinician: Referring Gurnie Duris: Lamonte Sakai Treating Marek Nghiem/Extender: Frann Rider in Treatment: 0 Primary Learner Assessed: Caregiver Reason Patient is not Primary Learner: wound location Learning Preferences/Education Level/Primary Language Learning Preference: Explanation, Demonstration Highest Education Level: High School Preferred Language: English Cognitive Barrier Assessment/Beliefs Language Barrier: No Translator Needed: No Memory Deficit: No Emotional Barrier: No Cultural/Religious Beliefs Affecting Medical No Care: Physical Barrier Assessment Impaired Vision: No Impaired Hearing: No Decreased Hand dexterity: No Knowledge/Comprehension Assessment Knowledge Level: Medium Comprehension Level: Medium Ability to understand written Medium instructions: Ability to understand verbal Medium instructions: Motivation Assessment Anxiety Level: Calm Cooperation: Cooperative Education Importance: Acknowledges Need Interest in Health Problems: Asks Questions Perception: Coherent Willingness to Engage in Self- Medium Management Activities: Readiness to Engage in Self- Medium Management Activities: Sarah Dougherty, Sarah Dougherty ([id number]) Electronic Signature(s) Signed: 09/18/2016 4:27:13 PM By: Montey Hora Entered By: Montey Hora on 09/18/2016 08:14:19 Sarah Dougherty  ([id number]) -------------------------------------------------------------------------------- Fall Risk Assessment Details Patient Name: Sarah Dougherty. Date of Service: 09/18/2016 8:00 AM Medical Record Number: [id number] Patient Account Number: [id number] Date of Birth/Sex: Nov 13, 1921 (83 y.o. Female) Treating RN: Montey Hora Primary Care Charisse Wendell: Lamonte Sakai Other Clinician: Referring Cosandra Plouffe: Lamonte Sakai Treating Dorell Gatlin/Extender: Frann Rider in Treatment: 0 Fall Risk Assessment Items Have you had 2 or more falls in the last 12 monthso 0 No Have you had any fall that resulted in injury in the last 12 monthso 0 No FALL RISK ASSESSMENT: History of falling - immediate or within 3 months 0 No Secondary diagnosis 0 No Ambulatory aid None/bed  rest/wheelchair/nurse 0 No Crutches/cane/walker 15 Yes Furniture 0 No IV Access/Saline Lock 0 No Gait/Training Normal/bed rest/immobile 0 No Weak 10 Yes Impaired 0 No Mental Status Oriented to own ability 0 Yes Electronic Signature(s) Signed: 09/18/2016 4:27:13 PM By: Montey Hora Entered By: Montey Hora on 09/18/2016 08:15:15 Sarah Dougherty ([id number]) -------------------------------------------------------------------------------- Nutrition Risk Assessment Details Patient Name: Sarah Axe L. Date of Service: 09/18/2016 8:00 AM Medical Record Number: [id number] Patient Account Number: [id number] Date of Birth/Sex: Jan 01, 1922 (83 y.o. Female) Treating RN: Montey Hora Primary Care Nikole Swartzentruber: Lamonte Sakai Other Clinician: Referring Asuncion Tapscott: Lamonte Sakai Treating Joseangel Nettleton/Extender: Frann Rider in Treatment: 0 Height (in): 61 Weight (lbs): 117 Body Mass Index (BMI): 22.1 Nutrition Risk Assessment Items NUTRITION RISK SCREEN: I have an illness or condition that made me change the kind and/or 0 No amount of food I eat I eat fewer than two meals per day 0 No I eat few fruits and vegetables, or milk  products 0 No I have three or more drinks of beer, liquor or wine almost every day 0 No I have tooth or mouth problems that make it hard for me to eat 0 No I don't always have enough money to buy the food I need 0 No I eat alone most of the time 0 No I take three or more different prescribed or over-the-counter drugs a 1 Yes day Without wanting to, I have lost or gained 10 pounds in the last six 0 No months I am not always physically able to shop, cook and/or feed myself 0 No Nutrition Protocols Good Risk Protocol 0 No interventions needed Moderate Risk Protocol Electronic Signature(s) Signed: 09/18/2016 4:27:13 PM By: Montey Hora Entered By: Montey Hora on 09/18/2016 58:25:18

## 2016-09-20 NOTE — Progress Notes (Signed)
Sarah Dougherty, Sarah Dougherty ([id number]) Visit Report for 09/18/2016 Chief Complaint Document Details Patient Name: Sarah Dougherty, Sarah Dougherty. Date of Service: 09/18/2016 8:00 AM Medical Record Number: [id number] Patient Account Number: [id number] Date of Birth/Sex: [birth year] (83 y.o. Female) Treating RN: Montey Hora Primary Care Provider: Lamonte Sakai Other Clinician: Referring Provider: Lamonte Sakai Treating Provider/Extender: Frann Rider in Treatment: 0 Information Obtained from: Patient Chief Complaint Patient presents to the wound care center due with non-wound condition(s) due to her hematoma to her right shin which she's had for 5 days Electronic Signature(s) Signed: 09/18/2016 8:45:35 AM By: Christin Fudge MD, FACS Entered By: Christin Fudge on 09/18/2016 08:45:35 Sarah Dougherty ([id number]) -------------------------------------------------------------------------------- HPI Details Patient Name: Sarah Dougherty. Date of Service: 09/18/2016 8:00 AM Medical Record Number: [id number] Patient Account Number: [id number] Date of Birth/Sex: [birth year] (83 y.o. Female) Treating RN: Montey Hora Primary Care Provider: Lamonte Sakai Other Clinician: Referring Provider: Lamonte Sakai Treating Provider/Extender: Frann Rider in Treatment: 0 History of Present Illness Location: right shin Quality: Patient reports experiencing a dull pain to affected area(s). Severity: Patient states wound (s) are getting better. Duration: Patient has had the wound for < 5 days prior to presenting for treatment Timing: Pain in wound is Intermittent (comes and goes Context: The wound occurred when the patient had a blunt injury against a bedpost Modifying Factors: Other treatment(s) tried include:she has been put on doxycycline Associated Signs and Symptoms: Patient reports having increase swelling. HPI Description: 83 year old patient sent to our clinic by Dr. Lamonte Sakai who saw her on 09/15/2016 for a  hematoma on the right shin of about 5 days' duration. the patient injured it on a bedpost and has had ecchymosis and a hematoma. She is on a Eliquis. past medical history significant for diabetes mellitus asthma atrial fibrillation deafness and presence of cardiac pacemaker. Electronic Signature(s) Signed: 09/18/2016 8:45:41 AM By: Christin Fudge MD, FACS Previous Signature: 09/18/2016 8:43:51 AM Version By: Christin Fudge MD, FACS Entered By: Christin Fudge on 09/18/2016 08:45:41 Sarah Dougherty ([id number]) -------------------------------------------------------------------------------- Physical Exam Details Patient Name: Sarah Axe L. Date of Service: 09/18/2016 8:00 AM Medical Record Number: [id number] Patient Account Number: [id number] Date of Birth/Sex: [birth year] (83 y.o. Female) Treating RN: Montey Hora Primary Care Provider: Lamonte Sakai Other Clinician: Referring Provider: Lamonte Sakai Treating Provider/Extender: Frann Rider in Treatment: 0 Constitutional . Pulse regular. Respirations normal and unlabored. Afebrile. . Eyes Nonicteric. Reactive to light. Ears, Nose, Mouth, and Throat Lips, teeth, and gums WNL.Marland Kitchen Moist mucosa without lesions. Neck supple and nontender. No palpable supraclavicular or cervical adenopathy. Normal sized without goiter. Respiratory WNL. No retractions.. Breath sounds WNL, No rubs, rales, rhonchi, or wheeze.. Cardiovascular . Pedal Pulses WNL. ABI was noncompressible bilaterally. No clubbing, cyanosis or edema.. Chest Breasts symmetical and no nipple discharge.. Breast tissue WNL, no masses, lumps, or tenderness.. Gastrointestinal (GI) Abdomen without masses or tenderness.. No liver or spleen enlargement or tenderness.. Lymphatic No adneopathy. No adenopathy. No adenopathy. Musculoskeletal Adexa without tenderness or enlargement.. Digits and nails w/o clubbing, cyanosis, infection, petechiae, ischemia, or inflammatory  conditions.. Integumentary (Hair, Skin) No suspicious lesions. No crepitus or fluctuance. No peri-wound warmth or erythema. No masses.Marland Kitchen Psychiatric Judgement and insight Intact.. No evidence of depression, anxiety, or agitation.. Notes the right lower extremity in the mid shin area she has got a small intact hematoma about 2 cm in diameter with no break of skin. There is surrounding ecchymosis. Electronic Signature(s) Signed: 09/18/2016 8:46:25 AM By: Christin Fudge MD, FACS Entered By: Christin Fudge  on 09/18/2016 08:46:25 Sarah Dougherty, Sarah Dougherty ([id number]) -------------------------------------------------------------------------------- Physician Orders Details Patient Name: Sarah Dougherty, Sarah Dougherty. Date of Service: 09/18/2016 8:00 AM Medical Record Number: [id number] Patient Account Number: [id number] Date of Birth/Sex: [birth year] (83 y.o. Female) Treating RN: Montey Hora Primary Care Provider: Lamonte Sakai Other Clinician: Referring Provider: Lamonte Sakai Treating Provider/Extender: Frann Rider in Treatment: 0 Verbal / Phone Orders: No Diagnosis Coding Wound Cleansing o May Shower, gently pat wound dry prior to applying new dressing. Secondary Dressing o Boardered Foam Dressing Dressing Change Frequency o Other: - as needed Follow-up Appointments o Return Appointment in 1 week. Edema Control o Elevate legs to the level of the heart and pump ankles as often as possible Electronic Signature(s) Signed: 09/18/2016 3:31:51 PM By: Christin Fudge MD, FACS Signed: 09/18/2016 4:27:13 PM By: Montey Hora Entered By: Montey Hora on 09/18/2016 08:43:20 Sarah Dougherty ([id number]) -------------------------------------------------------------------------------- Problem List Details Patient Name: Sarah Dougherty, Sarah L. Date of Service: 09/18/2016 8:00 AM Medical Record Number: [id number] Patient Account Number: [id number] Date of Birth/Sex: 1922/03/27 (83 y.o. Female) Treating RN:  Montey Hora Primary Care Provider: Lamonte Sakai Other Clinician: Referring Provider: Lamonte Sakai Treating Provider/Extender: Frann Rider in Treatment: 0 Active Problems ICD-10 Encounter Code Description Active Date Diagnosis E11.622 Type 2 diabetes mellitus with other skin ulcer 09/18/2016 Yes M70.961 Unspecified soft tissue disorder related to use, overuse 09/18/2016 Yes and pressure, right lower leg Z79.01 Long term (current) use of anticoagulants 09/18/2016 Yes Inactive Problems Resolved Problems Electronic Signature(s) Signed: 09/18/2016 8:45:01 AM By: Christin Fudge MD, FACS Entered By: Christin Fudge on 09/18/2016 08:45:01 Sarah Dougherty ([id number]) -------------------------------------------------------------------------------- Progress Note Details Patient Name: Sarah Dougherty. Date of Service: 09/18/2016 8:00 AM Medical Record Number: [id number] Patient Account Number: [id number] Date of Birth/Sex: 1921-10-15 (83 y.o. Female) Treating RN: Montey Hora Primary Care Provider: Lamonte Sakai Other Clinician: Referring Provider: Lamonte Sakai Treating Provider/Extender: Frann Rider in Treatment: 0 Subjective Chief Complaint Information obtained from Patient Patient presents to the wound care center due with non-wound condition(s) due to her hematoma to her right shin which she's had for 5 days History of Present Illness (HPI) The following HPI elements were documented for the patient's wound: Location: right shin Quality: Patient reports experiencing a dull pain to affected area(s). Severity: Patient states wound (s) are getting better. Duration: Patient has had the wound for < 5 days prior to presenting for treatment Timing: Pain in wound is Intermittent (comes and goes Context: The wound occurred when the patient had a blunt injury against a bedpost Modifying Factors: Other treatment(s) tried include:she has been put on doxycycline Associated Signs and  Symptoms: Patient reports having increase swelling. 83 year old patient sent to our clinic by Dr. Lamonte Sakai who saw her on 09/15/2016 for a hematoma on the right shin of about 5 days' duration. the patient injured it on a bedpost and has had ecchymosis and a hematoma. She is on a Eliquis. past medical history significant for diabetes mellitus asthma atrial fibrillation deafness and presence of cardiac pacemaker. Wound History Patient reportedly has not tested positive for osteomyelitis. Patient reportedly has not had testing performed to evaluate circulation in the legs. Patient History Information obtained from Patient. Allergies Sulfa (Sulfonamide Antibiotics), amoxicillin, Macrobid, Keflex Social History Never smoker, Marital Status - Widowed, Alcohol Use - Never, Drug Use - No History, Caffeine Use - Moderate. MALEYA, LEEVER ([id number]) Medical History Eyes Patient has history of Cataracts - left eye, Glaucoma - left eye Denies history of Optic Neuritis Ear/Nose/Mouth/Throat Denies  history of Chronic sinus problems/congestion, Middle ear problems Respiratory Patient has history of Asthma, Chronic Obstructive Pulmonary Disease (COPD) Denies history of Aspiration, Pneumothorax, Sleep Apnea, Tuberculosis Cardiovascular Patient has history of Arrhythmia - hx a fib, Congestive Heart Failure, Hypertension Denies history of Angina, Coronary Artery Disease, Deep Vein Thrombosis, Hypotension, Myocardial Infarction, Peripheral Arterial Disease, Peripheral Venous Disease, Phlebitis, Vasculitis Gastrointestinal Denies history of Cirrhosis , Colitis, Crohn s, Hepatitis A, Hepatitis B, Hepatitis C Endocrine Patient has history of Type II Diabetes Denies history of Type I Diabetes Genitourinary Denies history of End Stage Renal Disease Immunological Denies history of Lupus Erythematosus, Raynaud s, Scleroderma Integumentary (Skin) Denies history of History of Burn, History of  pressure wounds Musculoskeletal Patient has history of Osteoarthritis Denies history of Gout, Rheumatoid Arthritis, Osteomyelitis Neurologic Denies history of Dementia, Neuropathy, Quadriplegia, Paraplegia, Seizure Disorder Oncologic Denies history of Received Chemotherapy, Received Radiation Psychiatric Denies history of Anorexia/bulimia, Confinement Anxiety Patient is treated with Oral Agents. Medical And Surgical History Notes Eyes right eye artificial Cardiovascular pacemaker, long qt syndrome Review of Systems (ROS) Constitutional Symptoms (General Health) The patient has no complaints or symptoms. Eyes Complains or has symptoms of Glasses / Contacts - glasses. Denies complaints or symptoms of Dry Eyes, Vision Changes. Ear/Nose/Mouth/Throat The patient has no complaints or symptoms. Hematologic/Lymphatic Sarah Dougherty, Sarah Dougherty ([id number]) The patient has no complaints or symptoms. Respiratory Complains or has symptoms of Shortness of Breath. Cardiovascular Complains or has symptoms of LE edema. Denies complaints or symptoms of Chest pain. Gastrointestinal The patient has no complaints or symptoms. Endocrine The patient has no complaints or symptoms. Genitourinary The patient has no complaints or symptoms. Immunological Denies complaints or symptoms of Hives, Itching. Integumentary (Skin) The patient has no complaints or symptoms. Musculoskeletal The patient has no complaints or symptoms. Neurologic The patient has no complaints or symptoms. Oncologic The patient has no complaints or symptoms. Psychiatric The patient has no complaints or symptoms. Objective Constitutional Pulse regular. Respirations normal and unlabored. Afebrile. Vitals Time Taken: 8:10 AM, Height: 61 in, Source: Measured, Weight: 117 lbs, Source: Measured, BMI: 22.1, Temperature: 97.8 F, Pulse: 85 bpm, Respiratory Rate: 20 breaths/min, Blood Pressure: 152/80 mmHg. Eyes Nonicteric. Reactive  to light. Ears, Nose, Mouth, and Throat Lips, teeth, and gums WNL.Marland Kitchen Moist mucosa without lesions. Neck supple and nontender. No palpable supraclavicular or cervical adenopathy. Normal sized without goiter. Respiratory Sarah Dougherty, Sarah L. ([id number]) WNL. No retractions.. Breath sounds WNL, No rubs, rales, rhonchi, or wheeze.. Cardiovascular Pedal Pulses WNL. ABI was noncompressible bilaterally. No clubbing, cyanosis or edema.. Chest Breasts symmetical and no nipple discharge.. Breast tissue WNL, no masses, lumps, or tenderness.. Gastrointestinal (GI) Abdomen without masses or tenderness.. No liver or spleen enlargement or tenderness.. Lymphatic No adneopathy. No adenopathy. No adenopathy. Musculoskeletal Adexa without tenderness or enlargement.. Digits and nails w/o clubbing, cyanosis, infection, petechiae, ischemia, or inflammatory conditions.Marland Kitchen Psychiatric Judgement and insight Intact.. No evidence of depression, anxiety, or agitation.. General Notes: the right lower extremity in the mid shin area she has got a small intact hematoma about 2 cm in diameter with no break of skin. There is surrounding ecchymosis. Integumentary (Hair, Skin) No suspicious lesions. No crepitus or fluctuance. No peri-wound warmth or erythema. No masses.. Other Condition(s) Patient presents with Other Dermatologic Condition located on the Right Leg. The skin appearance did not exhibit: Atrophie Blanche, Callus, Crepitus, Cyanosis, Dry/Scaly, Ecchymosis, Erythema, Excoriation, Friable, Hemosiderin Staining, Induration, Maceration, Mottled, Pallor, Rash, Rubor, Scarring. Skin temperature was noted as No Abnormality. General Notes: small hematoma surrounded  by large bruise - 1.6x1.4 with bruised area measuring 17.5x11.4 Assessment Active Problems ICD-10 E11.622 - Type 2 diabetes mellitus with other skin ulcer M70.961 - Unspecified soft tissue disorder related to use, overuse and pressure, right lower  leg Z79.01 - Long term (current) use of anticoagulants Sarah Dougherty, Sarah Dougherty ([id number]) 83 year old with controlled diabetes and on long-term anticoagulation because of atrial fibrillation had a blunt injury to the right lower extremity which is a subcutaneous hematoma with no breakage of skin. there is no cellulitis of the leg I have discussed management with her son who was at the bedside and have recommended protecting the wound with a bordered foam and observing this carefully. She will return to see me next week and we will make appropriate dressing changes if and when required. Plan Wound Cleansing: May Shower, gently pat wound dry prior to applying new dressing. Secondary Dressing: Boardered Foam Dressing Dressing Change Frequency: Other: - as needed Follow-up Appointments: Return Appointment in 1 week. Edema Control: Elevate legs to the level of the heart and pump ankles as often as possible 83 year old with controlled diabetes and on long-term anticoagulation because of atrial fibrillation had a blunt injury to the right lower extremity which is a subcutaneous hematoma with no breakage of skin. there is no cellulitis of the leg I have discussed management with her son who was at the bedside and have recommended protecting the wound with a bordered foam and observing this carefully. She will return to see me next week and we will make appropriate dressing changes if and when required. Electronic Signature(s) Signed: 09/18/2016 12:52:05 PM By: Christin Fudge MD, FACS Previous Signature: 09/18/2016 8:48:49 AM Version By: Christin Fudge MD, FACS Entered By: Christin Fudge on 09/18/2016 12:52:05 Sarah Dougherty ([id number]) -------------------------------------------------------------------------------- ROS/PFSH Details Patient Name: Sarah Dougherty. Date of Service: 09/18/2016 8:00 AM Medical Record Number: [id number] Patient Account Number: [id number] Date of Birth/Sex: 1921/06/04 (83  y.o. Female) Treating RN: Montey Hora Primary Care Provider: Lamonte Sakai Other Clinician: Referring Provider: Lamonte Sakai Treating Provider/Extender: Frann Rider in Treatment: 0 Information Obtained From Patient Wound History Do you currently have one or more open woundso No Have you tested positive for osteomyelitis (bone infection)o No Have you had any tests for circulation on your legso No Eyes Complaints and Symptoms: Positive for: Glasses / Contacts - glasses Negative for: Dry Eyes; Vision Changes Medical History: Positive for: Cataracts - left eye; Glaucoma - left eye Negative for: Optic Neuritis Past Medical History Notes: right eye artificial Ear/Nose/Mouth/Throat Complaints and Symptoms: No Complaints or Symptoms Complaints and Symptoms: Negative for: Difficult clearing ears; Sinusitis Medical History: Negative for: Chronic sinus problems/congestion; Middle ear problems Hematologic/Lymphatic Complaints and Symptoms: No Complaints or Symptoms Complaints and Symptoms: Negative for: Bleeding / Clotting Disorders; Human Immunodeficiency Virus Respiratory Complaints and Symptoms: Positive for: Shortness of Breath Sarah Dougherty, Sarah L. ([id number]) Medical History: Positive for: Asthma; Chronic Obstructive Pulmonary Disease (COPD) Negative for: Aspiration; Pneumothorax; Sleep Apnea; Tuberculosis Cardiovascular Complaints and Symptoms: Positive for: LE edema Negative for: Chest pain Medical History: Positive for: Arrhythmia - hx a fib; Congestive Heart Failure; Hypertension Negative for: Angina; Coronary Artery Disease; Deep Vein Thrombosis; Hypotension; Myocardial Infarction; Peripheral Arterial Disease; Peripheral Venous Disease; Phlebitis; Vasculitis Past Medical History Notes: pacemaker, long qt syndrome Gastrointestinal Complaints and Symptoms: No Complaints or Symptoms Complaints and Symptoms: Negative for: Frequent diarrhea; Nausea;  Vomiting Medical History: Negative for: Cirrhosis ; Colitis; Crohnos; Hepatitis A; Hepatitis B; Hepatitis C Endocrine Complaints and Symptoms: No Complaints or Symptoms  Complaints and Symptoms: Negative for: Hepatitis; Thyroid disease; Polydypsia (Excessive Thirst) Medical History: Positive for: Type II Diabetes Negative for: Type I Diabetes Treated with: Oral agents Genitourinary Complaints and Symptoms: No Complaints or Symptoms Complaints and Symptoms: Negative for: Kidney failure/ Dialysis; Incontinence/dribbling Medical History: Sarah Dougherty, CORDLE ([id number]) Negative for: End Stage Renal Disease Immunological Complaints and Symptoms: Negative for: Hives; Itching Medical History: Negative for: Lupus Erythematosus; Raynaudos; Scleroderma Integumentary (Skin) Complaints and Symptoms: No Complaints or Symptoms Complaints and Symptoms: Negative for: Wounds; Bleeding or bruising tendency; Breakdown; Swelling Medical History: Negative for: History of Burn; History of pressure wounds Musculoskeletal Complaints and Symptoms: No Complaints or Symptoms Complaints and Symptoms: Negative for: Muscle Pain; Muscle Weakness Medical History: Positive for: Osteoarthritis Negative for: Gout; Rheumatoid Arthritis; Osteomyelitis Neurologic Complaints and Symptoms: No Complaints or Symptoms Complaints and Symptoms: Negative for: Numbness/parasthesias; Focal/Weakness Medical History: Negative for: Dementia; Neuropathy; Quadriplegia; Paraplegia; Seizure Disorder Psychiatric Complaints and Symptoms: No Complaints or Symptoms Complaints and Symptoms: Negative for: Anxiety; Claustrophobia MAURIANNA, BENARD. ([id number]) Medical History: Negative for: Anorexia/bulimia; Confinement Anxiety Constitutional Symptoms (General Health) Complaints and Symptoms: No Complaints or Symptoms Oncologic Complaints and Symptoms: No Complaints or Symptoms Medical History: Negative for: Received  Chemotherapy; Received Radiation HBO Extended History Items Eyes: Eyes: Cataracts Glaucoma Immunizations Pneumococcal Vaccine: Received Pneumococcal Vaccination: No Immunization Notes: up to date Family and Social History Never smoker; Marital Status - Widowed; Alcohol Use: Never; Drug Use: No History; Caffeine Use: Moderate; Financial Concerns: No; Food, Clothing or Shelter Needs: No; Support System Lacking: No; Transportation Concerns: No; Advanced Directives: Yes (Not Provided); Patient does not want information on Advanced Directives; Living Will: Yes (Not Provided); Medical Power of Attorney: Yes - Merryl Buckels - son (Not Provided) Physician Affirmation I have reviewed and agree with the above information. Electronic Signature(s) Signed: 09/18/2016 3:31:51 PM By: Christin Fudge MD, FACS Signed: 09/18/2016 4:27:13 PM By: Montey Hora Entered By: Christin Fudge on 09/18/2016 08:44:10 Sarah Dougherty ([id number]) -------------------------------------------------------------------------------- SuperBill Details Patient Name: Sarah Dougherty. Date of Service: 09/18/2016 Medical Record Number: [id number] Patient Account Number: [id number] Date of Birth/Sex: Apr 10, 1922 (83 y.o. Female) Treating RN: Montey Hora Primary Care Provider: Lamonte Sakai Other Clinician: Referring Provider: Lamonte Sakai Treating Provider/Extender: Frann Rider in Treatment: 0 Diagnosis Coding ICD-10 Codes Code Description E11.622 Type 2 diabetes mellitus with other skin ulcer M70.961 Unspecified soft tissue disorder related to use, overuse and pressure, right lower leg Z79.01 Long term (current) use of anticoagulants Facility Procedures CPT4 Code: [id number] Description: 99213 - WOUND CARE VISIT-LEV 3 EST PT Modifier: Quantity: 1 Physician Procedures CPT4: Description Modifier Quantity Code [id number] WC PHYS LEVEL 3 o NEW PT 1 ICD-10 Description Diagnosis E11.622 Type 2 diabetes mellitus with  other skin ulcer M70.961 Unspecified soft tissue disorder related to use, overuse and pressure, right lower leg  Z79.01 Long term (current) use of anticoagulants Electronic Signature(s) Signed: 09/18/2016 12:52:12 PM By: Christin Fudge MD, FACS Previous Signature: 09/18/2016 8:53:39 AM Version By: Montey Hora Previous Signature: 09/18/2016 8:49:18 AM Version By: Christin Fudge MD, FACS Entered By: Christin Fudge on 09/18/2016 12:52:11

## 2016-09-25 ENCOUNTER — Encounter: Payer: PPO | Admitting: Surgery

## 2016-09-25 DIAGNOSIS — L988 Other specified disorders of the skin and subcutaneous tissue: Secondary | ICD-10-CM | POA: Diagnosis not present

## 2016-09-25 DIAGNOSIS — E11622 Type 2 diabetes mellitus with other skin ulcer: Secondary | ICD-10-CM | POA: Diagnosis not present

## 2016-09-27 NOTE — Progress Notes (Signed)
MECHELLE, PATES ([id number]) Visit Report for 09/25/2016 Chief Complaint Document Details Patient Name: Sarah Dougherty, Sarah Dougherty. Date of Service: 09/25/2016 9:15 AM Medical Record Number: [id number] Patient Account Number: [id number] Date of Birth/Sex: [birth year] (83 y.o. Female) Treating RN: Montey Hora Primary Care Provider: Lamonte Sakai Other Clinician: Referring Provider: Lamonte Sakai Treating Provider/Extender: Frann Rider in Treatment: 1 Information Obtained from: Patient Chief Complaint Patient presents to the wound care center due with non-wound condition(s) due to her hematoma to her right shin which she's had for 5 days Electronic Signature(s) Signed: 09/25/2016 10:08:16 AM By: Christin Fudge MD, FACS Entered By: Christin Fudge on 09/25/2016 10:08:16 Sarah Dougherty ([id number]) -------------------------------------------------------------------------------- HPI Details Patient Name: Sarah Dougherty. Date of Service: 09/25/2016 9:15 AM Medical Record Number: [id number] Patient Account Number: [id number] Date of Birth/Sex: Jan 27, 1922 (83 y.o. Female) Treating RN: Montey Hora Primary Care Provider: Lamonte Sakai Other Clinician: Referring Provider: Lamonte Sakai Treating Provider/Extender: Frann Rider in Treatment: 1 History of Present Illness Location: right shin Quality: Patient reports experiencing a dull pain to affected area(s). Severity: Patient states wound (s) are getting better. Duration: Patient has had the wound for < 5 days prior to presenting for treatment Timing: Pain in wound is Intermittent (comes and goes Context: The wound occurred when the patient had a blunt injury against a bedpost Modifying Factors: Other treatment(s) tried include:she has been put on doxycycline Associated Signs and Symptoms: Patient reports having increase swelling. HPI Description: 83 year old patient sent to our clinic by Dr. Lamonte Sakai who saw her on 09/15/2016  for a hematoma on the right shin of about 5 days' duration. the patient injured it on a bedpost and has had ecchymosis and a hematoma. She is on a Eliquis. past medical history significant for diabetes mellitus asthma atrial fibrillation deafness and presence of cardiac pacemaker. Electronic Signature(s) Signed: 09/25/2016 10:08:20 AM By: Christin Fudge MD, FACS Entered By: Christin Fudge on 09/25/2016 10:08:20 Sarah Dougherty ([id number]) -------------------------------------------------------------------------------- Physical Exam Details Patient Name: Sarah Dougherty. Date of Service: 09/25/2016 9:15 AM Medical Record Number: [id number] Patient Account Number: [id number] Date of Birth/Sex: [birth year] (83 y.o. Female) Treating RN: Montey Hora Primary Care Provider: Lamonte Sakai Other Clinician: Referring Provider: Lamonte Sakai Treating Provider/Extender: Frann Rider in Treatment: 1 Constitutional . Pulse regular. Respirations normal and unlabored. Afebrile. . Eyes Nonicteric. Reactive to light. Ears, Nose, Mouth, and Throat Lips, teeth, and gums WNL.Marland Kitchen Moist mucosa without lesions. Neck supple and nontender. No palpable supraclavicular or cervical adenopathy. Normal sized without goiter. Respiratory WNL. No retractions.. Cardiovascular Pedal Pulses WNL. No clubbing, cyanosis or edema. Chest Breasts symmetical and no nipple discharge.. Breast tissue WNL, no masses, lumps, or tenderness.. Lymphatic No adneopathy. No adenopathy. No adenopathy. Musculoskeletal Adexa without tenderness or enlargement.. Digits and nails w/o clubbing, cyanosis, infection, petechiae, ischemia, or inflammatory conditions.. Integumentary (Hair, Skin) No suspicious lesions. No crepitus or fluctuance. No peri-wound warmth or erythema. No masses.Marland Kitchen Psychiatric Judgement and insight Intact.. No evidence of depression, anxiety, or agitation.. Notes the hematoma is still intact and there is  minimal fluctuation and the skin has not broken down. Electronic Signature(s) Signed: 09/25/2016 10:08:45 AM By: Christin Fudge MD, FACS Entered By: Christin Fudge on 09/25/2016 10:08:44 Sarah Dougherty ([id number]) -------------------------------------------------------------------------------- Physician Orders Details Patient Name: Sarah Dougherty. Date of Service: 09/25/2016 9:15 AM Medical Record Number: [id number] Patient Account Number: [id number] Date of Birth/Sex: 1922-03-23 (83 y.o. Female) Treating RN: Montey Hora Primary Care Provider: Lamonte Sakai Other Clinician: Referring Provider: Lamonte Sakai  Treating Provider/Extender: Frann Rider in Treatment: 1 Verbal / Phone Orders: No Diagnosis Coding Wound Cleansing o May Shower, gently pat wound dry prior to applying new dressing. Secondary Dressing o Boardered Foam Dressing Dressing Change Frequency o Other: - as needed Follow-up Appointments o Return Appointment in 1 week. Edema Control o Elevate legs to the level of the heart and pump ankles as often as possible Electronic Signature(s) Signed: 09/25/2016 4:29:00 PM By: Christin Fudge MD, FACS Signed: 09/25/2016 5:57:13 PM By: Montey Hora Entered By: Montey Hora on 09/25/2016 09:43:44 Sarah Dougherty ([id number]) -------------------------------------------------------------------------------- Problem List Details Patient Name: Sarah Dougherty, Sarah L. Date of Service: 09/25/2016 9:15 AM Medical Record Number: [id number] Patient Account Number: [id number] Date of Birth/Sex: 12-Mar-1922 (83 y.o. Female) Treating RN: Montey Hora Primary Care Provider: Lamonte Sakai Other Clinician: Referring Provider: Lamonte Sakai Treating Provider/Extender: Frann Rider in Treatment: 1 Active Problems ICD-10 Encounter Code Description Active Date Diagnosis E11.622 Type 2 diabetes mellitus with other skin ulcer 09/18/2016 Yes M70.961 Unspecified soft tissue  disorder related to use, overuse 09/18/2016 Yes and pressure, right lower leg Z79.01 Long term (current) use of anticoagulants 09/18/2016 Yes Inactive Problems Resolved Problems Electronic Signature(s) Signed: 09/25/2016 10:07:55 AM By: Christin Fudge MD, FACS Entered By: Christin Fudge on 09/25/2016 10:07:55 Sarah Dougherty ([id number]) -------------------------------------------------------------------------------- Progress Note Details Patient Name: Sarah Dougherty. Date of Service: 09/25/2016 9:15 AM Medical Record Number: [id number] Patient Account Number: [id number] Date of Birth/Sex: February 05, 1922 (83 y.o. Female) Treating RN: Montey Hora Primary Care Provider: Lamonte Sakai Other Clinician: Referring Provider: Lamonte Sakai Treating Provider/Extender: Frann Rider in Treatment: 1 Subjective Chief Complaint Information obtained from Patient Patient presents to the wound care center due with non-wound condition(s) due to her hematoma to her right shin which she's had for 5 days History of Present Illness (HPI) The following HPI elements were documented for the patient's wound: Location: right shin Quality: Patient reports experiencing a dull pain to affected area(s). Severity: Patient states wound (s) are getting better. Duration: Patient has had the wound for < 5 days prior to presenting for treatment Timing: Pain in wound is Intermittent (comes and goes Context: The wound occurred when the patient had a blunt injury against a bedpost Modifying Factors: Other treatment(s) tried include:she has been put on doxycycline Associated Signs and Symptoms: Patient reports having increase swelling. 83 year old patient sent to our clinic by Dr. Lamonte Sakai who saw her on 09/15/2016 for a hematoma on the right shin of about 5 days' duration. the patient injured it on a bedpost and has had ecchymosis and a hematoma. She is on a Eliquis. past medical history significant for diabetes  mellitus asthma atrial fibrillation deafness and presence of cardiac pacemaker. Objective Constitutional Pulse regular. Respirations normal and unlabored. Afebrile. Vitals Time Taken: 9:24 AM, Height: 61 in, Weight: 117 lbs, BMI: 22.1, Temperature: 97.7 F, Pulse: 84 bpm, Respiratory Rate: 18 breaths/min, Blood Pressure: 132/65 mmHg. Eyes Nonicteric. Reactive to light. Sarah Dougherty, Sarah L. ([id number]) Ears, Nose, Mouth, and Throat Lips, teeth, and gums WNL.Marland Kitchen Moist mucosa without lesions. Neck supple and nontender. No palpable supraclavicular or cervical adenopathy. Normal sized without goiter. Respiratory WNL. No retractions.. Cardiovascular Pedal Pulses WNL. No clubbing, cyanosis or edema. Chest Breasts symmetical and no nipple discharge.. Breast tissue WNL, no masses, lumps, or tenderness.. Lymphatic No adneopathy. No adenopathy. No adenopathy. Musculoskeletal Adexa without tenderness or enlargement.. Digits and nails w/o clubbing, cyanosis, infection, petechiae, ischemia, or inflammatory conditions.Marland Kitchen Psychiatric Judgement and insight Intact.. No evidence of depression,  anxiety, or agitation.. General Notes: the hematoma is still intact and there is minimal fluctuation and the skin has not broken down. Integumentary (Hair, Skin) No suspicious lesions. No crepitus or fluctuance. No peri-wound warmth or erythema. No masses.. Other Condition(s) Patient presents with Other Dermatologic Condition located on the Right Leg. The skin appearance did not exhibit: Atrophie Blanche, Callus, Crepitus, Cyanosis, Dry/Scaly, Ecchymosis, Erythema, Excoriation, Friable, Hemosiderin Staining, Induration, Maceration, Mottled, Pallor, Rash, Rubor, Scarring. Skin temperature was noted as No Abnormality. Assessment Active Problems ICD-10 E11.622 - Type 2 diabetes mellitus with other skin ulcer M70.961 - Unspecified soft tissue disorder related to use, overuse and pressure, right lower leg Z79.01 -  Long term (current) use of anticoagulants Sarah Dougherty, Sarah L. ([id number]) Plan Wound Cleansing: May Shower, gently pat wound dry prior to applying new dressing. Secondary Dressing: Boardered Foam Dressing Dressing Change Frequency: Other: - as needed Follow-up Appointments: Return Appointment in 1 week. Edema Control: Elevate legs to the level of the heart and pump ankles as often as possible I have carefully reviewed the wound and I have discussed management with her son who was at the bedside and have recommended protecting the wound with a bordered foam and observing this carefully. She will return to see me next week only if the wound has opened out and there is a shallow ulcer and we will make appropriate dressing changes if and when required. If the hematoma still intact he will call us and cancel the appointment Electronic Signature(s) Signed: 09/25/2016 4:31:15 PM By: Christin Fudge MD, FACS Previous Signature: 09/25/2016 10:10:08 AM Version By: Christin Fudge MD, FACS Entered By: Christin Fudge on 09/25/2016 16:31:14 Sarah Dougherty ([id number]) -------------------------------------------------------------------------------- SuperBill Details Patient Name: Sarah Axe L. Date of Service: 09/25/2016 Medical Record Number: [id number] Patient Account Number: [id number] Date of Birth/Sex: Apr 10, 1922 (83 y.o. Female) Treating RN: Montey Hora Primary Care Provider: Lamonte Sakai Other Clinician: Referring Provider: Lamonte Sakai Treating Provider/Extender: Frann Rider in Treatment: 1 Diagnosis Coding ICD-10 Codes Code Description E11.622 Type 2 diabetes mellitus with other skin ulcer M70.961 Unspecified soft tissue disorder related to use, overuse and pressure, right lower leg Z79.01 Long term (current) use of anticoagulants Facility Procedures CPT4 Code: [id number] Description: [phone] - WOUND CARE VISIT-LEV 2 EST PT Modifier: Quantity: 1 Physician Procedures CPT4:  Description Modifier Quantity Code [id number] - WC PHYS LEVEL 3 - EST PT 1 ICD-10 Description Diagnosis E11.622 Type 2 diabetes mellitus with other skin ulcer M70.961 Unspecified soft tissue disorder related to use, overuse and pressure, right  lower leg Z79.01 Long term (current) use of anticoagulants Electronic Signature(s) Signed: 09/25/2016 10:10:21 AM By: Christin Fudge MD, FACS Entered By: Christin Fudge on 09/25/2016 10:10:20

## 2016-09-27 NOTE — Progress Notes (Addendum)
CALAIS, SVEHLA ([id number]) Visit Report for 09/25/2016 Arrival Information Details Patient Name: Sarah Dougherty, Sarah Dougherty. Date of Service: 09/25/2016 9:15 AM Medical Record Number: [id number] Patient Account Number: [id number] Date of Birth/Sex: 01-16-1922 (83 y.o. Female) Treating RN: Montey Hora Primary Care Patra Gherardi: Lamonte Sakai Other Clinician: Referring Faydra Korman: Lamonte Sakai Treating Makani Seckman/Extender: Frann Rider in Treatment: 1 Visit Information History Since Last Visit Added or deleted any medications: No Patient Arrived: Kasandra Knudsen Any new allergies or adverse reactions: No Arrival Time: 09:22 Had a fall or experienced change in No Accompanied By: son activities of daily living that may affect Transfer Assistance: None risk of falls: Patient Identification Verified: Yes Signs or symptoms of abuse/neglect since last No Secondary Verification Process Yes visito Completed: Hospitalized since last visit: No Patient Has Alerts: Yes Has Dressing in Place as Prescribed: Yes Patient Alerts: Patient on Blood Pain Present Now: No Thinner Eliquis DMII ABI [place] >220 Electronic Signature(s) Signed: 09/25/2016 5:57:13 PM By: Montey Hora Entered By: Montey Hora on 09/25/2016 09:22:19 Sarah Dougherty ([id number]) -------------------------------------------------------------------------------- Clinic Level of Care Assessment Details Patient Name: Sarah Dougherty. Date of Service: 09/25/2016 9:15 AM Medical Record Number: [id number] Patient Account Number: [id number] Date of Birth/Sex: 10/29/1921 (83 y.o. Female) Treating RN: Montey Hora Primary Care Dossie Ocanas: Lamonte Sakai Other Clinician: Referring Anthonee Gelin: Lamonte Sakai Treating Luwana Butrick/Extender: Frann Rider in Treatment: 1 Clinic Level of Care Assessment Items TOOL 4 Quantity Score []  - Use when only an EandM is performed on FOLLOW-UP visit 0 ASSESSMENTS - Nursing Assessment / Reassessment X -  Reassessment of Co-morbidities (includes updates in patient status) 1 10 X - Reassessment of Adherence to Treatment Plan 1 5 ASSESSMENTS - Wound and Skin Assessment / Reassessment []  - Simple Wound Assessment / Reassessment - one wound 0 []  - Complex Wound Assessment / Reassessment - multiple wounds 0 X - Dermatologic / Skin Assessment (not related to wound area) 1 10 ASSESSMENTS - Focused Assessment []  - Circumferential Edema Measurements - multi extremities 0 []  - Nutritional Assessment / Counseling / Intervention 0 X - Lower Extremity Assessment (monofilament, tuning fork, pulses) 1 5 []  - Peripheral Arterial Disease Assessment (using hand held doppler) 0 ASSESSMENTS - Ostomy and/or Continence Assessment and Care []  - Incontinence Assessment and Management 0 []  - Ostomy Care Assessment and Management (repouching, etc.) 0 PROCESS - Coordination of Care X - Simple Patient / Family Education for ongoing care 1 15 []  - Complex (extensive) Patient / Family Education for ongoing care 0 []  - Staff obtains Programmer, systems, Records, Test Results / Process Orders 0 []  - Staff telephones HHA, Nursing Homes / Clarify orders / etc 0 []  - Routine Transfer to another Facility (non-emergent condition) 0 Wedge, Kyri L. ([id number]) []  - Routine Hospital Admission (non-emergent condition) 0 []  - New Admissions / Biomedical engineer / Ordering NPWT, Apligraf, etc. 0 []  - Emergency Hospital Admission (emergent condition) 0 X - Simple Discharge Coordination 1 10 []  - Complex (extensive) Discharge Coordination 0 PROCESS - Special Needs []  - Pediatric / Minor Patient Management 0 []  - Isolation Patient Management 0 []  - Hearing / Language / Visual special needs 0 []  - Assessment of Community assistance (transportation, D/C planning, etc.) 0 []  - Additional assistance / Altered mentation 0 []  - Support Surface(s) Assessment (bed, cushion, seat, etc.) 0 INTERVENTIONS - Wound Cleansing / Measurement []  -  Simple Wound Cleansing - one wound 0 []  - Complex Wound Cleansing - multiple wounds 0 []  - Wound Imaging (photographs - any number of  wounds) 0 []  - Wound Tracing (instead of photographs) 0 []  - Simple Wound Measurement - one wound 0 []  - Complex Wound Measurement - multiple wounds 0 INTERVENTIONS - Wound Dressings []  - Small Wound Dressing one or multiple wounds 0 []  - Medium Wound Dressing one or multiple wounds 0 []  - Large Wound Dressing one or multiple wounds 0 []  - Application of Medications - topical 0 []  - Application of Medications - injection 0 INTERVENTIONS - Miscellaneous []  - External ear exam 0 Sarah Dougherty, Sarah L. ([id number]) []  - Specimen Collection (cultures, biopsies, blood, body fluids, etc.) 0 []  - Specimen(s) / Culture(s) sent or taken to Lab for analysis 0 []  - Patient Transfer (multiple staff / Harrel Lemon Lift / Similar devices) 0 []  - Simple Staple / Suture removal (25 or less) 0 []  - Complex Staple / Suture removal (26 or more) 0 []  - Hypo / Hyperglycemic Management (close monitor of Blood Glucose) 0 []  - Ankle / Brachial Index (ABI) - do not check if billed separately 0 X - Vital Signs 1 5 Has the patient been seen at the hospital within the last three years: Yes Total Score: 60 Level Of Care: New/Established - Level 2 Electronic Signature(s) Signed: 09/25/2016 5:57:13 PM By: Montey Hora Entered By: Montey Hora on 09/25/2016 10:05:07 Sarah Dougherty ([id number]) -------------------------------------------------------------------------------- Encounter Discharge Information Details Patient Name: Sarah Axe L. Date of Service: 09/25/2016 9:15 AM Medical Record Number: [id number] Patient Account Number: [id number] Date of Birth/Sex: 1921-08-02 (83 y.o. Female) Treating RN: Montey Hora Primary Care Beretta Ginsberg: Lamonte Sakai Other Clinician: Referring Winnie Umali: Lamonte Sakai Treating Sherria Riemann/Extender: Frann Rider in Treatment: 1 Encounter  Discharge Information Items Discharge Pain Level: 0 Discharge Condition: Stable Ambulatory Status: Cane Discharge Destination: Home Transportation: Private Auto Accompanied By: son Schedule Follow-up Appointment: Yes Medication Reconciliation completed No and provided to Patient/Care Tanvi Gatling: Provided on Clinical Summary of Care: 09/25/2016 Form Type Recipient Paper Patient AB Electronic Signature(s) Signed: 09/25/2016 10:05:56 AM By: Montey Hora Previous Signature: 09/25/2016 9:45:22 AM Version By: Ruthine Dose Entered By: Montey Hora on 09/25/2016 10:05:56 Sarah Dougherty ([id number]) -------------------------------------------------------------------------------- Lower Extremity Assessment Details Patient Name: Sarah Axe L. Date of Service: 09/25/2016 9:15 AM Medical Record Number: [id number] Patient Account Number: [id number] Date of Birth/Sex: 04-29-1921 (83 y.o. Female) Treating RN: Montey Hora Primary Care Katarzyna Wolven: Lamonte Sakai Other Clinician: Referring Aalia Greulich: Lamonte Sakai Treating Dane Bloch/Extender: Frann Rider in Treatment: 1 Edema Assessment Assessed: [Left: No] [Right: No] Edema: [Left: N] [Right: o] Vascular Assessment Pulses: Dorsalis Pedis Palpable: [Right:Yes] Posterior Tibial Extremity colors, hair growth, and conditions: Extremity Color: [Right:Hyperpigmented] Hair Growth on Extremity: [Right:No] Temperature of Extremity: [Right:Warm] Capillary Refill: [Right:< 3 seconds] Electronic Signature(s) Signed: 09/25/2016 5:57:13 PM By: Montey Hora Entered By: Montey Hora on 09/25/2016 09:26:23 Sarah Dougherty ([id number]) -------------------------------------------------------------------------------- Multi Wound Chart Details Patient Name: Sarah Dougherty. Date of Service: 09/25/2016 9:15 AM Medical Record Number: [id number] Patient Account Number: [id number] Date of Birth/Sex: February 06, 1922 (83 y.o. Female) Treating RN:  Montey Hora Primary Care Lesley Galentine: Lamonte Sakai Other Clinician: Referring Sonnia Strong: Lamonte Sakai Treating Karlynn Furrow/Extender: Frann Rider in Treatment: 1 Vital Signs Height(in): 61 Pulse(bpm): 84 Weight(lbs): 117 Blood Pressure 132/65 (mmHg): Body Mass Index(BMI): 22 Temperature(F): 97.7 Respiratory Rate 18 (breaths/min): Wound Assessments Treatment Notes Electronic Signature(s) Signed: 09/25/2016 10:08:10 AM By: Christin Fudge MD, FACS Entered By: Christin Fudge on 09/25/2016 10:08:10 Sarah Dougherty ([id number]) -------------------------------------------------------------------------------- [place] Details Patient Name: Sarah Dougherty. Date of Service: 09/25/2016 9:15 AM Medical Record Number:  [id number] Patient Account Number: [id number] Date of Birth/Sex: 03-28-1922 (83 y.o. Female) Treating RN: Montey Hora Primary Care Zollie Clemence: Lamonte Sakai Other Clinician: Referring Kiaan Overholser: Lamonte Sakai Treating Shelie Lansing/Extender: Frann Rider in Treatment: 1 Active Inactive Electronic Signature(s) Signed: 10/08/2016 2:50:09 PM By: Gretta Cool, BSN, RN, CWS, Kim RN, BSN Signed: 10/09/2016 4:27:27 PM By: Montey Hora Previous Signature: 09/25/2016 5:57:13 PM Version By: Montey Hora Entered By: Gretta Cool BSN, RN, CWS, Kim on 10/08/2016 14:50:09 Sarah Dougherty ([id number]) -------------------------------------------------------------------------------- Non-Wound Condition Assessment Details Patient Name: Sarah Dougherty, Sarah L. Date of Service: 09/25/2016 9:15 AM Medical Record Number: [id number] Patient Account Number: [id number] Date of Birth/Sex: 1922/01/31 (83 y.o. Female) Treating RN: Montey Hora Primary Care Arch Methot: Lamonte Sakai Other Clinician: Referring Lene Mckay: Lamonte Sakai Treating Nkechi Linehan/Extender: Frann Rider in Treatment: 1 Non-Wound Condition: Condition: Other Dermatologic Condition Location: Leg Side:  Right Notes: small hematoma Photos Periwound Skin Texture Texture Color No Abnormalities Noted: No No Abnormalities Noted: No Callus: No Atrophie Blanche: No Crepitus: No Cyanosis: No Excoriation: No Ecchymosis: No Friable: No Erythema: No Induration: No Hemosiderin Staining: No Rash: No Mottled: No Scarring: No Pallor: No Rubor: No Moisture No Abnormalities Noted: No Temperature / Pain Dry / Scaly: No Temperature: No Abnormality Maceration: No Electronic Signature(s) Signed: 09/25/2016 12:13:10 PM By: Montey Hora Entered By: Montey Hora on 09/25/2016 12:13:09 Sarah Dougherty ([id number]) -------------------------------------------------------------------------------- Pain Assessment Details Patient Name: Sarah Axe L. Date of Service: 09/25/2016 9:15 AM Medical Record Number: [id number] Patient Account Number: [id number] Date of Birth/Sex: [birth year] (83 y.o. Female) Treating RN: Montey Hora Primary Care Roderick Sweezy: Lamonte Sakai Other Clinician: Referring Harman Langhans: Lamonte Sakai Treating Kaitlyn Franko/Extender: Frann Rider in Treatment: 1 Active Problems Location of Pain Severity and Description of Pain Patient Has Paino No Site Locations Pain Management and Medication Current Pain Management: Notes Topical or injectable lidocaine is offered to patient for acute pain when surgical debridement is performed. If needed, Patient is instructed to use over the counter pain medication for the following 24-48 hours after debridement. Wound care MDs do not prescribed pain medications. Patient has chronic pain or uncontrolled pain. Patient has been instructed to make an appointment with their Primary Care Physician for pain management. Electronic Signature(s) Signed: 09/25/2016 5:57:13 PM By: Montey Hora Entered By: Montey Hora on 09/25/2016 09:24:18 Sarah Dougherty  ([id number]) -------------------------------------------------------------------------------- Patient/Caregiver Education Details Patient Name: Sarah Dougherty Date of Service: 09/25/2016 9:15 AM Medical Record Number: [id number] Patient Account Number: [id number] Date of Birth/Gender: 1921-07-27 (83 y.o. Female) Treating RN: Montey Hora Primary Care Physician: Lamonte Sakai Other Clinician: Referring Physician: Lamonte Sakai Treating Physician/Extender: Frann Rider in Treatment: 1 Education Assessment Education Provided To: Patient Education Topics Provided Wound/Skin Impairment: Handouts: Other: what to do if hematoma open up and starts draining Methods: Explain/Verbal Responses: State content correctly Electronic Signature(s) Signed: 09/25/2016 5:57:13 PM By: Montey Hora Entered By: Montey Hora on 09/25/2016 10:07:07 Sarah Dougherty ([id number]) -------------------------------------------------------------------------------- [place] Details Patient Name: Sarah Axe L. Date of Service: 09/25/2016 9:15 AM Medical Record Number: [id number] Patient Account Number: [id number] Date of Birth/Sex: [birth year] (83 y.o. Female) Treating RN: Montey Hora Primary Care Shaquila Sigman: Lamonte Sakai Other Clinician: Referring Valda Christenson: Lamonte Sakai Treating Malaquias Lenker/Extender: Frann Rider in Treatment: 1 Vital Signs Time Taken: 09:24 Temperature (F): 97.7 Height (in): 61 Pulse (bpm): 84 Weight (lbs): 117 Respiratory Rate (breaths/min): 18 Body Mass Index (BMI): 22.1 Blood Pressure (mmHg): 132/65 Reference Range: 80 - 120 mg / dl Electronic Signature(s) Signed: 09/25/2016 5:57:13 PM By: Montey Hora Entered By: Marjory Lies,  Joanna on 09/25/2016 09:24:40

## 2016-10-02 ENCOUNTER — Ambulatory Visit: Payer: PPO | Admitting: Physician Assistant

## 2016-10-03 DIAGNOSIS — E1121 Type 2 diabetes mellitus with diabetic nephropathy: Secondary | ICD-10-CM | POA: Diagnosis not present

## 2016-10-03 DIAGNOSIS — E782 Mixed hyperlipidemia: Secondary | ICD-10-CM | POA: Diagnosis not present

## 2016-10-03 DIAGNOSIS — R05 Cough: Secondary | ICD-10-CM | POA: Diagnosis not present

## 2016-10-03 DIAGNOSIS — E559 Vitamin D deficiency, unspecified: Secondary | ICD-10-CM | POA: Diagnosis not present

## 2016-10-03 DIAGNOSIS — I4891 Unspecified atrial fibrillation: Secondary | ICD-10-CM | POA: Diagnosis not present

## 2016-10-03 DIAGNOSIS — I10 Essential (primary) hypertension: Secondary | ICD-10-CM | POA: Diagnosis not present

## 2016-11-04 DIAGNOSIS — I10 Essential (primary) hypertension: Secondary | ICD-10-CM | POA: Diagnosis not present

## 2016-11-04 DIAGNOSIS — E119 Type 2 diabetes mellitus without complications: Secondary | ICD-10-CM | POA: Diagnosis not present

## 2016-11-04 DIAGNOSIS — E782 Mixed hyperlipidemia: Secondary | ICD-10-CM | POA: Diagnosis not present

## 2016-11-04 DIAGNOSIS — L03116 Cellulitis of left lower limb: Secondary | ICD-10-CM | POA: Diagnosis not present

## 2016-11-10 ENCOUNTER — Telehealth: Payer: Self-pay | Admitting: General Surgery

## 2016-11-10 NOTE — Telephone Encounter (Signed)
Son called reporting a 2 day history of a tender, thumb nail sized swelling at the site of cancer excised from the left face in December. No bleeding. Slightly red. Mother upset. Will see tomorrow at 9 AM.

## 2016-11-11 ENCOUNTER — Ambulatory Visit (INDEPENDENT_AMBULATORY_CARE_PROVIDER_SITE_OTHER): Payer: PPO | Admitting: General Surgery

## 2016-11-11 ENCOUNTER — Inpatient Hospital Stay: Payer: Self-pay

## 2016-11-11 ENCOUNTER — Encounter: Payer: Self-pay | Admitting: General Surgery

## 2016-11-11 VITALS — BP 136/74 | HR 88 | Resp 16 | Ht 60.0 in | Wt 117.0 lb

## 2016-11-11 DIAGNOSIS — C4442 Squamous cell carcinoma of skin of scalp and neck: Secondary | ICD-10-CM

## 2016-11-11 DIAGNOSIS — C760 Malignant neoplasm of head, face and neck: Secondary | ICD-10-CM | POA: Diagnosis not present

## 2016-11-11 DIAGNOSIS — I10 Essential (primary) hypertension: Secondary | ICD-10-CM | POA: Diagnosis not present

## 2016-11-11 DIAGNOSIS — R221 Localized swelling, mass and lump, neck: Secondary | ICD-10-CM

## 2016-11-11 DIAGNOSIS — L03116 Cellulitis of left lower limb: Secondary | ICD-10-CM | POA: Diagnosis not present

## 2016-11-11 DIAGNOSIS — E782 Mixed hyperlipidemia: Secondary | ICD-10-CM | POA: Diagnosis not present

## 2016-11-11 DIAGNOSIS — E1122 Type 2 diabetes mellitus with diabetic chronic kidney disease: Secondary | ICD-10-CM | POA: Diagnosis not present

## 2016-11-11 NOTE — Patient Instructions (Signed)
The patient is aware to call back for any questions or concerns.  

## 2016-11-11 NOTE — Progress Notes (Signed)
Patient ID: Sarah Dougherty, female   DOB: [birth year], 83 y.o.   MRN: [id number]  Chief Complaint  Patient presents with  . Follow-up    HPI Sarah Dougherty is a 83 y.o. female.  She noticed a knot left neck for a but 2-3 weeks. She state sit is getting larger and a little tender. She is having ringing in her ears.She describes hearing what sounds like a train or singing. She cannot discern which ear is producing these sounds. She is here with her son, Sarah Dougherty. He did contact me last evening reporting a 2 day history of the mass being present.  HPI  Past Medical History:  Diagnosis Date  . COPD (chronic obstructive pulmonary disease) ([place])   . Coronary artery disease   . Diabetes mellitus without complication ([place])   . Hypertension   . On home oxygen therapy    uses at night    Past Surgical History:  Procedure Laterality Date  . PACEMAKER INSERTION Left 12/12/2014   Procedure: INSERTION PACEMAKER;  Surgeon: Isaias Cowman, MD;  Location: [facility] ORS;  Service: Cardiovascular;  Laterality: Left;    Family History  Problem Relation Age of Onset  . Diabetes Brother   . Diabetes Son     Social History Social History  Substance Use Topics  . Smoking status: Never Smoker  . Smokeless tobacco: Never Used  . Alcohol use No    Allergies  Allergen Reactions  . Nitrofurantoin     Other reaction(s): Unknown  . Penicillins     Other reaction(s): Unknown  . Sulfa Antibiotics     Other reaction(s): Unknown  . Azithromycin Rash    Current Outpatient Prescriptions  Medication Sig Dispense Refill  . acetaminophen (TYLENOL 8 HOUR) 650 MG CR tablet Take 650 mg by mouth every 8 (eight) hours as needed for pain.    Marland Kitchen amLODipine (NORVASC) 5 MG tablet Take 5 mg by mouth daily.    . clotrimazole (GYNE-LOTRIMIN) 1 % vaginal cream Place 1 application vaginally daily as needed. For yeast infection.    Marland Kitchen glimepiride (AMARYL) 2 MG tablet Take 2 mg by mouth daily.    .  hydrocortisone cream (NEOSPORIN ECZEMA ESSENT MAX ST) 1 % Apply 1 application topically 2 (two) times daily as needed for itching (for sores.).    Marland Kitchen ipratropium-albuterol (DUONEB) 0.5-2.5 (3) MG/3ML SOLN Take 3 mLs by nebulization every 4 (four) hours as needed. 360 mL 0  . Rivaroxaban (XARELTO PO) Take 1 tablet by mouth daily.    . sodium chloride (OCEAN) 0.65 % nasal spray Place 2 sprays into the nose daily as needed.    . tiotropium (SPIRIVA) 18 MCG inhalation capsule Place 1 capsule (18 mcg total) into inhaler and inhale daily. 30 capsule 12   No current facility-administered medications for this visit.     Review of Systems Review of Systems  Constitutional: Negative.   Respiratory: Negative.   Cardiovascular: Negative.     Blood pressure 136/74, pulse 88, resp. rate 16, height 5' (1.524 m), weight 117 lb (53.1 kg).  Physical Exam Physical Exam  Constitutional: She is oriented to person, place, and time. She appears well-developed and well-nourished.  HENT:  Head:    Mouth/Throat: Oropharynx is clear and moist.  Eyes: Conjunctivae are normal. No scleral icterus.  Neck: Neck supple.  Cardiovascular: Normal rate and normal heart sounds.  An irregular rhythm present.  Pulses:      Carotid pulses are 2+ on the right side, and  2+ on the left side. No carotid bruits identified.  Pulmonary/Chest: Effort normal and breath sounds normal.  Lymphadenopathy:       Head (left side): Submandibular adenopathy present. No preauricular, no posterior auricular and no occipital adenopathy present.       Left cervical: No posterior cervical adenopathy present.       Left: No supraclavicular adenopathy present.  Neurological: She is alert and oriented to person, place, and time.  Skin: Skin is warm and dry.  Psychiatric: Her behavior is normal.    Data Reviewed 03/19/2016 pathology: Skin (M), left neck, excision - RESIDUAL POORLY DIFFERENTIATED SQUAMOUS CELL CARCINOMA, MARGINS FREE OF  INVASIVE CARCINOMA - PERIPHERAL MARGIN (3 O'CLOCK) FOCALLY INVOLVED BY IN-SITU CARCINOMA Microscopic Description A poorly differentiated epithelial tumor with squamous differentiation involving the entire thickness of the dermis and upper part of the subcutis. The margins are free of invasive carcinoma; however, there is an in situ component in the overlying epidermis which focally extends to the 3 o' clock surgical margin. MUAMMAR ARIDA MD  GROSS DESCRIPTION Shape: Elliptical including limited fat. Size: 30 x 16 x 8 mm Lesion: Appears to be a 7 mm crusted plaque. Orientation: Specimen has been clinically tagged at margin clinically designated as 12 o'clock. 9 o'clock to 3 o'clock   04/07/2016 admission for acute respiratory failure, COPD exacerbation.  Ultrasound examination of the left neck below the angle of jaw, just below the wide excision site from December 2017 showed a hypoechoic mass measuring 1.26 x 1.31 x 3.54 cm. No internal vascular flow. Immediately adjacent and anterior to this dominant mass was a 0.81 x 0.87 x 0.98 similar hypoechoic mass with banked posterior acoustic enhancement. These areas are suspicious for nodal disease.  The patient and her son were amenable to FNA sampling. 1 mL of 1% plain Xylocaine was utilized after skin cleansing with alcohol. A 22-gauge needle was used and multiple passes were completed through the dominant nodule. Slides 4 were prepared for cytologic review. Pressure was held over the biopsy site with scant bleeding noted. Band-Aid applied.   Assessment    Clinical exam suspicious for nodal metastasis of previously resected squamous cell carcinoma.    Plan    The patient's son will be contacted when the cytology results are available. (The patient hears very poorly on the phone) sees. This is likely metastatic disease.   If cytology confirms clinical impression will consider CT/ENT involvement, although I think it's unlikely she would be  candidate for neck dissection. Palliative radiation therapy may be appropriate.     HPI, Physical Exam, Assessment and Plan have been scribed under the direction and in the presence of Robert Bellow, MD. Karie Fetch, RN   I have completed the exam and reviewed the above documentation for accuracy and completeness.  I agree with the above.  Haematologist has been used and any errors in dictation or transcription are unintentional.  Hervey Ard, M.D., F.A.C.S.  Robert Bellow 11/11/2016, 9:22 AM

## 2016-11-12 ENCOUNTER — Telehealth: Payer: Self-pay | Admitting: General Surgery

## 2016-11-12 ENCOUNTER — Other Ambulatory Visit: Payer: Self-pay

## 2016-11-12 ENCOUNTER — Telehealth: Payer: Self-pay

## 2016-11-12 DIAGNOSIS — C4442 Squamous cell carcinoma of skin of scalp and neck: Secondary | ICD-10-CM

## 2016-11-12 NOTE — Telephone Encounter (Signed)
The son was notified that the FNA sample suggested metastatic carcinoma.  I spoke with Malon Kindle, M.D. from ENT. We'll arrange for a CT of the neck with contrast and a chest x-ray to look for distant metastatic disease. Dr. Richardson Landry will then see her to determine if she is a suitable candidate for neck dissection.

## 2016-11-12 NOTE — Telephone Encounter (Signed)
Call to patient's son to inform of scheduled CT of the neck, CXR, and referral to Dr Richardson Landry.

## 2016-11-13 ENCOUNTER — Telehealth: Payer: Self-pay | Admitting: *Deleted

## 2016-11-13 NOTE — Telephone Encounter (Signed)
Notify the son this is the best we can do, and that nothing is going to change between now and next week.

## 2016-11-13 NOTE — Telephone Encounter (Signed)
Patient was notified as instructed.   She wants to know if she should keep appointment with Dr. Bary Castilla as scheduled for 11-20-16 at 4:30 pm.

## 2016-11-13 NOTE — Telephone Encounter (Signed)
Patient's son called the office back today requesting a call from Dr. Bary Castilla.  He states his mom is upset and has been crying because the CT scan and appointment with Dr. Richardson Landry is not scheduled until late next week and she is afraid that things will change between now and then and it will be too late.

## 2016-11-13 NOTE — Telephone Encounter (Signed)
I returned patient's son's phone call that he left with the answering service last night after hours.  Patient has been scheduled for the following per Caryl-Lyn:  CT neck with contrast at [facility] for 11-20-16 at 8 am  (arrive 7:45 am). Prep: no solids 4 hours prior but patient may have clear liquids up until exam time and pick up prep kit.   Chest x-ray to be completed the same day as CT scan.   Also, appointment has been scheduled with Dr. Richardson Landry at [facility] for 11-21-16 at 1:30 pm per patient's son.

## 2016-11-17 NOTE — Telephone Encounter (Signed)
Per Dr Bary Castilla patient does not need to follow up on Thursday 11/20/16.

## 2016-11-18 DIAGNOSIS — I4891 Unspecified atrial fibrillation: Secondary | ICD-10-CM | POA: Diagnosis not present

## 2016-11-18 DIAGNOSIS — I509 Heart failure, unspecified: Secondary | ICD-10-CM | POA: Diagnosis not present

## 2016-11-18 DIAGNOSIS — I10 Essential (primary) hypertension: Secondary | ICD-10-CM | POA: Diagnosis not present

## 2016-11-20 ENCOUNTER — Ambulatory Visit: Payer: PPO | Admitting: General Surgery

## 2016-11-20 ENCOUNTER — Ambulatory Visit
Admission: RE | Admit: 2016-11-20 | Discharge: 2016-11-20 | Disposition: A | Discharge status: Home / Self Care | Payer: PPO | Source: Ambulatory Visit | Attending: General Surgery | Admitting: General Surgery

## 2016-11-20 ENCOUNTER — Ambulatory Visit: Admission: RE | Admit: 2016-11-20 | Payer: PPO | Source: Ambulatory Visit | Admitting: General Surgery

## 2016-11-20 DIAGNOSIS — I517 Cardiomegaly: Secondary | ICD-10-CM | POA: Diagnosis not present

## 2016-11-20 DIAGNOSIS — K118 Other diseases of salivary glands: Secondary | ICD-10-CM | POA: Insufficient documentation

## 2016-11-20 DIAGNOSIS — C4442 Squamous cell carcinoma of skin of scalp and neck: Secondary | ICD-10-CM | POA: Diagnosis not present

## 2016-11-20 DIAGNOSIS — R221 Localized swelling, mass and lump, neck: Secondary | ICD-10-CM | POA: Diagnosis not present

## 2016-11-20 HISTORY — DX: Unspecified asthma, uncomplicated: J45.909

## 2016-11-20 LAB — POCT I-STAT CREATININE: CREATININE: 1.1 mg/dL — AB (ref 0.44–1.00)

## 2016-11-20 MED ORDER — IOPAMIDOL (ISOVUE-300) INJECTION 61%
60.0000 mL | Freq: Once | INTRAVENOUS | Status: AC | PRN
  Administered 2016-11-20: 60 mL via INTRAVENOUS

## 2016-11-21 DIAGNOSIS — C07 Malignant neoplasm of parotid gland: Secondary | ICD-10-CM | POA: Diagnosis not present

## 2016-11-24 DIAGNOSIS — M170 Bilateral primary osteoarthritis of knee: Secondary | ICD-10-CM | POA: Diagnosis not present

## 2016-11-26 DIAGNOSIS — I509 Heart failure, unspecified: Secondary | ICD-10-CM | POA: Diagnosis not present

## 2016-11-26 DIAGNOSIS — Z8679 Personal history of other diseases of the circulatory system: Secondary | ICD-10-CM | POA: Diagnosis not present

## 2016-11-26 DIAGNOSIS — I4581 Long QT syndrome: Secondary | ICD-10-CM | POA: Diagnosis not present

## 2016-11-26 DIAGNOSIS — Z950 Presence of cardiac pacemaker: Secondary | ICD-10-CM | POA: Diagnosis not present

## 2016-11-27 DIAGNOSIS — R079 Chest pain, unspecified: Secondary | ICD-10-CM | POA: Diagnosis not present

## 2016-11-28 ENCOUNTER — Encounter
Admission: RE | Admit: 2016-11-28 | Discharge: 2016-11-28 | Disposition: A | Discharge status: Home / Self Care | Payer: PPO | Source: Ambulatory Visit | Attending: Otolaryngology | Admitting: Otolaryngology

## 2016-11-28 ENCOUNTER — Encounter: Payer: Self-pay | Admitting: *Deleted

## 2016-11-28 DIAGNOSIS — R0602 Shortness of breath: Secondary | ICD-10-CM | POA: Diagnosis not present

## 2016-11-28 DIAGNOSIS — Z01818 Encounter for other preprocedural examination: Secondary | ICD-10-CM | POA: Diagnosis not present

## 2016-11-28 DIAGNOSIS — Z01812 Encounter for preprocedural laboratory examination: Secondary | ICD-10-CM | POA: Insufficient documentation

## 2016-11-28 DIAGNOSIS — J449 Chronic obstructive pulmonary disease, unspecified: Secondary | ICD-10-CM | POA: Diagnosis not present

## 2016-11-28 DIAGNOSIS — R221 Localized swelling, mass and lump, neck: Secondary | ICD-10-CM | POA: Diagnosis not present

## 2016-11-28 HISTORY — DX: Presence of cardiac pacemaker: Z95.0

## 2016-11-28 HISTORY — DX: Unspecified glaucoma: H40.9

## 2016-11-28 HISTORY — DX: Chronic kidney disease, unspecified: N18.9

## 2016-11-28 HISTORY — DX: Unspecified atrial fibrillation: I48.91

## 2016-11-28 HISTORY — DX: Unspecified hearing loss, unspecified ear: H91.90

## 2016-11-28 HISTORY — DX: Thyrotoxicosis, unspecified without thyrotoxic crisis or storm: E05.90

## 2016-11-28 HISTORY — DX: Gastro-esophageal reflux disease without esophagitis: K21.9

## 2016-11-28 HISTORY — DX: Unspecified osteoarthritis, unspecified site: M19.90

## 2016-11-28 HISTORY — DX: Sleep apnea, unspecified: G47.30

## 2016-11-28 LAB — CBC
HEMATOCRIT: 40.1 % (ref 35.0–47.0)
Hemoglobin: 13 g/dL (ref 12.0–16.0)
MCH: 29.6 pg (ref 26.0–34.0)
MCHC: 32.4 g/dL (ref 32.0–36.0)
MCV: 91.4 fL (ref 80.0–100.0)
PLATELETS: 286 10*3/uL (ref 150–440)
RBC: 4.39 MIL/uL (ref 3.80–5.20)
RDW: 14.2 % (ref 11.5–14.5)
WBC: 9.8 10*3/uL (ref 3.6–11.0)

## 2016-11-28 LAB — DIFFERENTIAL
BASOS ABS: 0 10*3/uL (ref 0–0.1)
Basophils Relative: 0 %
Eosinophils Absolute: 0.1 10*3/uL (ref 0–0.7)
Eosinophils Relative: 1 %
LYMPHS PCT: 26 %
Lymphs Abs: 2.5 10*3/uL (ref 1.0–3.6)
Monocytes Absolute: 1.2 10*3/uL — ABNORMAL HIGH (ref 0.2–0.9)
Monocytes Relative: 12 %
NEUTROS ABS: 6 10*3/uL (ref 1.4–6.5)
Neutrophils Relative %: 61 %

## 2016-11-28 LAB — BASIC METABOLIC PANEL
ANION GAP: 9 (ref 5–15)
BUN: 42 mg/dL — AB (ref 6–20)
CHLORIDE: 108 mmol/L (ref 101–111)
CO2: 25 mmol/L (ref 22–32)
Calcium: 9.4 mg/dL (ref 8.9–10.3)
Creatinine, Ser: 1.35 mg/dL — ABNORMAL HIGH (ref 0.44–1.00)
GFR calc Af Amer: 38 mL/min — ABNORMAL LOW (ref >60)
GFR calc non Af Amer: 33 mL/min — ABNORMAL LOW (ref >60)
Glucose, Bld: 135 mg/dL — ABNORMAL HIGH (ref 65–99)
POTASSIUM: 4.5 mmol/L (ref 3.5–5.1)
SODIUM: 142 mmol/L (ref 135–145)

## 2016-11-28 NOTE — Patient Instructions (Signed)
  Your procedure is scheduled on: December 10, 2016 [facility] Report to Same Day Surgery 2nd floor medical mall [facility] Entrance-take elevator on left to 2nd floor.  Check in with surgery information desk.) To find out your arrival time please call [phone] between 1PM - 3PM on December 09, 2016 (TUESDAY )  Remember: Instructions that are not followed completely may result in serious medical risk, up to and including death, or upon the discretion of your surgeon and anesthesiologist your surgery may need to be rescheduled.    _x___ 1. Do not eat food or drink liquids after midnight. No gum chewing or  hard candies                      __x__ 2. No Alcohol for 24 hours before or after surgery.   __x__3. No Smoking for 24 prior to surgery.   ____  4. Bring all medications with you on the day of surgery if instructed.    __x__ 5. Notify your doctor if there is any change in your medical condition     (cold, fever, infections).     Do not wear jewelry, make-up, hairpins, clips or nail polish.  Do not wear lotions, powders, or perfumes. You may wear deodorant.  Do not shave 48 hours prior to surgery. Men may shave face and neck.  Do not bring valuables to the hospital.    [facility] is not responsible for any belongings or valuables.               Contacts, dentures or bridgework may not be worn into surgery.  Leave your suitcase in the car. After surgery it may be brought to your room.  For patients admitted to the hospital, discharge time is determined by your   treatment team                 Patients discharged the day of surgery will not be allowed to drive home.  You will need someone to drive you home and stay with you the night of your procedure.    Please read over the following fact sheets that you were given:   [facility] Preparing for Surgery and or MRSA Information   TAKE THE FOLLOWING MEDICATIONS THE MORNING OF SURGERY WITH A SIP OF WATER  1. AMLODIPINE  2.  AMIDARONE  3. ATORVASTATIN  4.  5.  6.  ____Fleets enema or Magnesium Citrate as directed.   ___ Use CHG Soap or sage wipes as directed on instruction sheet   _X___ Use inhalers on the day of surgery and bring to hospital day of surgery (USE ALBUTEROL INHALER THE MORNING OF SURGERY AND [place] )  ____ Stop Metformin and Janumet 2 days prior to surgery.    ____ Take 1/2 of usual insulin dose the night before surgery and none on the morning  surgery  _x___ Follow recommendations from Cardiologist, Pulmonologist or PCP regarding          stopping Aspirin, Coumadin, Plavix ,Eliquis, Effient, or Pradaxa, and Pletal. ([place]ade EAR NOSE AND THROAT TO CALL PATIENT TO INFORM WHEN TO STOP ELIQUIS )  X____Stop Anti-inflammatories such as Advil, Aleve, Ibuprofen, Motrin, Naproxen, Naprosyn, Goodies powders or aspirin products. OK to take Tylenol    _x___ Stop supplements until after surgery.  But may continue Vitamin D, Vitamin B,and multivitamin        ____ Bring C-Pap to the hospital.

## 2016-12-01 DIAGNOSIS — I4891 Unspecified atrial fibrillation: Secondary | ICD-10-CM | POA: Diagnosis not present

## 2016-12-01 DIAGNOSIS — Z950 Presence of cardiac pacemaker: Secondary | ICD-10-CM | POA: Diagnosis not present

## 2016-12-01 DIAGNOSIS — I509 Heart failure, unspecified: Secondary | ICD-10-CM | POA: Diagnosis not present

## 2016-12-01 DIAGNOSIS — I10 Essential (primary) hypertension: Secondary | ICD-10-CM | POA: Diagnosis not present

## 2016-12-01 DIAGNOSIS — Z8679 Personal history of other diseases of the circulatory system: Secondary | ICD-10-CM | POA: Diagnosis not present

## 2016-12-01 NOTE — Pre-Procedure Instructions (Signed)
Pulmonary clearance received from Dr. Raul Del and placed on chart.

## 2016-12-01 NOTE — Pre-Procedure Instructions (Signed)
Attempted to fax Perioperative cardiac device programming sheet to Dr. Lamonte Sakai office numerous times and continue to receive fax failed. Called office and requested a different fax number and was told there is not another fax number and to" continue to try to fax the fax machine is very busy. "

## 2016-12-01 NOTE — Pre-Procedure Instructions (Signed)
Cardiac clearance received from [facility] and patient cleared at low risk.

## 2016-12-10 ENCOUNTER — Inpatient Hospital Stay: Payer: PPO | Admitting: Anesthesiology

## 2016-12-10 ENCOUNTER — Other Ambulatory Visit: Payer: Self-pay | Admitting: Otolaryngology

## 2016-12-10 ENCOUNTER — Inpatient Hospital Stay
Admission: RE | Admit: 2016-12-10 | Discharge: 2016-12-12 | DRG: 130 | Disposition: A | Discharge status: Home / Self Care | Payer: PPO | Source: Ambulatory Visit | Attending: Otolaryngology | Admitting: Otolaryngology

## 2016-12-10 ENCOUNTER — Encounter: Payer: Self-pay | Admitting: *Deleted

## 2016-12-10 ENCOUNTER — Encounter
Admission: RE | Disposition: A | Discharge status: Home / Self Care | Payer: Self-pay | Source: Ambulatory Visit | Attending: Otolaryngology

## 2016-12-10 DIAGNOSIS — E119 Type 2 diabetes mellitus without complications: Secondary | ICD-10-CM | POA: Diagnosis not present

## 2016-12-10 DIAGNOSIS — Z7951 Long term (current) use of inhaled steroids: Secondary | ICD-10-CM

## 2016-12-10 DIAGNOSIS — R221 Localized swelling, mass and lump, neck: Secondary | ICD-10-CM | POA: Diagnosis not present

## 2016-12-10 DIAGNOSIS — K219 Gastro-esophageal reflux disease without esophagitis: Secondary | ICD-10-CM | POA: Diagnosis present

## 2016-12-10 DIAGNOSIS — N189 Chronic kidney disease, unspecified: Secondary | ICD-10-CM | POA: Diagnosis not present

## 2016-12-10 DIAGNOSIS — Z79899 Other long term (current) drug therapy: Secondary | ICD-10-CM | POA: Diagnosis not present

## 2016-12-10 DIAGNOSIS — E0590 Thyrotoxicosis, unspecified without thyrotoxic crisis or storm: Secondary | ICD-10-CM | POA: Diagnosis not present

## 2016-12-10 DIAGNOSIS — C07 Malignant neoplasm of parotid gland: Secondary | ICD-10-CM | POA: Diagnosis present

## 2016-12-10 DIAGNOSIS — IMO0002 Reserved for concepts with insufficient information to code with codable children: Secondary | ICD-10-CM

## 2016-12-10 DIAGNOSIS — Z85828 Personal history of other malignant neoplasm of skin: Secondary | ICD-10-CM

## 2016-12-10 DIAGNOSIS — I2510 Atherosclerotic heart disease of native coronary artery without angina pectoris: Secondary | ICD-10-CM | POA: Diagnosis not present

## 2016-12-10 DIAGNOSIS — I10 Essential (primary) hypertension: Secondary | ICD-10-CM | POA: Diagnosis not present

## 2016-12-10 DIAGNOSIS — C770 Secondary and unspecified malignant neoplasm of lymph nodes of head, face and neck: Secondary | ICD-10-CM | POA: Diagnosis not present

## 2016-12-10 DIAGNOSIS — Z7901 Long term (current) use of anticoagulants: Secondary | ICD-10-CM

## 2016-12-10 DIAGNOSIS — C7989 Secondary malignant neoplasm of other specified sites: Secondary | ICD-10-CM | POA: Diagnosis not present

## 2016-12-10 DIAGNOSIS — J449 Chronic obstructive pulmonary disease, unspecified: Secondary | ICD-10-CM | POA: Diagnosis present

## 2016-12-10 DIAGNOSIS — Z7984 Long term (current) use of oral hypoglycemic drugs: Secondary | ICD-10-CM

## 2016-12-10 HISTORY — PX: PAROTIDECTOMY: SHX2163

## 2016-12-10 HISTORY — PX: RADICAL NECK DISSECTION: SHX2284

## 2016-12-10 LAB — CBC
HEMATOCRIT: 35.2 % (ref 35.0–47.0)
HEMOGLOBIN: 11.8 g/dL — AB (ref 12.0–16.0)
MCH: 30.6 pg (ref 26.0–34.0)
MCHC: 33.4 g/dL (ref 32.0–36.0)
MCV: 91.5 fL (ref 80.0–100.0)
Platelets: 197 10*3/uL (ref 150–440)
RBC: 3.85 MIL/uL (ref 3.80–5.20)
RDW: 13.7 % (ref 11.5–14.5)
WBC: 10 10*3/uL (ref 3.6–11.0)

## 2016-12-10 LAB — GLUCOSE, CAPILLARY
GLUCOSE-CAPILLARY: 179 mg/dL — AB (ref 65–99)
GLUCOSE-CAPILLARY: 252 mg/dL — AB (ref 65–99)
GLUCOSE-CAPILLARY: 190 mg/dL — AB (ref 65–99)
Glucose-Capillary: 195 mg/dL — ABNORMAL HIGH (ref 65–99)

## 2016-12-10 LAB — CREATININE, SERUM
Creatinine, Ser: 0.99 mg/dL (ref 0.44–1.00)
GFR calc non Af Amer: 48 mL/min — ABNORMAL LOW (ref >60)
GFR, EST AFRICAN AMERICAN: 55 mL/min — AB (ref >60)

## 2016-12-10 LAB — SURGICAL PATHOLOGY

## 2016-12-10 SURGERY — EXCISION, PAROTID GLAND
Anesthesia: General | Laterality: Left | Wound class: Clean

## 2016-12-10 MED ORDER — LIDOCAINE-EPINEPHRINE (PF) 1 %-1:200000 IJ SOLN
INTRAMUSCULAR | Status: DC | PRN
  Administered 2016-12-10: 8 mL

## 2016-12-10 MED ORDER — ROCURONIUM BROMIDE 50 MG/5ML IV SOLN
INTRAVENOUS | Status: AC
  Filled 2016-12-10: qty 1

## 2016-12-10 MED ORDER — PROPOFOL 10 MG/ML IV BOLUS
INTRAVENOUS | Status: DC | PRN
  Administered 2016-12-10: 80 mg via INTRAVENOUS
  Administered 2016-12-10 (×2): 20 mg via INTRAVENOUS

## 2016-12-10 MED ORDER — SUCCINYLCHOLINE CHLORIDE 20 MG/ML IJ SOLN
INTRAMUSCULAR | Status: AC
  Filled 2016-12-10: qty 1

## 2016-12-10 MED ORDER — SODIUM CHLORIDE 0.9 % IV SOLN
INTRAVENOUS | Status: DC
  Administered 2016-12-10 (×2): via INTRAVENOUS

## 2016-12-10 MED ORDER — SODIUM CHLORIDE 0.9 % IV SOLN
INTRAVENOUS | Status: DC | PRN
  Administered 2016-12-10: 0.0125 ug/kg/min via INTRAVENOUS

## 2016-12-10 MED ORDER — LIDOCAINE HCL (PF) 2 % IJ SOLN
INTRAMUSCULAR | Status: AC
  Filled 2016-12-10: qty 2

## 2016-12-10 MED ORDER — FENTANYL CITRATE (PF) 100 MCG/2ML IJ SOLN
INTRAMUSCULAR | Status: AC
  Filled 2016-12-10: qty 2

## 2016-12-10 MED ORDER — IPRATROPIUM-ALBUTEROL 0.5-2.5 (3) MG/3ML IN SOLN: 3.0000 mL | Freq: Four times a day (QID) | RESPIRATORY_TRACT | Status: DC | PRN

## 2016-12-10 MED ORDER — CLINDAMYCIN PHOSPHATE 900 MG/50ML IV SOLN
900.0000 mg | Freq: Once | INTRAVENOUS | Status: AC
  Administered 2016-12-10: 900 mg via INTRAVENOUS

## 2016-12-10 MED ORDER — FENTANYL CITRATE (PF) 100 MCG/2ML IJ SOLN
25.0000 ug | INTRAMUSCULAR | Status: DC | PRN
  Administered 2016-12-10 (×2): 25 ug via INTRAVENOUS

## 2016-12-10 MED ORDER — HEPARIN SODIUM (PORCINE) 5000 UNIT/ML IJ SOLN
5000.0000 [IU] | Freq: Three times a day (TID) | INTRAMUSCULAR | Status: DC
  Administered 2016-12-10 – 2016-12-12 (×6): 5000 [IU] via SUBCUTANEOUS
  Filled 2016-12-10 (×6): qty 1

## 2016-12-10 MED ORDER — FENTANYL CITRATE (PF) 100 MCG/2ML IJ SOLN
INTRAMUSCULAR | Status: AC
  Administered 2016-12-10: 25 ug via INTRAVENOUS
  Filled 2016-12-10: qty 2

## 2016-12-10 MED ORDER — LIDOCAINE-EPINEPHRINE 0.5 %-1:200000 IJ SOLN
INTRAMUSCULAR | Status: AC
  Filled 2016-12-10: qty 1

## 2016-12-10 MED ORDER — AMIODARONE HCL 200 MG PO TABS
200.0000 mg | ORAL_TABLET | Freq: Every day | ORAL | Status: DC
  Administered 2016-12-11 – 2016-12-12 (×2): 200 mg via ORAL
  Filled 2016-12-10 (×2): qty 1

## 2016-12-10 MED ORDER — DOCUSATE SODIUM 100 MG PO CAPS
100.0000 mg | ORAL_CAPSULE | Freq: Two times a day (BID) | ORAL | Status: DC
  Administered 2016-12-10 – 2016-12-12 (×4): 100 mg via ORAL
  Filled 2016-12-10 (×4): qty 1

## 2016-12-10 MED ORDER — PHENYLEPHRINE HCL 10 MG/ML IJ SOLN
INTRAMUSCULAR | Status: DC | PRN
Start: 2016-12-10 — End: 2016-12-10
  Administered 2016-12-10 (×14): 100 ug via INTRAVENOUS

## 2016-12-10 MED ORDER — ONDANSETRON HCL 4 MG PO TABS: 4.0000 mg | ORAL_TABLET | ORAL | Status: DC | PRN

## 2016-12-10 MED ORDER — ATORVASTATIN CALCIUM 20 MG PO TABS
20.0000 mg | ORAL_TABLET | Freq: Every day | ORAL | Status: DC
  Administered 2016-12-11 – 2016-12-12 (×2): 20 mg via ORAL
  Filled 2016-12-10 (×2): qty 1

## 2016-12-10 MED ORDER — AMLODIPINE BESYLATE 5 MG PO TABS
5.0000 mg | ORAL_TABLET | Freq: Every day | ORAL | Status: DC
  Administered 2016-12-11 – 2016-12-12 (×2): 5 mg via ORAL
  Filled 2016-12-10 (×2): qty 1

## 2016-12-10 MED ORDER — MORPHINE SULFATE (PF) 2 MG/ML IV SOLN: 2.0000 mg | INTRAVENOUS | Status: DC | PRN

## 2016-12-10 MED ORDER — DEXAMETHASONE SODIUM PHOSPHATE 10 MG/ML IJ SOLN
INTRAMUSCULAR | Status: DC | PRN
  Administered 2016-12-10: 5 mg via INTRAVENOUS

## 2016-12-10 MED ORDER — PROPOFOL 10 MG/ML IV BOLUS
INTRAVENOUS | Status: AC
  Filled 2016-12-10: qty 20

## 2016-12-10 MED ORDER — EPHEDRINE SULFATE 50 MG/ML IJ SOLN
INTRAMUSCULAR | Status: AC
  Filled 2016-12-10: qty 1

## 2016-12-10 MED ORDER — GLIMEPIRIDE 2 MG PO TABS
2.0000 mg | ORAL_TABLET | Freq: Every day | ORAL | Status: DC
  Administered 2016-12-11 – 2016-12-12 (×2): 2 mg via ORAL
  Filled 2016-12-10 (×2): qty 1

## 2016-12-10 MED ORDER — FAMOTIDINE 20 MG PO TABS
ORAL_TABLET | ORAL | Status: AC
  Administered 2016-12-10: 20 mg via ORAL
  Filled 2016-12-10: qty 1

## 2016-12-10 MED ORDER — LATANOPROST 0.005 % OP SOLN
1.0000 [drp] | Freq: Every day | OPHTHALMIC | Status: DC
  Administered 2016-12-10 – 2016-12-11 (×2): 1 [drp] via OPHTHALMIC
  Filled 2016-12-10: qty 2.5

## 2016-12-10 MED ORDER — PHENYLEPHRINE HCL 10 MG/ML IJ SOLN
INTRAMUSCULAR | Status: AC
  Filled 2016-12-10: qty 1

## 2016-12-10 MED ORDER — ROCURONIUM BROMIDE 100 MG/10ML IV SOLN
INTRAVENOUS | Status: DC | PRN
  Administered 2016-12-10: 5 mg via INTRAVENOUS

## 2016-12-10 MED ORDER — INSULIN ASPART 100 UNIT/ML ~~LOC~~ SOLN
0.0000 [IU] | Freq: Three times a day (TID) | SUBCUTANEOUS | Status: DC
  Administered 2016-12-10: 5 [IU] via SUBCUTANEOUS
  Administered 2016-12-11 – 2016-12-12 (×3): 1 [IU] via SUBCUTANEOUS
  Filled 2016-12-10 (×4): qty 1

## 2016-12-10 MED ORDER — LIDOCAINE-EPINEPHRINE (PF) 1 %-1:200000 IJ SOLN
INTRAMUSCULAR | Status: AC
  Filled 2016-12-10: qty 30

## 2016-12-10 MED ORDER — BACITRACIN ZINC 500 UNIT/GM EX OINT
TOPICAL_OINTMENT | CUTANEOUS | Status: AC
  Filled 2016-12-10: qty 28.35

## 2016-12-10 MED ORDER — ONDANSETRON HCL 4 MG/2ML IJ SOLN: 4.0000 mg | INTRAMUSCULAR | Status: DC | PRN

## 2016-12-10 MED ORDER — REMIFENTANIL HCL 1 MG IV SOLR
INTRAVENOUS | Status: AC
  Filled 2016-12-10: qty 1000

## 2016-12-10 MED ORDER — CLINDAMYCIN PHOSPHATE 900 MG/50ML IV SOLN
INTRAVENOUS | Status: AC
  Filled 2016-12-10: qty 50

## 2016-12-10 MED ORDER — FENTANYL CITRATE (PF) 100 MCG/2ML IJ SOLN
INTRAMUSCULAR | Status: DC | PRN
  Administered 2016-12-10: 25 ug via INTRAVENOUS

## 2016-12-10 MED ORDER — POTASSIUM CHLORIDE IN NACL 20-0.9 MEQ/L-% IV SOLN
INTRAVENOUS | Status: DC
  Administered 2016-12-10 – 2016-12-11 (×2): via INTRAVENOUS
  Filled 2016-12-10 (×4): qty 1000

## 2016-12-10 MED ORDER — LIDOCAINE HCL (CARDIAC) 20 MG/ML IV SOLN
INTRAVENOUS | Status: DC | PRN
  Administered 2016-12-10: 60 mg via INTRAVENOUS

## 2016-12-10 MED ORDER — SUCCINYLCHOLINE CHLORIDE 20 MG/ML IJ SOLN
INTRAMUSCULAR | Status: DC | PRN
Start: 2016-12-10 — End: 2016-12-10
  Administered 2016-12-10: 80 mg via INTRAVENOUS

## 2016-12-10 MED ORDER — FAMOTIDINE 20 MG PO TABS
20.0000 mg | ORAL_TABLET | Freq: Once | ORAL | Status: AC
  Administered 2016-12-10: 20 mg via ORAL

## 2016-12-10 MED ORDER — BACITRACIN 500 UNIT/GM EX OINT
1.0000 "application " | TOPICAL_OINTMENT | Freq: Three times a day (TID) | CUTANEOUS | Status: DC
  Administered 2016-12-10 – 2016-12-12 (×7): 1 via TOPICAL
  Filled 2016-12-10 (×7): qty 0.9

## 2016-12-10 MED ORDER — ONDANSETRON HCL 4 MG/2ML IJ SOLN
4.0000 mg | Freq: Once | INTRAMUSCULAR | Status: AC | PRN
  Administered 2016-12-10: 4 mg via INTRAVENOUS

## 2016-12-10 MED ORDER — BRIMONIDINE TARTRATE 0.2 % OP SOLN
1.0000 [drp] | Freq: Two times a day (BID) | OPHTHALMIC | Status: DC
  Administered 2016-12-10 – 2016-12-12 (×4): 1 [drp] via OPHTHALMIC
  Filled 2016-12-10: qty 5

## 2016-12-10 MED ORDER — HYDROCODONE-ACETAMINOPHEN 5-325 MG PO TABS: 1.0000 | ORAL_TABLET | Freq: Four times a day (QID) | ORAL | Status: DC | PRN

## 2016-12-10 SURGICAL SUPPLY — 44 items
ADHESIVE MASTISOL STRL (MISCELLANEOUS) ×3 IMPLANT
BLADE SURG 15 STRL LF DISP TIS (BLADE) ×1 IMPLANT
BLADE SURG 15 STRL SS (BLADE) ×2
CANISTER SUCT 1200ML W/VALVE (MISCELLANEOUS) ×3 IMPLANT
CORD BIP STRL DISP 12FT (MISCELLANEOUS) ×3 IMPLANT
DRAIN TLS ROUND 10FR (DRAIN) ×3 IMPLANT
DRAPE MAG INST 16X20 L/F (DRAPES) ×3 IMPLANT
DRAPE SURG 17X11 SM STRL (DRAPES) ×6 IMPLANT
DRSG TEGADERM 2-3/8X2-3/4 SM (GAUZE/BANDAGES/DRESSINGS) IMPLANT
DRSG TEGADERM 4X4.75 (GAUZE/BANDAGES/DRESSINGS) ×3 IMPLANT
DRSG TELFA 4X3 1S NADH ST (GAUZE/BANDAGES/DRESSINGS) ×3 IMPLANT
ELECT EMG 20MM DUAL (MISCELLANEOUS) ×3
ELECT NEEDLE 20X.3 GREEN (MISCELLANEOUS) ×3
ELECT REM PT RETURN 9FT ADLT (ELECTROSURGICAL) ×3
ELECTRODE EMG 20MM DUAL (MISCELLANEOUS) ×1 IMPLANT
ELECTRODE NEEDLE 20X.3 GREEN (MISCELLANEOUS) ×1 IMPLANT
ELECTRODE REM PT RTRN 9FT ADLT (ELECTROSURGICAL) ×1 IMPLANT
FORCEPS JEWEL BIP 4-3/4 STR (INSTRUMENTS) ×3 IMPLANT
GAUZE SPONGE 4X4 12PLY STRL (GAUZE/BANDAGES/DRESSINGS) ×3 IMPLANT
GLOVE BIO SURGEON STRL SZ7.5 (GLOVE) ×6 IMPLANT
GOWN STRL REUS W/ TWL LRG LVL3 (GOWN DISPOSABLE) ×3 IMPLANT
GOWN STRL REUS W/TWL LRG LVL3 (GOWN DISPOSABLE) ×6
HOOK STAY BLUNT/RETRACTOR 5M (MISCELLANEOUS) ×3 IMPLANT
JACKSON PRATT 10 (INSTRUMENTS) ×3 IMPLANT
KIT RM TURNOVER STRD PROC AR (KITS) ×3 IMPLANT
LABEL OR SOLS (LABEL) ×3 IMPLANT
MARKER SKIN DUAL TIP RULER LAB (MISCELLANEOUS) ×3 IMPLANT
MARKER SKIN W/RULER 31145785 (MISCELLANEOUS) ×3 IMPLANT
NS IRRIG 500ML POUR BTL (IV SOLUTION) ×3 IMPLANT
PACK HEAD/NECK (MISCELLANEOUS) ×3 IMPLANT
PROBE MONO 100X0.75 ELECT 1.9M (MISCELLANEOUS) ×6 IMPLANT
SHEARS HARMONIC 9CM CVD (BLADE) ×3 IMPLANT
SPONGE KITTNER 5P (MISCELLANEOUS) ×6 IMPLANT
SPONGE XRAY 4X4 16PLY STRL (MISCELLANEOUS) ×6 IMPLANT
STAPLER SKIN PROX 35W (STAPLE) ×3 IMPLANT
SUT PROLENE 5 0 PS 3 (SUTURE) ×3 IMPLANT
SUT SILK 2 0 (SUTURE) ×2
SUT SILK 2-0 18XBRD TIE 12 (SUTURE) ×1 IMPLANT
SUT SILK 3 0 (SUTURE)
SUT SILK 3-0 18XBRD TIE 12 (SUTURE) IMPLANT
SUT SILK 4 0 (SUTURE)
SUT SILK 4-0 18XBRD TIE 12 (SUTURE) IMPLANT
SUT VIC AB 4-0 RB1 18 (SUTURE) ×3 IMPLANT
SYSTEM CHEST DRAIN TLS 7FR (DRAIN) IMPLANT

## 2016-12-10 NOTE — Anesthesia Preprocedure Evaluation (Signed)
Anesthesia Evaluation  Patient identified by MRN, date of birth, ID band Patient awake    Reviewed: Allergy & Precautions, NPO status , Patient's Chart, lab work & pertinent test results  History of Anesthesia Complications Negative for: history of anesthetic complications  Airway Mallampati: III       Dental  (+) Missing, Chipped   Pulmonary asthma , sleep apnea , COPD,  COPD inhaler,           Cardiovascular hypertension, Pt. on medications + CAD  + pacemaker      Neuro/Psych    GI/Hepatic Neg liver ROS, GERD  Medicated and Controlled,  Endo/Other  diabetes, Type 2, Oral Hypoglycemic AgentsHyperthyroidism   Renal/GU Renal InsufficiencyRenal disease     Musculoskeletal   Abdominal   Peds  Hematology   Anesthesia Other Findings   Reproductive/Obstetrics                             Anesthesia Physical Anesthesia Plan  ASA: III  Anesthesia Plan: General   Post-op Pain Management:    Induction:   PONV Risk Score and Plan: 3 and Ondansetron, Dexamethasone and Midazolam  Airway Management Planned:   Additional Equipment:   Intra-op Plan:   Post-operative Plan:   Informed Consent: I have reviewed the patients History and Physical, chart, labs and discussed the procedure including the risks, benefits and alternatives for the proposed anesthesia with the patient or authorized representative who has indicated his/her understanding and acceptance.     Plan Discussed with:   Anesthesia Plan Comments:         Anesthesia Quick Evaluation

## 2016-12-10 NOTE — Anesthesia Postprocedure Evaluation (Signed)
Anesthesia Post Note  Patient: Sarah Dougherty  Procedure(s) Performed: Procedure(s) (LRB): PAROTIDECTOMY (Left) RADICAL NECK DISSECTION (Left)  Patient location during evaluation: PACU Anesthesia Type: General Level of consciousness: awake and alert Pain management: pain level controlled Vital Signs Assessment: post-procedure vital signs reviewed and stable Respiratory status: spontaneous breathing and respiratory function stable Cardiovascular status: stable Anesthetic complications: no     Last Vitals:  Vitals:   12/10/16 1155 12/10/16 1204  BP: 129/70 123/63  Pulse:  (!) 59  Resp:  (!) 23  Temp: 37.1 C   SpO2:  100%    Last Pain:  Vitals:   12/10/16 1204  TempSrc:   PainSc: 0-No pain                 KEPHART,WILLIAM K

## 2016-12-10 NOTE — H&P (Signed)
History and physical reviewed and will be scanned in later. No change in medical status reported by the patient or family, appears stable for surgery. All questions regarding the procedure answered, and patient (or family if a child) expressed understanding of the procedure.  Sarah Dougherty S @TODAY@ 

## 2016-12-10 NOTE — Anesthesia Procedure Notes (Signed)
Procedure Name: Intubation Date/Time: 12/10/2016 7:32 AM Performed by: Allean Found Pre-anesthesia Checklist: Patient identified, Emergency Drugs available, Suction available, Patient being monitored and Timeout performed Patient Re-evaluated:Patient Re-evaluated prior to induction Oxygen Delivery Method: Circle system utilized Preoxygenation: Pre-oxygenation with 100% oxygen Induction Type: IV induction Ventilation: Mask ventilation without difficulty and Oral airway inserted - appropriate to patient size Laryngoscope Size: Mac and 3 Grade View: Grade II Tube type: Oral Tube size: 7.0 mm Number of attempts: 1 Airway Equipment and Method: Stylet Placement Confirmation: ETT inserted through vocal cords under direct vision,  positive ETCO2 and breath sounds checked- equal and bilateral Secured at: 20 cm Tube secured with: Tape Dental Injury: Teeth and Oropharynx as per pre-operative assessment

## 2016-12-10 NOTE — Progress Notes (Signed)
Sarah Dougherty, Sarah Dougherty [id number] January 13, 1922 [name], MD   SUBJECTIVE: This 83 y.o. year old female is status post Lastrup. Doing well postop with minimal pain. Feels like she can eat.   Medications:  Current Facility-Administered Medications  Medication Dose Route Frequency Provider Last Rate Last Dose  . 0.9 % NaCl with KCl 20 mEq/ L  infusion   Intravenous Continuous Clyde Canterbury, MD 75 mL/hr at 12/10/16 1426    . amiodarone (PACERONE) tablet 200 mg  200 mg Oral Daily Clyde Canterbury, MD      . amLODipine (NORVASC) tablet 5 mg  5 mg Oral Daily Clyde Canterbury, MD      . Derrill Memo ON 12/11/2016] atorvastatin (LIPITOR) tablet 20 mg  20 mg Oral q1800 Clyde Canterbury, MD      . bacitracin ointment 1 application  1 application Topical P7T Clyde Canterbury, MD   1 application at 10/07/92 1605  . brimonidine (ALPHAGAN) 0.2 % ophthalmic solution 1 drop  1 drop Left Eye BID Clyde Canterbury, MD      . docusate sodium (COLACE) capsule 100 mg  100 mg Oral BID Clyde Canterbury, MD      . Derrill Memo ON 12/11/2016] glimepiride (AMARYL) tablet 2 mg  2 mg Oral Q breakfast Clyde Canterbury, MD      . heparin injection 5,000 Units  5,000 Units Subcutaneous Andee Poles, MD   5,000 Units at 12/10/16 1426  . HYDROcodone-acetaminophen (NORCO/VICODIN) 5-325 MG per tablet 1-2 tablet  1-2 tablet Oral Q6H PRN Clyde Canterbury, MD      . insulin aspart (novoLOG) injection 0-9 Units  0-9 Units Subcutaneous TID WC Clyde Canterbury, MD      . ipratropium-albuterol (DUONEB) 0.5-2.5 (3) MG/3ML nebulizer solution 3 mL  3 mL Nebulization Q6H PRN Clyde Canterbury, MD      . latanoprost (XALATAN) 0.005 % ophthalmic solution 1 drop  1 drop Left Eye Leodis Binet, MD      . morphine 2 MG/ML injection 2-4 mg  2-4 mg Intravenous Q2H PRN Clyde Canterbury, MD      . ondansetron [facility]) tablet 4 mg  4 mg Oral Q4H PRN Clyde Canterbury, MD       Or  . ondansetron [facility]) injection 4 mg  4 mg Intravenous Q4H PRN Clyde Canterbury, MD       .  Medications Prior to Admission  Medication Sig Dispense Refill  . acetaminophen (TYLENOL 8 HOUR) 650 MG CR tablet Take 650 mg by mouth every 8 (eight) hours as needed for pain.    Marland Kitchen albuterol (PROAIR HFA) 108 (90 Base) MCG/ACT inhaler Inhale 1 puff into the lungs every 6 (six) hours as needed for wheezing or shortness of breath.    Marland Kitchen amiodarone (PACERONE) 200 MG tablet Take 100 mg by mouth daily.    Marland Kitchen amLODipine (NORVASC) 5 MG tablet Take 5 mg by mouth daily.    Marland Kitchen apixaban (ELIQUIS) 2.5 MG TABS tablet Take 2.5 mg by mouth 2 (two) times daily.    Marland Kitchen atorvastatin (LIPITOR) 20 MG tablet Take 20 mg by mouth daily.    . brimonidine (ALPHAGAN) 0.2 % ophthalmic solution Place 1 drop into the left eye 2 (two) times daily.    . Cholecalciferol (VITAMIN D) 2000 units tablet Take 2,000 Units by mouth daily.    . clotrimazole (GYNE-LOTRIMIN) 1 % vaginal cream Place 1 application vaginally daily as needed. For yeast infection.    Marland Kitchen glimepiride (AMARYL) 2 MG tablet Take 2 mg  by mouth daily.    . hydrocortisone cream (NEOSPORIN ECZEMA ESSENT MAX ST) 1 % Apply 1 application topically 2 (two) times daily as needed for itching (for sores.).    Marland Kitchen sodium chloride (OCEAN) 0.65 % nasal spray Place 2 sprays into the nose daily as needed.    . travoprost, benzalkonium, (TRAVATAN) 0.004 % ophthalmic solution Place 1 drop into the left eye at bedtime.    Marland Kitchen ipratropium-albuterol (DUONEB) 0.5-2.5 (3) MG/3ML SOLN Take 3 mLs by nebulization every 4 (four) hours as needed. (Patient not taking: Reported on 12/01/2016) 360 mL 0  . Rivaroxaban (XARELTO PO) Take 1 tablet by mouth 2 (two) times daily.     Marland Kitchen tiotropium (SPIRIVA) 18 MCG inhalation capsule Place 1 capsule (18 mcg total) into inhaler and inhale daily. (Patient not taking: Reported on 11/28/2016) 30 capsule 12    OBJECTIVE:  PHYSICAL EXAM  Vitals: Blood pressure 114/61, pulse 60, temperature 98.7 F (37.1 C), temperature source Oral, resp. rate 18, height _0   (1.549 m), weight 117 lb 8.1 oz (53.3 kg), SpO2 95 %.. General: Thin elderly female in no acute distress Mood: Mood and affect well adjusted, pleasant and cooperative. Orientation: Grossly alert and oriented. Vocal Quality: No hoarseness. Communicates verbally. head and Face: NCAT. No facial asymmetry. No visible skin lesions. No significant facial scars. No tenderness with sinus percussion. Facial strength normal except at most mild marginal weakness Neck: Supple and symmetric with no palpable masses, tenderness or crepitance. The trachea is midline. Thyroid gland is soft, nontender and symmetric with no masses or enlargement. Neck wound looks good. No swelling, or hematoma. Drain has sero-sanguinous material. Respiratory: Normal respiratory effort without labored breathing.  MEDICAL DECISION MAKING: Data Review:  Results for orders placed or performed during the hospital encounter of 12/10/16 (from the past 48 hour(s))  Glucose, capillary     Status: Abnormal   Collection Time: 12/10/16  6:13 AM  Result Value Ref Range   Glucose-Capillary 179 (H) 65 - 99 mg/dL  Glucose, capillary     Status: Abnormal   Collection Time: 12/10/16 11:53 AM  Result Value Ref Range   Glucose-Capillary 195 (H) 65 - 99 mg/dL  CBC     Status: Abnormal   Collection Time: 12/10/16  1:55 PM  Result Value Ref Range   WBC 10.0 3.6 - 11.0 K/uL   RBC 3.85 3.80 - 5.20 MIL/uL   Hemoglobin 11.8 (L) 12.0 - 16.0 g/dL   HCT 35.2 35.0 - 47.0 %   MCV 91.5 80.0 - 100.0 fL   MCH 30.6 26.0 - 34.0 pg   MCHC 33.4 32.0 - 36.0 g/dL   RDW 13.7 11.5 - 14.5 %   Platelets 197 150 - 440 K/uL  Creatinine, serum     Status: Abnormal   Collection Time: 12/10/16  1:55 PM  Result Value Ref Range   Creatinine, Ser 0.99 0.44 - 1.00 mg/dL   GFR calc non Af Amer 48 (L) >60 mL/min   GFR calc Af Amer 55 (L) >60 mL/min    Comment: (NOTE) The eGFR has been calculated using the CKD EPI equation. This calculation has not been validated in  all clinical situations. eGFR's persistently <60 mL/min signify possible Chronic Kidney Disease.   . No results found..   ASSESSMENT: Doing well after left superficial parotidectomy, modified radical neck dissection  PLAN: Advance diet, OOB to chair. Continue heparin and SCD's.   [name], MD 12/10/2016 4:46 PM

## 2016-12-10 NOTE — Anesthesia Post-op Follow-up Note (Signed)
Anesthesia QCDR form completed.        

## 2016-12-10 NOTE — Op Note (Signed)
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  Anesthesia:  General endotracheal anesthesia  EBL:  Less than 25 cc  Complications:  None  Findings: Firm 2 cm mass in the left tail of parotid. Small nodes within the lymph node dissection specimen, largest node in level I behind the submandibular gland  Procedure: The patient was taken to the Operating Room and placed in the supine position.  After induction of general endotracheal anesthesia, the patient was turned 90 degrees and placed on a shoulder roll. The skin was injected along the proposed incision line over the left parotid region and neck with 1% lidocaine with epinephrine, 1: 200,000. The facial nerve monitor electrodes were placed in the usual fashion at the brow and lower lip on the same side of the procedure. Proper functioning of the nerve monitor was assessed. The area was then prepped and draped in the usual sterile fashion.  A 15 blade was then used to incise the skin, starting in front of the tragus, and curving below the ear lobe into the neck in a gentle S shaped incision, extending down into the neck over the anterior border of the sternocleidomastoid muscle. A 1 cm x 3 cm segment of skin at the base of the earlobe was resected where Dr. Tollie Pizza noted there had been some carcinoma in situ at the prior margin of her skin cancer resection. There was no gross lesion notable however. This was sent as a separate specimen. The dissection was carried down to the subcutaneous tissues with the Bovie, exposing the sternocleidomastoid muscle in the neck. A cervicofacial skin flap was then elevated with scissors, superficial to the parotid fascia, and  anteriorly just beyond anterior border of the mass. The flap was then retracted anteriorly with surgical stays. Dissection then proceeded along the tragal cartilage and more inferiorly, separating the parotid gland from the mastoid tip and the sternocleidomastoid muscle. Dissection anterior to the SCM identified the digastric muscle for anatomic reference. The greater auricular nerve required division for adequate exposure for the procedure. During dissection tissues were divided with either the bipolar cautery or the harmonic scalpel, utilizing the latter instrument when dividing larger blood vessels for hemostasis. Care was taken not to violate the capsule of the parotid mass. In this fashion wide exposure was obtained, which facilitated exposure and identification of the facial nerve at the stylomastoid foramen. This was confirmed using the nerve stimulator on a low setting. Careful dissection then proceeded along the trunk of the facial nerve, and subsequently along the branches of the nerve, dissecting the superficial portion of the parotid gland, with the associated mass, away from the branches of the facial nerve. The face was monitored for movement throughout the procedure, and tissues carefully divided with either the bipolar cautery or harmonic scalpel. Care was taken to identify all branches of the facial nerve and preserve any communicating branches. The dissection proceeded anteriorly until the dissection was beyond the anterior margin of the parotid mass. With the facial nerve completley separated from the specimen, the anterior margin of the specimen was divided with the harmonic scalpel.   The specimen was inspected and the mass found completely excised, and sent for pathology.   Next attention was turned  to the neck dissection. The sternocleidomastoid muscle was exposed along its length, separating the fascia from the muscle with the Bovie. Dissection began superiorly, dissecting out the  submandibular gland and associated fat and lymph nodes. Care was taken to identify and preserve the previously dissected marginal nerve during this dissection. The retromandibular vein and facial artery were clamped and suture ligated during this dissection. The gland was dissected out, dividing the branch from the lingual nerve, but protecting the lingual nerve itself during the dissection. The hypoglossal was identified deep to the gland and left undisturbed. Gland was dissected up to the posterior border of the mylohyoid, then dissecting under the mylohyoid and dividing the duct with the Harmonic scalpel. The gland was then swept inferiorly over the digastric muscle, and fascial attachments to the muscle divided. The submental fat and associated lymph nodes were then dissected away from the muscles of the floor of the neck with the Harmonic scalpel and swept inferiorly with the submandibular gland specimen. Dissection proceeded up along the posterior border of the digastric muscle separating it from the remaining parotid tissue. Dissection then proceeded medial to the sternocleidomastoid muscle, dissecting the fat and lymph node bearing tissue from the muscle, and identifying the spinal accessory nerve during this dissection as well as cervical rootlets. The spinal accessory nerve was then traced back up to the posterior aspect of the internal jugular vein and freed up from the specimen. The level IIB specimen above the spinal accessory nerve was dissected from the floor of the neck with the Harmonic scalpel and then swept under the spinal accessory nerve, avoiding injury to the nerve. The specimen was then dissected out of the floor of the neck, working around the cervical rootlets to preserve their function, and dissecting the specimen from level II down to level III where the omohyoid muscle was located. Specimen was dissected medially to the posterior edge of the internal jugular vein, then dissected off of  the vein dividing some of the small branches from the vein during this dissection. Specimen was then carefully dissected off the carotid sheath, avoiding injury to the hypoglossal nerve. The superior laryngeal nerve and superior thyroid artery were identified during this dissection and preserved. Specimen was then dissected more anteriorly off of the strap muscles and delivered and divided into levels 1, 2, and 3. These were sent as separate specimens.  The wound was irrigated with saline and hemostasis obtained. A #10 JP drain was placed through a separate stab incision in the skin posterior to the incision in the neck, and secured with a 4-0 Vicryl suture. Deep closure was performed with 4-0 Vicryl suture to close the dead space from the resection as much as possible. The facial nerve was stimulated and found to stimulate appropriately. The subcutaneous tissues were then closed with 4-0 Vicryl suture in an interrupted fashion. The skin was closed with 5-0 Prolene suture in a running locked stitch in the facial region and upper neck, and staples in the lower neck. Bacitracin ointment was applied to the wound.  The patient was then returned to the anesthesiologist for awakening, and was taken to the Recovery Room in stable condition.  Cultures:  None.  Disposition:   PACU then to the floor   Plan: To floor for admission  [name] 12/10/2016 11:38 AM

## 2016-12-10 NOTE — Transfer of Care (Signed)
Immediate Anesthesia Transfer of Care Note  Patient: Sarah Dougherty  Procedure(s) Performed: Procedure(s): PAROTIDECTOMY (Left) RADICAL NECK DISSECTION (Left)  Patient Location: PACU  Anesthesia Type:General  Level of Consciousness: awake  Airway & Oxygen Therapy: Patient Spontanous Breathing and Patient connected to face mask oxygen  Post-op Assessment: Report given to RN and Post -op Vital signs reviewed and stable  Post vital signs: Reviewed and stable  Last Vitals:  Vitals:   12/10/16 0609  BP: 135/77  Pulse: 78  Resp: 16  Temp: 36.5 C  SpO2: 97%    Last Pain:  Vitals:   12/10/16 0609  TempSrc: Tympanic         Complications: No apparent anesthesia complications

## 2016-12-11 LAB — GLUCOSE, CAPILLARY
GLUCOSE-CAPILLARY: 146 mg/dL — AB (ref 65–99)
GLUCOSE-CAPILLARY: 103 mg/dL — AB (ref 65–99)
GLUCOSE-CAPILLARY: 147 mg/dL — AB (ref 65–99)
Glucose-Capillary: 141 mg/dL — ABNORMAL HIGH (ref 65–99)

## 2016-12-11 MED ORDER — ENSURE ENLIVE PO LIQD
237.0000 mL | Freq: Two times a day (BID) | ORAL | Status: DC
  Administered 2016-12-12: 237 mL via ORAL

## 2016-12-11 MED ORDER — ADULT MULTIVITAMIN LIQUID CH
15.0000 mL | Freq: Every day | ORAL | Status: DC
  Administered 2016-12-11: 15 mL via ORAL
  Filled 2016-12-11 (×2): qty 15

## 2016-12-11 NOTE — Progress Notes (Signed)
Initial Nutrition Assessment  DOCUMENTATION CODES:   Not applicable  INTERVENTION:   Ensure Enlive po BID, each supplement provides 350 kcal and 20 grams of protein  MVI  Magic cup TID with meals, each supplement provides 290 kcal and 9 grams of protein  NUTRITION DIAGNOSIS:   Unintentional weight loss related to  (squamous cell carcinoma, COPD) as evidenced by 10 percent weight loss in 6 months, moderate depletions of muscle mass in temporal regions and BLE.  GOAL:   Patient will meet greater than or equal to 90% of their needs  MONITOR:   PO intake, Supplement acceptance, Labs, Weight trends, Skin  REASON FOR ASSESSMENT:   Diagnosis    ASSESSMENT:   83 y.o. year old female with h/o COPD, DM, CKD III, Barrett's esophagus s/p dilation 2002, dysphagia, osteoarthritis, and current squamous cell carcinoma now s/p  PAROTIDECTOMY RADICAL NECK DISSECTION   Met with pt and pt's son in room today. Pt reports that she is a good eater at home but family reports that pt usually has to be coaxed into eating. Per chart, pt has lost 13lbs(10%) over the past 6 months; this is significant. Pt confirms this wt loss. Pt reports that she has not started drinking any supplements at home but reports that she has to have thickened liquids r/t a high risk of aspiration. Pt nor family member is sure whether these liquids are nectar or honey thick but they report as long as they thicken them with powder, pt does not have any coughing or gagging. Per chart review, pt with a h/o Barrett's esophagus and two dilations (last one in 2002). Spoke to Dr. Pryor Ochoa; ok to order SLP evaluation as pt is on a full liquid diet. Spoke to pt and family member about the importance of adequate protein intake and recommended appropriate consistency supplements at home. RD will order supplements pending SLP evaluation.      Medications reviewed and include: colace, heparin, insulin, NaCl w/ KCL _0 /hr   Labs  reviewed  Nutrition-Focused physical exam completed. Findings are no fat depletion, moderate muscle depletions in temporal regions and BLE, and no edema. Pt with osteoarthritis in bilateral knees.    Diet Order:  Diet full liquid Room service appropriate? Yes; Fluid consistency: Thin  Skin:  Wound (see comment) (Incision neck )  Last BM:  8/30  Height:   Ht Readings from Last 1 Encounters:  12/10/16 _1  (1.549 m)    Weight:   Wt Readings from Last 1 Encounters:  12/10/16 117 lb 8.1 oz (53.3 kg)    Ideal Body Weight:  47.7 kg  BMI:  Body mass index is 22.2 kg/m.  Estimated Nutritional Needs:   Kcal:  1500-1700kcal/day   Protein:  69-80g/day   Fluid:  >1.5L/day   EDUCATION NEEDS:   Education needs addressed  Koleen Distance MS, RD, LDN Pager #[phone] After Hours Pager: [phone]

## 2016-12-11 NOTE — Progress Notes (Signed)
Sarah Dougherty, Sarah Dougherty [id number] 10/03/1921 Sarah Nearing, MD   SUBJECTIVE: This 83 y.o. year old female is status post [place]. Doing well. Some minor agitation last night but appears able to orient easily this AM. Denies pain. Able to tolerate full liquids last night and feels like advancing diet today. Drain had 40 out.   Medications:  Current Facility-Administered Medications  Medication Dose Route Frequency Provider Last Rate Last Dose  . 0.9 % NaCl with KCl 20 mEq/ L  infusion   Intravenous Continuous Clyde Canterbury, MD 75 mL/hr at 12/11/16 [phone]    . amiodarone (PACERONE) tablet 200 mg  200 mg Oral Daily Clyde Canterbury, MD      . amLODipine (NORVASC) tablet 5 mg  5 mg Oral Daily Clyde Canterbury, MD      . atorvastatin (LIPITOR) tablet 20 mg  20 mg Oral q1800 Clyde Canterbury, MD      . bacitracin ointment 1 application  1 application Topical O6Z Clyde Canterbury, MD   1 application at 12/45/80 0533  . brimonidine (ALPHAGAN) 0.2 % ophthalmic solution 1 drop  1 drop Left Eye BID Clyde Canterbury, MD   1 drop at 12/10/16 2115  . docusate sodium (COLACE) capsule 100 mg  100 mg Oral BID Clyde Canterbury, MD   100 mg at 12/10/16 2114  . glimepiride (AMARYL) tablet 2 mg  2 mg Oral Q breakfast Clyde Canterbury, MD      . heparin injection 5,000 Units  5,000 Units Subcutaneous Camelia Phenes Clyde Canterbury, MD   5,000 Units at 12/11/16 0533  . HYDROcodone-acetaminophen (NORCO/VICODIN) 5-325 MG per tablet 1-2 tablet  1-2 tablet Oral Q6H PRN Clyde Canterbury, MD      . insulin aspart (novoLOG) injection 0-9 Units  0-9 Units Subcutaneous TID WC Clyde Canterbury, MD   5 Units at 12/10/16 1723  . ipratropium-albuterol (DUONEB) 0.5-2.5 (3) MG/3ML nebulizer solution 3 mL  3 mL Nebulization Q6H PRN Clyde Canterbury, MD      . latanoprost (XALATAN) 0.005 % ophthalmic solution 1 drop  1 drop Left Eye Leodis Binet, MD   1 drop at 12/10/16 2115  . morphine 2 MG/ML injection 2-4 mg  2-4 mg Intravenous Q2H PRN Clyde Canterbury, MD      . ondansetron [facility])) tablet 4 mg  4 mg Oral Q4H PRN Clyde Canterbury, MD       Or  . ondansetron [facility]) injection 4 mg  4 mg Intravenous Q4H PRN Clyde Canterbury, MD      .  Medications Prior to Admission  Medication Sig Dispense Refill  . acetaminophen (TYLENOL 8 HOUR) 650 MG CR tablet Take 650 mg by mouth every 8 (eight) hours as needed for pain.    Marland Kitchen albuterol (PROAIR HFA) 108 (90 Base) MCG/ACT inhaler Inhale 1 puff into the lungs every 6 (six) hours as needed for wheezing or shortness of breath.    Marland Kitchen amiodarone (PACERONE) 200 MG tablet Take 100 mg by mouth daily.    Marland Kitchen amLODipine (NORVASC) 5 MG tablet Take 5 mg by mouth daily.    Marland Kitchen apixaban (ELIQUIS) 2.5 MG TABS tablet Take 2.5 mg by mouth 2 (two) times daily.    Marland Kitchen atorvastatin (LIPITOR) 20 MG tablet Take 20 mg by mouth daily.    . brimonidine (ALPHAGAN) 0.2 % ophthalmic solution Place 1 drop into the left eye 2 (two) times daily.    . Cholecalciferol (VITAMIN D) 2000 units tablet Take 2,000 Units by mouth daily.    Marland Kitchen  clotrimazole (GYNE-LOTRIMIN) 1 % vaginal cream Place 1 application vaginally daily as needed. For yeast infection.    Marland Kitchen glimepiride (AMARYL) 2 MG tablet Take 2 mg by mouth daily.    . hydrocortisone cream (NEOSPORIN ECZEMA ESSENT MAX ST) 1 % Apply 1 application topically 2 (two) times daily as needed for itching (for sores.).    Marland Kitchen sodium chloride (OCEAN) 0.65 % nasal spray Place 2 sprays into the nose daily as needed.    . travoprost, benzalkonium, (TRAVATAN) 0.004 % ophthalmic solution Place 1 drop into the left eye at bedtime.    Marland Kitchen ipratropium-albuterol (DUONEB) 0.5-2.5 (3) MG/3ML SOLN Take 3 mLs by nebulization every 4 (four) hours as needed. (Patient not taking: Reported on 12/01/2016) 360 mL 0  . Rivaroxaban (XARELTO PO) Take 1 tablet by mouth 2 (two) times daily.     Marland Kitchen tiotropium (SPIRIVA) 18 MCG inhalation capsule Place 1 capsule (18 mcg total) into inhaler and inhale daily. (Patient not taking: Reported on  11/28/2016) 30 capsule 12    OBJECTIVE:  PHYSICAL EXAM  Vitals: Blood pressure (!) 117/56, pulse 63, temperature 98.1 F (36.7 C), temperature source Oral, resp. rate 16, height '5\' 1"'  (1.549 m), weight 117 lb 8.1 oz (53.3 kg), SpO2 94 %.. General: Well-developed, Well-nourished in no acute distress Mood: Mood and affect well adjusted, pleasant and cooperative. Orientation: Grossly alert and oriented. Vocal Quality: No hoarseness. Communicates verbally. head and Face: NCAT. No facial asymmetry. No visible skin lesions. No significant facial scars. No tenderness with sinus percussion. Facial strength normal and symmetric. Neck: Supple and symmetric with no palpable masses, tenderness or crepitance. The trachea is midline. Wound clean, dry intact. No hematoma. Drain has serosanguinmous drainage Lymphatic: Cervical lymph nodes are without palpable lymphadenopathy or tenderness. Respiratory: Normal respiratory effort without labored breathing. Lungs CTA, except maybe minimal rales in bases Cardiovascular:  regular rate and rhythm  MEDICAL DECISION MAKING: Data Review:  Results for orders placed or performed during the hospital encounter of 12/10/16 (from the past 48 hour(s))  Glucose, capillary     Status: Abnormal   Collection Time: 12/10/16  6:13 AM  Result Value Ref Range   Glucose-Capillary 179 (H) 65 - 99 mg/dL  Glucose, capillary     Status: Abnormal   Collection Time: 12/10/16 11:53 AM  Result Value Ref Range   Glucose-Capillary 195 (H) 65 - 99 mg/dL  CBC     Status: Abnormal   Collection Time: 12/10/16  1:55 PM  Result Value Ref Range   WBC 10.0 3.6 - 11.0 K/uL   RBC 3.85 3.80 - 5.20 MIL/uL   Hemoglobin 11.8 (L) 12.0 - 16.0 g/dL   HCT 35.2 35.0 - 47.0 %   MCV 91.5 80.0 - 100.0 fL   MCH 30.6 26.0 - 34.0 pg   MCHC 33.4 32.0 - 36.0 g/dL   RDW 13.7 11.5 - 14.5 %   Platelets 197 150 - 440 K/uL  Creatinine, serum     Status: Abnormal   Collection Time: 12/10/16  1:55 PM  Result  Value Ref Range   Creatinine, Ser 0.99 0.44 - 1.00 mg/dL   GFR calc non Af Amer 48 (L) >60 mL/min   GFR calc Af Amer 55 (L) >60 mL/min    Comment: (NOTE) The eGFR has been calculated using the CKD EPI equation. This calculation has not been validated in all clinical situations. eGFR's persistently <60 mL/min signify possible Chronic Kidney Disease.   Glucose, capillary     Status: Abnormal  Collection Time: 12/10/16  4:54 PM  Result Value Ref Range   Glucose-Capillary 252 (H) 65 - 99 mg/dL  Glucose, capillary     Status: Abnormal   Collection Time: 12/10/16  9:20 PM  Result Value Ref Range   Glucose-Capillary 190 (H) 65 - 99 mg/dL  . No results found..   ASSESSMENT: Doing well after left superficial parotid/modified radical neck dissection   PLAN: PT today to assist with ambulation. Advance diet. Leave drain for now.   Sarah Nearing, MD 12/11/2016 7:22 AM

## 2016-12-11 NOTE — Evaluation (Cosign Needed)
Clinical/Bedside Swallow Evaluation Patient Details  Name: Sarah Dougherty MRN: [id number] Date of Birth: 12/21/1921  Today's Date: 12/11/2016 Time: SLP Start Time (ACUTE ONLY): 1200 SLP Stop Time (ACUTE ONLY): 1300 SLP Time Calculation (min) (ACUTE ONLY): 60 min  Past Medical History:  Past Medical History:  Diagnosis Date  . A-fib ([place])   . Arthritis    Osteoarthritis  . Asthma   . Chronic kidney disease    UTI  . COPD (chronic obstructive pulmonary disease) ([place])   . Coronary artery disease   . Diabetes mellitus without complication ([place])   . GERD (gastroesophageal reflux disease)   . Glaucoma (increased eye pressure)   . HOH (hard of hearing)    Left Hearing Aid  . Hypertension   . Hyperthyroidism   . On home oxygen therapy    uses at night  . Presence of permanent cardiac pacemaker   . Sleep apnea    No C-PAP   Past Surgical History:  Past Surgical History:  Procedure Laterality Date  . ABDOMINAL HYSTERECTOMY    . EYE SURGERY Left    Cataract Extraction with IOL  . EYE SURGERY Right    Artificial Eye  . INSERT / REPLACE / REMOVE PACEMAKER    . PACEMAKER INSERTION Left 12/12/2014   Procedure: INSERTION PACEMAKER;  Surgeon: Isaias Cowman, MD;  Location: [facility] ORS;  Service: Cardiovascular;  Laterality: Left;  . PAROTIDECTOMY Left 12/10/2016   Procedure: PAROTIDECTOMY;  Surgeon: Clyde Canterbury, MD;  Location: [facility] ORS;  Service: ENT;  Laterality: Left;  . RADICAL NECK DISSECTION Left 12/10/2016   Procedure: RADICAL NECK DISSECTION;  Surgeon: Clyde Canterbury, MD;  Location: [facility] ORS;  Service: ENT;  Laterality: Left;   HPI:  Pt is a 83 y.o. old female post [place]. Pt is HOH and has hx of esophageal dismotility with strictures and dilatations, GERD, asthma, sleep apnea, COPD, and hypertension, and diabetes.  Pt appears awake and alert; conversing with others. Pt follows commands appropriately.    Assessment / Plan /  Recommendation Clinical Impression    Pt presents with mild Oral phase Dysphagia with possible Pharyngeal phase involvement as per son's report that she was seen by ST services in January 2018. Pt could be at min increased risk for aspiration d/t current illness. Oral phase was c/b slight munching d/t lack of dentition. Pt was given purees, soft solids (applesauce, graham crackers) and thin liquids. Mild coughing was noted 1x during trials of thin liquid d/t talking during drinking - pt educated on risks of talking during po's. Frequency and consistency of coughing not noted during session. Pt appeared to have bolus control and cleared mouth adequately with lingual sweeping during puree and soft solid trials. Pt's mastication was an appropriate time. During the pharyngeal phase, no over s/s of aspiration were noted w/ all bolus trials when pt followed aspiration precautions of not talking during po's. No decline in respiratory status or vocal quality during the trials.  Son stated that pt had previous concern for aspiration in January 2018 with [facility] services at [facility]). At that time pt's liquids were "thickened a little bit" per son and family continued thickening "a little" since pt has returned home from SNF. When asked, pt denied thickening her liquids, stating she only "added a little to my coffee sometimes". She stated several times, she did not thicken her liquids when living with her other son. When asked 3 times if son (present in room) wanted pt's liquids to  be thickened while admitted in the hospital, son stated that he wanted "no change" in pt's diet.  Recommend continuing current diet recommended by ENT post recent surgery. ST services will f/u with pt status, education, and diet toleration. NSG updated. Pt and family educated and agreed.   SLP Visit Diagnosis: Dysphagia, oropharyngeal phase (R13.12)    Aspiration Risk  Mild aspiration risk (reduced with precautions)    Diet  Recommendation   Full liquid per ENT; aspiration precautions.   Medication Administration: Whole meds with puree    Other  Recommendations Recommended Consults:  (dietician f/u) Oral Care Recommendations: Oral care BID;Staff/trained caregiver to provide oral care;Patient independent with oral care   Follow up Recommendations None      Frequency and Duration min 2x/week  1 week       Prognosis Prognosis for Safe Diet Advancement: Good      Swallow Study   General Date of Onset: 12/10/16 HPI: Pt is a 83 y.o. old female post [place]. Pt is HOH and has hx of esophageal dismotility with strictures and dilatations, GERD, asthma, sleep apnea, COPD, and hypertension, and diabetes.  Pt appears awake and alert; conversing with others. Pt follows commands appropriately.  Type of Study: Bedside Swallow Evaluation Previous Swallow Assessment: none reported Diet Prior to this Study: Thin liquids (full liquids per ENT and MD post surgery) Temperature Spikes Noted: No (wbc wnl) Respiratory Status: Room air History of Recent Intubation: No Behavior/Cognition: Alert;Cooperative;Pleasant mood;Requires cueing (min) Oral Cavity Assessment: Within Functional Limits Oral Care Completed by SLP: Recent completion by staff Oral Cavity - Dentition: Poor condition;Missing dentition (most) Vision: Functional for self-feeding Self-Feeding Abilities: Able to feed self;Needs set up;Needs assist (min) Patient Positioning: Upright in bed Baseline Vocal Quality: Normal Volitional Cough: Strong;Congested Volitional Swallow: Able to elicit    Oral/Motor/Sensory Function Overall Oral Motor/Sensory Function: Within functional limits   Ice Chips Ice chips: Not tested   Thin Liquid Thin Liquid: Within functional limits (mild cough 1x; educated on not talking while eating/drinking) Presentation: Cup;Self Fed    Nectar Thick Nectar Thick Liquid: Not tested   Honey Thick Honey Thick  Liquid: Not tested   Puree Puree: Within functional limits Presentation: Self Fed;Spoon (3 trials )   Solid   GO   Solid: Within functional limits Presentation: Self Fed;Spoon (graham cracker moistened)       Carolynn Sayers, SLP-Graduate Student Carolynn Sayers 12/11/2016,3:20 PM

## 2016-12-11 NOTE — Progress Notes (Addendum)
Inpatient Diabetes Program Recommendations  AACE/ADA: New Consensus Statement on Inpatient Glycemic Control (2015)  Target Ranges:  Prepandial:   less than 140 mg/dL      Peak postprandial:   less than 180 mg/dL (1-2 hours)      Critically ill patients:  140 - 180 mg/dL   Lab Results  Component Value Date   GLUCAP 146 (H) 12/11/2016    Review of Glycemic Control  Results for Sarah, Dougherty (MRN [id number]) as of 12/11/2016 10:06  Ref. Range 12/10/2016 06:13 12/10/2016 11:53 12/10/2016 16:54 12/10/2016 21:20 12/11/2016 07:58  Glucose-Capillary Latest Ref Range: 65 - 99 mg/dL 179 (H) 195 (H) 252 (H) 190 (H) 146 (H)    Diabetes history: Type 2 Outpatient Diabetes medications: Amaryl 2mg  qday Current orders for Inpatient glycemic control: Amaryl 2mg  qday, Novolog 0-9 units tid  Inpatient Diabetes Program Recommendations:    Per ADA recommendations "consider performing an A1C on all patients with diabetes or hyperglycemia admitted to the hospital if not performed in the prior 3 months".  Given this patient's age, & weight, it may be safer to switch her from Amaryl to a DPP-4 since it is not cleared renally and does not cause hypoglycemia.- please encourage patient to discuss with her primary care doctor.   Gentry Fitz, RN, BA, MHA, CDE Diabetes Coordinator Inpatient Diabetes Program  [phone] (Team Pager) [phone] ([place]) 12/11/2016 10:12 AM

## 2016-12-11 NOTE — Progress Notes (Signed)
Sarah Dougherty, Sarah Dougherty [id number] April 14, 1922 Sarah Nearing, MD   SUBJECTIVE: This 83 y.o. year old female is status post [place]. Doing well. Has advanced diet and up with assistance. Good pain control.  Medications:  Current Facility-Administered Medications  Medication Dose Route Frequency Provider Last Rate Last Dose  . 0.9 % NaCl with KCl 20 mEq/ L  infusion   Intravenous Continuous Clyde Canterbury, MD 75 mL/hr at 12/11/16 [phone]    . amiodarone (PACERONE) tablet 200 mg  200 mg Oral Daily Clyde Canterbury, MD   200 mg at 12/11/16 2426  . amLODipine (NORVASC) tablet 5 mg  5 mg Oral Daily Clyde Canterbury, MD   5 mg at 12/11/16 8341  . atorvastatin (LIPITOR) tablet 20 mg  20 mg Oral q1800 Clyde Canterbury, MD   20 mg at 12/11/16 9622  . bacitracin ointment 1 application  1 application Topical W9N Clyde Canterbury, MD   1 application at 98/92/11 1440  . brimonidine (ALPHAGAN) 0.2 % ophthalmic solution 1 drop  1 drop Left Eye BID Clyde Canterbury, MD   1 drop at 12/11/16 [phone]  . docusate sodium (COLACE) capsule 100 mg  100 mg Oral BID Clyde Canterbury, MD   100 mg at 12/11/16 4081  . feeding supplement (ENSURE ENLIVE) (ENSURE ENLIVE) liquid 237 mL  237 mL Oral BID BM Clyde Canterbury, MD      . glimepiride (AMARYL) tablet 2 mg  2 mg Oral Q breakfast Clyde Canterbury, MD   2 mg at 12/11/16 4481  . heparin injection 5,000 Units  5,000 Units Subcutaneous Andee Poles, MD   5,000 Units at 12/11/16 1439  . HYDROcodone-acetaminophen (NORCO/VICODIN) 5-325 MG per tablet 1-2 tablet  1-2 tablet Oral Q6H PRN Clyde Canterbury, MD      . insulin aspart (novoLOG) injection 0-9 Units  0-9 Units Subcutaneous TID WC Clyde Canterbury, MD   1 Units at 12/11/16 1200  . ipratropium-albuterol (DUONEB) 0.5-2.5 (3) MG/3ML nebulizer solution 3 mL  3 mL Nebulization Q6H PRN Clyde Canterbury, MD      . latanoprost (XALATAN) 0.005 % ophthalmic solution 1 drop  1 drop Left Eye Leodis Binet, MD   1 drop at 12/10/16 2115  .  morphine 2 MG/ML injection 2-4 mg  2-4 mg Intravenous Q2H PRN Clyde Canterbury, MD      . multivitamin liquid 15 mL  15 mL Oral Daily Clyde Canterbury, MD      . ondansetron [facility]) tablet 4 mg  4 mg Oral Q4H PRN Clyde Canterbury, MD       Or  . ondansetron [facility]) injection 4 mg  4 mg Intravenous Q4H PRN Clyde Canterbury, MD      .  Medications Prior to Admission  Medication Sig Dispense Refill  . acetaminophen (TYLENOL 8 HOUR) 650 MG CR tablet Take 650 mg by mouth every 8 (eight) hours as needed for pain.    Marland Kitchen albuterol (PROAIR HFA) 108 (90 Base) MCG/ACT inhaler Inhale 1 puff into the lungs every 6 (six) hours as needed for wheezing or shortness of breath.    Marland Kitchen amiodarone (PACERONE) 200 MG tablet Take 100 mg by mouth daily.    Marland Kitchen amLODipine (NORVASC) 5 MG tablet Take 5 mg by mouth daily.    Marland Kitchen apixaban (ELIQUIS) 2.5 MG TABS tablet Take 2.5 mg by mouth 2 (two) times daily.    Marland Kitchen atorvastatin (LIPITOR) 20 MG tablet Take 20 mg by mouth daily.    . brimonidine (ALPHAGAN) 0.2 % ophthalmic  solution Place 1 drop into the left eye 2 (two) times daily.    . Cholecalciferol (VITAMIN D) 2000 units tablet Take 2,000 Units by mouth daily.    . clotrimazole (GYNE-LOTRIMIN) 1 % vaginal cream Place 1 application vaginally daily as needed. For yeast infection.    Marland Kitchen glimepiride (AMARYL) 2 MG tablet Take 2 mg by mouth daily.    . hydrocortisone cream (NEOSPORIN ECZEMA ESSENT MAX ST) 1 % Apply 1 application topically 2 (two) times daily as needed for itching (for sores.).    Marland Kitchen sodium chloride (OCEAN) 0.65 % nasal spray Place 2 sprays into the nose daily as needed.    . travoprost, benzalkonium, (TRAVATAN) 0.004 % ophthalmic solution Place 1 drop into the left eye at bedtime.    Marland Kitchen ipratropium-albuterol (DUONEB) 0.5-2.5 (3) MG/3ML SOLN Take 3 mLs by nebulization every 4 (four) hours as needed. (Patient not taking: Reported on 12/01/2016) 360 mL 0  . Rivaroxaban (XARELTO PO) Take 1 tablet by mouth 2 (two) times daily.     Marland Kitchen  tiotropium (SPIRIVA) 18 MCG inhalation capsule Place 1 capsule (18 mcg total) into inhaler and inhale daily. (Patient not taking: Reported on 11/28/2016) 30 capsule 12    OBJECTIVE:  PHYSICAL EXAM  Vitals: Blood pressure 125/61, pulse 69, temperature 98.1 F (36.7 C), temperature source Oral, resp. rate 19, height 5\' 1"  (1.549 m), weight 117 lb 8.1 oz (53.3 kg), SpO2 94 %.. General: Well-developed, Well-nourished in no acute distress Mood: Mood and affect well adjusted, pleasant and cooperative. Orientation: Grossly alert and oriented. Vocal Quality: No hoarseness. Communicates verbally. head and Face: NCAT. No facial asymmetry. No visible skin lesions. No significant facial scars. No tenderness with sinus percussion. Facial strength normal and symmetric. Neck: Supple and symmetric with no palpable masses, tenderness or crepitance. The trachea is midline. Wound clean dry and intact. Drain seems to be slowing down, serosanguinous fluid lightening up Respiratory: Normal respiratory effort without labored breathing.  MEDICAL DECISION MAKING: Data Review:  Results for orders placed or performed during the hospital encounter of 12/10/16 (from the past 48 hour(s))  Glucose, capillary     Status: Abnormal   Collection Time: 12/10/16  6:13 AM  Result Value Ref Range   Glucose-Capillary 179 (H) 65 - 99 mg/dL  Glucose, capillary     Status: Abnormal   Collection Time: 12/10/16 11:53 AM  Result Value Ref Range   Glucose-Capillary 195 (H) 65 - 99 mg/dL  CBC     Status: Abnormal   Collection Time: 12/10/16  1:55 PM  Result Value Ref Range   WBC 10.0 3.6 - 11.0 K/uL   RBC 3.85 3.80 - 5.20 MIL/uL   Hemoglobin 11.8 (L) 12.0 - 16.0 g/dL   HCT 12/12/16 01.2 - 39.3 %   MCV 91.5 80.0 - 100.0 fL   MCH 30.6 26.0 - 34.0 pg   MCHC 33.4 32.0 - 36.0 g/dL   RDW 59.4 09.0 - 50.2 %   Platelets 197 150 - 440 K/uL  Creatinine, serum     Status: Abnormal   Collection Time: 12/10/16  1:55 PM  Result Value Ref  Range   Creatinine, Ser 0.99 0.44 - 1.00 mg/dL   GFR calc non Af Amer 48 (L) >60 mL/min   GFR calc Af Amer 55 (L) >60 mL/min    Comment: (NOTE) The eGFR has been calculated using the CKD EPI equation. This calculation has not been validated in all clinical situations. eGFR's persistently <60 mL/min signify possible Chronic Kidney  Disease.   Glucose, capillary     Status: Abnormal   Collection Time: 12/10/16  4:54 PM  Result Value Ref Range   Glucose-Capillary 252 (H) 65 - 99 mg/dL  Glucose, capillary     Status: Abnormal   Collection Time: 12/10/16  9:20 PM  Result Value Ref Range   Glucose-Capillary 190 (H) 65 - 99 mg/dL  Glucose, capillary     Status: Abnormal   Collection Time: 12/11/16  7:58 AM  Result Value Ref Range   Glucose-Capillary 146 (H) 65 - 99 mg/dL  Glucose, capillary     Status: Abnormal   Collection Time: 12/11/16 11:40 AM  Result Value Ref Range   Glucose-Capillary 141 (H) 65 - 99 mg/dL  . No results found..   ASSESSMENT: s/p left superficial parotidectomy, neck dissection. Doing well.  PLAN: Plan to remove drain tomorrow, and likely discharge home.   Sarah Nearing, MD 12/11/2016 4:38 PM

## 2016-12-12 LAB — GLUCOSE, CAPILLARY: Glucose-Capillary: 130 mg/dL — ABNORMAL HIGH (ref 65–99)

## 2016-12-12 MED ORDER — SODIUM CHLORIDE 0.9% FLUSH
3.0000 mL | Freq: Two times a day (BID) | INTRAVENOUS | Status: DC
  Administered 2016-12-12: 3 mL via INTRAVENOUS

## 2016-12-12 NOTE — Progress Notes (Signed)
Discharge instructions reviewed with the patient and her son.

## 2016-12-12 NOTE — Discharge Summary (Signed)
Sarah Dougherty, Sarah Dougherty [id number] [birth year] [name], MD   SUBJECTIVE: This 83 y.o. year old female is status post [place]. Doing well with diet. Little pain. Drain removed this AM.  Medications:  Current Facility-Administered Medications  Medication Dose Route Frequency Provider Last Rate Last Dose  . amiodarone (PACERONE) tablet 200 mg  200 mg Oral Daily Clyde Canterbury, MD   200 mg at 12/11/16 4098  . amLODipine (NORVASC) tablet 5 mg  5 mg Oral Daily Clyde Canterbury, MD   5 mg at 12/11/16 1191  . atorvastatin (LIPITOR) tablet 20 mg  20 mg Oral q1800 Clyde Canterbury, MD   20 mg at 12/11/16 4782  . bacitracin ointment 1 application  1 application Topical N5A Clyde Canterbury, MD   1 application at 21/30/86 [phone]  . brimonidine (ALPHAGAN) 0.2 % ophthalmic solution 1 drop  1 drop Left Eye BID Clyde Canterbury, MD   1 drop at 12/11/16 2121  . docusate sodium (COLACE) capsule 100 mg  100 mg Oral BID Clyde Canterbury, MD   100 mg at 12/11/16 2121  . feeding supplement (ENSURE ENLIVE) (ENSURE ENLIVE) liquid 237 mL  237 mL Oral BID BM Clyde Canterbury, MD      . glimepiride (AMARYL) tablet 2 mg  2 mg Oral Q breakfast Clyde Canterbury, MD   2 mg at 12/12/16 0813  . heparin injection 5,000 Units  5,000 Units Subcutaneous Andee Poles, MD   5,000 Units at 12/12/16 0606  . HYDROcodone-acetaminophen (NORCO/VICODIN) 5-325 MG per tablet 1-2 tablet  1-2 tablet Oral Q6H PRN Clyde Canterbury, MD      . insulin aspart (novoLOG) injection 0-9 Units  0-9 Units Subcutaneous TID WC Clyde Canterbury, MD   1 Units at 12/12/16 [phone]  . ipratropium-albuterol (DUONEB) 0.5-2.5 (3) MG/3ML nebulizer solution 3 mL  3 mL Nebulization Q6H PRN Clyde Canterbury, MD      . latanoprost (XALATAN) 0.005 % ophthalmic solution 1 drop  1 drop Left Eye Leodis Binet, MD   1 drop at 12/11/16 2121  . morphine 2 MG/ML injection 2-4 mg  2-4 mg Intravenous Q2H PRN Clyde Canterbury, MD      . multivitamin liquid 15 mL  15 mL Oral Daily  Clyde Canterbury, MD   15 mL at 12/11/16 2346  . ondansetron (ZOFRAN) tablet 4 mg  4 mg Oral Q4H PRN Clyde Canterbury, MD       Or  . ondansetron [facility]) injection 4 mg  4 mg Intravenous Q4H PRN Clyde Canterbury, MD      . sodium chloride flush (NS) 0.9 % injection 3 mL  3 mL Intravenous Q12H Clyde Canterbury, MD   3 mL at 12/12/16 0440  .  Medications Prior to Admission  Medication Sig Dispense Refill  . acetaminophen (TYLENOL 8 HOUR) 650 MG CR tablet Take 650 mg by mouth every 8 (eight) hours as needed for pain.    Marland Kitchen albuterol (PROAIR HFA) 108 (90 Base) MCG/ACT inhaler Inhale 1 puff into the lungs every 6 (six) hours as needed for wheezing or shortness of breath.    Marland Kitchen amiodarone (PACERONE) 200 MG tablet Take 100 mg by mouth daily.    Marland Kitchen amLODipine (NORVASC) 5 MG tablet Take 5 mg by mouth daily.    Marland Kitchen apixaban (ELIQUIS) 2.5 MG TABS tablet Take 2.5 mg by mouth 2 (two) times daily.    Marland Kitchen atorvastatin (LIPITOR) 20 MG tablet Take 20 mg by mouth daily.    . brimonidine (ALPHAGAN) 0.2 %  ophthalmic solution Place 1 drop into the left eye 2 (two) times daily.    . Cholecalciferol (VITAMIN D) 2000 units tablet Take 2,000 Units by mouth daily.    . clotrimazole (GYNE-LOTRIMIN) 1 % vaginal cream Place 1 application vaginally daily as needed. For yeast infection.    Marland Kitchen glimepiride (AMARYL) 2 MG tablet Take 2 mg by mouth daily.    . hydrocortisone cream (NEOSPORIN ECZEMA ESSENT MAX ST) 1 % Apply 1 application topically 2 (two) times daily as needed for itching (for sores.).    Marland Kitchen sodium chloride (OCEAN) 0.65 % nasal spray Place 2 sprays into the nose daily as needed.    . travoprost, benzalkonium, (TRAVATAN) 0.004 % ophthalmic solution Place 1 drop into the left eye at bedtime.    Marland Kitchen ipratropium-albuterol (DUONEB) 0.5-2.5 (3) MG/3ML SOLN Take 3 mLs by nebulization every 4 (four) hours as needed. (Patient not taking: Reported on 12/01/2016) 360 mL 0  . Rivaroxaban (XARELTO PO) Take 1 tablet by mouth 2 (two) times daily.      Marland Kitchen tiotropium (SPIRIVA) 18 MCG inhalation capsule Place 1 capsule (18 mcg total) into inhaler and inhale daily. (Patient not taking: Reported on 11/28/2016) 30 capsule 12    OBJECTIVE:  PHYSICAL EXAM  Vitals: Blood pressure 138/68, pulse (!) 118, temperature 98.6 F (37 C), temperature source Oral, resp. rate 20, height '5\' 1"'  (1.549 m), weight 117 lb 8.1 oz (53.3 kg), SpO2 96 %.. General: Well-developed, Well-nourished in no acute distress Mood: Mood and affect well adjusted, pleasant and cooperative. Orientation: Grossly alert and oriented. Vocal Quality: No hoarseness. Communicates verbally. head and Face: NCAT. No facial asymmetry. No visible skin lesions. No significant facial scars. No tenderness with sinus percussion. Facial strength normal and symmetric. Neck: Supple and symmetric with no palpable masses, tenderness or crepitance. The trachea is midline. Wound is clean/dry/intact. Drain removed. Serous discharge. Respiratory: Normal respiratory effort without labored breathing.  MEDICAL DECISION MAKING: Data Review:  Results for orders placed or performed during the hospital encounter of 12/10/16 (from the past 48 hour(s))  Glucose, capillary     Status: Abnormal   Collection Time: 12/10/16 11:53 AM  Result Value Ref Range   Glucose-Capillary 195 (H) 65 - 99 mg/dL  CBC     Status: Abnormal   Collection Time: 12/10/16  1:55 PM  Result Value Ref Range   WBC 10.0 3.6 - 11.0 K/uL   RBC 3.85 3.80 - 5.20 MIL/uL   Hemoglobin 11.8 (L) 12.0 - 16.0 g/dL   HCT 35.2 35.0 - 47.0 %   MCV 91.5 80.0 - 100.0 fL   MCH 30.6 26.0 - 34.0 pg   MCHC 33.4 32.0 - 36.0 g/dL   RDW 13.7 11.5 - 14.5 %   Platelets 197 150 - 440 K/uL  Creatinine, serum     Status: Abnormal   Collection Time: 12/10/16  1:55 PM  Result Value Ref Range   Creatinine, Ser 0.99 0.44 - 1.00 mg/dL   GFR calc non Af Amer 48 (L) >60 mL/min   GFR calc Af Amer 55 (L) >60 mL/min    Comment: (NOTE) The eGFR has been calculated  using the CKD EPI equation. This calculation has not been validated in all clinical situations. eGFR's persistently <60 mL/min signify possible Chronic Kidney Disease.   Glucose, capillary     Status: Abnormal   Collection Time: 12/10/16  4:54 PM  Result Value Ref Range   Glucose-Capillary 252 (H) 65 - 99 mg/dL  Glucose, capillary  Status: Abnormal   Collection Time: 12/10/16  9:20 PM  Result Value Ref Range   Glucose-Capillary 190 (H) 65 - 99 mg/dL  Glucose, capillary     Status: Abnormal   Collection Time: 12/11/16  7:58 AM  Result Value Ref Range   Glucose-Capillary 146 (H) 65 - 99 mg/dL  Glucose, capillary     Status: Abnormal   Collection Time: 12/11/16 11:40 AM  Result Value Ref Range   Glucose-Capillary 141 (H) 65 - 99 mg/dL  Glucose, capillary     Status: Abnormal   Collection Time: 12/11/16  4:42 PM  Result Value Ref Range   Glucose-Capillary 103 (H) 65 - 99 mg/dL  Glucose, capillary     Status: Abnormal   Collection Time: 12/11/16  9:50 PM  Result Value Ref Range   Glucose-Capillary 147 (H) 65 - 99 mg/dL  Glucose, capillary     Status: Abnormal   Collection Time: 12/12/16  7:38 AM  Result Value Ref Range   Glucose-Capillary 130 (H) 65 - 99 mg/dL  . No results found..   ASSESSMENT: Doing well. D/c home  PLAN: F/u 1 week. May shower. Polysporin to wound BID.   [name], MD 12/12/2016 8:18 AM

## 2016-12-12 NOTE — Progress Notes (Signed)
PT Cancellation Note  Patient Details Name: LATORA QUARRY MRN: [id number] DOB: 1921/12/19   Cancelled Treatment:    Reason Eval/Treat Not Completed: Other (comment); Nursing with pt upon entering pt's room.  Nursing reported that pt was in the process of discharging and that pt had no PT needs at this time.  Will complete orders and will reassess pending any change in status upon receipt of new PT orders.     Linus Salmons PT, DPT 12/12/16, 11:41 AM

## 2016-12-12 NOTE — Final Progress Note (Signed)
Please see note in D/C summary.  Malon Kindle, MD

## 2016-12-19 DIAGNOSIS — C07 Malignant neoplasm of parotid gland: Secondary | ICD-10-CM | POA: Diagnosis not present

## 2016-12-22 DIAGNOSIS — C07 Malignant neoplasm of parotid gland: Secondary | ICD-10-CM | POA: Diagnosis not present

## 2016-12-29 DIAGNOSIS — C07 Malignant neoplasm of parotid gland: Secondary | ICD-10-CM | POA: Diagnosis not present

## 2016-12-31 ENCOUNTER — Ambulatory Visit
Admission: RE | Admit: 2016-12-31 | Discharge: 2016-12-31 | Disposition: A | Discharge status: Home / Self Care | Payer: PPO | Source: Ambulatory Visit | Attending: Radiation Oncology | Admitting: Radiation Oncology

## 2016-12-31 ENCOUNTER — Encounter: Payer: Self-pay | Admitting: Radiation Oncology

## 2016-12-31 VITALS — BP 116/66 | HR 72 | Temp 96.5°F | Resp 20 | Wt 119.2 lb

## 2016-12-31 DIAGNOSIS — J45909 Unspecified asthma, uncomplicated: Secondary | ICD-10-CM | POA: Diagnosis not present

## 2016-12-31 DIAGNOSIS — Z510 Encounter for antineoplastic radiation therapy: Secondary | ICD-10-CM | POA: Diagnosis not present

## 2016-12-31 DIAGNOSIS — N189 Chronic kidney disease, unspecified: Secondary | ICD-10-CM | POA: Diagnosis not present

## 2016-12-31 DIAGNOSIS — F1920 Other psychoactive substance dependence, uncomplicated: Secondary | ICD-10-CM | POA: Diagnosis not present

## 2016-12-31 DIAGNOSIS — K219 Gastro-esophageal reflux disease without esophagitis: Secondary | ICD-10-CM | POA: Insufficient documentation

## 2016-12-31 DIAGNOSIS — E119 Type 2 diabetes mellitus without complications: Secondary | ICD-10-CM | POA: Insufficient documentation

## 2016-12-31 DIAGNOSIS — E0590 Thyrotoxicosis, unspecified without thyrotoxic crisis or storm: Secondary | ICD-10-CM | POA: Insufficient documentation

## 2016-12-31 DIAGNOSIS — J449 Chronic obstructive pulmonary disease, unspecified: Secondary | ICD-10-CM | POA: Insufficient documentation

## 2016-12-31 DIAGNOSIS — G4730 Sleep apnea, unspecified: Secondary | ICD-10-CM | POA: Insufficient documentation

## 2016-12-31 DIAGNOSIS — Z85828 Personal history of other malignant neoplasm of skin: Secondary | ICD-10-CM | POA: Diagnosis not present

## 2016-12-31 DIAGNOSIS — Z7984 Long term (current) use of oral hypoglycemic drugs: Secondary | ICD-10-CM | POA: Insufficient documentation

## 2016-12-31 DIAGNOSIS — I129 Hypertensive chronic kidney disease with stage 1 through stage 4 chronic kidney disease, or unspecified chronic kidney disease: Secondary | ICD-10-CM | POA: Insufficient documentation

## 2016-12-31 DIAGNOSIS — Z950 Presence of cardiac pacemaker: Secondary | ICD-10-CM | POA: Diagnosis not present

## 2016-12-31 DIAGNOSIS — C07 Malignant neoplasm of parotid gland: Secondary | ICD-10-CM | POA: Diagnosis not present

## 2016-12-31 DIAGNOSIS — I4891 Unspecified atrial fibrillation: Secondary | ICD-10-CM | POA: Insufficient documentation

## 2016-12-31 DIAGNOSIS — Z79899 Other long term (current) drug therapy: Secondary | ICD-10-CM | POA: Diagnosis not present

## 2016-12-31 NOTE — Progress Notes (Signed)
NEW PATIENT EVALUATION  Name: Sarah Dougherty  MRN: [id number]  Date:   12/31/2016     DOB: 1922/03/13   This 83 y.o. 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  REFERRING PHYSICIAN: Perrin Maltese, MD  CHIEF COMPLAINT:  Chief Complaint  Patient presents with  . Cancer    Pt is here for initial consultation of left parotid    DIAGNOSIS: The encounter diagnosis was Malignant neoplasm of parotid gland ([place]).   PREVIOUS INVESTIGATIONS:  CT scans reviewed Pathology reports reviewed Clinical notes reviewed  HPI: Patient is a 83 year old female who presented with increasing mass in the left side of her neck originally's referred to Dr. Marlyn Corporal referred her on to ENT. Patient had a squamous cell carcinoma of the skin in this region previously. CT scan of her neck performed August 9 showed 2 left parotid tail masses suggesting metastatic squamous cell carcinoma. The largest mass was necrotic measured up to 3 cm in length. There was suspicion of extracapsular disease. Patient was cleared for surgery and underwent (ectomy and left cervical node dissection. The parotid mass was metastatic squamous cell carcinoma with involvement of one level I lymph node 2 level II lymph nodes and no level III lymph nodes. Patient is tolerated her surgery well. She has some mild asymmetry of her smile reflecting probable facial nerve damage. She is swallowing well having no head and neck pain. She is now referred to radiation oncology for consideration of treatment.  PLANNED TREATMENT REGIMEN: Radiation therapy to her left parotid bed and left cervical chain  PAST MEDICAL HISTORY:  has a past medical history of A-fib ([place]); Arthritis; Asthma; Chronic kidney disease; COPD (chronic obstructive pulmonary disease) ([place]); Coronary artery disease; Diabetes mellitus without complication ([place]); GERD  (gastroesophageal reflux disease); Glaucoma (increased eye pressure); HOH (hard of hearing); Hypertension; Hyperthyroidism; On home oxygen therapy; Presence of permanent cardiac pacemaker; and Sleep apnea.    PAST SURGICAL HISTORY:  Past Surgical History:  Procedure Laterality Date  . ABDOMINAL HYSTERECTOMY    . EYE SURGERY Left    Cataract Extraction with IOL  . EYE SURGERY Right    Artificial Eye  . INSERT / REPLACE / REMOVE PACEMAKER    . PACEMAKER INSERTION Left 12/12/2014   Procedure: INSERTION PACEMAKER;  Surgeon: Isaias Cowman, MD;  Location: [facility] ORS;  Service: Cardiovascular;  Laterality: Left;  . PAROTIDECTOMY Left 12/10/2016   Procedure: PAROTIDECTOMY;  Surgeon: Clyde Canterbury, MD;  Location: [facility] ORS;  Service: ENT;  Laterality: Left;  . RADICAL NECK DISSECTION Left 12/10/2016   Procedure: RADICAL NECK DISSECTION;  Surgeon: Clyde Canterbury, MD;  Location: [facility] ORS;  Service: ENT;  Laterality: Left;    FAMILY HISTORY: family history includes Diabetes in her brother and son.  SOCIAL HISTORY:  reports that she has never smoked. She has never used smokeless tobacco. She reports that she does not drink alcohol or use drugs.  ALLERGIES: Amoxicillin; Cephalexin; Nitrofurantoin; Penicillins; Sulfa antibiotics; and Azithromycin  MEDICATIONS:  Current Outpatient Prescriptions  Medication Sig Dispense Refill  . acetaminophen (TYLENOL 8 HOUR) 650 MG CR tablet Take 650 mg by mouth every 8 (eight) hours as needed for pain.    Marland Kitchen albuterol (PROAIR HFA) 108 (90 Base) MCG/ACT inhaler Inhale 1 puff into the lungs every 6 (six) hours as needed for wheezing or shortness of breath.    Marland Kitchen amiodarone (PACERONE) 200 MG tablet Take 100  mg by mouth daily.    Marland Kitchen amLODipine (NORVASC) 5 MG tablet Take 5 mg by mouth daily.    Marland Kitchen atorvastatin (LIPITOR) 20 MG tablet Take 20 mg by mouth daily.    . brimonidine (ALPHAGAN) 0.2 % ophthalmic solution Place 1 drop into the left eye 2 (two) times daily.    .  Cholecalciferol (VITAMIN D) 2000 units tablet Take 2,000 Units by mouth daily.    . clotrimazole (GYNE-LOTRIMIN) 1 % vaginal cream Place 1 application vaginally daily as needed. For yeast infection.    Marland Kitchen glimepiride (AMARYL) 2 MG tablet Take 2 mg by mouth daily.    . hydrocortisone cream (NEOSPORIN ECZEMA ESSENT MAX ST) 1 % Apply 1 application topically 2 (two) times daily as needed for itching (for sores.).    Marland Kitchen sodium chloride (OCEAN) 0.65 % nasal spray Place 2 sprays into the nose daily as needed.    . travoprost, benzalkonium, (TRAVATAN) 0.004 % ophthalmic solution Place 1 drop into the left eye at bedtime.    Marland Kitchen ipratropium-albuterol (DUONEB) 0.5-2.5 (3) MG/3ML SOLN Take 3 mLs by nebulization every 4 (four) hours as needed. (Patient not taking: Reported on 12/01/2016) 360 mL 0   No current facility-administered medications for this encounter.     ECOG PERFORMANCE STATUS:  0 - Asymptomatic  REVIEW OF SYSTEMS:  Patient denies any weight loss, fatigue, weakness, fever, chills or night sweats. Patient denies any loss of vision, blurred vision. Patient denies any ringing  of the ears or hearing loss. No irregular heartbeat. Patient denies heart murmur or history of fainting. Patient denies any chest pain or pain radiating to her upper extremities. Patient denies any shortness of breath, difficulty breathing at night, cough or hemoptysis. Patient denies any swelling in the lower legs. Patient denies any nausea vomiting, vomiting of blood, or coffee ground material in the vomitus. Patient denies any stomach pain. Patient states has had normal bowel movements no significant constipation or diarrhea. Patient denies any dysuria, hematuria or significant nocturia. Patient denies any problems walking, swelling in the joints or loss of balance. Patient denies any skin changes, loss of hair or loss of weight. Patient denies any excessive worrying or anxiety or significant depression. Patient denies any problems  with insomnia. Patient denies excessive thirst, polyuria, polydipsia. Patient denies any swollen glands, patient denies easy bruising or easy bleeding. Patient denies any recent infections, allergies or URI. Patient "s visual fields have not changed significantly in recent time.    PHYSICAL EXAM: BP 116/66   Pulse 72   Temp (!) 96.5 F (35.8 C)   Resp 20   Wt 119 lb 2.5 oz (54.1 kg)   BMI 22.51 kg/m  Patient has more asymmetry of her left face. Oral cavity is clear teeth in a poor state of repair. No evidence of mass in left parotid bed or bilateral cervical chains are noted. No supraclavicular adenopathy is identified. Well-developed well-nourished patient in NAD. HEENT reveals PERLA, EOMI, discs not visualized.  Oral cavity is clear. No oral mucosal lesions are identified. Neck is clear without evidence of cervical or supraclavicular adenopathy. Lungs are clear to A&P. Cardiac examination is essentially unremarkable with regular rate and rhythm without murmur rub or thrill. Abdomen is benign with no organomegaly or masses noted. Motor sensory and DTR levels are equal and symmetric in the upper and lower extremities. Cranial nerves II through XII are grossly intact. Proprioception is intact. No peripheral adenopathy or edema is identified. No motor or sensory levels are  noted. Crude visual fields are within normal range.  LABORATORY DATA: Pathology reports reviewed    RADIOLOGY RESULTS: CT scan reviewed   IMPRESSION: Metastatic squamous cell carcinoma the left parotid as well as left cervical nodes in 83 year old female status post left thyroidectomy and left radical neck dissection.  PLAN: At this time I to go ahead with a short course of radiation therapy to eradicate any microscopic residual disease. We'll target her left parotid bed as well as her left neck and treat up to 5000 cGy over 5 weeks. I would use I MRT radiation therapy to spare critical structures such as the contralateral  parotid gland throat spinal cord. Risks and benefits of treatment including possible oral mucositis skin reaction fatigue alteration of blood counts all were discussed in detail with the patient. Her son was present for the discussion and they both seem to comprehend my treatment plan well. I have personally set up and ordered CT simulation for early next week.  I would like to take this opportunity to thank you for allowing me to participate in the care of your patient.Armstead Peaks., MD

## 2017-01-07 ENCOUNTER — Ambulatory Visit
Admission: RE | Admit: 2017-01-07 | Discharge: 2017-01-07 | Disposition: A | Discharge status: Home / Self Care | Payer: PPO | Source: Ambulatory Visit | Attending: Radiation Oncology | Admitting: Radiation Oncology

## 2017-01-07 DIAGNOSIS — Z510 Encounter for antineoplastic radiation therapy: Secondary | ICD-10-CM | POA: Diagnosis not present

## 2017-01-07 DIAGNOSIS — C07 Malignant neoplasm of parotid gland: Secondary | ICD-10-CM | POA: Diagnosis not present

## 2017-01-08 DIAGNOSIS — C07 Malignant neoplasm of parotid gland: Secondary | ICD-10-CM | POA: Diagnosis not present

## 2017-01-09 DIAGNOSIS — Z510 Encounter for antineoplastic radiation therapy: Secondary | ICD-10-CM | POA: Diagnosis not present

## 2017-01-09 DIAGNOSIS — C07 Malignant neoplasm of parotid gland: Secondary | ICD-10-CM | POA: Diagnosis not present

## 2017-01-12 DIAGNOSIS — Z23 Encounter for immunization: Secondary | ICD-10-CM | POA: Diagnosis not present

## 2017-01-18 ENCOUNTER — Telehealth: Payer: Self-pay | Admitting: General Surgery

## 2017-01-18 NOTE — Telephone Encounter (Signed)
Received a page to call regarding Sarah Dougherty- reported rash on forehead. Called the son at his listed number- no answer and no voice mail. No other numbers listed.

## 2017-01-19 ENCOUNTER — Other Ambulatory Visit: Payer: Self-pay | Admitting: *Deleted

## 2017-01-19 ENCOUNTER — Ambulatory Visit
Admission: RE | Admit: 2017-01-19 | Discharge: 2017-01-19 | Disposition: A | Discharge status: Home / Self Care | Payer: PPO | Source: Ambulatory Visit | Attending: Radiation Oncology | Admitting: Radiation Oncology

## 2017-01-19 DIAGNOSIS — C07 Malignant neoplasm of parotid gland: Secondary | ICD-10-CM

## 2017-01-20 ENCOUNTER — Ambulatory Visit
Admission: RE | Admit: 2017-01-20 | Discharge: 2017-01-20 | Disposition: A | Discharge status: Home / Self Care | Payer: PPO | Source: Ambulatory Visit | Attending: Radiation Oncology | Admitting: Radiation Oncology

## 2017-01-20 DIAGNOSIS — C07 Malignant neoplasm of parotid gland: Secondary | ICD-10-CM | POA: Diagnosis not present

## 2017-01-20 DIAGNOSIS — Z510 Encounter for antineoplastic radiation therapy: Secondary | ICD-10-CM | POA: Diagnosis not present

## 2017-01-21 ENCOUNTER — Ambulatory Visit
Admission: RE | Admit: 2017-01-21 | Discharge: 2017-01-21 | Disposition: A | Discharge status: Home / Self Care | Payer: PPO | Source: Ambulatory Visit | Attending: Radiation Oncology | Admitting: Radiation Oncology

## 2017-01-21 DIAGNOSIS — Z510 Encounter for antineoplastic radiation therapy: Secondary | ICD-10-CM | POA: Diagnosis not present

## 2017-01-21 DIAGNOSIS — C07 Malignant neoplasm of parotid gland: Secondary | ICD-10-CM | POA: Diagnosis not present

## 2017-01-22 ENCOUNTER — Ambulatory Visit
Admission: RE | Admit: 2017-01-22 | Discharge: 2017-01-22 | Disposition: A | Discharge status: Home / Self Care | Payer: PPO | Source: Ambulatory Visit | Attending: Radiation Oncology | Admitting: Radiation Oncology

## 2017-01-22 DIAGNOSIS — Z510 Encounter for antineoplastic radiation therapy: Secondary | ICD-10-CM | POA: Diagnosis not present

## 2017-01-22 DIAGNOSIS — R0602 Shortness of breath: Secondary | ICD-10-CM | POA: Diagnosis not present

## 2017-01-22 DIAGNOSIS — I4891 Unspecified atrial fibrillation: Secondary | ICD-10-CM | POA: Diagnosis not present

## 2017-01-22 DIAGNOSIS — I10 Essential (primary) hypertension: Secondary | ICD-10-CM | POA: Diagnosis not present

## 2017-01-22 DIAGNOSIS — E782 Mixed hyperlipidemia: Secondary | ICD-10-CM | POA: Diagnosis not present

## 2017-01-22 DIAGNOSIS — C07 Malignant neoplasm of parotid gland: Secondary | ICD-10-CM | POA: Diagnosis not present

## 2017-01-23 ENCOUNTER — Ambulatory Visit: Admission: RE | Admit: 2017-01-23 | Payer: PPO | Source: Ambulatory Visit

## 2017-01-26 ENCOUNTER — Ambulatory Visit
Admission: RE | Admit: 2017-01-26 | Discharge: 2017-01-26 | Disposition: A | Discharge status: Home / Self Care | Payer: PPO | Source: Ambulatory Visit | Attending: Radiation Oncology | Admitting: Radiation Oncology

## 2017-01-26 DIAGNOSIS — Z510 Encounter for antineoplastic radiation therapy: Secondary | ICD-10-CM | POA: Diagnosis not present

## 2017-01-26 DIAGNOSIS — C07 Malignant neoplasm of parotid gland: Secondary | ICD-10-CM | POA: Diagnosis not present

## 2017-01-27 ENCOUNTER — Ambulatory Visit
Admission: RE | Admit: 2017-01-27 | Discharge: 2017-01-27 | Disposition: A | Discharge status: Home / Self Care | Payer: PPO | Source: Ambulatory Visit | Attending: Radiation Oncology | Admitting: Radiation Oncology

## 2017-01-27 ENCOUNTER — Inpatient Hospital Stay: Payer: PPO | Attending: Radiation Oncology

## 2017-01-27 DIAGNOSIS — C07 Malignant neoplasm of parotid gland: Secondary | ICD-10-CM | POA: Insufficient documentation

## 2017-01-27 DIAGNOSIS — Z510 Encounter for antineoplastic radiation therapy: Secondary | ICD-10-CM | POA: Diagnosis not present

## 2017-01-27 LAB — CBC
HCT: 38.3 % (ref 35.0–47.0)
Hemoglobin: 12.5 g/dL (ref 12.0–16.0)
MCH: 29.2 pg (ref 26.0–34.0)
MCHC: 32.6 g/dL (ref 32.0–36.0)
MCV: 89.5 fL (ref 80.0–100.0)
PLATELETS: 317 10*3/uL (ref 150–440)
RBC: 4.28 MIL/uL (ref 3.80–5.20)
RDW: 14.4 % (ref 11.5–14.5)
WBC: 7.7 10*3/uL (ref 3.6–11.0)

## 2017-01-28 ENCOUNTER — Ambulatory Visit
Admission: RE | Admit: 2017-01-28 | Discharge: 2017-01-28 | Disposition: A | Discharge status: Home / Self Care | Payer: PPO | Source: Ambulatory Visit | Attending: Radiation Oncology | Admitting: Radiation Oncology

## 2017-01-28 DIAGNOSIS — C07 Malignant neoplasm of parotid gland: Secondary | ICD-10-CM | POA: Diagnosis not present

## 2017-01-28 DIAGNOSIS — Z510 Encounter for antineoplastic radiation therapy: Secondary | ICD-10-CM | POA: Diagnosis not present

## 2017-01-29 ENCOUNTER — Ambulatory Visit
Admission: RE | Admit: 2017-01-29 | Discharge: 2017-01-29 | Disposition: A | Discharge status: Home / Self Care | Payer: PPO | Source: Ambulatory Visit | Attending: Radiation Oncology | Admitting: Radiation Oncology

## 2017-01-29 DIAGNOSIS — C07 Malignant neoplasm of parotid gland: Secondary | ICD-10-CM | POA: Diagnosis not present

## 2017-01-29 DIAGNOSIS — Z510 Encounter for antineoplastic radiation therapy: Secondary | ICD-10-CM | POA: Diagnosis not present

## 2017-01-30 ENCOUNTER — Ambulatory Visit
Admission: RE | Admit: 2017-01-30 | Discharge: 2017-01-30 | Disposition: A | Discharge status: Home / Self Care | Payer: PPO | Source: Ambulatory Visit | Attending: Radiation Oncology | Admitting: Radiation Oncology

## 2017-01-30 DIAGNOSIS — Z510 Encounter for antineoplastic radiation therapy: Secondary | ICD-10-CM | POA: Diagnosis not present

## 2017-01-30 DIAGNOSIS — C07 Malignant neoplasm of parotid gland: Secondary | ICD-10-CM | POA: Diagnosis not present

## 2017-02-02 ENCOUNTER — Ambulatory Visit
Admission: RE | Admit: 2017-02-02 | Discharge: 2017-02-02 | Disposition: A | Discharge status: Home / Self Care | Payer: PPO | Source: Ambulatory Visit | Attending: Radiation Oncology | Admitting: Radiation Oncology

## 2017-02-02 DIAGNOSIS — Z510 Encounter for antineoplastic radiation therapy: Secondary | ICD-10-CM | POA: Diagnosis not present

## 2017-02-02 DIAGNOSIS — C07 Malignant neoplasm of parotid gland: Secondary | ICD-10-CM | POA: Diagnosis not present

## 2017-02-03 ENCOUNTER — Ambulatory Visit
Admission: RE | Admit: 2017-02-03 | Discharge: 2017-02-03 | Disposition: A | Discharge status: Home / Self Care | Payer: PPO | Source: Ambulatory Visit | Attending: Radiation Oncology | Admitting: Radiation Oncology

## 2017-02-03 ENCOUNTER — Inpatient Hospital Stay: Payer: PPO

## 2017-02-03 DIAGNOSIS — C07 Malignant neoplasm of parotid gland: Secondary | ICD-10-CM | POA: Diagnosis not present

## 2017-02-03 DIAGNOSIS — Z510 Encounter for antineoplastic radiation therapy: Secondary | ICD-10-CM | POA: Diagnosis not present

## 2017-02-03 LAB — CBC
HCT: 37.6 % (ref 35.0–47.0)
Hemoglobin: 12.1 g/dL (ref 12.0–16.0)
MCH: 29.3 pg (ref 26.0–34.0)
MCHC: 32.3 g/dL (ref 32.0–36.0)
MCV: 90.6 fL (ref 80.0–100.0)
Platelets: 282 10*3/uL (ref 150–440)
RBC: 4.15 MIL/uL (ref 3.80–5.20)
RDW: 15.3 % — AB (ref 11.5–14.5)
WBC: 6.6 10*3/uL (ref 3.6–11.0)

## 2017-02-04 ENCOUNTER — Ambulatory Visit: Payer: PPO

## 2017-02-04 ENCOUNTER — Ambulatory Visit
Admission: RE | Admit: 2017-02-04 | Discharge: 2017-02-04 | Disposition: A | Discharge status: Home / Self Care | Payer: PPO | Source: Ambulatory Visit | Attending: Radiation Oncology | Admitting: Radiation Oncology

## 2017-02-04 DIAGNOSIS — Z510 Encounter for antineoplastic radiation therapy: Secondary | ICD-10-CM | POA: Diagnosis not present

## 2017-02-04 DIAGNOSIS — C07 Malignant neoplasm of parotid gland: Secondary | ICD-10-CM | POA: Diagnosis not present

## 2017-02-05 ENCOUNTER — Ambulatory Visit: Payer: PPO

## 2017-02-06 ENCOUNTER — Ambulatory Visit: Payer: PPO

## 2017-02-09 ENCOUNTER — Ambulatory Visit
Admission: RE | Admit: 2017-02-09 | Discharge: 2017-02-09 | Disposition: A | Discharge status: Home / Self Care | Payer: PPO | Source: Ambulatory Visit | Attending: Radiation Oncology | Admitting: Radiation Oncology

## 2017-02-09 DIAGNOSIS — Z510 Encounter for antineoplastic radiation therapy: Secondary | ICD-10-CM | POA: Diagnosis not present

## 2017-02-09 DIAGNOSIS — C07 Malignant neoplasm of parotid gland: Secondary | ICD-10-CM | POA: Diagnosis not present

## 2017-02-10 ENCOUNTER — Inpatient Hospital Stay: Payer: PPO

## 2017-02-10 ENCOUNTER — Ambulatory Visit
Admission: RE | Admit: 2017-02-10 | Discharge: 2017-02-10 | Disposition: A | Discharge status: Home / Self Care | Payer: PPO | Source: Ambulatory Visit | Attending: Radiation Oncology | Admitting: Radiation Oncology

## 2017-02-10 DIAGNOSIS — Z510 Encounter for antineoplastic radiation therapy: Secondary | ICD-10-CM | POA: Diagnosis not present

## 2017-02-10 DIAGNOSIS — C07 Malignant neoplasm of parotid gland: Secondary | ICD-10-CM

## 2017-02-10 LAB — CBC
HCT: 37.7 % (ref 35.0–47.0)
HEMOGLOBIN: 12.3 g/dL (ref 12.0–16.0)
MCH: 29.6 pg (ref 26.0–34.0)
MCHC: 32.6 g/dL (ref 32.0–36.0)
MCV: 90.6 fL (ref 80.0–100.0)
PLATELETS: 219 10*3/uL (ref 150–440)
RBC: 4.16 MIL/uL (ref 3.80–5.20)
RDW: 15.7 % — ABNORMAL HIGH (ref 11.5–14.5)
WBC: 8.6 10*3/uL (ref 3.6–11.0)

## 2017-02-11 ENCOUNTER — Ambulatory Visit
Admission: RE | Admit: 2017-02-11 | Discharge: 2017-02-11 | Disposition: A | Discharge status: Home / Self Care | Payer: PPO | Source: Ambulatory Visit | Attending: Radiation Oncology | Admitting: Radiation Oncology

## 2017-02-11 DIAGNOSIS — C07 Malignant neoplasm of parotid gland: Secondary | ICD-10-CM | POA: Diagnosis not present

## 2017-02-11 DIAGNOSIS — Z510 Encounter for antineoplastic radiation therapy: Secondary | ICD-10-CM | POA: Diagnosis not present

## 2017-02-12 ENCOUNTER — Ambulatory Visit
Admission: RE | Admit: 2017-02-12 | Discharge: 2017-02-12 | Disposition: A | Discharge status: Home / Self Care | Payer: PPO | Source: Ambulatory Visit | Attending: Radiation Oncology | Admitting: Radiation Oncology

## 2017-02-12 DIAGNOSIS — C07 Malignant neoplasm of parotid gland: Secondary | ICD-10-CM | POA: Diagnosis not present

## 2017-02-12 DIAGNOSIS — Z510 Encounter for antineoplastic radiation therapy: Secondary | ICD-10-CM | POA: Diagnosis not present

## 2017-02-13 ENCOUNTER — Ambulatory Visit
Admission: RE | Admit: 2017-02-13 | Discharge: 2017-02-13 | Disposition: A | Discharge status: Home / Self Care | Payer: PPO | Source: Ambulatory Visit | Attending: Radiation Oncology | Admitting: Radiation Oncology

## 2017-02-13 DIAGNOSIS — C07 Malignant neoplasm of parotid gland: Secondary | ICD-10-CM | POA: Diagnosis not present

## 2017-02-13 DIAGNOSIS — Z510 Encounter for antineoplastic radiation therapy: Secondary | ICD-10-CM | POA: Diagnosis not present

## 2017-02-16 ENCOUNTER — Ambulatory Visit
Admission: RE | Admit: 2017-02-16 | Discharge: 2017-02-16 | Disposition: A | Discharge status: Home / Self Care | Payer: PPO | Source: Ambulatory Visit | Attending: Radiation Oncology | Admitting: Radiation Oncology

## 2017-02-16 DIAGNOSIS — C07 Malignant neoplasm of parotid gland: Secondary | ICD-10-CM | POA: Diagnosis not present

## 2017-02-16 DIAGNOSIS — Z510 Encounter for antineoplastic radiation therapy: Secondary | ICD-10-CM | POA: Diagnosis not present

## 2017-02-17 ENCOUNTER — Ambulatory Visit
Admission: RE | Admit: 2017-02-17 | Discharge: 2017-02-17 | Disposition: A | Discharge status: Home / Self Care | Payer: PPO | Source: Ambulatory Visit | Attending: Radiation Oncology | Admitting: Radiation Oncology

## 2017-02-17 ENCOUNTER — Inpatient Hospital Stay: Payer: PPO | Attending: Radiation Oncology

## 2017-02-17 DIAGNOSIS — C07 Malignant neoplasm of parotid gland: Secondary | ICD-10-CM | POA: Insufficient documentation

## 2017-02-17 DIAGNOSIS — Z510 Encounter for antineoplastic radiation therapy: Secondary | ICD-10-CM | POA: Diagnosis not present

## 2017-02-17 LAB — CBC
HEMATOCRIT: 37.9 % (ref 35.0–47.0)
Hemoglobin: 12.5 g/dL (ref 12.0–16.0)
MCH: 29.6 pg (ref 26.0–34.0)
MCHC: 32.9 g/dL (ref 32.0–36.0)
MCV: 89.7 fL (ref 80.0–100.0)
Platelets: 210 10*3/uL (ref 150–440)
RBC: 4.22 MIL/uL (ref 3.80–5.20)
RDW: 15.8 % — AB (ref 11.5–14.5)
WBC: 9.4 10*3/uL (ref 3.6–11.0)

## 2017-02-18 ENCOUNTER — Ambulatory Visit
Admission: RE | Admit: 2017-02-18 | Discharge: 2017-02-18 | Disposition: A | Discharge status: Home / Self Care | Payer: PPO | Source: Ambulatory Visit | Attending: Radiation Oncology | Admitting: Radiation Oncology

## 2017-02-18 DIAGNOSIS — C07 Malignant neoplasm of parotid gland: Secondary | ICD-10-CM | POA: Diagnosis not present

## 2017-02-18 DIAGNOSIS — Z510 Encounter for antineoplastic radiation therapy: Secondary | ICD-10-CM | POA: Diagnosis not present

## 2017-02-19 ENCOUNTER — Ambulatory Visit
Admission: RE | Admit: 2017-02-19 | Discharge: 2017-02-19 | Disposition: A | Discharge status: Home / Self Care | Payer: PPO | Source: Ambulatory Visit | Attending: Radiation Oncology | Admitting: Radiation Oncology

## 2017-02-19 DIAGNOSIS — Z510 Encounter for antineoplastic radiation therapy: Secondary | ICD-10-CM | POA: Diagnosis not present

## 2017-02-19 DIAGNOSIS — C07 Malignant neoplasm of parotid gland: Secondary | ICD-10-CM | POA: Diagnosis not present

## 2017-02-20 ENCOUNTER — Ambulatory Visit
Admission: RE | Admit: 2017-02-20 | Discharge: 2017-02-20 | Disposition: A | Discharge status: Home / Self Care | Payer: PPO | Source: Ambulatory Visit | Attending: Radiation Oncology | Admitting: Radiation Oncology

## 2017-02-20 DIAGNOSIS — Z510 Encounter for antineoplastic radiation therapy: Secondary | ICD-10-CM | POA: Diagnosis not present

## 2017-02-20 DIAGNOSIS — C07 Malignant neoplasm of parotid gland: Secondary | ICD-10-CM | POA: Diagnosis not present

## 2017-02-23 ENCOUNTER — Ambulatory Visit
Admission: RE | Admit: 2017-02-23 | Discharge: 2017-02-23 | Disposition: A | Discharge status: Home / Self Care | Payer: PPO | Source: Ambulatory Visit | Attending: Radiation Oncology | Admitting: Radiation Oncology

## 2017-02-23 ENCOUNTER — Ambulatory Visit: Payer: PPO

## 2017-02-23 DIAGNOSIS — C07 Malignant neoplasm of parotid gland: Secondary | ICD-10-CM | POA: Diagnosis not present

## 2017-02-23 DIAGNOSIS — Z510 Encounter for antineoplastic radiation therapy: Secondary | ICD-10-CM | POA: Diagnosis not present

## 2017-02-24 ENCOUNTER — Ambulatory Visit: Payer: PPO

## 2017-02-24 ENCOUNTER — Ambulatory Visit
Admission: RE | Admit: 2017-02-24 | Discharge: 2017-02-24 | Disposition: A | Discharge status: Home / Self Care | Payer: PPO | Source: Ambulatory Visit | Attending: Radiation Oncology | Admitting: Radiation Oncology

## 2017-02-24 DIAGNOSIS — C07 Malignant neoplasm of parotid gland: Secondary | ICD-10-CM | POA: Diagnosis not present

## 2017-02-24 DIAGNOSIS — Z510 Encounter for antineoplastic radiation therapy: Secondary | ICD-10-CM | POA: Diagnosis not present

## 2017-02-25 ENCOUNTER — Ambulatory Visit
Admission: RE | Admit: 2017-02-25 | Discharge: 2017-02-25 | Disposition: A | Discharge status: Home / Self Care | Payer: PPO | Source: Ambulatory Visit | Attending: Radiation Oncology | Admitting: Radiation Oncology

## 2017-02-25 DIAGNOSIS — Z510 Encounter for antineoplastic radiation therapy: Secondary | ICD-10-CM | POA: Diagnosis not present

## 2017-02-25 DIAGNOSIS — C07 Malignant neoplasm of parotid gland: Secondary | ICD-10-CM | POA: Diagnosis not present

## 2017-02-26 ENCOUNTER — Ambulatory Visit
Admission: RE | Admit: 2017-02-26 | Discharge: 2017-02-26 | Disposition: A | Discharge status: Home / Self Care | Payer: PPO | Source: Ambulatory Visit | Attending: Radiation Oncology | Admitting: Radiation Oncology

## 2017-02-26 DIAGNOSIS — C07 Malignant neoplasm of parotid gland: Secondary | ICD-10-CM | POA: Diagnosis not present

## 2017-02-26 DIAGNOSIS — Z510 Encounter for antineoplastic radiation therapy: Secondary | ICD-10-CM | POA: Diagnosis not present

## 2017-03-19 DIAGNOSIS — C07 Malignant neoplasm of parotid gland: Secondary | ICD-10-CM | POA: Diagnosis not present

## 2017-03-24 ENCOUNTER — Other Ambulatory Visit: Payer: Self-pay

## 2017-03-24 ENCOUNTER — Emergency Department: Payer: PPO

## 2017-03-24 ENCOUNTER — Encounter: Payer: Self-pay | Admitting: *Deleted

## 2017-03-24 ENCOUNTER — Emergency Department
Admission: EM | Admit: 2017-03-24 | Discharge: 2017-03-24 | Disposition: A | Discharge status: Home / Self Care | Payer: PPO | Attending: Emergency Medicine | Admitting: Emergency Medicine

## 2017-03-24 DIAGNOSIS — W0110XA Fall on same level from slipping, tripping and stumbling with subsequent striking against unspecified object, initial encounter: Secondary | ICD-10-CM | POA: Insufficient documentation

## 2017-03-24 DIAGNOSIS — Y999 Unspecified external cause status: Secondary | ICD-10-CM | POA: Diagnosis not present

## 2017-03-24 DIAGNOSIS — Z7984 Long term (current) use of oral hypoglycemic drugs: Secondary | ICD-10-CM | POA: Diagnosis not present

## 2017-03-24 DIAGNOSIS — E1122 Type 2 diabetes mellitus with diabetic chronic kidney disease: Secondary | ICD-10-CM | POA: Diagnosis not present

## 2017-03-24 DIAGNOSIS — Z79899 Other long term (current) drug therapy: Secondary | ICD-10-CM | POA: Insufficient documentation

## 2017-03-24 DIAGNOSIS — S0031XA Abrasion of nose, initial encounter: Secondary | ICD-10-CM | POA: Diagnosis not present

## 2017-03-24 DIAGNOSIS — Y9301 Activity, walking, marching and hiking: Secondary | ICD-10-CM | POA: Diagnosis not present

## 2017-03-24 DIAGNOSIS — Y929 Unspecified place or not applicable: Secondary | ICD-10-CM | POA: Insufficient documentation

## 2017-03-24 DIAGNOSIS — S0990XA Unspecified injury of head, initial encounter: Secondary | ICD-10-CM | POA: Diagnosis not present

## 2017-03-24 DIAGNOSIS — Z23 Encounter for immunization: Secondary | ICD-10-CM | POA: Insufficient documentation

## 2017-03-24 DIAGNOSIS — S0083XA Contusion of other part of head, initial encounter: Secondary | ICD-10-CM | POA: Diagnosis not present

## 2017-03-24 DIAGNOSIS — J449 Chronic obstructive pulmonary disease, unspecified: Secondary | ICD-10-CM | POA: Diagnosis not present

## 2017-03-24 DIAGNOSIS — Z950 Presence of cardiac pacemaker: Secondary | ICD-10-CM | POA: Insufficient documentation

## 2017-03-24 DIAGNOSIS — J45909 Unspecified asthma, uncomplicated: Secondary | ICD-10-CM | POA: Insufficient documentation

## 2017-03-24 DIAGNOSIS — N189 Chronic kidney disease, unspecified: Secondary | ICD-10-CM | POA: Diagnosis not present

## 2017-03-24 DIAGNOSIS — W19XXXA Unspecified fall, initial encounter: Secondary | ICD-10-CM

## 2017-03-24 DIAGNOSIS — S199XXA Unspecified injury of neck, initial encounter: Secondary | ICD-10-CM | POA: Diagnosis not present

## 2017-03-24 DIAGNOSIS — I2510 Atherosclerotic heart disease of native coronary artery without angina pectoris: Secondary | ICD-10-CM | POA: Insufficient documentation

## 2017-03-24 DIAGNOSIS — I129 Hypertensive chronic kidney disease with stage 1 through stage 4 chronic kidney disease, or unspecified chronic kidney disease: Secondary | ICD-10-CM | POA: Diagnosis not present

## 2017-03-24 MED ORDER — TETANUS-DIPHTH-ACELL PERTUSSIS 5-2.5-18.5 LF-MCG/0.5 IM SUSP
0.5000 mL | Freq: Once | INTRAMUSCULAR | Status: AC
  Administered 2017-03-24: 0.5 mL via INTRAMUSCULAR
  Filled 2017-03-24: qty 0.5

## 2017-03-24 MED ORDER — ACETAMINOPHEN 500 MG PO TABS
1000.0000 mg | ORAL_TABLET | Freq: Once | ORAL | Status: AC
  Administered 2017-03-24: 1000 mg via ORAL
  Filled 2017-03-24: qty 2

## 2017-03-24 NOTE — ED Triage Notes (Signed)
Pt to ED after having tripped on shoes. PT fell and reports having hit forehead on brick fireplace and nose on the ground. Bleeding noted from nose and bruising started on forehead. Hx of head bleed after fall years ago. No LOC today. PT is alert and oriented. Left eye is reactive, right eye is fake. No neuro deficits noted.

## 2017-03-24 NOTE — ED Provider Notes (Signed)
[facility] Emergency Department Provider Note  ____________________________________________  Time seen: Approximately 1:21 PM  I have reviewed the triage vital signs and the nursing notes.   HISTORY  Chief Complaint Fall   HPI Sarah Dougherty is a 83 y.o. female with a history of A. fib on Eliquis, chronic kidney disease, diabetes, CAD, hypertension, hyperlipidemia, COPD who presents for evaluation of face trauma. Patient reports that she was trying to get to the window in her living room when she tripped on a shoe and fell face forward onto the brick fireplace. She denies LOC. She is complaining of mild constant for head pain. No changes in vision, no neck pain, no no back pain, no extremity pain, no chest pain, no abdominal pain. Patient reports that the fall was mechanical in nature with no preceding symptoms such as dizziness, palpitations, headache or chest pain. Fall was witnessed by family members were at the bedside. Unknown last tetanus shot.   Past Medical History:  Diagnosis Date  . A-fib ([place])   . Arthritis    Osteoarthritis  . Asthma   . Chronic kidney disease    UTI  . COPD (chronic obstructive pulmonary disease) ([place])   . Coronary artery disease   . Diabetes mellitus without complication ([place])   . GERD (gastroesophageal reflux disease)   . Glaucoma (increased eye pressure)   . HOH (hard of hearing)    Left Hearing Aid  . Hypertension   . Hyperthyroidism   . On home oxygen therapy    uses at night  . Presence of permanent cardiac pacemaker   . Sleep apnea    No C-PAP    Patient Active Problem List   Diagnosis Date Noted  . 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 COPD exacerbation ([place]) 04/07/2016  . Squamous cell carcinoma 03/19/2016  . Bradycardia 12/10/2014  . COPD (chronic obstructive pulmonary disease) ([place]) 12/09/2014    Past Surgical History:  Procedure Laterality Date  . ABDOMINAL HYSTERECTOMY    .  EYE SURGERY Left    Cataract Extraction with IOL  . EYE SURGERY Right    Artificial Eye  . INSERT / REPLACE / REMOVE PACEMAKER    . PACEMAKER INSERTION Left 12/12/2014   Procedure: INSERTION PACEMAKER;  Surgeon: Isaias Cowman, MD;  Location: [facility] ORS;  Service: Cardiovascular;  Laterality: Left;  . PAROTIDECTOMY Left 12/10/2016   Procedure: PAROTIDECTOMY;  Surgeon: Clyde Canterbury, MD;  Location: [facility] ORS;  Service: ENT;  Laterality: Left;  . RADICAL NECK DISSECTION Left 12/10/2016   Procedure: RADICAL NECK DISSECTION;  Surgeon: Clyde Canterbury, MD;  Location: [facility] ORS;  Service: ENT;  Laterality: Left;    Prior to Admission medications   Medication Sig Start Date End Date Taking? Authorizing Provider  acetaminophen (TYLENOL 8 HOUR) 650 MG CR tablet Take 650 mg by mouth every 8 (eight) hours as needed for pain.    [provider]  albuterol (PROAIR HFA) 108 (90 Base) MCG/ACT inhaler Inhale 1 puff into the lungs every 6 (six) hours as needed for wheezing or shortness of breath.    [provider]  amiodarone (PACERONE) 200 MG tablet Take 100 mg by mouth daily.    [provider]  amLODipine (NORVASC) 5 MG tablet Take 5 mg by mouth daily. 11/13/14   [provider]  atorvastatin (LIPITOR) 20 MG tablet Take 20 mg by mouth daily.    [provider]  brimonidine (ALPHAGAN) 0.2 % ophthalmic solution Place 1  drop into the left eye 2 (two) times daily.    [provider]  Cholecalciferol (VITAMIN D) 2000 units tablet Take 2,000 Units by mouth daily.    [provider]  clotrimazole (GYNE-LOTRIMIN) 1 % vaginal cream Place 1 application vaginally daily as needed. For yeast infection. 11/15/14   [provider]  glimepiride (AMARYL) 2 MG tablet Take 2 mg by mouth daily. 10/18/14   [provider]  hydrocortisone cream (NEOSPORIN ECZEMA ESSENT MAX ST) 1 % Apply 1 application topically 2 (two) times daily as needed for itching (for  sores.).    [provider]  ipratropium-albuterol (DUONEB) 0.5-2.5 (3) MG/3ML SOLN Take 3 mLs by nebulization every 4 (four) hours as needed. Patient not taking: Reported on 12/01/2016 04/10/16   Hower, Aaron Mose, MD  sodium chloride (OCEAN) 0.65 % nasal spray Place 2 sprays into the nose daily as needed. 09/14/14   [provider]  travoprost, benzalkonium, (TRAVATAN) 0.004 % ophthalmic solution Place 1 drop into the left eye at bedtime.    [provider]    Allergies Amoxicillin; Cephalexin; Nitrofurantoin; Penicillins; Sulfa antibiotics; and Azithromycin  Family History  Problem Relation Age of Onset  . Diabetes Brother   . Diabetes Son     Social History Social History   Tobacco Use  . Smoking status: Never Smoker  . Smokeless tobacco: Never Used  Substance Use Topics  . Alcohol use: No  . Drug use: No    Review of Systems Constitutional: Negative for fever. Eyes: Negative for visual changes. ENT: + facial injury. No neck injury Cardiovascular: Negative for chest injury. Respiratory: Negative for shortness of breath. Negative for chest wall injury. Gastrointestinal: Negative for abdominal pain or injury. Genitourinary: Negative for dysuria. Musculoskeletal: Negative for back injury, negative for arm or leg pain. Skin: Negative for laceration/abrasions. Neurological: + head injury.   ____________________________________________   PHYSICAL EXAM:  VITAL SIGNS: ED Triage Vitals  Enc Vitals Group     BP 03/24/17 1122 (!) 143/76     Pulse Rate 03/24/17 1122 63     Resp 03/24/17 1122 16     Temp 03/24/17 1122 98.7 F (37.1 C)     Temp Source 03/24/17 1122 Oral     SpO2 03/24/17 1115 97 %     Weight 03/24/17 1122 112 lb 6.4 oz (51 kg)     Height --      Head Circumference --      Peak Flow --      Pain Score 03/24/17 1120 2     Pain Loc --      Pain Edu? --      Excl. in [place]? --     Constitutional: Alert and oriented. No acute  distress. Does not appear intoxicated. HEENT Head: Normocephalic, L forehead hematoma Face: No facial bony tenderness. Stable midface. Bruising on b/l face Ears: No hemotympanum bilaterally. No Battle sign Eyes: No eye injury. PERRL. No raccoon eyes. R eye prosthesis Nose: tender with abrasion to nose bridge. No epistaxis. No rhinorrhea Mouth/Throat: Mucous membranes are moist. No oropharyngeal blood. No dental injury. Airway patent without stridor. Normal voice. Neck: no C-collar in place. No midline c-spine tenderness.  Cardiovascular: Normal rate, regular rhythm. Normal and symmetric distal pulses are present in all extremities. Pulmonary/Chest: Chest wall is stable and nontender to palpation/compression. Normal respiratory effort. Breath sounds are normal. No crepitus.  Abdominal: Soft, nontender, non distended. Musculoskeletal: Nontender with normal full range of motion in all extremities. No  deformities. No thoracic or lumbar midline spinal tenderness. Pelvis is stable. Skin: Skin is warm, dry and intact. No abrasions or contutions. Psychiatric: Speech and behavior are appropriate. Neurological: Normal speech and language. Moves all extremities to command. No gross focal neurologic deficits are appreciated.  Glascow Coma Score: 4 - Opens eyes on own 6 - Follows simple motor commands 5 - Alert and oriented GCS: 15  ____________________________________________   LABS (all labs ordered are listed, but only abnormal results are displayed)  Labs Reviewed - No data to display ____________________________________________  EKG  none ____________________________________________  RADIOLOGY  CT head/ cspine/ face:  1. No acute intracranial pathology. 2. No acute osseous injury of the maxillofacial bones. 3. No acute osseous injury of the cervical spine. 4. Cervical spine spondylosis as described above. ____________________________________________   PROCEDURES  Procedure(s)  performed: None Procedures Critical Care performed:  None ____________________________________________   INITIAL IMPRESSION / ASSESSMENT AND PLAN / ED COURSE   83 y.o. female with a history of A. fib on Eliquis, chronic kidney disease, diabetes, CAD, hypertension, hyperlipidemia, COPD who presents for evaluation of face trauma. CT head, C-spine and maxillofacial with no acute injuries. Patient with a nose abrasion which was clean and wound dressing with steristrips. Tetanus boost given. Tylenol given with great control of pain. Discuss signs and symptoms of delayed head bleed with patient and her son who was at the bedside and recommended that she return if those develop. At this time patient is currently discharged to the care of the family.       As part of my medical decision making, I reviewed the following data within the [facility] notes reviewed and incorporated, Radiograph reviewed , Notes from prior ED visits and [place] Controlled Substance Database    Pertinent labs & imaging results that were available during my care of the patient were reviewed by me and considered in my medical decision making (see chart for details).    ____________________________________________   FINAL CLINICAL IMPRESSION(S) / ED DIAGNOSES  Final diagnoses:  Fall, initial encounter  Contusion of face, initial encounter  Injury of head, initial encounter  Abrasion of nose, initial encounter      NEW MEDICATIONS STARTED DURING THIS VISIT:  ED Discharge Orders    None       Note:  This document was prepared using Dragon voice recognition software and may include unintentional dictation errors.    Rudene Re, MD 03/24/17 [phone]

## 2017-03-24 NOTE — ED Notes (Signed)
Pt has returned from CT. Pt continues to have no noted neuro deficits. Pt in NAD at this time. Family and pt updated on treatment plan. Wound cleaned at this time.

## 2017-03-24 NOTE — ED Notes (Signed)
Pt taken to car via Wheelchair. NAD at this time.

## 2017-03-24 NOTE — ED Notes (Signed)
Pt family verbalized concern that pt could have a "cold" or "pneumonia" Pt was reported to have a productive cough for over a week. Pts son is requesting a chest Xray because he reports pt was placed on Z-pack by PCP and has not improved.

## 2017-03-24 NOTE — Discharge Instructions (Signed)
You were seen in the emergency department after a fall. Luckily all of your imaging studies did not show any evidence of injuries. Follow-up with you doctor within the next 2-3 days for further evaluation. Sometimes injuries can present at a later time and therefore it is imperative that you return to the emergency room if you have a severe headache, facial droop, neck pain, numbness or weakness of your extremities, slurred speech, difficulty finding words, chest pain, back pain, abdominal pain, or any other new symptoms that were not present during this visit. You may take Tylenol 1000mg  (2 extra strength) three times at day at home for your pain.

## 2017-03-24 NOTE — ED Notes (Signed)
Patient transported to CT 

## 2017-03-30 ENCOUNTER — Ambulatory Visit: Payer: PPO | Admitting: Radiation Oncology

## 2017-03-30 DIAGNOSIS — I10 Essential (primary) hypertension: Secondary | ICD-10-CM | POA: Diagnosis not present

## 2017-03-30 DIAGNOSIS — E782 Mixed hyperlipidemia: Secondary | ICD-10-CM | POA: Diagnosis not present

## 2017-03-30 DIAGNOSIS — Z23 Encounter for immunization: Secondary | ICD-10-CM | POA: Diagnosis not present

## 2017-03-30 DIAGNOSIS — S0993XA Unspecified injury of face, initial encounter: Secondary | ICD-10-CM | POA: Diagnosis not present

## 2017-03-30 DIAGNOSIS — E119 Type 2 diabetes mellitus without complications: Secondary | ICD-10-CM | POA: Diagnosis not present

## 2017-03-30 DIAGNOSIS — I4891 Unspecified atrial fibrillation: Secondary | ICD-10-CM | POA: Diagnosis not present

## 2017-04-02 DIAGNOSIS — S0990XA Unspecified injury of head, initial encounter: Secondary | ICD-10-CM | POA: Diagnosis not present

## 2017-04-03 ENCOUNTER — Other Ambulatory Visit: Payer: Self-pay

## 2017-04-03 ENCOUNTER — Encounter: Payer: Self-pay | Admitting: *Deleted

## 2017-04-03 ENCOUNTER — Inpatient Hospital Stay
Admission: EM | Admit: 2017-04-03 | Discharge: 2017-04-09 | DRG: 470 | Disposition: A | Discharge status: Skilled Nursing Facility | Payer: PPO | Attending: Internal Medicine | Admitting: Internal Medicine

## 2017-04-03 ENCOUNTER — Emergency Department: Payer: PPO

## 2017-04-03 DIAGNOSIS — R0902 Hypoxemia: Secondary | ICD-10-CM | POA: Diagnosis not present

## 2017-04-03 DIAGNOSIS — Y92010 Kitchen of single-family (private) house as the place of occurrence of the external cause: Secondary | ICD-10-CM | POA: Diagnosis not present

## 2017-04-03 DIAGNOSIS — E0590 Thyrotoxicosis, unspecified without thyrotoxic crisis or storm: Secondary | ICD-10-CM | POA: Diagnosis present

## 2017-04-03 DIAGNOSIS — Z7984 Long term (current) use of oral hypoglycemic drugs: Secondary | ICD-10-CM | POA: Diagnosis not present

## 2017-04-03 DIAGNOSIS — Z9981 Dependence on supplemental oxygen: Secondary | ICD-10-CM | POA: Diagnosis not present

## 2017-04-03 DIAGNOSIS — H409 Unspecified glaucoma: Secondary | ICD-10-CM | POA: Diagnosis not present

## 2017-04-03 DIAGNOSIS — N189 Chronic kidney disease, unspecified: Secondary | ICD-10-CM | POA: Diagnosis present

## 2017-04-03 DIAGNOSIS — Z7901 Long term (current) use of anticoagulants: Secondary | ICD-10-CM

## 2017-04-03 DIAGNOSIS — G4730 Sleep apnea, unspecified: Secondary | ICD-10-CM | POA: Diagnosis not present

## 2017-04-03 DIAGNOSIS — I482 Chronic atrial fibrillation: Secondary | ICD-10-CM | POA: Diagnosis present

## 2017-04-03 DIAGNOSIS — J449 Chronic obstructive pulmonary disease, unspecified: Secondary | ICD-10-CM | POA: Diagnosis not present

## 2017-04-03 DIAGNOSIS — I2510 Atherosclerotic heart disease of native coronary artery without angina pectoris: Secondary | ICD-10-CM | POA: Diagnosis not present

## 2017-04-03 DIAGNOSIS — K219 Gastro-esophageal reflux disease without esophagitis: Secondary | ICD-10-CM | POA: Diagnosis not present

## 2017-04-03 DIAGNOSIS — E119 Type 2 diabetes mellitus without complications: Secondary | ICD-10-CM | POA: Diagnosis not present

## 2017-04-03 DIAGNOSIS — S72001D Fracture of unspecified part of neck of right femur, subsequent encounter for closed fracture with routine healing: Secondary | ICD-10-CM | POA: Diagnosis not present

## 2017-04-03 DIAGNOSIS — Z888 Allergy status to other drugs, medicaments and biological substances status: Secondary | ICD-10-CM | POA: Diagnosis not present

## 2017-04-03 DIAGNOSIS — S72091A Other fracture of head and neck of right femur, initial encounter for closed fracture: Secondary | ICD-10-CM | POA: Diagnosis not present

## 2017-04-03 DIAGNOSIS — Z882 Allergy status to sulfonamides status: Secondary | ICD-10-CM | POA: Diagnosis not present

## 2017-04-03 DIAGNOSIS — H9190 Unspecified hearing loss, unspecified ear: Secondary | ICD-10-CM | POA: Diagnosis not present

## 2017-04-03 DIAGNOSIS — Z950 Presence of cardiac pacemaker: Secondary | ICD-10-CM

## 2017-04-03 DIAGNOSIS — H44001 Unspecified purulent endophthalmitis, right eye: Secondary | ICD-10-CM | POA: Diagnosis present

## 2017-04-03 DIAGNOSIS — Z538 Procedure and treatment not carried out for other reasons: Secondary | ICD-10-CM | POA: Diagnosis not present

## 2017-04-03 DIAGNOSIS — S72041A Displaced fracture of base of neck of right femur, initial encounter for closed fracture: Secondary | ICD-10-CM | POA: Diagnosis not present

## 2017-04-03 DIAGNOSIS — Z961 Presence of intraocular lens: Secondary | ICD-10-CM | POA: Diagnosis not present

## 2017-04-03 DIAGNOSIS — Z833 Family history of diabetes mellitus: Secondary | ICD-10-CM

## 2017-04-03 DIAGNOSIS — E785 Hyperlipidemia, unspecified: Secondary | ICD-10-CM | POA: Diagnosis not present

## 2017-04-03 DIAGNOSIS — S72009A Fracture of unspecified part of neck of unspecified femur, initial encounter for closed fracture: Secondary | ICD-10-CM | POA: Diagnosis present

## 2017-04-03 DIAGNOSIS — Z9842 Cataract extraction status, left eye: Secondary | ICD-10-CM | POA: Diagnosis not present

## 2017-04-03 DIAGNOSIS — S299XXA Unspecified injury of thorax, initial encounter: Secondary | ICD-10-CM | POA: Diagnosis not present

## 2017-04-03 DIAGNOSIS — Z90710 Acquired absence of both cervix and uterus: Secondary | ICD-10-CM | POA: Diagnosis not present

## 2017-04-03 DIAGNOSIS — N839 Noninflammatory disorder of ovary, fallopian tube and broad ligament, unspecified: Secondary | ICD-10-CM | POA: Diagnosis not present

## 2017-04-03 DIAGNOSIS — M1990 Unspecified osteoarthritis, unspecified site: Secondary | ICD-10-CM | POA: Diagnosis present

## 2017-04-03 DIAGNOSIS — I129 Hypertensive chronic kidney disease with stage 1 through stage 4 chronic kidney disease, or unspecified chronic kidney disease: Secondary | ICD-10-CM | POA: Diagnosis present

## 2017-04-03 DIAGNOSIS — Z419 Encounter for procedure for purposes other than remedying health state, unspecified: Secondary | ICD-10-CM

## 2017-04-03 DIAGNOSIS — E039 Hypothyroidism, unspecified: Secondary | ICD-10-CM | POA: Diagnosis not present

## 2017-04-03 DIAGNOSIS — W1830XA Fall on same level, unspecified, initial encounter: Secondary | ICD-10-CM | POA: Diagnosis present

## 2017-04-03 DIAGNOSIS — D62 Acute posthemorrhagic anemia: Secondary | ICD-10-CM | POA: Diagnosis not present

## 2017-04-03 DIAGNOSIS — S72001A Fracture of unspecified part of neck of right femur, initial encounter for closed fracture: Secondary | ICD-10-CM | POA: Diagnosis not present

## 2017-04-03 DIAGNOSIS — E1122 Type 2 diabetes mellitus with diabetic chronic kidney disease: Secondary | ICD-10-CM | POA: Diagnosis not present

## 2017-04-03 DIAGNOSIS — Z9841 Cataract extraction status, right eye: Secondary | ICD-10-CM | POA: Diagnosis not present

## 2017-04-03 DIAGNOSIS — W19XXXD Unspecified fall, subsequent encounter: Secondary | ICD-10-CM | POA: Diagnosis not present

## 2017-04-03 DIAGNOSIS — I10 Essential (primary) hypertension: Secondary | ICD-10-CM | POA: Diagnosis not present

## 2017-04-03 DIAGNOSIS — Z881 Allergy status to other antibiotic agents status: Secondary | ICD-10-CM | POA: Diagnosis not present

## 2017-04-03 DIAGNOSIS — M25551 Pain in right hip: Secondary | ICD-10-CM | POA: Diagnosis present

## 2017-04-03 DIAGNOSIS — Z539 Procedure and treatment not carried out, unspecified reason: Secondary | ICD-10-CM | POA: Diagnosis not present

## 2017-04-03 DIAGNOSIS — Z880 Allergy status to penicillin: Secondary | ICD-10-CM

## 2017-04-03 DIAGNOSIS — Z96649 Presence of unspecified artificial hip joint: Secondary | ICD-10-CM

## 2017-04-03 DIAGNOSIS — Z471 Aftercare following joint replacement surgery: Secondary | ICD-10-CM | POA: Diagnosis not present

## 2017-04-03 DIAGNOSIS — W19XXXA Unspecified fall, initial encounter: Secondary | ICD-10-CM

## 2017-04-03 DIAGNOSIS — Z96641 Presence of right artificial hip joint: Secondary | ICD-10-CM | POA: Diagnosis not present

## 2017-04-03 DIAGNOSIS — I4891 Unspecified atrial fibrillation: Secondary | ICD-10-CM | POA: Diagnosis not present

## 2017-04-03 LAB — CBC WITH DIFFERENTIAL/PLATELET
BASOS PCT: 1 %
Basophils Absolute: 0.1 10*3/uL (ref 0–0.1)
Eosinophils Absolute: 0 10*3/uL (ref 0–0.7)
Eosinophils Relative: 0 %
HEMATOCRIT: 38.4 % (ref 35.0–47.0)
HEMOGLOBIN: 12.7 g/dL (ref 12.0–16.0)
LYMPHS ABS: 0.6 10*3/uL — AB (ref 1.0–3.6)
LYMPHS PCT: 5 %
MCH: 30.4 pg (ref 26.0–34.0)
MCHC: 33.1 g/dL (ref 32.0–36.0)
MCV: 91.9 fL (ref 80.0–100.0)
MONOS PCT: 8 %
Monocytes Absolute: 0.9 10*3/uL (ref 0.2–0.9)
NEUTROS ABS: 10.2 10*3/uL — AB (ref 1.4–6.5)
NEUTROS PCT: 86 %
Platelets: 227 10*3/uL (ref 150–440)
RBC: 4.17 MIL/uL (ref 3.80–5.20)
RDW: 16.9 % — ABNORMAL HIGH (ref 11.5–14.5)
WBC: 11.8 10*3/uL — ABNORMAL HIGH (ref 3.6–11.0)

## 2017-04-03 LAB — BASIC METABOLIC PANEL
Anion gap: 11 (ref 5–15)
BUN: 32 mg/dL — ABNORMAL HIGH (ref 6–20)
CHLORIDE: 108 mmol/L (ref 101–111)
CO2: 21 mmol/L — AB (ref 22–32)
CREATININE: 1.23 mg/dL — AB (ref 0.44–1.00)
Calcium: 9 mg/dL (ref 8.9–10.3)
GFR calc non Af Amer: 37 mL/min — ABNORMAL LOW (ref >60)
GFR, EST AFRICAN AMERICAN: 42 mL/min — AB (ref >60)
GLUCOSE: 209 mg/dL — AB (ref 65–99)
Potassium: 4 mmol/L (ref 3.5–5.1)
Sodium: 140 mmol/L (ref 135–145)

## 2017-04-03 LAB — TSH: TSH: 0.24 u[IU]/mL — AB (ref 0.350–4.500)

## 2017-04-03 MED ORDER — ONDANSETRON HCL 4 MG/2ML IJ SOLN
4.0000 mg | Freq: Four times a day (QID) | INTRAMUSCULAR | Status: DC | PRN
Start: 2017-04-03 — End: 2017-04-07
  Administered 2017-04-04 – 2017-04-05 (×2): 4 mg via INTRAVENOUS
  Filled 2017-04-03 (×2): qty 2

## 2017-04-03 MED ORDER — ALBUTEROL SULFATE (2.5 MG/3ML) 0.083% IN NEBU: 2.5000 mg | INHALATION_SOLUTION | Freq: Four times a day (QID) | RESPIRATORY_TRACT | Status: DC | PRN

## 2017-04-03 MED ORDER — HYDROCORTISONE 1 % EX CREA
1.0000 "application " | TOPICAL_CREAM | Freq: Two times a day (BID) | CUTANEOUS | Status: DC | PRN
  Filled 2017-04-03: qty 28

## 2017-04-03 MED ORDER — SODIUM CHLORIDE 0.9% FLUSH
3.0000 mL | Freq: Two times a day (BID) | INTRAVENOUS | Status: DC
  Administered 2017-04-04 – 2017-04-06 (×5): 3 mL via INTRAVENOUS

## 2017-04-03 MED ORDER — ACETAMINOPHEN 650 MG RE SUPP: 650.0000 mg | Freq: Four times a day (QID) | RECTAL | Status: DC | PRN

## 2017-04-03 MED ORDER — FENTANYL CITRATE (PF) 100 MCG/2ML IJ SOLN
50.0000 ug | Freq: Once | INTRAMUSCULAR | Status: AC
  Administered 2017-04-03: 50 ug via INTRAVENOUS
  Filled 2017-04-03: qty 2

## 2017-04-03 MED ORDER — ONDANSETRON HCL 4 MG PO TABS: 4.0000 mg | ORAL_TABLET | Freq: Four times a day (QID) | ORAL | Status: DC | PRN

## 2017-04-03 MED ORDER — CALCIUM CARBONATE-VITAMIN D 500-200 MG-UNIT PO TABS
1.0000 | ORAL_TABLET | Freq: Two times a day (BID) | ORAL | Status: DC
  Administered 2017-04-04 – 2017-04-09 (×8): 1 via ORAL
  Filled 2017-04-03 (×8): qty 1

## 2017-04-03 MED ORDER — LACTATED RINGERS IV SOLN
INTRAVENOUS | Status: DC
  Administered 2017-04-03: 23:00:00 via INTRAVENOUS

## 2017-04-03 MED ORDER — AMLODIPINE BESYLATE 5 MG PO TABS
5.0000 mg | ORAL_TABLET | Freq: Every day | ORAL | Status: DC
  Administered 2017-04-04 – 2017-04-09 (×3): 5 mg via ORAL
  Filled 2017-04-03 (×4): qty 1

## 2017-04-03 MED ORDER — GLIMEPIRIDE 2 MG PO TABS
2.0000 mg | ORAL_TABLET | Freq: Every day | ORAL | Status: DC
  Administered 2017-04-04 – 2017-04-09 (×4): 2 mg via ORAL
  Filled 2017-04-03 (×6): qty 1

## 2017-04-03 MED ORDER — POLYETHYLENE GLYCOL 3350 17 G PO PACK: 17.0000 g | PACK | Freq: Every day | ORAL | Status: DC | PRN

## 2017-04-03 MED ORDER — AMIODARONE HCL 200 MG PO TABS
100.0000 mg | ORAL_TABLET | Freq: Every day | ORAL | Status: DC
Start: 2017-04-04 — End: 2017-04-09
  Administered 2017-04-04 – 2017-04-09 (×4): 100 mg via ORAL
  Filled 2017-04-03 (×4): qty 1

## 2017-04-03 MED ORDER — ATORVASTATIN CALCIUM 20 MG PO TABS
20.0000 mg | ORAL_TABLET | Freq: Every day | ORAL | Status: DC
  Administered 2017-04-04 – 2017-04-08 (×4): 20 mg via ORAL
  Filled 2017-04-03 (×4): qty 1

## 2017-04-03 MED ORDER — HYDROCODONE-ACETAMINOPHEN 5-325 MG PO TABS
1.0000 | ORAL_TABLET | ORAL | Status: DC | PRN
  Administered 2017-04-03: 1 via ORAL
  Administered 2017-04-04 (×3): 2 via ORAL
  Administered 2017-04-04 – 2017-04-05 (×3): 1 via ORAL
  Filled 2017-04-03 (×3): qty 2
  Filled 2017-04-03 (×5): qty 1

## 2017-04-03 MED ORDER — CIPROFLOXACIN HCL 0.3 % OP SOLN
2.0000 [drp] | Freq: Two times a day (BID) | OPHTHALMIC | Status: AC
  Administered 2017-04-04 – 2017-04-08 (×7): 2 [drp] via OPHTHALMIC
  Filled 2017-04-03 (×2): qty 2.5

## 2017-04-03 MED ORDER — ACETAMINOPHEN 325 MG PO TABS: 650.0000 mg | ORAL_TABLET | Freq: Four times a day (QID) | ORAL | Status: DC | PRN

## 2017-04-03 MED ORDER — TRAVOPROST (BAK FREE) 0.004 % OP SOLN
1.0000 [drp] | Freq: Every day | OPHTHALMIC | Status: DC
  Administered 2017-04-05 – 2017-04-07 (×3): 1 [drp] via OPHTHALMIC
  Filled 2017-04-03 (×2): qty 2.5

## 2017-04-03 MED ORDER — DOCUSATE SODIUM 100 MG PO CAPS
100.0000 mg | ORAL_CAPSULE | Freq: Two times a day (BID) | ORAL | Status: DC
  Administered 2017-04-04 – 2017-04-06 (×5): 100 mg via ORAL
  Filled 2017-04-03 (×5): qty 1

## 2017-04-03 MED ORDER — MORPHINE SULFATE (PF) 2 MG/ML IV SOLN
2.0000 mg | INTRAVENOUS | Status: DC | PRN
  Administered 2017-04-03 – 2017-04-06 (×8): 2 mg via INTRAVENOUS
  Filled 2017-04-03 (×8): qty 1

## 2017-04-03 MED ORDER — SODIUM CHLORIDE 0.9 % IV SOLN: 250.0000 mL | INTRAVENOUS | Status: DC | PRN

## 2017-04-03 MED ORDER — SODIUM CHLORIDE 0.9% FLUSH: 3.0000 mL | Freq: Two times a day (BID) | INTRAVENOUS | Status: DC

## 2017-04-03 MED ORDER — ADULT MULTIVITAMIN W/MINERALS CH
1.0000 | ORAL_TABLET | Freq: Every day | ORAL | Status: DC
  Administered 2017-04-04 – 2017-04-09 (×4): 1 via ORAL
  Filled 2017-04-03 (×4): qty 1

## 2017-04-03 MED ORDER — SODIUM CHLORIDE 0.9% FLUSH: 3.0000 mL | INTRAVENOUS | Status: DC | PRN

## 2017-04-03 MED ORDER — SALINE NASAL SPRAY 0.65 % NA SOLN
2.0000 | NASAL | Status: DC | PRN
  Administered 2017-04-04 – 2017-04-05 (×2): 2 via NASAL
  Filled 2017-04-03: qty 30

## 2017-04-03 MED ORDER — BRIMONIDINE TARTRATE 0.2 % OP SOLN
1.0000 [drp] | Freq: Two times a day (BID) | OPHTHALMIC | Status: DC
  Administered 2017-04-04 – 2017-04-09 (×9): 1 [drp] via OPHTHALMIC
  Filled 2017-04-03 (×2): qty 5

## 2017-04-03 NOTE — ED Provider Notes (Signed)
[facility] Emergency Department Provider Note   ____________________________________________   I have reviewed the triage vital signs and the nursing notes.   HISTORY  Chief Complaint Fall Right hip pain  History limited by: Not Limited   HPI Sarah Dougherty is a 83 y.o. female who presents to the emergency department today because of concern for right hip pain after a fall.   LOCATION:right hip DURATION:today TIMING: started after fall, gradually worsened SEVERITY: severe CONTEXT: patient states she was in her closet trying to get the trash when she fell over. She fell onto her right side. Initially was able to walk a little but then it became more severe.  MODIFYING FACTORS: worse with movement ASSOCIATED SYMPTOMS: denies any upper extremity pain. Denies hitting her head. No chest pain. No shortness of breath.  Per medical record review patient has a history of recent ed evaluation for a fall.  Past Medical History:  Diagnosis Date  . A-fib ([place])   . Arthritis    Osteoarthritis  . Asthma   . Chronic kidney disease    UTI  . COPD (chronic obstructive pulmonary disease) ([place])   . Coronary artery disease   . Diabetes mellitus without complication ([place])   . GERD (gastroesophageal reflux disease)   . Glaucoma (increased eye pressure)   . HOH (hard of hearing)    Left Hearing Aid  . Hypertension   . Hyperthyroidism   . On home oxygen therapy    uses at night  . Presence of permanent cardiac pacemaker   . Sleep apnea    No C-PAP    Patient Active Problem List   Diagnosis Date Noted  . 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 COPD exacerbation ([place]) 04/07/2016  . Squamous cell carcinoma 03/19/2016  . Bradycardia 12/10/2014  . COPD (chronic obstructive pulmonary disease) ([place]) 12/09/2014    Past Surgical History:  Procedure Laterality Date  . ABDOMINAL HYSTERECTOMY    . EYE SURGERY Left    Cataract Extraction with  IOL  . EYE SURGERY Right    Artificial Eye  . INSERT / REPLACE / REMOVE PACEMAKER    . PACEMAKER INSERTION Left 12/12/2014   Procedure: INSERTION PACEMAKER;  Surgeon: Isaias Cowman, MD;  Location: [facility] ORS;  Service: Cardiovascular;  Laterality: Left;  . PAROTIDECTOMY Left 12/10/2016   Procedure: PAROTIDECTOMY;  Surgeon: Clyde Canterbury, MD;  Location: [facility] ORS;  Service: ENT;  Laterality: Left;  . RADICAL NECK DISSECTION Left 12/10/2016   Procedure: RADICAL NECK DISSECTION;  Surgeon: Clyde Canterbury, MD;  Location: [facility] ORS;  Service: ENT;  Laterality: Left;    Prior to Admission medications   Medication Sig Start Date End Date Taking? Authorizing Provider  acetaminophen (TYLENOL 8 HOUR) 650 MG CR tablet Take 650 mg by mouth every 8 (eight) hours as needed for pain.    [provider]  albuterol (PROAIR HFA) 108 (90 Base) MCG/ACT inhaler Inhale 1 puff into the lungs every 6 (six) hours as needed for wheezing or shortness of breath.    [provider]  amiodarone (PACERONE) 200 MG tablet Take 100 mg by mouth daily.    [provider]  amLODipine (NORVASC) 5 MG tablet Take 5 mg by mouth daily. 11/13/14   [provider]  atorvastatin (LIPITOR) 20 MG tablet Take 20 mg by mouth daily.    [provider]  brimonidine (ALPHAGAN) 0.2 % ophthalmic solution Place 1 drop into the left eye 2 (two) times daily.  [provider]  Cholecalciferol (VITAMIN D) 2000 units tablet Take 2,000 Units by mouth daily.    [provider]  clotrimazole (GYNE-LOTRIMIN) 1 % vaginal cream Place 1 application vaginally daily as needed. For yeast infection. 11/15/14   [provider]  glimepiride (AMARYL) 2 MG tablet Take 2 mg by mouth daily. 10/18/14   [provider]  hydrocortisone cream (NEOSPORIN ECZEMA ESSENT MAX ST) 1 % Apply 1 application topically 2 (two) times daily as needed for itching (for sores.).    [provider]   ipratropium-albuterol (DUONEB) 0.5-2.5 (3) MG/3ML SOLN Take 3 mLs by nebulization every 4 (four) hours as needed. Patient not taking: Reported on 12/01/2016 04/10/16   Hower, Aaron Mose, MD  sodium chloride (OCEAN) 0.65 % nasal spray Place 2 sprays into the nose daily as needed. 09/14/14   [provider]  travoprost, benzalkonium, (TRAVATAN) 0.004 % ophthalmic solution Place 1 drop into the left eye at bedtime.    [provider]    Allergies Amoxicillin; Cephalexin; Nitrofurantoin; Penicillins; Sulfa antibiotics; and Azithromycin  Family History  Problem Relation Age of Onset  . Diabetes Brother   . Diabetes Son     Social History Social History   Tobacco Use  . Smoking status: Never Smoker  . Smokeless tobacco: Never Used  Substance Use Topics  . Alcohol use: No  . Drug use: No    Review of Systems Constitutional: No fever/chills Eyes: No visual changes. ENT: No sore throat. Cardiovascular: Denies chest pain. Respiratory: Denies shortness of breath. Gastrointestinal: No abdominal pain.  No nausea, no vomiting.  No diarrhea.   Genitourinary: Negative for dysuria. Musculoskeletal: Positive for right hip pain. Skin: Negative for rash. Neurological: Negative for headaches, focal weakness or numbness.  ____________________________________________   PHYSICAL EXAM:  VITAL SIGNS: ED Triage Vitals  Enc Vitals Group     BP 04/03/17 1853 (!) 155/72     Pulse Rate 04/03/17 1853 65     Resp 04/03/17 1853 16     Temp 04/03/17 1853 98.5 F (36.9 C)     Temp Source 04/03/17 1853 Oral     SpO2 04/03/17 1853 95 %     Weight 04/03/17 1851 112 lb (50.8 kg)     Height --      Head Circumference --      Peak Flow --      Pain Score 04/03/17 1854 2     Pain Loc --      Pain Edu? --      Excl. in [place]? --      Constitutional: Alert and oriented. Well appearing and in no distress. Eyes: Right eye with changes of chronic disease. ENT   Head: Normocephalic  and atraumatic.   Nose: No congestion/rhinnorhea.   Mouth/Throat: Mucous membranes are moist.   Neck: No stridor. No midline tenderness. Hematological/Lymphatic/Immunilogical: No cervical lymphadenopathy. Cardiovascular: Normal rate, regular rhythm.  No murmurs, rubs, or gallops.  Respiratory: Normal respiratory effort without tachypnea nor retractions. Breath sounds are clear and equal bilaterally. No wheezes/rales/rhonchi. Gastrointestinal: Soft and non tender. No rebound. No guarding.  Genitourinary: Deferred Musculoskeletal: Right leg with shortening and external rotation. Tender to manipulation of the hip. DP 2+. No other extremity tenderness.  Neurologic:  Normal speech and language. No gross focal neurologic deficits are appreciated.  Skin:  Skin is warm, dry and intact. No rash noted. Psychiatric: Mood and affect are normal. Speech and behavior are normal. Patient exhibits appropriate insight and judgment.  ____________________________________________  LABS (pertinent positives/negatives)  CBC WBC 11.8, hgb 12.7, plt 227 BMP glu 209, cr 1.23 ____________________________________________   EKG  I, Nance Pear, attending physician, personally viewed and interpreted this EKG  EKG Time: 1901 Rate: 64 Rhythm: sinus rhythm Axis: left axis deviation Intervals: qtc 435 QRS: LAFB ST changes: no st elevation Impression: abnormal ekg   ____________________________________________    RADIOLOGY  Left hip  Femoral neck fracture  I, Quirino Kakos, personally viewed and evaluated these images (hip plain radiographs) as part of my medical decision making.  CXR No acute abnormality ____________________________________________   PROCEDURES  Procedures  ____________________________________________   INITIAL IMPRESSION / ASSESSMENT AND PLAN / ED COURSE  Pertinent labs & imaging results that were available during my care of the patient were reviewed  by me and considered in my medical decision making (see chart for details).  Patient presented to the emergency department today after a fall and right hip pain. Concern for fracture or dislocation. X-ray does show fracture. Discussed with Dr. Donivan Scull with orthopedic surgery who evaluated the patient. Will plan on admission to the hospitalist service. Discussed findings and plan with patient and family.   ____________________________________________   FINAL CLINICAL IMPRESSION(S) / ED DIAGNOSES  Final diagnoses:  Fall, initial encounter  Closed fracture of right hip, initial encounter [facility])     Note: This dictation was prepared with Dragon dictation. Any transcriptional errors that result from this process are unintentional     Nance Pear, MD 04/03/17 2025

## 2017-04-03 NOTE — H&P (Signed)
[facility] NAME: Sarah Dougherty    MR#:  [id number]  DATE OF BIRTH:  [birth year]  DATE OF ADMISSION:  04/03/2017  PRIMARY CARE PHYSICIAN: Perrin Maltese, MD   REQUESTING/REFERRING PHYSICIAN:   CHIEF COMPLAINT:   Chief Complaint  Patient presents with  . Fall    HISTORY OF PRESENT ILLNESS: Sarah Dougherty  is a 83 y.o. female with a known history per below status post mechanical fall, in the emergency room patient found to have right femoral neck fracture, orthopedic surgery planning tentative repair on Sunday given Eliquis for A. fib, patient complaining of right hip pain only, family at the bedside, patient admitted for acute right hip fracture status post mechanical fall.  PAST MEDICAL HISTORY:   Past Medical History:  Diagnosis Date  . A-fib ([place])   . Arthritis    Osteoarthritis  . Asthma   . Chronic kidney disease    UTI  . COPD (chronic obstructive pulmonary disease) ([place])   . Coronary artery disease   . Diabetes mellitus without complication ([place])   . GERD (gastroesophageal reflux disease)   . Glaucoma (increased eye pressure)   . HOH (hard of hearing)    Left Hearing Aid  . Hypertension   . Hyperthyroidism   . On home oxygen therapy    uses at night  . Presence of permanent cardiac pacemaker   . Sleep apnea    No C-PAP    PAST SURGICAL HISTORY:  Past Surgical History:  Procedure Laterality Date  . ABDOMINAL HYSTERECTOMY    . EYE SURGERY Left    Cataract Extraction with IOL  . EYE SURGERY Right    Artificial Eye  . INSERT / REPLACE / REMOVE PACEMAKER    . PACEMAKER INSERTION Left 12/12/2014   Procedure: INSERTION PACEMAKER;  Surgeon: Isaias Cowman, MD;  Location: [facility] ORS;  Service: Cardiovascular;  Laterality: Left;  . PAROTIDECTOMY Left 12/10/2016   Procedure: PAROTIDECTOMY;  Surgeon: Clyde Canterbury, MD;  Location: [facility] ORS;  Service: ENT;  Laterality: Left;  . RADICAL NECK DISSECTION Left 12/10/2016    Procedure: RADICAL NECK DISSECTION;  Surgeon: Clyde Canterbury, MD;  Location: [facility] ORS;  Service: ENT;  Laterality: Left;    SOCIAL HISTORY:  Social History   Tobacco Use  . Smoking status: Never Smoker  . Smokeless tobacco: Never Used  Substance Use Topics  . Alcohol use: No    FAMILY HISTORY:  Family History  Problem Relation Age of Onset  . Diabetes Brother   . Diabetes Son     DRUG ALLERGIES:  Allergies  Allergen Reactions  . Amoxicillin Other (See Comments)    unknown  . Cephalexin Other (See Comments)    unknown  . Nitrofurantoin Other (See Comments)     Unknown  . Penicillins Other (See Comments)    Other reaction(s): Unknown  . Sulfa Antibiotics Hives and Itching  . Azithromycin Rash    REVIEW OF SYSTEMS:   CONSTITUTIONAL: No fever, fatigue or weakness.  EYES: No blurred or double vision.  EARS, NOSE, AND THROAT: No tinnitus or ear pain.  RESPIRATORY: No cough, shortness of breath, wheezing or hemoptysis.  CARDIOVASCULAR: No chest pain, orthopnea, edema.  GASTROINTESTINAL: No nausea, vomiting, diarrhea or abdominal pain.  GENITOURINARY: No dysuria, hematuria.  ENDOCRINE: No polyuria, nocturia,  HEMATOLOGY: No anemia, easy bruising or bleeding SKIN: No rash or lesion. MUSCULOSKELETAL: Right hip pain    NEUROLOGIC: No tingling, numbness, weakness.  PSYCHIATRY: No  anxiety or depression.   MEDICATIONS AT HOME:  Prior to Admission medications   Medication Sig Start Date End Date Taking? Authorizing Provider  acetaminophen (TYLENOL 8 HOUR) 650 MG CR tablet Take 650 mg by mouth every 8 (eight) hours as needed for pain.   Yes [provider]  albuterol (PROAIR HFA) 108 (90 Base) MCG/ACT inhaler Inhale 1 puff into the lungs every 6 (six) hours as needed for wheezing or shortness of breath.   Yes [provider]  amiodarone (PACERONE) 200 MG tablet Take 100 mg by mouth daily.   Yes [provider]  amLODipine (NORVASC) 5 MG tablet  Take 5 mg by mouth daily. 11/13/14  Yes [provider]  apixaban (ELIQUIS) 5 MG TABS tablet Take 2.5 mg by mouth 2 (two) times daily.   Yes [provider]  atorvastatin (LIPITOR) 20 MG tablet Take 20 mg by mouth daily.   Yes [provider]  brimonidine (ALPHAGAN) 0.2 % ophthalmic solution Place 1 drop into the left eye 2 (two) times daily.   Yes [provider]  Cholecalciferol (VITAMIN D) 2000 units tablet Take 2,000 Units by mouth daily.   Yes [provider]  clotrimazole (GYNE-LOTRIMIN) 1 % vaginal cream Place 1 application vaginally daily as needed. For yeast infection. 11/15/14  Yes [provider]  glimepiride (AMARYL) 2 MG tablet Take 2 mg by mouth daily. 10/18/14  Yes [provider]  hydrocortisone cream (NEOSPORIN ECZEMA ESSENT MAX ST) 1 % Apply 1 application topically 2 (two) times daily as needed for itching (for sores.).   Yes [provider]  sodium chloride (OCEAN) 0.65 % nasal spray Place 2 sprays into the nose daily as needed. 09/14/14  Yes [provider]  travoprost, benzalkonium, (TRAVATAN) 0.004 % ophthalmic solution Place 1 drop into the left eye at bedtime.   Yes [provider]      PHYSICAL EXAMINATION:   VITAL SIGNS: Blood pressure (!) 125/59, pulse (!) 59, temperature 98.5 F (36.9 C), temperature source Oral, resp. rate (!) 7, weight 50.8 kg (112 lb), SpO2 90 %.  GENERAL:  83 y.o.-year-old patient lying in the bed with no acute distress.  Frail-appearing, nontoxic appearing EYES: Left pupil reactive, right artificial eye-noted purulent drainage, No scleral icterus.  HEENT: Facial ecchymosis noted - traumatic, normocephalic. Oropharynx and nasopharynx clear.  NECK:  Supple, no jugular venous distention. No thyroid enlargement, no tenderness.  LUNGS: Normal breath sounds bilaterally, no wheezing, rales,rhonchi or crepitation. No use of accessory muscles of respiration.   CARDIOVASCULAR: S1, S2 normal. No murmurs, rubs, or gallops.  ABDOMEN: Soft, nontender, nondistended. Bowel sounds present. No organomegaly or mass.  EXTREMITIES: No pedal edema, cyanosis, or clubbing.  NEUROLOGIC: Cranial nerves II through XII are intact. MAES -limited range of motion right lower extremity secondary to pain. Gait not checked.  PSYCHIATRIC: The patient is alert and oriented x 3.  SKIN: No obvious rash, lesion, or ulcer.   LABORATORY PANEL:   CBC Recent Labs  Lab 04/03/17 1952  WBC 11.8*  HGB 12.7  HCT 38.4  PLT 227  MCV 91.9  MCH 30.4  MCHC 33.1  RDW 16.9*  LYMPHSABS 0.6*  MONOABS 0.9  EOSABS 0.0  BASOSABS 0.1   ------------------------------------------------------------------------------------------------------------------  Chemistries  Recent Labs  Lab 04/03/17 1952  NA 140  K 4.0  CL 108  CO2 21*  GLUCOSE 209*  BUN 32*  CREATININE 1.23*  CALCIUM 9.0   ------------------------------------------------------------------------------------------------------------------ estimated creatinine clearance is 21.6 mL/min (A) (  by C-G formula based on SCr of 1.23 mg/dL (H)). ------------------------------------------------------------------------------------------------------------------ No results for input(s): TSH, T4TOTAL, T3FREE, THYROIDAB in the last 72 hours.  Invalid input(s): FREET3   Coagulation profile No results for input(s): INR, PROTIME in the last 168 hours. ------------------------------------------------------------------------------------------------------------------- No results for input(s): DDIMER in the last 72 hours. -------------------------------------------------------------------------------------------------------------------  Cardiac Enzymes No results for input(s): CKMB, TROPONINI, MYOGLOBIN in the last 168 hours.  Invalid input(s):  CK ------------------------------------------------------------------------------------------------------------------ Invalid input(s): POCBNP  ---------------------------------------------------------------------------------------------------------------  Urinalysis    Component Value Date/Time   COLORURINE YELLOW (A) 04/08/2016 1846   APPEARANCEUR CLEAR (A) 04/08/2016 1846   APPEARANCEUR Clear 10/22/2011 1612   LABSPEC 1.014 04/08/2016 1846   LABSPEC 1.019 10/22/2011 1612   PHURINE 5.0 04/08/2016 1846   GLUCOSEU >=500 (A) 04/08/2016 1846   GLUCOSEU Negative 10/22/2011 1612   HGBUR SMALL (A) 04/08/2016 1846   BILIRUBINUR NEGATIVE 04/08/2016 1846   BILIRUBINUR Negative 10/22/2011 1612   KETONESUR NEGATIVE 04/08/2016 1846   PROTEINUR NEGATIVE 04/08/2016 1846   NITRITE NEGATIVE 04/08/2016 1846   LEUKOCYTESUR NEGATIVE 04/08/2016 1846   LEUKOCYTESUR 1+ 10/22/2011 1612     RADIOLOGY: Dg Chest 1 View  Result Date: 04/03/2017 CLINICAL DATA:  Preop evaluation.  Fell. EXAM: CHEST 1 VIEW COMPARISON:  11/20/2016. FINDINGS: Grossly stable enlarged cardiac silhouette and left subclavian bipolar pacemaker leads. Clear lungs with stable mild prominence of the interstitial markings. No pleural fluid. Diffuse osteopenia. No fracture or pneumothorax. Cholecystectomy clips. IMPRESSION: No acute abnormality. Stable cardiomegaly and mild chronic interstitial lung disease. Electronically Signed   By: Claudie Revering M.D.   On: 04/03/2017 19:42   Dg Hip Unilat W Or Wo Pelvis 2-3 Views Right  Result Date: 04/03/2017 CLINICAL DATA:  Status post fall.  Right hip pain. EXAM: DG HIP (WITH OR WITHOUT PELVIS) 2-3V RIGHT COMPARISON:  None. FINDINGS: Severe osteopenia. Mildly displaced right subcapital femoral neck fracture. No dislocation. No other fracture or dislocation. IMPRESSION: 1. Mildly displaced right subcapital femoral neck fracture. Electronically Signed   By: Kathreen Devoid   On: 04/03/2017 19:38     EKG: Orders placed or performed during the hospital encounter of 04/07/16  . ED EKG  . ED EKG  . EKG 12-Lead  . EKG 12-Lead    IMPRESSION AND PLAN: 1 acute right femoral neck fracture status post mechanical fall Admit to regular nursing floor bed, hold Eliquis, and discussion with orthopedic surgery-plans tentatively for repair on Sunday, adult pain protocol, patient cleared for operative intervention, risk for acute cardiac death/cardiac arrest/stroke less than 3%  2 chronic A. Fib Stable Continue amiodarone, Eliquis on hold as stated above for operative intervention planned on Sunday  3 chronic diabetes mellitus type 2 Stable Continue home regiment, sliding scale insulin and Accu-Cheks per routine  4 COPD without exacerbation Stable Breathing treatments as needed  5 acute right eye infection Cipro eyedrops twice daily for 5-day course   Full code Condition stable Prognosis poor DVT prophylaxis-on Eliquis prior to admission/SCDs for now Disposition to inpatient rehab status post discharge in 4-5 days barring any complications   All the records are reviewed and case discussed with ED provider. Management plans discussed with the patient, family and they are in agreement.  CODE STATUS: Code Status History    Date Active Date Inactive Code Status Order ID Comments User Context   12/10/2016 13:27 12/12/2016 15:17 Full Code [id number]  Clyde Canterbury, MD Inpatient   04/07/2016 21:08 04/10/2016 17:06 Full Code [id number]  Vaughan Basta, MD Inpatient   12/12/2014  14:52 12/13/2014 18:28 Full Code [id number]  Isaias Cowman, MD Inpatient   12/09/2014 23:03 12/12/2014 14:52 Full Code [id number]  Baxter Hire, MD Inpatient       TOTAL TIME TAKING CARE OF THIS PATIENT: 40 minutes.    Avel Peace Nicolaus Andel M.D on 04/03/2017   Between 7am to 6pm - Pager - [phone]  After 6pm go to [email or web address] - password [email or web address] [facility]  Office   [phone]  CC: Primary care physician; Perrin Maltese, MD   Note: This dictation was prepared with Dragon dictation along with smaller phrase technology. Any transcriptional errors that result from this process are unintentional.

## 2017-04-03 NOTE — Consult Note (Signed)
Reason for Consult: Right femoral neck fracture  Referring Physician: [name] is an 83 y.o. female  HPI:  Patient is a 83 years old female with significant comorbidities. Patient lives with her son. She had a fall this morning. She was brought to the ER by EMS at [facility]. X-rays showed evidence of right hip femoral neck fracture with displacement. Patient has a history of recent admission few weeks ago with a similar fall when she was brought to the ER. Patient denies pain anywhere else in her body.   Past Medical History:  Diagnosis Date  . A-fib ([place])   . Arthritis    Osteoarthritis  . Asthma   . Chronic kidney disease    UTI  . COPD (chronic obstructive pulmonary disease) ([place])   . Coronary artery disease   . Diabetes mellitus without complication ([place])   . GERD (gastroesophageal reflux disease)   . Glaucoma (increased eye pressure)   . HOH (hard of hearing)    Left Hearing Aid  . Hypertension   . Hyperthyroidism   . On home oxygen therapy    uses at night  . Presence of permanent cardiac pacemaker   . Sleep apnea    No C-PAP    Past Surgical History:  Procedure Laterality Date  . ABDOMINAL HYSTERECTOMY    . EYE SURGERY Left    Cataract Extraction with IOL  . EYE SURGERY Right    Artificial Eye  . INSERT / REPLACE / REMOVE PACEMAKER    . PACEMAKER INSERTION Left 12/12/2014   Procedure: INSERTION PACEMAKER;  Surgeon: Isaias Cowman, MD;  Location: [facility] ORS;  Service: Cardiovascular;  Laterality: Left;  . PAROTIDECTOMY Left 12/10/2016   Procedure: PAROTIDECTOMY;  Surgeon: Clyde Canterbury, MD;  Location: [facility] ORS;  Service: ENT;  Laterality: Left;  . RADICAL NECK DISSECTION Left 12/10/2016   Procedure: RADICAL NECK DISSECTION;  Surgeon: Clyde Canterbury, MD;  Location: [facility] ORS;  Service: ENT;  Laterality: Left;    Family History  Problem Relation Age of Onset  . Diabetes Brother   . Diabetes Son     Social History:   reports that  has never smoked. she has never used smokeless tobacco. She reports that she does not drink alcohol or use drugs.  Allergies:  Allergies  Allergen Reactions  . Amoxicillin Other (See Comments)    unknown  . Cephalexin Other (See Comments)    unknown  . Nitrofurantoin Other (See Comments)     Unknown  . Penicillins Other (See Comments)    Other reaction(s): Unknown  . Sulfa Antibiotics Hives and Itching  . Azithromycin Rash    Medications: I have reviewed the patient's current medications.  Results for orders placed or performed during the hospital encounter of 04/03/17 (from the past 48 hour(s))  CBC with Differential     Status: Abnormal   Collection Time: 04/03/17  7:52 PM  Result Value Ref Range   WBC 11.8 (H) 3.6 - 11.0 K/uL   RBC 4.17 3.80 - 5.20 MIL/uL   Hemoglobin 12.7 12.0 - 16.0 g/dL   HCT 38.4 35.0 - 47.0 %   MCV 91.9 80.0 - 100.0 fL   MCH 30.4 26.0 - 34.0 pg   MCHC 33.1 32.0 - 36.0 g/dL   RDW 16.9 (H) 11.5 - 14.5 %   Platelets 227 150 - 440 K/uL   Neutrophils Relative % 86 %   Neutro Abs 10.2 (H) 1.4 - 6.5 K/uL  Lymphocytes Relative 5 %   Lymphs Abs 0.6 (L) 1.0 - 3.6 K/uL   Monocytes Relative 8 %   Monocytes Absolute 0.9 0.2 - 0.9 K/uL   Eosinophils Relative 0 %   Eosinophils Absolute 0.0 0 - 0.7 K/uL   Basophils Relative 1 %   Basophils Absolute 0.1 0 - 0.1 K/uL    Comment: Performed at [facility], [address], [place], [place] [address]    Dg Chest 1 View  Result Date: 04/03/2017 CLINICAL DATA:  Preop evaluation.  Fell. EXAM: CHEST 1 VIEW COMPARISON:  11/20/2016. FINDINGS: Grossly stable enlarged cardiac silhouette and left subclavian bipolar pacemaker leads. Clear lungs with stable mild prominence of the interstitial markings. No pleural fluid. Diffuse osteopenia. No fracture or pneumothorax. Cholecystectomy clips. IMPRESSION: No acute abnormality. Stable cardiomegaly and mild chronic interstitial lung disease. Electronically  Signed   By: Claudie Revering M.D.   On: 04/03/2017 19:42   Dg Hip Unilat W Or Wo Pelvis 2-3 Views Right  Result Date: 04/03/2017 CLINICAL DATA:  Status post fall.  Right hip pain. EXAM: DG HIP (WITH OR WITHOUT PELVIS) 2-3V RIGHT COMPARISON:  None. FINDINGS: Severe osteopenia. Mildly displaced right subcapital femoral neck fracture. No dislocation. No other fracture or dislocation. IMPRESSION: 1. Mildly displaced right subcapital femoral neck fracture. Electronically Signed   By: Kathreen Devoid   On: 04/03/2017 19:38    ROS Blood pressure (!) 155/72, pulse 65, temperature 98.5 F (36.9 C), temperature source Oral, resp. rate 16, weight 50.8 kg (112 lb), SpO2 95 %. Physical Exam  General : Patient is complaining of right hip pain. She is however awake alert and oriented  HEENT: Normocephalic atraumatic  Chest: Normal shape normal breathing  Resp: No abnormal breathing pattern. No labored breathing.  Abdomen: Soft nontender nondistended no abnormal disc are palpable   Extremities: Right lower extremity is shortened and externally rotated. Patient has pain with logroll.  Neuro: Patient maintains intact flexion and extension off her toes.  Assessment/Plan: 83 years old female previously ambulatory with a displaced right femoral neck fracture. I had a detailed discussion with the family and the patient. Patient is on Eliquis. Patient based on the nature of the fracture is recommended for right hip hemiarthroplasty. The surgical procedure was discussed in detail. The postoperative recovery process was also discussed. Risks and benefits were also discussed.  Patient has been tentatively scheduled for Sunday morning. All the questions were answered to their satisfaction.   Sarah Dougherty 04/03/2017, 8:15 PM

## 2017-04-03 NOTE — ED Triage Notes (Signed)
Pt to ED via EMS from home after a fall. Pt reports she had leaned over the trash can and leaned too far and fell over. Pt has significant bruising noted to face that EMS reports is from a previous fall last week after having tripped over shoes.   Pt denies having hit head. Pain in right side of body.

## 2017-04-04 ENCOUNTER — Encounter: Payer: Self-pay | Admitting: Anesthesiology

## 2017-04-04 LAB — BASIC METABOLIC PANEL
Anion gap: 7 (ref 5–15)
BUN: 27 mg/dL — ABNORMAL HIGH (ref 6–20)
CHLORIDE: 110 mmol/L (ref 101–111)
CO2: 24 mmol/L (ref 22–32)
Calcium: 8.9 mg/dL (ref 8.9–10.3)
Creatinine, Ser: 1.01 mg/dL — ABNORMAL HIGH (ref 0.44–1.00)
GFR calc non Af Amer: 46 mL/min — ABNORMAL LOW (ref >60)
GFR, EST AFRICAN AMERICAN: 54 mL/min — AB (ref >60)
Glucose, Bld: 146 mg/dL — ABNORMAL HIGH (ref 65–99)
Potassium: 3.9 mmol/L (ref 3.5–5.1)
SODIUM: 141 mmol/L (ref 135–145)

## 2017-04-04 LAB — GLUCOSE, CAPILLARY
GLUCOSE-CAPILLARY: 154 mg/dL — AB (ref 65–99)
GLUCOSE-CAPILLARY: 144 mg/dL — AB (ref 65–99)

## 2017-04-04 MED ORDER — INSULIN ASPART 100 UNIT/ML ~~LOC~~ SOLN
0.0000 [IU] | Freq: Every day | SUBCUTANEOUS | Status: DC
  Administered 2017-04-07: 3 [IU] via SUBCUTANEOUS
  Filled 2017-04-04: qty 1

## 2017-04-04 MED ORDER — INSULIN ASPART 100 UNIT/ML ~~LOC~~ SOLN
0.0000 [IU] | Freq: Three times a day (TID) | SUBCUTANEOUS | Status: DC
  Administered 2017-04-04 – 2017-04-05 (×3): 2 [IU] via SUBCUTANEOUS
  Administered 2017-04-06 – 2017-04-07 (×3): 1 [IU] via SUBCUTANEOUS
  Administered 2017-04-08: 3 [IU] via SUBCUTANEOUS
  Administered 2017-04-08 – 2017-04-09 (×3): 2 [IU] via SUBCUTANEOUS
  Filled 2017-04-04 (×10): qty 1

## 2017-04-04 NOTE — Plan of Care (Signed)
Pt achieving adequate pain control with PRN medications.

## 2017-04-04 NOTE — Progress Notes (Signed)
[facility] [place] NAME: Sarah Dougherty    MR#:  [id number]  DATE OF BIRTH:  1921/05/06  SUBJECTIVE:  CHIEF COMPLAINT:   Chief Complaint  Patient presents with  . Fall   Pain in right hip with movement  No SOB  REVIEW OF SYSTEMS:    Review of Systems  Constitutional: Positive for malaise/fatigue. Negative for chills and fever.  HENT: Negative for sore throat.   Eyes: Negative for blurred vision, double vision and pain.  Respiratory: Negative for cough, hemoptysis, shortness of breath and wheezing.   Cardiovascular: Negative for chest pain, palpitations, orthopnea and leg swelling.  Gastrointestinal: Negative for abdominal pain, constipation, diarrhea, heartburn, nausea and vomiting.  Genitourinary: Negative for dysuria and hematuria.  Musculoskeletal: Positive for falls and joint pain. Negative for back pain.  Skin: Negative for rash.  Neurological: Negative for sensory change, speech change, focal weakness and headaches.  Endo/Heme/Allergies: Does not bruise/bleed easily.  Psychiatric/Behavioral: Negative for depression. The patient is not nervous/anxious.     DRUG ALLERGIES:   Allergies  Allergen Reactions  . Amoxicillin Other (See Comments)    unknown  . Cephalexin Other (See Comments)    unknown  . Nitrofurantoin Other (See Comments)     Unknown  . Penicillins Other (See Comments)    Other reaction(s): Unknown  . Sulfa Antibiotics Hives and Itching  . Azithromycin Rash    VITALS:  Blood pressure (!) 133/57, pulse (!) 59, temperature 98.4 F (36.9 C), temperature source Oral, resp. rate 18, height 5\' 2"  (1.575 m), weight 48.8 kg (107 lb 8 oz), SpO2 93 %.  PHYSICAL EXAMINATION:   Physical Exam  GENERAL:  83 y.o.-year-old patient lying in the bed with no acute distress.  EYES:  No scleral icterus. Extraocular muscles intact.  HEENT: Head atraumatic, normocephalic. Oropharynx and nasopharynx clear.  NECK:  Supple,  no jugular venous distention. No thyroid enlargement, no tenderness.  LUNGS: Normal breath sounds bilaterally, no wheezing, rales, rhonchi. No use of accessory muscles of respiration.  CARDIOVASCULAR: S1, S2 normal. No murmurs, rubs, or gallops.  ABDOMEN: Soft, nontender, nondistended. Bowel sounds present. No organomegaly or mass.  EXTREMITIES: No cyanosis, clubbing or edema b/l.    NEUROLOGIC: Cranial nerves II through XII are intact. No focal Motor or sensory deficits b/l.   PSYCHIATRIC: The patient is alert and oriented x 3.  SKIN: No obvious rash, lesion, or ulcer.   LABORATORY PANEL:   CBC Recent Labs  Lab 04/03/17 1952  WBC 11.8*  HGB 12.7  HCT 38.4  PLT 227   ------------------------------------------------------------------------------------------------------------------ Chemistries  Recent Labs  Lab 04/04/17 0324  NA 141  K 3.9  CL 110  CO2 24  GLUCOSE 146*  BUN 27*  CREATININE 1.01*  CALCIUM 8.9   ------------------------------------------------------------------------------------------------------------------  Cardiac Enzymes No results for input(s): TROPONINI in the last 168 hours. ------------------------------------------------------------------------------------------------------------------  RADIOLOGY:  Dg Chest 1 View  Result Date: 04/03/2017 CLINICAL DATA:  Preop evaluation.  Fell. EXAM: CHEST 1 VIEW COMPARISON:  11/20/2016. FINDINGS: Grossly stable enlarged cardiac silhouette and left subclavian bipolar pacemaker leads. Clear lungs with stable mild prominence of the interstitial markings. No pleural fluid. Diffuse osteopenia. No fracture or pneumothorax. Cholecystectomy clips. IMPRESSION: No acute abnormality. Stable cardiomegaly and mild chronic interstitial lung disease. Electronically Signed   By: Claudie Revering M.D.   On: 04/03/2017 19:42   Dg Hip Unilat W Or Wo Pelvis 2-3 Views Right  Result Date: 04/03/2017 CLINICAL DATA:  Status post  fall.   Right hip pain. EXAM: DG HIP (WITH OR WITHOUT PELVIS) 2-3V RIGHT COMPARISON:  None. FINDINGS: Severe osteopenia. Mildly displaced right subcapital femoral neck fracture. No dislocation. No other fracture or dislocation. IMPRESSION: 1. Mildly displaced right subcapital femoral neck fracture. Electronically Signed   By: Kathreen Devoid   On: 04/03/2017 19:38     ASSESSMENT AND PLAN:   1 Acute right femoral neck fracture status post mechanical fall Surgery tomorrow. Off eliquis Pain meds PT after surgery DVT prophylaxis per surgery  2 Chronic A. Fib Stable Continue amiodarone, Eliquis on hold as stated above for operative intervention.  3 Diabetes mellitus type 2 SSI  4 COPD without exacerbation Breathing treatments as needed  5 Acute right eye infection Cipro eyedrops twice daily for 5-days    All the records are reviewed and case discussed with Care Management/Social Worker Management plans discussed with the patient, family and they are in agreement.  CODE STATUS: FULL CODE  DVT Prophylaxis: SCDs  TOTAL TIME TAKING CARE OF THIS PATIENT: 35 minutes.   POSSIBLE D/C IN 3-4 DAYS, DEPENDING ON CLINICAL CONDITION.  Leia Alf Loree Shehata M.D on 04/04/2017 at 12:56 PM  Between 7am to 6pm - Pager - [phone]  After 6pm go to [email or web address] - password [email or web address] [facility]  Office  [phone]  CC: Primary care physician; Perrin Maltese, MD  Note: This dictation was prepared with Dragon dictation along with smaller phrase technology. Any transcriptional errors that result from this process are unintentional.

## 2017-04-04 NOTE — Anesthesia Preprocedure Evaluation (Addendum)
Anesthesia Evaluation  Patient identified by MRN, date of birth, ID band Patient awake    Reviewed: Allergy & Precautions, NPO status , Patient's Chart, lab work & pertinent test results  History of Anesthesia Complications Negative for: history of anesthetic complications  Airway Mallampati: III       Dental  (+) Missing, Chipped   Pulmonary asthma , sleep apnea , COPD,  COPD inhaler,           Cardiovascular hypertension, Pt. on medications + CAD  + pacemaker      Neuro/Psych    GI/Hepatic Neg liver ROS, GERD  Medicated and Controlled,  Endo/Other  diabetes, Type 2, Oral Hypoglycemic AgentsHyperthyroidism   Renal/GU Renal InsufficiencyRenal disease     Musculoskeletal  (+) Arthritis , Osteoarthritis,    Abdominal   Peds  Hematology negative hematology ROS (+)   Anesthesia Other Findings Past Medical History: No date: A-fib ([place]) No date: Arthritis     Comment:  Osteoarthritis No date: Asthma No date: Chronic kidney disease     Comment:  UTI No date: COPD (chronic obstructive pulmonary disease) ([place]) No date: Coronary artery disease No date: Diabetes mellitus without complication (HCC) No date: GERD (gastroesophageal reflux disease) No date: Glaucoma (increased eye pressure) No date: HOH (hard of hearing)     Comment:  Left Hearing Aid No date: Hypertension No date: Hyperthyroidism No date: On home oxygen therapy     Comment:  uses at night No date: Presence of permanent cardiac pacemaker No date: Sleep apnea     Comment:  No C-PAP  Reproductive/Obstetrics                             Anesthesia Physical  Anesthesia Plan  ASA: III  Anesthesia Plan: General   Post-op Pain Management:    Induction:   PONV Risk Score and Plan: 3 and Ondansetron, Dexamethasone and Midazolam  Airway Management Planned: Oral ETT  Additional Equipment:   Intra-op Plan:    Post-operative Plan: Extubation in OR and Possible Post-op intubation/ventilation  Informed Consent: I have reviewed the patients History and Physical, chart, labs and discussed the procedure including the risks, benefits and alternatives for the proposed anesthesia with the patient or authorized representative who has indicated his/her understanding and acceptance.     Plan Discussed with:   Anesthesia Plan Comments:         Anesthesia Quick Evaluation

## 2017-04-05 ENCOUNTER — Encounter
Admission: EM | Disposition: A | Discharge status: Skilled Nursing Facility | Payer: Self-pay | Source: Home / Self Care | Attending: Internal Medicine

## 2017-04-05 ENCOUNTER — Encounter: Payer: Self-pay | Admitting: Certified Registered Nurse Anesthetist

## 2017-04-05 ENCOUNTER — Inpatient Hospital Stay: Payer: PPO

## 2017-04-05 ENCOUNTER — Inpatient Hospital Stay: Payer: PPO | Admitting: Anesthesiology

## 2017-04-05 DIAGNOSIS — Z538 Procedure and treatment not carried out for other reasons: Secondary | ICD-10-CM | POA: Diagnosis not present

## 2017-04-05 DIAGNOSIS — I2510 Atherosclerotic heart disease of native coronary artery without angina pectoris: Secondary | ICD-10-CM | POA: Diagnosis not present

## 2017-04-05 DIAGNOSIS — S72001A Fracture of unspecified part of neck of right femur, initial encounter for closed fracture: Secondary | ICD-10-CM | POA: Diagnosis not present

## 2017-04-05 DIAGNOSIS — J449 Chronic obstructive pulmonary disease, unspecified: Secondary | ICD-10-CM | POA: Diagnosis not present

## 2017-04-05 DIAGNOSIS — E1122 Type 2 diabetes mellitus with diabetic chronic kidney disease: Secondary | ICD-10-CM | POA: Diagnosis not present

## 2017-04-05 DIAGNOSIS — G4730 Sleep apnea, unspecified: Secondary | ICD-10-CM | POA: Diagnosis not present

## 2017-04-05 DIAGNOSIS — I129 Hypertensive chronic kidney disease with stage 1 through stage 4 chronic kidney disease, or unspecified chronic kidney disease: Secondary | ICD-10-CM | POA: Diagnosis not present

## 2017-04-05 DIAGNOSIS — N189 Chronic kidney disease, unspecified: Secondary | ICD-10-CM | POA: Diagnosis not present

## 2017-04-05 LAB — GLUCOSE, CAPILLARY
GLUCOSE-CAPILLARY: 198 mg/dL — AB (ref 65–99)
GLUCOSE-CAPILLARY: 173 mg/dL — AB (ref 65–99)
Glucose-Capillary: 165 mg/dL — ABNORMAL HIGH (ref 65–99)
Glucose-Capillary: 103 mg/dL — ABNORMAL HIGH (ref 65–99)

## 2017-04-05 LAB — SURGICAL PCR SCREEN
MRSA, PCR: NEGATIVE
Staphylococcus aureus: NEGATIVE

## 2017-04-05 SURGERY — CANCELLED PROCEDURE
Anesthesia: General | Laterality: Right

## 2017-04-05 MED ORDER — ONDANSETRON HCL 4 MG/2ML IJ SOLN: 4.0000 mg | Freq: Once | INTRAMUSCULAR | Status: DC | PRN

## 2017-04-05 MED ORDER — PROPOFOL 10 MG/ML IV BOLUS
INTRAVENOUS | Status: AC
  Filled 2017-04-05: qty 20

## 2017-04-05 MED ORDER — FENTANYL CITRATE (PF) 100 MCG/2ML IJ SOLN
INTRAMUSCULAR | Status: AC
  Filled 2017-04-05: qty 2

## 2017-04-05 MED ORDER — VANCOMYCIN HCL IN DEXTROSE 1-5 GM/200ML-% IV SOLN
INTRAVENOUS | Status: AC
  Filled 2017-04-05: qty 200

## 2017-04-05 MED ORDER — TRAZODONE HCL 50 MG PO TABS
50.0000 mg | ORAL_TABLET | Freq: Every evening | ORAL | Status: DC | PRN
  Administered 2017-04-05: 50 mg via ORAL
  Filled 2017-04-05: qty 1

## 2017-04-05 MED ORDER — FENTANYL CITRATE (PF) 100 MCG/2ML IJ SOLN: 25.0000 ug | INTRAMUSCULAR | Status: DC | PRN

## 2017-04-05 MED ORDER — LIDOCAINE HCL (PF) 2 % IJ SOLN
INTRAMUSCULAR | Status: AC
  Filled 2017-04-05: qty 10

## 2017-04-05 MED ORDER — IPRATROPIUM-ALBUTEROL 0.5-2.5 (3) MG/3ML IN SOLN
RESPIRATORY_TRACT | Status: AC
  Administered 2017-04-05: 3 mL
  Filled 2017-04-05: qty 3

## 2017-04-05 SURGICAL SUPPLY — 48 items
BLADE SAGITTAL WIDE XTHICK NO (BLADE) IMPLANT
BLADE SURG SZ10 CARB STEEL (BLADE) IMPLANT
BNDG COHESIVE 4X5 TAN STRL (GAUZE/BANDAGES/DRESSINGS) IMPLANT
CANISTER SUCT 1200ML W/VALVE (MISCELLANEOUS) IMPLANT
CANISTER SUCT 3000ML PPV (MISCELLANEOUS) IMPLANT
DRAPE IMP U-DRAPE 54X76 (DRAPES) IMPLANT
DRAPE INCISE IOBAN 66X60 STRL (DRAPES) IMPLANT
DRAPE SHEET LG 3/4 BI-LAMINATE (DRAPES) IMPLANT
DRAPE SURG 17X11 SM STRL (DRAPES) IMPLANT
DRAPE TABLE BACK 80X90 (DRAPES) IMPLANT
DRSG OPSITE POSTOP 4X10 (GAUZE/BANDAGES/DRESSINGS) IMPLANT
DURAPREP 26ML APPLICATOR (WOUND CARE) IMPLANT
ELECT BLADE 6.5 EXT (BLADE) IMPLANT
ELECT CAUTERY BLADE 6.4 (BLADE) IMPLANT
ELECT REM PT RETURN 9FT ADLT (ELECTROSURGICAL)
ELECTRODE REM PT RTRN 9FT ADLT (ELECTROSURGICAL) IMPLANT
GAUZE PETRO XEROFOAM 1X8 (MISCELLANEOUS) IMPLANT
GAUZE SPONGE 4X4 12PLY STRL (GAUZE/BANDAGES/DRESSINGS) IMPLANT
GLOVE BIOGEL PI IND STRL 9 (GLOVE) IMPLANT
GLOVE BIOGEL PI INDICATOR 9 (GLOVE)
GLOVE SURG 9.0 ORTHO LTXF (GLOVE) IMPLANT
GOWN STRL REUS TWL 2XL XL LVL4 (GOWN DISPOSABLE) IMPLANT
GOWN STRL REUS W/ TWL LRG LVL3 (GOWN DISPOSABLE) IMPLANT
GOWN STRL REUS W/TWL LRG LVL3 (GOWN DISPOSABLE)
HEMOVAC 400ML (MISCELLANEOUS)
KIT DRAIN HEMOVAC JP 7FR 400ML (MISCELLANEOUS) IMPLANT
KIT RM TURNOVER STRD PROC AR (KITS) IMPLANT
NDL SAFETY ECLIPSE 18X1.5 (NEEDLE) IMPLANT
NEEDLE FILTER BLUNT 18X 1/2SAF (NEEDLE)
NEEDLE FILTER BLUNT 18X1 1/2 (NEEDLE) IMPLANT
NEEDLE HYPO 18GX1.5 SHARP (NEEDLE)
NEEDLE MAYO CATGUT SZ4 (NEEDLE) IMPLANT
NS IRRIG 1000ML POUR BTL (IV SOLUTION) IMPLANT
PACK HIP PROSTHESIS (MISCELLANEOUS) IMPLANT
PILLOW ABDUC SM (MISCELLANEOUS) IMPLANT
PULSAVAC PLUS IRRIG FAN TIP (DISPOSABLE)
RETRIEVER SUT HEWSON (MISCELLANEOUS) IMPLANT
SOL .9 NS 3000ML IRR  AL (IV SOLUTION)
SOL .9 NS 3000ML IRR UROMATIC (IV SOLUTION) IMPLANT
STAPLER SKIN PROX 35W (STAPLE) IMPLANT
SUT TICRON 2-0 30IN 311381 (SUTURE) IMPLANT
SUT VIC AB 0 CT1 36 (SUTURE) IMPLANT
SUT VIC AB 2-0 CT2 27 (SUTURE) IMPLANT
SYR 10ML LL (SYRINGE) IMPLANT
TAPE MICROFOAM 4IN (TAPE) IMPLANT
TAPE TRANSPORE STRL 2 31045 (GAUZE/BANDAGES/DRESSINGS) IMPLANT
TIP BRUSH PULSAVAC PLUS 24.33 (MISCELLANEOUS) IMPLANT
TIP FAN IRRIG PULSAVAC PLUS (DISPOSABLE) IMPLANT

## 2017-04-05 NOTE — Progress Notes (Signed)
ORTHOPAEDICS PROGRESS NOTE  PATIENT NAME: Sarah Dougherty DOB: [birth year]  MRN: [id number]  POD # :   Subjective: Right femoral neck fracture  Objective: Vital signs in last 24 hours: Temp:  [98.4 F (36.9 C)-99.8 F (37.7 C)] 99.8 F (37.7 C) (12/23 0825) Pulse Rate:  [59-65] 65 (12/23 0825) Resp:  [16-18] 16 (12/23 0825) BP: (113-140)/(57-72) 140/62 (12/23 0825) SpO2:  [92 %-99 %] 99 % (12/23 0825)  Intake/Output from previous day: 12/22 0701 - 12/23 0700 In: 240 [P.O.:240] Out: -   Recent Labs    04/03/17 1952 04/04/17 0324  WBC 11.8*  --   HGB 12.7  --   HCT 38.4  --   PLT 227  --   K 4.0 3.9  CL 108 110  CO2 21* 24  BUN 32* 27*  CREATININE 1.23* 1.01*  GLUCOSE 209* 146*  CALCIUM 9.0 8.9    EXAM General: Awake, alert Lungs: normal Cardiac: normal rate, regular rhythm, normal S1, S2, no murmurs, rubs, clicks or gallops  extremity: Right LE externally rotated Neurologic: moving toes up and down  Assessment: 83 year old female with Right femoral neck fracture displaced.  Secondary diagnoses:   Plan: Patient was brought to the OR today 04/05/2017. O2 sats in the OR on room air were in 70s. Anesthesia decided to cancel the case. Patient will be started on pulmonary toliet. She will be rescheduled for surgery under spinal anesthesia on Tuesday or Wednesday.  Creig Hines, M.D.

## 2017-04-05 NOTE — Progress Notes (Signed)
Report given to [facility] on the floor, patient going For chest xray and then to her room.

## 2017-04-05 NOTE — Progress Notes (Signed)
[facility] [place] NAME: Sarah Dougherty    MR#:  [id number]  DATE OF BIRTH:  Aug 16, 1921  SUBJECTIVE:  CHIEF COMPLAINT:   Chief Complaint  Patient presents with  . Fall   Pain in right hip with movement  No SOB/CP  Surgery cancelled due to hypoxia  REVIEW OF SYSTEMS:    Review of Systems  Constitutional: Positive for malaise/fatigue. Negative for chills and fever.  HENT: Negative for sore throat.   Eyes: Negative for blurred vision, double vision and pain.  Respiratory: Negative for cough, hemoptysis, shortness of breath and wheezing.   Cardiovascular: Negative for chest pain, palpitations, orthopnea and leg swelling.  Gastrointestinal: Negative for abdominal pain, constipation, diarrhea, heartburn, nausea and vomiting.  Genitourinary: Negative for dysuria and hematuria.  Musculoskeletal: Positive for falls and joint pain. Negative for back pain.  Skin: Negative for rash.  Neurological: Negative for sensory change, speech change, focal weakness and headaches.  Endo/Heme/Allergies: Does not bruise/bleed easily.  Psychiatric/Behavioral: Negative for depression. The patient is not nervous/anxious.     DRUG ALLERGIES:   Allergies  Allergen Reactions  . Amoxicillin Other (See Comments)    unknown  . Cephalexin Other (See Comments)    unknown  . Nitrofurantoin Other (See Comments)     Unknown  . Penicillins Other (See Comments)    Other reaction(s): Unknown  . Sulfa Antibiotics Hives and Itching  . Azithromycin Rash    VITALS:  Blood pressure (!) 113/46, pulse (!) 59, temperature 99.8 F (37.7 C), resp. rate 18, height 5\' 2"  (1.575 m), weight 48.8 kg (107 lb 8 oz), SpO2 91 %.  PHYSICAL EXAMINATION:   Physical Exam  GENERAL:  83 y.o.-year-old patient lying in the bed with no acute distress.  EYES:  No scleral icterus. Extraocular muscles intact.  HEENT: Head atraumatic, normocephalic. Oropharynx and nasopharynx clear.   NECK:  Supple, no jugular venous distention. No thyroid enlargement, no tenderness.  LUNGS: Normal breath sounds bilaterally, no wheezing, rales, rhonchi. No use of accessory muscles of respiration.  CARDIOVASCULAR: S1, S2 normal. No murmurs, rubs, or gallops.  ABDOMEN: Soft, nontender, nondistended. Bowel sounds present. No organomegaly or mass.  EXTREMITIES: No cyanosis, clubbing or edema b/l.    NEUROLOGIC: Cranial nerves II through XII are intact. No focal Motor or sensory deficits b/l.   PSYCHIATRIC: The patient is alert and awake SKIN: No obvious rash, lesion, or ulcer.   LABORATORY PANEL:   CBC Recent Labs  Lab 04/03/17 1952  WBC 11.8*  HGB 12.7  HCT 38.4  PLT 227   ------------------------------------------------------------------------------------------------------------------ Chemistries  Recent Labs  Lab 04/04/17 0324  NA 141  K 3.9  CL 110  CO2 24  GLUCOSE 146*  BUN 27*  CREATININE 1.01*  CALCIUM 8.9   ------------------------------------------------------------------------------------------------------------------  Cardiac Enzymes No results for input(s): TROPONINI in the last 168 hours. ------------------------------------------------------------------------------------------------------------------  RADIOLOGY:  Dg Chest 1 View  Result Date: 04/03/2017 CLINICAL DATA:  Preop evaluation.  Fell. EXAM: CHEST 1 VIEW COMPARISON:  11/20/2016. FINDINGS: Grossly stable enlarged cardiac silhouette and left subclavian bipolar pacemaker leads. Clear lungs with stable mild prominence of the interstitial markings. No pleural fluid. Diffuse osteopenia. No fracture or pneumothorax. Cholecystectomy clips. IMPRESSION: No acute abnormality. Stable cardiomegaly and mild chronic interstitial lung disease. Electronically Signed   By: Claudie Revering M.D.   On: 04/03/2017 19:42   Dg Chest 2 View  Result Date: 04/05/2017 CLINICAL DATA:  83 year old female with hypoxia EXAM:  CHEST  2 VIEW COMPARISON:  Prior chest x-ray 04/03/2017 FINDINGS: Left subclavian approach cardiac rhythm maintenance device with leads in unchanged position overlying the right atrium and right ventricle. Stable cardiomegaly and mediastinal contours. Unchanged appearance of pulmonary parenchyma with chronic bronchitic changes and interstitial prominence. No evidence of pulmonary edema, focal airspace consolidation, pleural effusion or pneumothorax. Chronic lingular scarring appear similar compared to prior. IMPRESSION: Stable chest x-ray without evidence of acute cardiopulmonary process. Electronically Signed   By: Jacqulynn Cadet M.D.   On: 04/05/2017 09:36   Dg Hip Unilat W Or Wo Pelvis 2-3 Views Right  Result Date: 04/03/2017 CLINICAL DATA:  Status post fall.  Right hip pain. EXAM: DG HIP (WITH OR WITHOUT PELVIS) 2-3V RIGHT COMPARISON:  None. FINDINGS: Severe osteopenia. Mildly displaced right subcapital femoral neck fracture. No dislocation. No other fracture or dislocation. IMPRESSION: 1. Mildly displaced right subcapital femoral neck fracture. Electronically Signed   By: Kathreen Devoid   On: 04/03/2017 19:38     ASSESSMENT AND PLAN:   # Hypoxia Likely has chronic hypoxia. Has been on oxygen in the past. CXR shows COPD Add incentive spirometer. Continue oxygen  # Acute right femoral neck fracture status post mechanical fall Surgery cancelled due to hypoxia Off eliquis Pain meds PT after surgery DVT prophylaxis per surgery  # Chronic A. Fib Stable Continue amiodarone,Eliquis on hold as stated above for operative intervention.  # Diabetes mellitus type 2 SSI  # COPD without exacerbation Breathing treatments as needed  # Acute right eye infection Cipro eyedrops  All the records are reviewed and case discussed with Care Management/Social Worker Management plans discussed with the patient, family and they are in agreement.  CODE STATUS: FULL CODE  DVT Prophylaxis:  SCDs  TOTAL TIME TAKING CARE OF THIS PATIENT: 35 minutes.   POSSIBLE D/C IN 3-4 DAYS, DEPENDING ON CLINICAL CONDITION.  Leia Alf Ethelyne Erich M.D on 04/05/2017 at 1:50 PM  Between 7am to 6pm - Pager - [phone]  After 6pm go to [email or web address] - password [email or web address] [facility]  Office  [phone]  CC: Primary care physician; Perrin Maltese, MD  Note: This dictation was prepared with Dragon dictation along with smaller phrase technology. Any transcriptional errors that result from this process are unintentional.

## 2017-04-05 NOTE — Progress Notes (Signed)
Received call from pre-op. Surgery cancelled d/t low oxygen sats of 76%. Patient not previously on O2 and sats have been running 92-93% on room air. Spoke to Dr Donivan Scull who will call Dr. Darvin Neighbours to advise him of the situation. Surgery rescheduled for Tuesday 12/25 at 3pm per Dr. Donivan Scull.

## 2017-04-05 NOTE — Progress Notes (Signed)
Patient in pacu from OR, placed on cardiac monitor, No acute distress.  Will continue to monitor.

## 2017-04-05 NOTE — Progress Notes (Signed)
Patient noted with periods of confusion, called out to nurses station requesting overhead bed light to be turned off. RN turned off overhead call light at change and educated patient on how to turn out the light. Patient called out to the nurses station 15 minutes later requesting the overhead light to be turned on. Patient son at bedside and stated, "she is more concerned about the lights being out at home. I don't know what has gotten in to her. She is driving me nuts. I told her I'm going to leave if she don't quit". Patient's light was adjusted to patient's satisfaction. Patient then called out, shouting and screaming, about the "oxygen tubing". Patient removed the nasal cannula and had tubing in patient's hand. Patient stated, "What is this? I don't want this on me". Patient was educated about the importance of the oxygen. Patient did not appear to comprehend the importance of the oxygen and required further education. RN negotiated to with patient to wait until vital signs were collected to determine if the oxygen would still be needed. 10 minutes later, patient's son [name] stood in the patient doorway requesting to see the nursing supervisor. Charge nurse called to patient's room.

## 2017-04-05 NOTE — Anesthesia Post-op Follow-up Note (Signed)
Anesthesia QCDR form completed.        

## 2017-04-05 NOTE — Progress Notes (Signed)
Patient brought to the OR for Hemiarthroplasty under GOT since she was on eliquis since Friday ( 12/ 21  Am ).  The O2 sats were consistently in the 70s on room air and without any sedatives.  The patient's breathing was shallow with some retractions.  The case was discussed with the surgeon and it was felt that it would be prudent to postpone the surgery so that the case could more safely be done under a regional block and to optimize the patient's pulmonary toilet.

## 2017-04-05 NOTE — Transfer of Care (Signed)
Immediate Anesthesia Transfer of Care Note  Patient: Sarah Dougherty  Procedure(s) Performed: ARTHROPLASTY BIPOLAR HIP (HEMIARTHROPLASTY) (Right )  Patient Location: PACU  Anesthesia Type:General  Level of Consciousness: patient cooperative  Airway & Oxygen Therapy: Patient Spontanous Breathing and Patient connected to face mask oxygen  Post-op Assessment: Report given to RN and Post -op Vital signs reviewed and stable  Post vital signs: Reviewed and stable  Last Vitals:  Vitals:   04/04/17 2038 04/05/17 0825  BP: (!) 113/57 140/62  Pulse: (!) 59 65  Resp: 18 16  Temp: 37.2 C 37.7 C  SpO2: 92% 99%    Last Pain:  Vitals:   04/05/17 0342  TempSrc:   PainSc: Asleep         Complications: No apparent anesthesia complications Case never started Pt remained on hospital bed and hooked up monitors. No sedation or meds given.

## 2017-04-06 LAB — CBC WITH DIFFERENTIAL/PLATELET
BASOS ABS: 0 10*3/uL (ref 0–0.1)
Basophils Relative: 0 %
Eosinophils Absolute: 0.1 10*3/uL (ref 0–0.7)
Eosinophils Relative: 1 %
HEMATOCRIT: 37 % (ref 35.0–47.0)
HEMOGLOBIN: 12.1 g/dL (ref 12.0–16.0)
LYMPHS PCT: 6 %
Lymphs Abs: 0.6 10*3/uL — ABNORMAL LOW (ref 1.0–3.6)
MCH: 30.6 pg (ref 26.0–34.0)
MCHC: 32.7 g/dL (ref 32.0–36.0)
MCV: 93.6 fL (ref 80.0–100.0)
MONO ABS: 0.9 10*3/uL (ref 0.2–0.9)
Monocytes Relative: 10 %
NEUTROS ABS: 7.3 10*3/uL — AB (ref 1.4–6.5)
NEUTROS PCT: 83 %
Platelets: 225 10*3/uL (ref 150–440)
RBC: 3.95 MIL/uL (ref 3.80–5.20)
RDW: 16.8 % — AB (ref 11.5–14.5)
WBC: 8.9 10*3/uL (ref 3.6–11.0)

## 2017-04-06 LAB — GLUCOSE, CAPILLARY
GLUCOSE-CAPILLARY: 133 mg/dL — AB (ref 65–99)
GLUCOSE-CAPILLARY: 136 mg/dL — AB (ref 65–99)
GLUCOSE-CAPILLARY: 113 mg/dL — AB (ref 65–99)
Glucose-Capillary: 106 mg/dL — ABNORMAL HIGH (ref 65–99)

## 2017-04-06 LAB — PROTIME-INR
INR: 1.14
Prothrombin Time: 14.5 seconds (ref 11.4–15.2)

## 2017-04-06 LAB — TYPE AND SCREEN
ABO/RH(D): O POS
Antibody Screen: NEGATIVE

## 2017-04-06 LAB — APTT: APTT: 37 s — AB (ref 24–36)

## 2017-04-06 MED ORDER — HYDROCODONE-ACETAMINOPHEN 5-325 MG PO TABS
1.0000 | ORAL_TABLET | ORAL | Status: DC | PRN
  Administered 2017-04-06 – 2017-04-07 (×2): 1 via ORAL
  Filled 2017-04-06 (×2): qty 1

## 2017-04-06 MED ORDER — CLINDAMYCIN PHOSPHATE 600 MG/50ML IV SOLN
600.0000 mg | INTRAVENOUS | Status: AC
  Administered 2017-04-07: 600 mg via INTRAVENOUS
  Filled 2017-04-06: qty 50

## 2017-04-06 MED ORDER — SODIUM CHLORIDE 0.9 % IV SOLN
250.0000 mL | INTRAVENOUS | Status: DC
  Administered 2017-04-07: 16:00:00 via INTRAVENOUS
  Administered 2017-04-07: 250 mL via INTRAVENOUS

## 2017-04-06 NOTE — Anesthesia Postprocedure Evaluation (Signed)
Anesthesia Post Note  Patient: Sarah Dougherty  Procedure(s) Performed: CANCELLED PROCEDURE (Right )  Patient location during evaluation: PACU Level of consciousness: awake and alert and oriented Respiratory status: patient connected to face mask oxygen Cardiovascular status: blood pressure returned to baseline Anesthetic complications: no Comments: Case was aborted prior to start secondary to room air sats in the 70s... Will optimize pulmonary toilet and wait out eliquis and hopefully do the case on Tuesday under regional.     Last Vitals:  Vitals:   04/05/17 1631 04/05/17 2119  BP: (!) 116/51 (!) 134/57  Pulse: 60 (!) 57  Resp: 16   Temp: 36.8 C 36.8 C  SpO2: 95% 90%    Last Pain:  Vitals:   04/05/17 2124  TempSrc:   PainSc: 2                  Fernando Torry

## 2017-04-06 NOTE — Care Management Note (Signed)
Case Management Note  Patient Details  Name: Sarah Dougherty MRN: [id number] Date of Birth: 01-May-1921  Subjective/Objective:                 Patient admitted from home with hip fracture.  Surgical repair could not be scheduled immediately as patient had to have Eliquis held. When taken to the OR on 12/23- found to have room air sats in the 70's and the case was cancelled. To be rescheduled for 12/25 or 12/26.   Action/Plan:   Expected Discharge Date:                  Expected Discharge Plan:     In-House Referral:     Discharge planning Services     Post Acute Care Choice:    Choice offered to:     DME Arranged:    DME Agency:     HH Arranged:    HH Agency:     Status of Service:     If discussed at [employer] of Stay Meetings, dates discussed:    Additional Comments:  Katrina Stack, RN 04/06/2017, 8:59 AM

## 2017-04-06 NOTE — NC FL2 (Signed)
[place] LEVEL OF CARE SCREENING TOOL     IDENTIFICATION  Patient Name: Sarah Dougherty Birthdate: Aug 17, 1921 Sex: female Admission Date (Current Location): 04/03/2017  [place] and [place] Number:  Engineering geologist and Address:  [facility], [address] [place], [place] [address]      Provider Number: [id number]  Attending Physician Name and Address:  Hillary Bow, MD  Relative Name and Phone Number:       Current Level of Care: Hospital Recommended Level of Care: [place] Prior Approval Number:    Date Approved/Denied:   PASRR Number: ([id number] )  Discharge Plan: SNF    Current Diagnoses: Patient Active Problem List   Diagnosis Date Noted  . Hip fx ([place]) 04/03/2017  . 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 COPD exacerbation ([place]) 04/07/2016  . Squamous cell carcinoma 03/19/2016  . Bradycardia 12/10/2014  . COPD (chronic obstructive pulmonary disease) ([place]) 12/09/2014    Orientation RESPIRATION BLADDER Height & Weight     Self, Time, Place  O2(2 Liters Oxygen. ) Incontinent Weight: 107 lb 8 oz (48.8 kg) Height:  5\' 2"  (157.5 cm)  BEHAVIORAL SYMPTOMS/MOOD NEUROLOGICAL BOWEL NUTRITION STATUS      Continent Diet(Diet: Heart Healthy/ Carb Modified. )  AMBULATORY STATUS COMMUNICATION OF NEEDS Skin   Extensive Assist Verbally Surgical wounds                       Personal Care Assistance Level of Assistance  Bathing, Feeding, Dressing Bathing Assistance: Limited assistance Feeding assistance: Independent Dressing Assistance: Limited assistance     Functional Limitations Info  Sight, Hearing, Speech Sight Info: Adequate Hearing Info: Impaired Speech Info: Adequate    SPECIAL CARE FACTORS FREQUENCY  PT (By licensed PT), OT (By licensed OT)     PT Frequency: (5) OT Frequency: (5)            Contractures      Additional Factors Info  Code Status, Allergies  Code Status Info: (Full Code. ) Allergies Info: (Amoxicillin, Cephalexin, Nitrofurantoin, Penicillins, Sulfa Antibiotics, Azithromycin)           Current Medications (04/06/2017):  This is the current hospital active medication list Current Facility-Administered Medications  Medication Dose Route Frequency Provider Last Rate Last Dose  . [START ON 04/07/2017] 0.9 %  sodium chloride infusion  250 mL Intravenous Continuous Hooten, Laurice Record, MD      . acetaminophen (TYLENOL) tablet 650 mg  650 mg Oral Q6H PRN Salary, Montell D, MD       Or  . acetaminophen (TYLENOL) suppository 650 mg  650 mg Rectal Q6H PRN Salary, Montell D, MD      . albuterol (PROVENTIL) (2.5 MG/3ML) 0.083% nebulizer solution 2.5 mg  2.5 mg Inhalation Q6H PRN Salary, Montell D, MD      . amiodarone (PACERONE) tablet 100 mg  100 mg Oral Daily Salary, Montell D, MD   100 mg at 04/06/17 1010  . amLODipine (NORVASC) tablet 5 mg  5 mg Oral Daily Salary, Montell D, MD   5 mg at 04/06/17 1010  . atorvastatin (LIPITOR) tablet 20 mg  20 mg Oral Daily Salary, Montell D, MD   20 mg at 04/05/17 2100  . brimonidine (ALPHAGAN) 0.2 % ophthalmic solution 1 drop  1 drop Left Eye BID Salary, Montell D, MD   1 drop at 04/06/17 1023  . calcium-vitamin D (OSCAL WITH D) 500-200 MG-UNIT per tablet  1 tablet  1 tablet Oral BID Loney Hering D, MD   1 tablet at 04/06/17 1011  . ciprofloxacin (CILOXAN) 0.3 % ophthalmic solution 2 drop  2 drop Right Eye BID Salary, Holly Bodily D, MD   2 drop at 04/06/17 1021  . docusate sodium (COLACE) capsule 100 mg  100 mg Oral BID Salary, Montell D, MD   100 mg at 04/06/17 1010  . glimepiride (AMARYL) tablet 2 mg  2 mg Oral Daily Salary, Montell D, MD   2 mg at 04/06/17 1026  . HYDROcodone-acetaminophen (NORCO/VICODIN) 5-325 MG per tablet 1 tablet  1 tablet Oral Q4H PRN Hillary Bow, MD   1 tablet at 04/06/17 1233  . hydrocortisone cream 1 % 1 application  1 application Topical BID PRN Salary, Montell D, MD      .  insulin aspart (novoLOG) injection 0-5 Units  0-5 Units Subcutaneous QHS Sudini, Srikar, MD      . insulin aspart (novoLOG) injection 0-9 Units  0-9 Units Subcutaneous TID WC Hillary Bow, MD   1 Units at 04/06/17 1239  . morphine 2 MG/ML injection 2 mg  2 mg Intravenous Q2H PRN Salary, Holly Bodily D, MD   2 mg at 04/06/17 0934  . multivitamin with minerals tablet 1 tablet  1 tablet Oral Daily Salary, Montell D, MD   1 tablet at 04/06/17 1010  . ondansetron (ZOFRAN) tablet 4 mg  4 mg Oral Q6H PRN Salary, Montell D, MD       Or  . ondansetron (ZOFRAN) injection 4 mg  4 mg Intravenous Q6H PRN Salary, Montell D, MD   4 mg at 04/05/17 2056  . polyethylene glycol (MIRALAX / GLYCOLAX) packet 17 g  17 g Oral Daily PRN Salary, Montell D, MD      . sodium chloride (OCEAN) 0.65 % nasal spray 2 spray  2 spray Nasal PRN Salary, Avel Peace, MD   2 spray at 04/05/17 0552  . sodium chloride flush (NS) 0.9 % injection 3 mL  3 mL Intravenous Q12H Salary, Montell D, MD   3 mL at 04/06/17 1026  . sodium chloride flush (NS) 0.9 % injection 3 mL  3 mL Intravenous PRN Salary, Montell D, MD      . Travoprost (BAK Free) (TRAVATAN) 0.004 % ophthalmic solution SOLN 1 drop  1 drop Left Eye QHS Salary, Montell D, MD   1 drop at 04/05/17 2105  . traZODone (DESYREL) tablet 50 mg  50 mg Oral QHS PRN Salary, Avel Peace, MD   50 mg at 04/05/17 2133     Discharge Medications: Please see discharge summary for a list of discharge medications.  Relevant Imaging Results:  Relevant Lab Results:   Additional Information (SSN: [id number])  Archimedes Harold, Veronia Beets, LCSW

## 2017-04-06 NOTE — Clinical Social Work Placement (Signed)
   CLINICAL SOCIAL WORK PLACEMENT  NOTE  Date:  04/06/2017  Patient Details  Name: Sarah Dougherty MRN: [id number] Date of Birth: 05/02/1921  Clinical Social Work is seeking post-discharge placement for this patient at the [place] level of care (*CSW will initial, date and re-position this form in  chart as items are completed):  Yes   Patient/family provided with [place] Work Department's list of facilities offering this level of care within the geographic area requested by the patient (or if unable, by the patient's family).  Yes   Patient/family informed of their freedom to choose among providers that offer the needed level of care, that participate in Medicare, Medicaid or managed care program needed by the patient, have an available bed and are willing to accept the patient.  Yes   Patient/family informed of [facility]'s ownership interest in [facility] and [facility], as well as of the fact that they are under no obligation to receive care at these facilities.  PASRR submitted to EDS on       PASRR number received on       Existing PASRR number confirmed on 04/06/17     FL2 transmitted to all facilities in geographic area requested by pt/family on 04/06/17     FL2 transmitted to all facilities within larger geographic area on       Patient informed that his/her managed care company has contracts with or will negotiate with certain facilities, including the following:            Patient/family informed of bed offers received.  Patient chooses bed at       Physician recommends and patient chooses bed at      Patient to be transferred to   on  .  Patient to be transferred to facility by       Patient family notified on   of transfer.  Name of family member notified:        PHYSICIAN       Additional Comment:    _______________________________________________ Mariena Meares, Veronia Beets, LCSW 04/06/2017, 2:35 PM

## 2017-04-06 NOTE — Care Management Important Message (Signed)
Important Message  Patient Details  Name: Sarah Dougherty MRN: [id number] Date of Birth: 08-31-1921   Medicare Important Message Given:  Yes    Shelbie Ammons, RN 04/06/2017, 7:57 AM

## 2017-04-06 NOTE — Progress Notes (Signed)
ORTHOPAEDICS PROGRESS NOTE  PATIENT NAME: Sarah Dougherty DOB: 12/13/1921  MRN: [id number]  Subjective: Patient denies any significant pain. Son at beside.  Objective: Vital signs in last 24 hours: Temp:  [98.2 F (36.8 C)-99.5 F (37.5 C)] 99.5 F (37.5 C) (12/24 0820) Pulse Rate:  [57-60] 59 (12/24 0820) Resp:  [16-18] 16 (12/24 0820) BP: (113-134)/(46-57) 116/49 (12/24 0820) SpO2:  [90 %-95 %] 93 % (12/24 0820)  Intake/Output from previous day: 12/23 0701 - 12/24 0700 In: 360 [P.O.:360] Out: -   Recent Labs    04/03/17 1952 04/04/17 0324  WBC 11.8*  --   HGB 12.7  --   HCT 38.4  --   PLT 227  --   K 4.0 3.9  CL 108 110  CO2 21* 24  BUN 32* 27*  CREATININE 1.23* 1.01*  GLUCOSE 209* 146*  CALCIUM 9.0 8.9    EXAM General: Frail appearing female in no apparent discomfort Lungs: clear to auscultation Cardiac: Irregular rate and rhythm Right lower extremity: Shortened and externally rotated. Negative Homan. Neurologic: Awake, alert, and oriented. Sensory and motor function are grossly intact.   Assessment: Right femoral neck fracture   Secondary diagnoses: Hypoxia COPD Sleep apnea GERD Hyperthyroidism Diabetes Hypertension CAD Chronic kidney disease Chronic atrial fibrillation  Plan: The findings were discussed in detail with the patient. Recommendation was made for hip hemiarthroplasty. The usual perioperative course was discussed. The risks and benefits of surgical intervention were reviewed. The patient and her son expressed understanding of the risks and benefits and agreed with plans for surgical intervention.  The surgical site was signed as per the "right site surgery" protocol.  Discussed with Dr. Marcello Moores. Anesthesiology want to do spinal anesthesia but needs to wait until tomorrow due to her preadmission anticoagulation with Eliquis. Plan for right hip hemiarthroplasty under spinal anesthesia tomorrow at [facility]. Holley Bouche M.D.

## 2017-04-06 NOTE — Progress Notes (Signed)
Pt alert resting in bed. Family at bedside. PRN pain medication given for right hip pain. Pt on 2L O2. IV saline locked. Pt confused at times but cooperative. Will continue to monitor.

## 2017-04-06 NOTE — Clinical Social Work Note (Signed)
Clinical Social Work Assessment  Patient Details  Name: Sarah Dougherty MRN: [id number] Date of Birth: 10/19/1921  Date of referral:  04/06/17               Reason for consult:  Facility Placement                Permission sought to share information with:  Facility Contact Representative Permission granted to share information::  Yes, Verbal Permission Granted  Name::      Skilled Nursing Facility   Agency::   [facility] [facility]   Relationship::     Contact Information:     Housing/Transportation Living arrangements for the past 2 months:  Single Family Home Source of Information:  Patient, Adult Children Patient Interpreter Needed:  None Criminal Activity/Legal Involvement Pertinent to Current Situation/Hospitalization:  No - Comment as needed Significant Relationships:  Adult Children Lives with:  Adult Children Do you feel safe going back to the place where you live?  Yes Need for family participation in patient care:  Yes (Comment)  Care giving concerns:  Patient lives with her son [name] in [place].    Social Worker assessment / plan:  Clinical Social Worker (CSW) reviewed chart and noted that patient is having surgery for a hip fracture. CSW met with patient and her son Mike [phone] and granddaughter Stephanie were at bedside. CSW introduced self and explained role of CSW department. Per son patient lives in [place] with her other son [name]. CSW explained that after surgery PT will evaluate patient and make a recommendation of SNF or home health. Patient and her son Mike prefer SNF. Per Mike patient has been to [facility] in the past. CSW also explained that [facility] requires authorization. Patient and her son Mike are agreeable to SNF search in [place]. FL2 complete and faxed out. Health Team case manager is aware of above. CSW will continue to follow and assist as needed.    Employment status:  Disabled (Comment on whether or not currently receiving  Disability), Retired Insurance information:  Managed Medicare PT Recommendations:  Not assessed at this time Information / Referral to community resources:  Skilled Nursing Facility  Patient/Family's Response to care:  Patient and her son are agreeable to SNF search in [place].   Patient/Family's Understanding of and Emotional Response to Diagnosis, Current Treatment, and Prognosis:  Patient and her son were very pleasant and thanked CSW for assistance.   Emotional Assessment Appearance:  Appears stated age Attitude/Demeanor/Rapport:    Affect (typically observed):  Accepting, Adaptable, Pleasant Orientation:  Oriented to Self, Oriented to Place, Oriented to  Time, Fluctuating Orientation (Suspected and/or reported Sundowners) Alcohol / Substance use:  Not Applicable Psych involvement (Current and /or in the community):  No (Comment)  Discharge Needs  Concerns to be addressed:  Discharge Planning Concerns Readmission within the last 30 days:  No Current discharge risk:  Dependent with Mobility Barriers to Discharge:  Continued Medical Work up   ,  M, LCSW 04/06/2017, 2:39 PM  

## 2017-04-07 ENCOUNTER — Encounter: Payer: Self-pay | Admitting: Anesthesiology

## 2017-04-07 ENCOUNTER — Inpatient Hospital Stay: Payer: PPO | Admitting: Anesthesiology

## 2017-04-07 ENCOUNTER — Inpatient Hospital Stay: Payer: PPO

## 2017-04-07 ENCOUNTER — Encounter
Admission: EM | Disposition: A | Discharge status: Skilled Nursing Facility | Payer: Self-pay | Source: Home / Self Care | Attending: Internal Medicine

## 2017-04-07 HISTORY — PX: JOINT REPLACEMENT: SHX530

## 2017-04-07 HISTORY — PX: HIP ARTHROPLASTY: SHX981

## 2017-04-07 LAB — GLUCOSE, CAPILLARY
GLUCOSE-CAPILLARY: 143 mg/dL — AB (ref 65–99)
GLUCOSE-CAPILLARY: 147 mg/dL — AB (ref 65–99)
GLUCOSE-CAPILLARY: 254 mg/dL — AB (ref 65–99)
Glucose-Capillary: 97 mg/dL (ref 65–99)
Glucose-Capillary: 177 mg/dL — ABNORMAL HIGH (ref 65–99)

## 2017-04-07 SURGERY — HEMIARTHROPLASTY, HIP, DIRECT ANTERIOR APPROACH, FOR FRACTURE
Anesthesia: Choice | Laterality: Right

## 2017-04-07 SURGERY — HEMIARTHROPLASTY, HIP, DIRECT ANTERIOR APPROACH, FOR FRACTURE
Anesthesia: Spinal | Site: Hip | Laterality: Right | Wound class: Clean

## 2017-04-07 MED ORDER — FERROUS SULFATE 325 (65 FE) MG PO TABS
325.0000 mg | ORAL_TABLET | Freq: Two times a day (BID) | ORAL | Status: DC
  Administered 2017-04-08 – 2017-04-09 (×3): 325 mg via ORAL
  Filled 2017-04-07 (×3): qty 1

## 2017-04-07 MED ORDER — ACETAMINOPHEN 10 MG/ML IV SOLN
INTRAVENOUS | Status: DC | PRN
  Administered 2017-04-07: 1000 mg via INTRAVENOUS

## 2017-04-07 MED ORDER — SODIUM CHLORIDE 0.9 % IV SOLN
INTRAVENOUS | Status: DC
  Administered 2017-04-07: 23:00:00 via INTRAVENOUS

## 2017-04-07 MED ORDER — PROPOFOL 10 MG/ML IV BOLUS
INTRAVENOUS | Status: DC | PRN
  Administered 2017-04-07: 20 mg via INTRAVENOUS

## 2017-04-07 MED ORDER — MORPHINE SULFATE (PF) 2 MG/ML IV SOLN: 2.0000 mg | INTRAVENOUS | Status: DC | PRN

## 2017-04-07 MED ORDER — OXYCODONE HCL 5 MG PO TABS
5.0000 mg | ORAL_TABLET | ORAL | Status: DC | PRN
  Administered 2017-04-07 – 2017-04-09 (×3): 5 mg via ORAL
  Filled 2017-04-07 (×3): qty 1

## 2017-04-07 MED ORDER — DEXAMETHASONE SODIUM PHOSPHATE 10 MG/ML IJ SOLN
INTRAMUSCULAR | Status: DC | PRN
  Administered 2017-04-07: 5 mg via INTRAVENOUS

## 2017-04-07 MED ORDER — ACETAMINOPHEN 10 MG/ML IV SOLN
1000.0000 mg | Freq: Four times a day (QID) | INTRAVENOUS | Status: AC
  Administered 2017-04-07 – 2017-04-08 (×3): 1000 mg via INTRAVENOUS
  Filled 2017-04-07 (×4): qty 100

## 2017-04-07 MED ORDER — LIDOCAINE HCL (PF) 2 % IJ SOLN
INTRAMUSCULAR | Status: DC | PRN
  Administered 2017-04-07: 50 mg

## 2017-04-07 MED ORDER — ACETAMINOPHEN 650 MG RE SUPP: 650.0000 mg | Freq: Four times a day (QID) | RECTAL | Status: DC | PRN

## 2017-04-07 MED ORDER — PROPOFOL 500 MG/50ML IV EMUL
INTRAVENOUS | Status: DC | PRN
  Administered 2017-04-07: 25 ug/kg/min via INTRAVENOUS

## 2017-04-07 MED ORDER — TRAMADOL HCL 50 MG PO TABS
50.0000 mg | ORAL_TABLET | ORAL | Status: DC | PRN
  Administered 2017-04-08 (×2): 100 mg via ORAL
  Filled 2017-04-07 (×2): qty 2

## 2017-04-07 MED ORDER — METOCLOPRAMIDE HCL 10 MG PO TABS
5.0000 mg | ORAL_TABLET | Freq: Three times a day (TID) | ORAL | Status: DC | PRN
  Administered 2017-04-08: 10 mg via ORAL

## 2017-04-07 MED ORDER — ALBUMIN HUMAN 5 % IV SOLN
INTRAVENOUS | Status: AC
  Administered 2017-04-07: 12.5 g via INTRAVENOUS
  Filled 2017-04-07: qty 250

## 2017-04-07 MED ORDER — NEOMYCIN-POLYMYXIN B GU 40-200000 IR SOLN
Status: DC | PRN
Start: 2017-04-07 — End: 2017-04-07
  Administered 2017-04-07: 16 mL

## 2017-04-07 MED ORDER — ALBUMIN HUMAN 5 % IV SOLN
12.5000 g | Freq: Once | INTRAVENOUS | Status: AC
  Administered 2017-04-07: 12.5 g via INTRAVENOUS

## 2017-04-07 MED ORDER — MIDAZOLAM HCL 5 MG/5ML IJ SOLN
INTRAMUSCULAR | Status: DC | PRN
  Administered 2017-04-07 (×2): 0.5 mg via INTRAVENOUS

## 2017-04-07 MED ORDER — BISACODYL 10 MG RE SUPP: 10.0000 mg | Freq: Every day | RECTAL | Status: DC | PRN

## 2017-04-07 MED ORDER — TETRACAINE HCL 1 % IJ SOLN
INTRAMUSCULAR | Status: DC | PRN
  Administered 2017-04-07: 6 mg via INTRASPINAL

## 2017-04-07 MED ORDER — FLEET ENEMA 7-19 GM/118ML RE ENEM
1.0000 | ENEMA | Freq: Once | RECTAL | Status: AC | PRN
  Administered 2017-04-09: 1 via RECTAL

## 2017-04-07 MED ORDER — ENOXAPARIN SODIUM 30 MG/0.3ML ~~LOC~~ SOLN
30.0000 mg | SUBCUTANEOUS | Status: DC
  Administered 2017-04-08 – 2017-04-09 (×2): 30 mg via SUBCUTANEOUS
  Filled 2017-04-07 (×2): qty 0.3

## 2017-04-07 MED ORDER — ONDANSETRON HCL 4 MG/2ML IJ SOLN: 4.0000 mg | Freq: Once | INTRAMUSCULAR | Status: DC | PRN

## 2017-04-07 MED ORDER — MAGNESIUM HYDROXIDE 400 MG/5ML PO SUSP
30.0000 mL | Freq: Every day | ORAL | Status: DC | PRN
  Administered 2017-04-08: 30 mL via ORAL
  Filled 2017-04-07: qty 30

## 2017-04-07 MED ORDER — KETAMINE HCL 50 MG/ML IJ SOLN
INTRAMUSCULAR | Status: DC | PRN
  Administered 2017-04-07: 25 mg via INTRAVENOUS

## 2017-04-07 MED ORDER — ONDANSETRON HCL 4 MG PO TABS: 4.0000 mg | ORAL_TABLET | Freq: Four times a day (QID) | ORAL | Status: DC | PRN

## 2017-04-07 MED ORDER — METOCLOPRAMIDE HCL 10 MG PO TABS
10.0000 mg | ORAL_TABLET | Freq: Three times a day (TID) | ORAL | Status: DC
  Administered 2017-04-07 – 2017-04-09 (×7): 10 mg via ORAL
  Filled 2017-04-07 (×7): qty 1

## 2017-04-07 MED ORDER — PHENYLEPHRINE HCL 10 MG/ML IJ SOLN
INTRAMUSCULAR | Status: DC | PRN
  Administered 2017-04-07: 25 ug/min via INTRAVENOUS

## 2017-04-07 MED ORDER — WHITE PETROLATUM EX OINT
TOPICAL_OINTMENT | CUTANEOUS | Status: AC
  Filled 2017-04-07: qty 5

## 2017-04-07 MED ORDER — ONDANSETRON HCL 4 MG/2ML IJ SOLN: 4.0000 mg | Freq: Four times a day (QID) | INTRAMUSCULAR | Status: DC | PRN

## 2017-04-07 MED ORDER — FENTANYL CITRATE (PF) 100 MCG/2ML IJ SOLN: 25.0000 ug | INTRAMUSCULAR | Status: DC | PRN

## 2017-04-07 MED ORDER — METOCLOPRAMIDE HCL 5 MG/ML IJ SOLN: 5.0000 mg | Freq: Three times a day (TID) | INTRAMUSCULAR | Status: DC | PRN

## 2017-04-07 MED ORDER — BUPIVACAINE HCL (PF) 0.5 % IJ SOLN
INTRAMUSCULAR | Status: DC | PRN
  Administered 2017-04-07: 2.4 mL via INTRATHECAL

## 2017-04-07 MED ORDER — SODIUM CHLORIDE 0.9 % IV SOLN
INTRAVENOUS | Status: AC | PRN
  Administered 2017-04-07: 200 ug/min via INTRAVENOUS

## 2017-04-07 MED ORDER — CLINDAMYCIN PHOSPHATE 600 MG/50ML IV SOLN
600.0000 mg | Freq: Four times a day (QID) | INTRAVENOUS | Status: AC
  Administered 2017-04-07 – 2017-04-08 (×2): 600 mg via INTRAVENOUS
  Filled 2017-04-07 (×2): qty 50

## 2017-04-07 MED ORDER — EPHEDRINE SULFATE 50 MG/ML IJ SOLN
INTRAMUSCULAR | Status: DC | PRN
  Administered 2017-04-07 (×3): 10 mg via INTRAVENOUS

## 2017-04-07 MED ORDER — ACETAMINOPHEN 325 MG PO TABS
650.0000 mg | ORAL_TABLET | Freq: Four times a day (QID) | ORAL | Status: DC | PRN
  Administered 2017-04-09: 650 mg via ORAL
  Filled 2017-04-07: qty 2

## 2017-04-07 MED ORDER — SENNOSIDES-DOCUSATE SODIUM 8.6-50 MG PO TABS
1.0000 | ORAL_TABLET | Freq: Two times a day (BID) | ORAL | Status: DC
  Administered 2017-04-07 – 2017-04-09 (×4): 1 via ORAL
  Filled 2017-04-07 (×4): qty 1

## 2017-04-07 MED ORDER — MENTHOL 3 MG MT LOZG
1.0000 | LOZENGE | OROMUCOSAL | Status: DC | PRN
  Filled 2017-04-07: qty 9

## 2017-04-07 MED ORDER — PHENYLEPHRINE HCL 10 MG/ML IJ SOLN
INTRAMUSCULAR | Status: DC | PRN
  Administered 2017-04-07: 100 ug via INTRAVENOUS
  Administered 2017-04-07 (×2): 50 ug via INTRAVENOUS

## 2017-04-07 MED ORDER — PHENOL 1.4 % MT LIQD
1.0000 | OROMUCOSAL | Status: DC | PRN
  Filled 2017-04-07: qty 177

## 2017-04-07 MED ORDER — PANTOPRAZOLE SODIUM 40 MG PO TBEC
40.0000 mg | DELAYED_RELEASE_TABLET | Freq: Two times a day (BID) | ORAL | Status: DC
  Administered 2017-04-07 – 2017-04-09 (×4): 40 mg via ORAL
  Filled 2017-04-07 (×4): qty 1

## 2017-04-07 SURGICAL SUPPLY — 55 items
BAG DECANTER FOR FLEXI CONT (MISCELLANEOUS) ×2 IMPLANT
BLADE SAW 1 (BLADE) ×2 IMPLANT
CANISTER SUCT 1200ML W/VALVE (MISCELLANEOUS) ×2 IMPLANT
CANISTER SUCT 3000ML PPV (MISCELLANEOUS) ×4 IMPLANT
CAPT HIP HEMI 1 ×2 IMPLANT
CEMENT HV SMART SET (Cement) ×4 IMPLANT
DRAPE INCISE IOBAN 66X60 STRL (DRAPES) ×2 IMPLANT
DRAPE SHEET LG 3/4 BI-LAMINATE (DRAPES) ×2 IMPLANT
DRAPE TABLE BACK 80X90 (DRAPES) ×2 IMPLANT
DRSG DERMACEA 8X12 NADH (GAUZE/BANDAGES/DRESSINGS) ×2 IMPLANT
DRSG OPSITE POSTOP 4X12 (GAUZE/BANDAGES/DRESSINGS) ×2 IMPLANT
DRSG OPSITE POSTOP 4X14 (GAUZE/BANDAGES/DRESSINGS) ×2 IMPLANT
DRSG TEGADERM 4X4.75 (GAUZE/BANDAGES/DRESSINGS) ×2 IMPLANT
DURAPREP 26ML APPLICATOR (WOUND CARE) ×4 IMPLANT
ELECT BLADE 6.5 EXT (BLADE) ×2 IMPLANT
ELECT CAUTERY BLADE 6.4 (BLADE) ×2 IMPLANT
ELECT REM PT RETURN 9FT ADLT (ELECTROSURGICAL) ×2
ELECTRODE REM PT RTRN 9FT ADLT (ELECTROSURGICAL) ×1 IMPLANT
EVACUATOR 1/8 PVC DRAIN (DRAIN) ×2 IMPLANT
GAUZE PACK 2X3YD (MISCELLANEOUS) ×2 IMPLANT
GLOVE BIOGEL M STRL SZ7.5 (GLOVE) ×4 IMPLANT
GLOVE INDICATOR 8.0 STRL GRN (GLOVE) ×2 IMPLANT
GOWN STRL REUS W/ TWL LRG LVL3 (GOWN DISPOSABLE) ×2 IMPLANT
GOWN STRL REUS W/TWL LRG LVL3 (GOWN DISPOSABLE) ×2
HOLDER FOLEY CATH W/STRAP (MISCELLANEOUS) ×2 IMPLANT
HOOD PEEL AWAY FLYTE STAYCOOL (MISCELLANEOUS) ×4 IMPLANT
IV NS 100ML SINGLE PACK (IV SOLUTION) ×2 IMPLANT
KIT RM TURNOVER STRD PROC AR (KITS) ×2 IMPLANT
NDL SAFETY ECLIPSE 18X1.5 (NEEDLE) ×2 IMPLANT
NEEDLE FILTER BLUNT 18X 1/2SAF (NEEDLE) ×1
NEEDLE FILTER BLUNT 18X1 1/2 (NEEDLE) ×1 IMPLANT
NEEDLE HYPO 18GX1.5 SHARP (NEEDLE) ×2
NS IRRIG 1000ML POUR BTL (IV SOLUTION) ×2 IMPLANT
PACK HIP PROSTHESIS (MISCELLANEOUS) ×2 IMPLANT
PRESSURIZER CEMENT PROX FEM SM (MISCELLANEOUS) ×2 IMPLANT
PRESSURIZER FEM CANAL M (MISCELLANEOUS) ×2 IMPLANT
PULSAVAC PLUS IRRIG FAN TIP (DISPOSABLE) ×2
SOL .9 NS 3000ML IRR  AL (IV SOLUTION) ×1
SOL .9 NS 3000ML IRR UROMATIC (IV SOLUTION) ×1 IMPLANT
SOL PREP PVP 2OZ (MISCELLANEOUS) ×2
SOLUTION PREP PVP 2OZ (MISCELLANEOUS) ×1 IMPLANT
SPONGE DRAIN TRACH 4X4 STRL 2S (GAUZE/BANDAGES/DRESSINGS) ×2 IMPLANT
STAPLER SKIN PROX 35W (STAPLE) ×2 IMPLANT
SUT ETHIBOND #5 BRAIDED 30INL (SUTURE) ×2 IMPLANT
SUT VIC AB 0 CT1 36 (SUTURE) ×2 IMPLANT
SUT VIC AB 1 CT1 36 (SUTURE) ×6 IMPLANT
SUT VIC AB 2-0 CT1 27 (SUTURE) ×1
SUT VIC AB 2-0 CT1 TAPERPNT 27 (SUTURE) ×1 IMPLANT
SYR 10ML LL (SYRINGE) ×2 IMPLANT
SYR 20CC LL (SYRINGE) ×2 IMPLANT
SYR TB 1ML LUER SLIP (SYRINGE) ×2 IMPLANT
TAPE TRANSPORE STRL 2 31045 (GAUZE/BANDAGES/DRESSINGS) ×2 IMPLANT
TIP BRUSH PULSAVAC PLUS 24.33 (MISCELLANEOUS) ×2 IMPLANT
TIP FAN IRRIG PULSAVAC PLUS (DISPOSABLE) ×1 IMPLANT
TOWER CARTRIDGE SMART MIX (DISPOSABLE) ×2 IMPLANT

## 2017-04-07 NOTE — Anesthesia Post-op Follow-up Note (Signed)
Anesthesia QCDR form completed.        

## 2017-04-07 NOTE — Op Note (Signed)
OPERATIVE NOTE  DATE OF SURGERY:  04/07/2017  PATIENT NAME:  Sarah Dougherty   DOB: 25-Jan-1922  MRN: [id number]  PRE-OPERATIVE DIAGNOSIS: Right femoral neck fracture  POST-OPERATIVE DIAGNOSIS:  Same  PROCEDURE:  Right hip hemiarthroplasty  SURGEON:  Marciano Sequin. M.D.  ANESTHESIA: spinal  ESTIMATED BLOOD LOSS: 100 mL  FLUIDS REPLACED: 800 mL of crystalloid  DRAINS: 2 medium drains to a Hemovac reservoir  IMPLANTS UTILIZED: DePuy size 2 Summit femoral stem (cemented), 11 mm Cementralizer, 44 mm OD Cathcart hip ball, -3 mm tapered spacer, and a size 4 femoral cement restrictor  INDICATIONS FOR SURGERY: Sarah Dougherty is a 83 y.o. year old female who fell and sustained a displaced right femoral neck fracture. After discussion of the risks and benefits of surgical intervention, the patient expressed understanding of the risks benefits and agree with plans for hip hemiarthroplasty.   The risks, benefits, and alternatives were discussed at length including but not limited to the risks of infection, bleeding, nerve injury, stiffness, blood clots, the need for revision surgery, limb length inequality, dislocation, cardiopulmonary complications, among others, and they were willing to proceed.  PROCEDURE IN DETAIL: The patient was brought into the operating room and, after adequate spinal anesthesia was achieved, patient was placed in a left lateral decubitus position. Axillary roll was placed and all bony prominences were well-padded. The patient's right hip was cleaned and prepped with alcohol and DuraPrep and draped in the usual sterile fashion. A "timeout" was performed as per usual protocol. A lateral curvilinear incision was made gently curving towards the posterior superior iliac spine. The IT band was incised in line with the skin incision and the fibers of the gluteus maximus were split in line. The piriformis tendon was identified, skeletonized, and incised at its insertion to the  proximal femur and reflected posteriorly. A T type posterior capsulotomy was performed. The femoral head was then removed using a corkscrew device. The femoral head was measured using calipers and ring gauges and determined to be 44 mm in diameter.The femoral neck cut was performed using an oscillating saw. The acetabulum was inspected for any bony fragments. The articular surface was in good condition.  Attention was then directed to the proximal femur. A pilot hole for preparation of the proximal femoral canal was created using a high-speed bur. The femoral canal finder was inserted followed by insertion of the conical reamer. Serial broaches were inserted up to a size 2 broach. Calcar region was planed and a trial reduction was performed using a 44 mm OD Cathcart ball with a -3 mm neck length. Good equalization of limb lengths was appreciated and excellent stability was noted both anteriorly and posteriorly. Trial components were removed. The femoral canal was sized and was felt that a size 4 cement restrictor was appropriate. The cement restrictor was inserted to the appropriate depth in the femoral canal was irrigated with copious amounts of fluid using the pulse lavage and suctioned dry. The femoral canal was then packed with vaginal packing soaked in dilute Neo-Synephrine. Polymethylmethacrylate cement was prepared in the usual fashion using a vacuum mixer. Vaginal packing was removed and the canal again irrigated and suctioned dry. The polymethylmethacrylate cement was inserted in retrograde fashion and pressurized. The size 2 Summit femoral component with an 11 mm Cementralizer was positioned and impacted into place. Excess cement was removed using [employer] fast streamer. After adequate curing of the cement, the Morse taper was cleaned and dried. A 44 mm outer diameter Cathcart  hip ball with a -3 mm tapered spacer was placed on the trunnion and impacted into place. The acetabulum was again irrigated and  suctioned dry, making sure to inspect for any residal bony debris. The femoral head was then reduced and placed through a range of motion. Excellent stability was noted both anteriorly and posteriorly. Good equalization of limb lengths was appreciated.   The wound was irrigated with copious amounts of normal saline with antibiotic solution and suctioned dry. Good hemostasis was appreciated. The posterior capsulotomy was repaired using #5 Ethibond. Piriformis tendon was reapproximated to the undersurface of the gluteus medius tendon using #5 Ethibond. Two medium drains were placed in the wound bed and brought out through separate stab incisions to be attached to a Hemovac reservoir. The IT band was reapproximated using interrupted sutures of #1 Vicryl. Subcutaneous tissue was proximal phalanx using first #0 Vicryl followed by #2-0 Vicryl. The skin was closed with skin staples.  The patient tolerated the procedure well and was transported to the recovery room in stable condition.   Marciano Sequin., M.D.

## 2017-04-07 NOTE — Anesthesia Procedure Notes (Addendum)
Spinal  Patient location during procedure: OR Start time: 04/07/2017 2:39 PM End time: 04/07/2017 2:47 PM Staffing Anesthesiologist: Karenz, Andrew, MD Resident/CRNA: , , CRNA Performed: resident/CRNA  Preanesthetic Checklist Completed: patient identified, site marked, surgical consent, pre-op evaluation, timeout performed, IV checked, risks and benefits discussed and monitors and equipment checked Spinal Block Patient position: right lateral decubitus Prep: ChloraPrep and site prepped and draped Patient monitoring: heart rate, continuous pulse ox, blood pressure and cardiac monitor Approach: midline Location: L4-5 Injection technique: single-shot Needle Needle type: Whitacre and Introducer  Needle gauge: 24 G Needle length: 9 cm Assessment Sensory level: T10 Additional Notes Negative paresthesia. Negative blood return. Positive free-flowing CSF. Expiration date of kit checked and confirmed. Patient tolerated procedure well, without complications.       

## 2017-04-07 NOTE — Progress Notes (Signed)
[facility] at [place] NAME: Quinlan Mcfall    MR#:  [id number]  DATE OF BIRTH:  1921/12/01  SUBJECTIVE:  CHIEF COMPLAINT:   Chief Complaint  Patient presents with  . Fall   No SOB Right hip pain  REVIEW OF SYSTEMS:    Review of Systems  Constitutional: Positive for malaise/fatigue. Negative for chills and fever.  HENT: Negative for sore throat.   Eyes: Negative for blurred vision, double vision and pain.  Respiratory: Negative for cough, hemoptysis, shortness of breath and wheezing.   Cardiovascular: Negative for chest pain, palpitations, orthopnea and leg swelling.  Gastrointestinal: Negative for abdominal pain, constipation, diarrhea, heartburn, nausea and vomiting.  Genitourinary: Negative for dysuria and hematuria.  Musculoskeletal: Positive for falls and joint pain. Negative for back pain.  Skin: Negative for rash.  Neurological: Negative for sensory change, speech change, focal weakness and headaches.  Endo/Heme/Allergies: Does not bruise/bleed easily.  Psychiatric/Behavioral: Negative for depression. The patient is not nervous/anxious.     DRUG ALLERGIES:   Allergies  Allergen Reactions  . Amoxicillin Other (See Comments)    unknown  . Cephalexin Other (See Comments)    unknown  . Nitrofurantoin Other (See Comments)     Unknown  . Penicillins Other (See Comments)    Other reaction(s): Unknown  . Sulfa Antibiotics Hives and Itching  . Azithromycin Rash    VITALS:  Blood pressure (!) 116/55, pulse (!) 59, temperature 98.2 F (36.8 C), temperature source Oral, resp. rate 17, height 5\' 2"  (1.575 m), weight 48.8 kg (107 lb 8 oz), SpO2 97 %.  PHYSICAL EXAMINATION:   Physical Exam  GENERAL:  83 y.o.-year-old patient lying in the bed with no acute distress.  EYES:  No scleral icterus. Extraocular muscles intact.  HEENT: Head atraumatic, normocephalic. Oropharynx and nasopharynx clear.  NECK:  Supple, no jugular venous  distention. No thyroid enlargement, no tenderness.  LUNGS: Normal breath sounds bilaterally, no wheezing, rales, rhonchi. No use of accessory muscles of respiration.  CARDIOVASCULAR: S1, S2 normal. No murmurs, rubs, or gallops.  ABDOMEN: Soft, nontender, nondistended. Bowel sounds present. No organomegaly or mass.  EXTREMITIES: No cyanosis, clubbing or edema b/l.    NEUROLOGIC: Cranial nerves II through XII are intact. No focal Motor or sensory deficits b/l.   PSYCHIATRIC: The patient is alert and awake SKIN: No obvious rash, lesion, or ulcer.   LABORATORY PANEL:   CBC Recent Labs  Lab 04/06/17 1917  WBC 8.9  HGB 12.1  HCT 37.0  PLT 225   ------------------------------------------------------------------------------------------------------------------ Chemistries  Recent Labs  Lab 04/04/17 0324  NA 141  K 3.9  CL 110  CO2 24  GLUCOSE 146*  BUN 27*  CREATININE 1.01*  CALCIUM 8.9   ------------------------------------------------------------------------------------------------------------------  Cardiac Enzymes No results for input(s): TROPONINI in the last 168 hours. ------------------------------------------------------------------------------------------------------------------  RADIOLOGY:  No results found.   ASSESSMENT AND PLAN:   # Hypoxia due to chronic bronchitis. CXR shows COPD Incentive spirometer., Continue oxygen. Mild chronic SOB is unchanged  # Acute right femoral neck fracture status post mechanical fall Surgery tomorrow Off eliquis Pain meds, PT after surgery, DVT prophylaxis per surgery  # Chronic A. Fib Stable. Continue amiodarone Eliquis on hold  # Diabetes mellitus type 2 SSI  # COPD without exacerbation Breathing treatments as needed  # Right eye infection Cipro eyedrops  All the records are reviewed and case discussed with Care Management/Social Worker Management plans discussed with the patient, family and they are  in  agreement.  CODE STATUS: FULL CODE  DVT Prophylaxis: SCDs  TOTAL TIME TAKING CARE OF THIS PATIENT: 35 minutes.   Leia Alf Cobin Cadavid M.D on 04/07/2017 at 10:47 AM  Between 7am to 6pm - Pager - [phone]  After 6pm go to [email or web address] - password [email or web address] [facility]  Office  [phone]  CC: Primary care physician; Perrin Maltese, MD  Note: This dictation was prepared with Dragon dictation along with smaller phrase technology. Any transcriptional errors that result from this process are unintentional.

## 2017-04-07 NOTE — Progress Notes (Signed)
[facility] at [place] NAME: Sarah Dougherty    MR#:  [id number]  DATE OF BIRTH:  1921/06/27  SUBJECTIVE:  CHIEF COMPLAINT:   Chief Complaint  Patient presents with  . Fall   No SOB  Pain right hip well controlled with meds.  Waiting for surgery later today  REVIEW OF SYSTEMS:    Review of Systems  Constitutional: Positive for malaise/fatigue. Negative for chills and fever.  HENT: Negative for sore throat.   Eyes: Negative for blurred vision, double vision and pain.  Respiratory: Negative for cough, hemoptysis, shortness of breath and wheezing.   Cardiovascular: Negative for chest pain, palpitations, orthopnea and leg swelling.  Gastrointestinal: Negative for abdominal pain, constipation, diarrhea, heartburn, nausea and vomiting.  Genitourinary: Negative for dysuria and hematuria.  Musculoskeletal: Positive for falls and joint pain. Negative for back pain.  Skin: Negative for rash.  Neurological: Negative for sensory change, speech change, focal weakness and headaches.  Endo/Heme/Allergies: Does not bruise/bleed easily.  Psychiatric/Behavioral: Negative for depression. The patient is not nervous/anxious.     DRUG ALLERGIES:   Allergies  Allergen Reactions  . Amoxicillin Other (See Comments)    unknown  . Cephalexin Other (See Comments)    unknown  . Nitrofurantoin Other (See Comments)     Unknown  . Penicillins Other (See Comments)    Other reaction(s): Unknown  . Sulfa Antibiotics Hives and Itching  . Azithromycin Rash    VITALS:  Blood pressure (!) 116/55, pulse (!) 59, temperature 98.2 F (36.8 C), temperature source Oral, resp. rate 17, height 5\' 2"  (1.575 m), weight 48.8 kg (107 lb 8 oz), SpO2 97 %.  PHYSICAL EXAMINATION:   Physical Exam  GENERAL:  83 y.o.-year-old patient lying in the bed with no acute distress.  EYES:  No scleral icterus. Extraocular muscles intact.  HEENT: Head atraumatic, normocephalic.  Oropharynx and nasopharynx clear.  NECK:  Supple, no jugular venous distention. No thyroid enlargement, no tenderness.  LUNGS: Normal breath sounds bilaterally, no wheezing, rales, rhonchi. No use of accessory muscles of respiration.  CARDIOVASCULAR: S1, S2 normal. No murmurs, rubs, or gallops.  ABDOMEN: Soft, nontender, nondistended. Bowel sounds present. No organomegaly or mass.  EXTREMITIES: No cyanosis, clubbing or edema b/l.    NEUROLOGIC: Cranial nerves II through XII are intact. No focal Motor or sensory deficits b/l.   PSYCHIATRIC: The patient is alert and awake SKIN: No obvious rash, lesion, or ulcer.   LABORATORY PANEL:   CBC Recent Labs  Lab 04/06/17 1917  WBC 8.9  HGB 12.1  HCT 37.0  PLT 225   ------------------------------------------------------------------------------------------------------------------ Chemistries  Recent Labs  Lab 04/04/17 0324  NA 141  K 3.9  CL 110  CO2 24  GLUCOSE 146*  BUN 27*  CREATININE 1.01*  CALCIUM 8.9   ------------------------------------------------------------------------------------------------------------------  Cardiac Enzymes No results for input(s): TROPONINI in the last 168 hours. ------------------------------------------------------------------------------------------------------------------  RADIOLOGY:  No results found.   ASSESSMENT AND PLAN:   # Hypoxia due to chronic bronchitis. Needs home O2 at discharge. CXR shows COPD Added incentive spirometer. Continue oxygen  # Acute right femoral neck fracture status post mechanical fall Surgery today Off eliquis Pain meds PT after surgery DVT prophylaxis per surgery  # Chronic A. Fib Stable. Continue amiodarone,Eliquis on hold as stated above for operative intervention.  # Diabetes mellitus type 2 SSI  # COPD without exacerbation Breathing treatments as needed  # Right eye infection Cipro eyedrops  All the records are reviewed  and case  discussed with Care Management/Social Worker Management plans discussed with the patient, family and they are in agreement.  CODE STATUS: FULL CODE  DVT Prophylaxis: SCDs  TOTAL TIME TAKING CARE OF THIS PATIENT: 35 minutes.   POSSIBLE D/C IN 3-4 DAYS, DEPENDING ON CLINICAL CONDITION.  Leia Alf Seerat Peaden M.D on 04/07/2017 at 10:45 AM  Between 7am to 6pm - Pager - [phone]  After 6pm go to [email or web address] - password [email or web address] [facility]  Office  [phone]  CC: Primary care physician; Perrin Maltese, MD  Note: This dictation was prepared with Dragon dictation along with smaller phrase technology. Any transcriptional errors that result from this process are unintentional.

## 2017-04-07 NOTE — Transfer of Care (Signed)
Immediate Anesthesia Transfer of Care Note  Patient: Sarah LAMPI  Procedure(s) Performed: ARTHROPLASTY BIPOLAR HIP (HEMIARTHROPLASTY) (Right Hip)  Patient Location: PACU  Anesthesia Type:Spinal  Level of Consciousness: awake  Airway & Oxygen Therapy: Patient Spontanous Breathing and Patient connected to face mask oxygen  Post-op Assessment: Report given to RN and Post -op Vital signs reviewed and stable  Post vital signs: Reviewed  Last Vitals:  Vitals:   04/07/17 0918 04/07/17 1734  BP: (!) 116/55 (!) 91/42  Pulse: (!) 59 63  Resp: 17 18  Temp: 36.8 C 36.6 C  SpO2: 97% 98%    Last Pain:  Vitals:   04/07/17 0918  TempSrc: Oral  PainSc:       Patients Stated Pain Goal: 1 (58/30/94 0768)  Complications: No apparent anesthesia complications

## 2017-04-07 NOTE — Anesthesia Preprocedure Evaluation (Signed)
Anesthesia Evaluation  Patient identified by MRN, date of birth, ID band Patient awake    Reviewed: Allergy & Precautions, H&P , NPO status , Patient's Chart, lab work & pertinent test results, reviewed documented beta blocker date and time   Airway Mallampati: II  TM Distance: >3 FB Neck ROM: full    Dental  (+) Edentulous Upper, Edentulous Lower, Poor Dentition, Dental Advidsory Given   Pulmonary neg pulmonary ROS, neg shortness of breath, asthma , sleep apnea , COPD,  oxygen dependent, neg recent URI,           Cardiovascular Exercise Tolerance: Good hypertension, (-) angina+ CAD  (-) Past MI, (-) Cardiac Stents and (-) CABG negative cardio ROS  + dysrhythmias + pacemaker (-) Valvular Problems/Murmurs     Neuro/Psych negative neurological ROS  negative psych ROS   GI/Hepatic Neg liver ROS, GERD  ,  Endo/Other  negative endocrine ROSdiabetesHyperthyroidism   Renal/GU Renal disease  negative genitourinary   Musculoskeletal   Abdominal   Peds  Hematology negative hematology ROS (+)   Anesthesia Other Findings Past Medical History: No date: A-fib (HCC) No date: Arthritis     Comment:  Osteoarthritis No date: Asthma No date: Chronic kidney disease     Comment:  UTI No date: COPD (chronic obstructive pulmonary disease) (HCC) No date: Coronary artery disease No date: Diabetes mellitus without complication (HCC) No date: GERD (gastroesophageal reflux disease) No date: Glaucoma (increased eye pressure) No date: HOH (hard of hearing)     Comment:  Left Hearing Aid No date: Hypertension No date: Hyperthyroidism No date: On home oxygen therapy     Comment:  uses at night No date: Presence of permanent cardiac pacemaker No date: Sleep apnea     Comment:  No C-PAP   Reproductive/Obstetrics negative OB ROS                             Anesthesia Physical Anesthesia Plan  ASA:  IV  Anesthesia Plan: Spinal   Post-op Pain Management:    Induction:   PONV Risk Score and Plan: 2 and Ondansetron and Propofol infusion  Airway Management Planned:   Additional Equipment:   Intra-op Plan:   Post-operative Plan:   Informed Consent: I have reviewed the patients History and Physical, chart, labs and discussed the procedure including the risks, benefits and alternatives for the proposed anesthesia with the patient or authorized representative who has indicated his/her understanding and acceptance.   Dental Advisory Given  Plan Discussed with: Anesthesiologist, CRNA and Surgeon  Anesthesia Plan Comments:         Anesthesia Quick Evaluation

## 2017-04-08 ENCOUNTER — Encounter: Payer: Self-pay | Admitting: Orthopedic Surgery

## 2017-04-08 LAB — CBC
HEMATOCRIT: 29 % — AB (ref 35.0–47.0)
Hemoglobin: 9.5 g/dL — ABNORMAL LOW (ref 12.0–16.0)
MCH: 30.5 pg (ref 26.0–34.0)
MCHC: 32.8 g/dL (ref 32.0–36.0)
MCV: 93 fL (ref 80.0–100.0)
PLATELETS: 236 10*3/uL (ref 150–440)
RBC: 3.12 MIL/uL — AB (ref 3.80–5.20)
RDW: 16 % — AB (ref 11.5–14.5)
WBC: 8.5 10*3/uL (ref 3.6–11.0)

## 2017-04-08 LAB — GLUCOSE, CAPILLARY
GLUCOSE-CAPILLARY: 234 mg/dL — AB (ref 65–99)
GLUCOSE-CAPILLARY: 197 mg/dL — AB (ref 65–99)
GLUCOSE-CAPILLARY: 161 mg/dL — AB (ref 65–99)
GLUCOSE-CAPILLARY: 194 mg/dL — AB (ref 65–99)
Glucose-Capillary: 189 mg/dL — ABNORMAL HIGH (ref 65–99)

## 2017-04-08 LAB — BASIC METABOLIC PANEL
ANION GAP: 7 (ref 5–15)
BUN: 34 mg/dL — ABNORMAL HIGH (ref 6–20)
CALCIUM: 7.7 mg/dL — AB (ref 8.9–10.3)
CO2: 23 mmol/L (ref 22–32)
Chloride: 111 mmol/L (ref 101–111)
Creatinine, Ser: 0.85 mg/dL (ref 0.44–1.00)
GFR, EST NON AFRICAN AMERICAN: 57 mL/min — AB (ref >60)
GLUCOSE: 236 mg/dL — AB (ref 65–99)
POTASSIUM: 4 mmol/L (ref 3.5–5.1)
Sodium: 141 mmol/L (ref 135–145)

## 2017-04-08 NOTE — Progress Notes (Signed)
Subjective: 1 Day Post-Op Procedure(s) (LRB): ARTHROPLASTY BIPOLAR HIP (HEMIARTHROPLASTY) (Right) Patient reports pain as mild.  Patient is extremely hard of hearing. Patient is well, and has had no acute complaints or problems PT and care management to assist with discharge, will likely need SNF after discharge from hospital. Negative for chest pain and shortness of breath Fever: no Gastrointestinal:Negative for nausea and vomiting  Objective: Vital signs in last 24 hours: Temp:  [97.6 F (36.4 C)-99 F (37.2 C)] 98.7 F (37.1 C) (12/26 0425) Pulse Rate:  [58-64] 63 (12/26 0425) Resp:  [14-23] 18 (12/26 0425) BP: (81-116)/(39-64) 115/64 (12/26 0425) SpO2:  [93 %-100 %] 96 % (12/26 0425)  Intake/Output from previous day:  Intake/Output Summary (Last 24 hours) at 04/08/2017 0803 Last data filed at 04/08/2017 0548 Gross per 24 hour  Intake 2200 ml  Output 1475 ml  Net 725 ml    Intake/Output this shift: No intake/output data recorded.  Labs: Recent Labs    04/06/17 1917 04/08/17 0252  HGB 12.1 9.5*   Recent Labs    04/06/17 1917 04/08/17 0252  WBC 8.9 8.5  RBC 3.95 3.12*  HCT 37.0 29.0*  PLT 225 236   Recent Labs    04/08/17 0252  NA 141  K 4.0  CL 111  CO2 23  BUN 34*  CREATININE 0.85  GLUCOSE 236*  CALCIUM 7.7*   Recent Labs    04/06/17 1917  INR 1.14     EXAM General - Patient is Alert, Appropriate and Oriented Extremity - ABD soft Intact pulses distally Dorsiflexion/Plantar flexion intact Incision: dressing C/D/I No cellulitis present Dressing/Incision - clean, dry, hemovac in place with mild bloody drainage. Motor Function - intact, moving foot and toes well on exam.   Abdomen is soft with normal bowel sounds.  Past Medical History:  Diagnosis Date  . A-fib ([place])   . Arthritis    Osteoarthritis  . Asthma   . Chronic kidney disease    UTI  . COPD (chronic obstructive pulmonary disease) ([place])   . Coronary artery disease   .  Diabetes mellitus without complication ([place])   . GERD (gastroesophageal reflux disease)   . Glaucoma (increased eye pressure)   . HOH (hard of hearing)    Left Hearing Aid  . Hypertension   . Hyperthyroidism   . On home oxygen therapy    uses at night  . Presence of permanent cardiac pacemaker   . Sleep apnea    No C-PAP    Assessment/Plan: 1 Day Post-Op Procedure(s) (LRB): ARTHROPLASTY BIPOLAR HIP (HEMIARTHROPLASTY) (Right) Active Problems:   Hip fx (HCC)  Estimated body mass index is 19.66 kg/m as calculated from the following:   Height as of this encounter: 5\' 2"  (1.575 m).   Weight as of this encounter: 48.8 kg (107 lb 8 oz). Advance diet Up with therapy D/C IV fluids when tolerating po intake.  Labs reviewed.  Acute blood loss anemia, Hg 9.5, will continue to monitor. Up with therapy today. Begin working on BM. Will remove drain tomorrow morning.  DVT Prophylaxis - Lovenox, Foot Pumps and TED hose Weight-Bearing as tolerated to right leg  J. Cameron Proud, PA-C [facility] 04/08/2017, 8:03 AM

## 2017-04-08 NOTE — Progress Notes (Signed)
Health Team SNF authorization has been received, auth # [id number]. Plan is for patient to D/C to [facility] pending medical clearance. [facility] admissions coordinator at [facility] is aware of above. Patient's son [name] is aware of above.   [employer], LCSW [phone]

## 2017-04-08 NOTE — Progress Notes (Signed)
PT is recommending SNF. Clinical Education officer, museum (CSW) presented bed offers to patient's son [name]. He chose [facility]. Health Team case manager is aware of above. [facility] admissions coordinator at [facility] is aware of accepted bed offer.   [employer], LCSW [phone]

## 2017-04-08 NOTE — Progress Notes (Signed)
[place] at [place] NAME: Sarah Dougherty    MR#:  [id number]  DATE OF BIRTH:  11/24/1921  SUBJECTIVE:  CHIEF COMPLAINT:   Chief Complaint  Patient presents with  . Fall   Right hip pain well controlled. No shortness of breath.  Son at bedside Afebrile  REVIEW OF SYSTEMS:    Review of Systems  Constitutional: Positive for malaise/fatigue. Negative for chills and fever.  HENT: Negative for sore throat.   Eyes: Negative for blurred vision, double vision and pain.  Respiratory: Negative for cough, hemoptysis, shortness of breath and wheezing.   Cardiovascular: Negative for chest pain, palpitations, orthopnea and leg swelling.  Gastrointestinal: Negative for abdominal pain, constipation, diarrhea, heartburn, nausea and vomiting.  Genitourinary: Negative for dysuria and hematuria.  Musculoskeletal: Positive for falls and joint pain. Negative for back pain.  Skin: Negative for rash.  Neurological: Negative for sensory change, speech change, focal weakness and headaches.  Endo/Heme/Allergies: Does not bruise/bleed easily.  Psychiatric/Behavioral: Negative for depression. The patient is not nervous/anxious.     DRUG ALLERGIES:   Allergies  Allergen Reactions  . Amoxicillin Other (See Comments)    unknown  . Cephalexin Other (See Comments)    unknown  . Nitrofurantoin Other (See Comments)     Unknown  . Penicillins Other (See Comments)    Other reaction(s): Unknown  . Sulfa Antibiotics Hives and Itching  . Azithromycin Rash    VITALS:  Blood pressure (!) 116/52, pulse 62, temperature (!) 96.8 F (36 C), temperature source Axillary, resp. rate 16, height 5\' 2"  (1.575 m), weight 48.8 kg (107 lb 8 oz), SpO2 96 %.  PHYSICAL EXAMINATION:   Physical Exam  GENERAL:  83 y.o.-year-old patient lying in the bed with no acute distress.  EYES:  No scleral icterus. Extraocular muscles intact.  HEENT: Head atraumatic, normocephalic.  Oropharynx and nasopharynx clear.  NECK:  Supple, no jugular venous distention. No thyroid enlargement, no tenderness.  LUNGS: Normal breath sounds bilaterally, no wheezing, rales, rhonchi. No use of accessory muscles of respiration.  CARDIOVASCULAR: S1, S2 normal. No murmurs, rubs, or gallops.  ABDOMEN: Soft, nontender, nondistended. Bowel sounds present. No organomegaly or mass.  EXTREMITIES: No cyanosis, clubbing or edema b/l.    Right hip drain and dressing NEUROLOGIC: Cranial nerves II through XII are intact. No focal Motor or sensory deficits b/l.   PSYCHIATRIC: The patient is alert and awake SKIN: No obvious rash, lesion, or ulcer.   LABORATORY PANEL:   CBC Recent Labs  Lab 04/08/17 0252  WBC 8.5  HGB 9.5*  HCT 29.0*  PLT 236   ------------------------------------------------------------------------------------------------------------------ Chemistries  Recent Labs  Lab 04/08/17 0252  NA 141  K 4.0  CL 111  CO2 23  GLUCOSE 236*  BUN 34*  CREATININE 0.85  CALCIUM 7.7*   ------------------------------------------------------------------------------------------------------------------  Cardiac Enzymes No results for input(s): TROPONINI in the last 168 hours. ------------------------------------------------------------------------------------------------------------------  RADIOLOGY:  Dg Hip Port Unilat With Pelvis 1v Right  Result Date: 04/07/2017 CLINICAL DATA:  Status post right hip replacement. EXAM: DG HIP (WITH OR WITHOUT PELVIS) 1V PORT RIGHT COMPARISON:  Radiographs of April 03, 2017. FINDINGS: Right hemiarthroplasty is well situated. No fracture or dislocation is noted. Expected postoperative changes are noted in the surrounding soft tissues, including surgical drains. IMPRESSION: Status post right hemiarthroplasty. Electronically Signed   By: Marijo Conception, M.D.   On: 04/07/2017 18:19     ASSESSMENT AND PLAN:   #Acute blood loss anemia  status post  hip surgery. Repeat labs in the morning.  No need for transfusion today.  # Hypoxia due to chronic bronchitis. Needs home O2 at discharge. CXR shows COPD Added incentive spirometer. Continue oxygen  # Acute right femoral neck fracture status post mechanical fall Surgery today Off eliquis Pain meds PT after surgery DVT prophylaxis per surgery  # Chronic A. Fib Stable. Continue amiodarone,Eliquis on hold.  # Diabetes mellitus type 2 SSI  # COPD without exacerbation Breathing treatments as needed  # Right eye infection Cipro eyedrops  All the records are reviewed and case discussed with Care Management/Social Worker Management plans discussed with the patient, family and they are in agreement.  CODE STATUS: FULL CODE  DVT Prophylaxis: SCDs  TOTAL TIME TAKING CARE OF THIS PATIENT: 35 minutes.   POSSIBLE D/C IN 3-4 DAYS, DEPENDING ON CLINICAL CONDITION.  Leia Alf Hill Mackie M.D on 04/08/2017 at 12:41 PM  Between 7am to 6pm - Pager - [phone]  After 6pm go to [email or web address] - password [email or web address]  Office  [phone]  CC: Primary care physician; Perrin Maltese, MD  Note: This dictation was prepared with Dragon dictation along with smaller phrase technology. Any transcriptional errors that result from this process are unintentional.

## 2017-04-08 NOTE — Care Management Note (Signed)
Case Management Note  Patient Details  Name: Sarah Dougherty MRN: [id number] Date of Birth: 17-Jun-1921  Subjective/Objective:                   RNCM met with patient and one of her sons to discuss transition of care. She is from home with another brother "that sleeps a lot and can't hear her if she calls out".  Patient is "supposed to walk with a walker however she don't".  She tries to clean and cook too.  O2 is acute. It is not clear per son what she will need and he appreciates any assistance he can receive. Action/Plan: Home health list provided to son for review. It includes private duty agencies also.  RNCM will follow.   Expected Discharge Date:                  Expected Discharge Plan:     In-House Referral:     Discharge planning Services  CM Consult  Post Acute Care Choice:  Home Health Choice offered to:  Patient, Adult Children  DME Arranged:    DME Agency:     HH Arranged:    [place] Agency:     Status of Service:  In process, will continue to follow  If discussed at Long Length of Stay Meetings, dates discussed:    Additional Comments:  Marshell Garfinkel, RN 04/08/2017, 9:32 AM

## 2017-04-08 NOTE — Anesthesia Postprocedure Evaluation (Signed)
Anesthesia Post Note  Patient: Sarah Dougherty  Procedure(s) Performed: ARTHROPLASTY BIPOLAR HIP (HEMIARTHROPLASTY) (Right Hip)  Patient location during evaluation: Nursing Unit Anesthesia Type: Spinal Level of consciousness: awake and alert and awake Pain management: pain level controlled Vital Signs Assessment: post-procedure vital signs reviewed and stable Respiratory status: spontaneous breathing and nonlabored ventilation Cardiovascular status: stable Postop Assessment: no backache, adequate PO intake, no apparent nausea or vomiting and patient able to bend at knees Anesthetic complications: no     Last Vitals:  Vitals:   04/08/17 0425 04/08/17 0835  BP: 115/64 (!) 125/54  Pulse: 63 60  Resp: 18 16  Temp: 37.1 C   SpO2: 96% 98%    Last Pain:  Vitals:   04/08/17 0835  TempSrc:   PainSc: 0-No pain                 Lanora Manis

## 2017-04-08 NOTE — Progress Notes (Signed)
Physical Therapy Treatment Patient Details Name: Sarah Dougherty MRN: [id number] DOB: 07-Apr-1922 Today's Date: 04/08/2017    History of Present Illness Pt is a 83 y.o. female s/p mechanical fall sustaining mildly displaced R subcapital femoral neck fx; recent ED visit for fall 03/24/17.  Surgery cancelled 04/05/17 d/t hypoxia.  Pt s/p R hip hemiarthroplasty 04/07/17.  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    PT Comments    Pt still requiring mod assist to stand with RW (vc's/tactile cues required for hand placement) but pt able to progress to ambulating 12 feet with RW (chair follow; limited d/t pt fatigue).  Improved WB'ing tolerance noted to R LE but antalgic gait still present.  Will continue to progress pt with strengthening and progressive functional mobility per pt tolerance during hospital stay.    Follow Up Recommendations  SNF     Equipment Recommendations  Rolling walker with 5" wheels    Recommendations for Other Services OT consult     Precautions / Restrictions Precautions Precautions: Fall;Posterior Hip Precaution Booklet Issued: Yes (comment) Restrictions Weight Bearing Restrictions: Yes RLE Weight Bearing: Weight bearing as tolerated    Mobility  Bed Mobility Overal bed mobility: Needs Assistance Bed Mobility: Sit to Supine     Sit to supine: Max assist   General bed mobility comments: assist for trunk and B LE's sit to supine; vc's and tactile cues required for technique and posterior hip precautions  Transfers Overall transfer level: Needs assistance Equipment used: Rolling walker (2 wheeled) Transfers: Sit to/from Stand Sit to Stand: Mod assist  Stand step turn recliner to bed: mod assist with RW       General transfer comment: assist to initiate and come to full stand with RW x2 trials; vc's and tactile cues required for UE and LE  positioning  Ambulation/Gait Ambulation/Gait assistance: Min assist;Mod assist;+2 physical assistance;+2 safety/equipment Ambulation Distance (Feet): 12 Feet Assistive device: Rolling walker (2 wheeled)   Gait velocity: decreased   General Gait Details: antalgic; decreased stance time R LE; vc's to increased UE support through RW; decreased B foot clearance and step length   Stairs            Wheelchair Mobility    Modified Rankin (Stroke Patients Only)       Balance Overall balance assessment: Needs assistance Sitting-balance support: Bilateral upper extremity supported;Feet supported Sitting balance-Leahy Scale: Fair Sitting balance - Comments: static sitting steady with SBA     Standing balance-Leahy Scale: Poor Standing balance comment: static standing with B UE support on RW                            Cognition Arousal/Alertness: Awake/alert Behavior During Therapy: WFL for tasks assessed/performed Overall Cognitive Status: Within Functional Limits for tasks assessed                                        Exercises     General Comments General comments (skin integrity, edema, etc.): Pt resting in chair upon PT arrival with hemovac in place.  Pt's 2 sons and daughter-in-law present.  Pt agreeable to PT session.      Pertinent Vitals/Pain Pain Assessment: Faces Faces Pain Scale: Hurts little more(4/10 with mobiklity; 2/10 at rest) Pain Location: R hip Pain Descriptors /  Indicators: Guarding;Grimacing Pain Intervention(s): Limited activity within patient's tolerance;Monitored during session;Premedicated before session;Repositioned  Vitals (HR and O2 on 1 L O2 via nasal cannula) stable and WFL throughout treatment session.    Home Living Family/patient expects to be discharged to:: Private residence Living Arrangements: Children(Pt's son) Available Help at Discharge: Family Type of Home: House Home Access: Stairs to enter    Home Layout: One level Home Equipment: Environmental consultant - 2 wheels;Walker - 4 wheels      Prior Function Level of Independence: Needs assistance  Gait / Transfers Assistance Needed: Pt reports ambulating sometimes with walker. ADL's / Homemaking Assistance Needed: Per chart pt tries to clean and cook. Comments: Pt reports 24/7 assist from son   PT Goals (current goals can now be found in the care plan section) Acute Rehab PT Goals Patient Stated Goal: to be able to walk again PT Goal Formulation: With patient Time For Goal Achievement: 04/22/17 Potential to Achieve Goals: Good Progress towards PT goals: Progressing toward goals    Frequency    BID      PT Plan Current plan remains appropriate    Co-evaluation              AM-PAC PT "6 Clicks" Daily Activity  Outcome Measure  Difficulty turning over in bed (including adjusting bedclothes, sheets and blankets)?: Unable Difficulty moving from lying on back to sitting on the side of the bed? : Unable Difficulty sitting down on and standing up from a chair with arms (e.g., wheelchair, bedside commode, etc,.)?: Unable Help needed moving to and from a bed to chair (including a wheelchair)?: A Lot Help needed walking in hospital room?: Total Help needed climbing 3-5 steps with a railing? : Total 6 Click Score: 7    End of Session Equipment Utilized During Treatment: Gait belt;Oxygen(1 L O2 via nasal cannula) Activity Tolerance: Patient limited by pain;Patient limited by fatigue Patient left: in bed;with call bell/phone within reach;with bed alarm set;with family/visitor present;with SCD's reapplied(pillows placed between pt's knees for posterior hip precautions; B heels elevated via towel rolls) Nurse Communication: Mobility status;Precautions;Weight bearing status;Other (comment)(Ice pack leaked onto pt's gown and needing to be changed (notified nursing tech)) PT Visit Diagnosis: Other abnormalities of gait and mobility  (R26.89);Muscle weakness (generalized) (M62.81);History of falling (Z91.81);Pain Pain - Right/Left: Right Pain - part of body: Hip     Time: 6195-0932 PT Time Calculation (min) (ACUTE ONLY): 23 min  Charges:  $Gait Training: 8-22 mins $Therapeutic Activity: 8-22 mins                    G Codes:  Functional Assessment Tool Used: AM-PAC 6 Clicks Basic Mobility Functional Limitation: Mobility: Walking and moving around Mobility: Walking and Moving Around Current Status (I7124): At least 80 percent but less than 100 percent impaired, limited or restricted Mobility: Walking and Moving Around Goal Status [phone]): At least 1 percent but less than 20 percent impaired, limited or restricted    Leitha Bleak, PT 04/08/17, 2:11 PM [phone]

## 2017-04-08 NOTE — Care Management (Signed)
RNCM consult received. PT is recommending SNF at this time and per CSW note patient/family has selected [facility]. No current RNCM needs. CSW will follow.

## 2017-04-08 NOTE — Evaluation (Signed)
Physical Therapy Evaluation Patient Details Name: Sarah Dougherty MRN: [id number] DOB: 20-Dec-1921 Today's Date: 04/08/2017   History of Present Illness  Pt is a 83 y.o. female s/p mechanical fall sustaining mildly displaced R subcapital femoral neck fx; recent ED visit for fall 03/24/17.  Surgery cancelled 04/05/17 d/t hypoxia.  Pt s/p R hip hemiarthroplasty 04/07/17.  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  Clinical Impression  Prior to hospital admission, pt was ambulatory (sometimes used walker).  Pt lives with her son in 1 level home with steps to enter.  Currently pt is max assist supine to sit; mod assist to stand with RW; and mod assist to take a few steps with RW bed to recliner (limited distance ambulating d/t R hip pain).  Pt would benefit from skilled PT to address noted impairments and functional limitations (see below for any additional details).  Upon hospital discharge, recommend pt discharge to [place].    Follow Up Recommendations SNF    Equipment Recommendations  Rolling walker with 5" wheels    Recommendations for Other Services OT consult     Precautions / Restrictions Precautions Precautions: Fall;Posterior Hip Precaution Booklet Issued: Yes (comment) Restrictions Weight Bearing Restrictions: Yes RLE Weight Bearing: Weight bearing as tolerated      Mobility  Bed Mobility Overal bed mobility: Needs Assistance Bed Mobility: Supine to Sit     Supine to sit: Max assist;HOB elevated     General bed mobility comments: assist for trunk and B LE's supine to sit; vc's and tactile cues required for technique and posterior hip precautions  Transfers Overall transfer level: Needs assistance Equipment used: Rolling walker (2 wheeled) Transfers: Sit to/from Stand Sit to Stand: Mod assist         General transfer comment: assist to initiate and come to full stand with RW; vc's and  tactile cues required for UE and LE positioning  Ambulation/Gait Ambulation/Gait assistance: Mod assist Ambulation Distance (Feet): 3 Feet(bed to recliner) Assistive device: Rolling walker (2 wheeled)   Gait velocity: decreased   General Gait Details: antalgic; decreased stance time R LE; vc's to increased UE support through RW; decreased B foot clearance and step length  Stairs            Wheelchair Mobility    Modified Rankin (Stroke Patients Only)       Balance Overall balance assessment: Needs assistance Sitting-balance support: Bilateral upper extremity supported;Feet supported Sitting balance-Leahy Scale: Fair Sitting balance - Comments: static sitting steady with SBA                                     Pertinent Vitals/Pain Pain Assessment: Faces Faces Pain Scale: Hurts even more(6/10 with mobility; 2/10 at rest) Pain Location: R hip Pain Descriptors / Indicators: Guarding;Grimacing Pain Intervention(s): Limited activity within patient's tolerance;Monitored during session;Premedicated before session;Repositioned;Ice applied  Vitals stable and WFL throughout treatment session.    Home Living Family/patient expects to be discharged to:: Private residence Living Arrangements: Children(Pt's son) Available Help at Discharge: Family Type of Home: House Home Access: Stairs to enter   [employer] of Steps: 2(railing in the middle of the steps) Home Layout: One level Home Equipment: Environmental consultant - 2 wheels;Walker - 4 wheels      Prior Function Level of Independence: Needs assistance   Gait / Transfers Assistance Needed: Pt reports  ambulating sometimes with walker.  ADL's / Homemaking Assistance Needed: Per chart pt tries to clean and cook.  Comments: Pt reports 24/7 assist from son     Hand Dominance        Extremity/Trunk Assessment   Upper Extremity Assessment Upper Extremity Assessment: Generalized weakness    Lower  Extremity Assessment Lower Extremity Assessment: Generalized weakness(at least 3/5 AROM R hip flexion to 80 degrees; at least 3+/5 R knee flexion/extension and DF)    Cervical / Trunk Assessment Cervical / Trunk Assessment: Normal  Communication   Communication: HOH  Cognition Arousal/Alertness: Awake/alert Behavior During Therapy: WFL for tasks assessed/performed Overall Cognitive Status: Within Functional Limits for tasks assessed                                        General Comments General comments (skin integrity, edema, etc.): Pt resting in bed upon PT arrival with hemovac in place.  Nursing cleared pt for participation in physical therapy.  Pt agreeable to PT session.    Exercises Total Joint Exercises Ankle Circles/Pumps: AROM;Strengthening;Both;10 reps;Supine Short Arc Quad: AROM;Strengthening;Both;10 reps;Supine Heel Slides: AAROM;Strengthening;Both;10 reps;Supine Hip ABduction/ADduction: AAROM;Strengthening;Both;10 reps;Supine   Assessment/Plan    PT Assessment Patient needs continued PT services  PT Problem List Decreased strength;Decreased activity tolerance;Decreased balance;Decreased mobility;Decreased knowledge of use of DME;Decreased knowledge of precautions;Pain       PT Treatment Interventions DME instruction;Gait training;Stair training;Functional mobility training;Therapeutic activities;Therapeutic exercise;Balance training;Patient/family education    PT Goals (Current goals can be found in the Care Plan section)  Acute Rehab PT Goals Patient Stated Goal: to be able to walk again PT Goal Formulation: With patient Time For Goal Achievement: 04/22/17 Potential to Achieve Goals: Good    Frequency BID   Barriers to discharge Decreased caregiver support Level of assist    Co-evaluation               AM-PAC PT "6 Clicks" Daily Activity  Outcome Measure Difficulty turning over in bed (including adjusting bedclothes, sheets and  blankets)?: Unable Difficulty moving from lying on back to sitting on the side of the bed? : Unable Difficulty sitting down on and standing up from a chair with arms (e.g., wheelchair, bedside commode, etc,.)?: Unable Help needed moving to and from a bed to chair (including a wheelchair)?: A Lot Help needed walking in hospital room?: Total Help needed climbing 3-5 steps with a railing? : Total 6 Click Score: 7    End of Session Equipment Utilized During Treatment: Gait belt;Oxygen(1 L via nasal cannula) Activity Tolerance: Patient limited by pain Patient left: in chair;with call bell/phone within reach;with chair alarm set;with nursing/sitter in room;with SCD's reapplied(pillows placed between pt's knees for posterior hip precautions; B heels elevated via towel rolls; ice pack to R hip) Nurse Communication: Mobility status;Precautions;Weight bearing status;Other (comment)(Pt's pain status) PT Visit Diagnosis: Other abnormalities of gait and mobility (R26.89);Muscle weakness (generalized) (M62.81);History of falling (Z91.81);Pain Pain - Right/Left: Right Pain - part of body: Hip    Time: 5573-2202 PT Time Calculation (min) (ACUTE ONLY): 28 min   Charges:   PT Evaluation $PT Eval Low Complexity: 1 Low PT Treatments $Therapeutic Exercise: 8-22 mins   PT G Codes:   PT G-Codes **NOT FOR INPATIENT CLASS** Functional Assessment Tool Used: AM-PAC 6 Clicks Basic Mobility Functional Limitation: Mobility: Walking and moving around Mobility: Walking and Moving Around Current Status (R4270): At least 80 percent  but less than 100 percent impaired, limited or restricted Mobility: Walking and Moving Around Goal Status [phone]): At least 1 percent but less than 20 percent impaired, limited or restricted    Leitha Bleak, PT 04/08/17, 12:23 PM [phone]

## 2017-04-08 NOTE — Evaluation (Signed)
Occupational Therapy Evaluation Patient Details Name: Sarah Dougherty MRN: [id number] DOB: 12/25/1921 Today's Date: 04/08/2017    History of Present Illness Pt is a 83 y.o. female s/p mechanical fall sustaining mildly displaced R subcapital femoral neck fx; recent ED visit for fall 03/24/17.  Surgery cancelled 04/05/17 d/t hypoxia.  Pt s/p R hip hemiarthroplasty 04/07/17.  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   Clinical Impression   OT orders received and patient seen for OT evaluation this date.  Patient lives at home with her son in a one story home with a ramp to enter. Patient was previously independent with basic self care, did the cooking, cleaning, medication management and laundry with modified independence.  Pt fell at home and suffered a right femoral neck fracture and underwent surgical intervention for a right hip hemiarthroplasty on 04/07/2017.  She now has posterior hip precautions and is WBAT. She currently presents with muscle weakness, decreased transfers, decreased functional mobility, decreased ability to perform self care tasks and pain.  Patient will benefit from skilled OT to maximize safety and independence in daily tasks. She will likely benefit from short term rehab prior to returning home.     Follow Up Recommendations  SNF    Equipment Recommendations       Recommendations for Other Services       Precautions / Restrictions Precautions Precautions: Fall;Posterior Hip Precaution Booklet Issued: Yes (comment) Restrictions Weight Bearing Restrictions: Yes RLE Weight Bearing: Weight bearing as tolerated      Mobility Bed Mobility Overal bed mobility: Needs Assistance Bed Mobility: Sit to Supine     Supine to sit: Max assist;HOB elevated Sit to supine: Max assist   General bed mobility comments: assist for trunk and B LE's sit to supine; vc's and tactile cues required for  technique and posterior hip precautions  Transfers Overall transfer level: Needs assistance Equipment used: Rolling walker (2 wheeled) Transfers: Sit to/from Stand Sit to Stand: Mod assist         General transfer comment: sit to stand per PT this date.     Balance Overall balance assessment: Needs assistance Sitting-balance support: Bilateral upper extremity supported;Feet supported Sitting balance-Leahy Scale: Fair Sitting balance - Comments: static sitting steady with SBA     Standing balance-Leahy Scale: Poor Standing balance comment: static standing with B UE support on RW                           ADL either performed or assessed with clinical judgement   ADL Overall ADL's : Needs assistance/impaired Eating/Feeding: Modified independent   Grooming: Bed level;Set up   Upper Body Bathing: Minimal assistance   Lower Body Bathing: Maximal assistance   Upper Body Dressing : Set up   Lower Body Dressing: Total assistance Lower Body Dressing Details (indicate cue type and reason): has hip precautions    Toilet Transfer Details (indicate cue type and reason): to be assessed next date, mod assist with PT transfer earlier and just back to bed. Anticipate moderate assist per clinical judgment           General ADL Comments: Patient requiring increased assistance with all self care tasks s/p surgical intervention, now has posterior hip precautions and is WBAT.     Vision Baseline Vision/History: Wears glasses Wears Glasses: Reading only Patient Visual Report: No change from baseline  Perception     Praxis      Pertinent Vitals/Pain Pain Assessment: Faces Pain Score: 4  Faces Pain Scale: Hurts little more Pain Location: R hip Pain Descriptors / Indicators: Guarding;Grimacing Pain Intervention(s): Limited activity within patient's tolerance;Repositioned;Monitored during session     Hand Dominance Right   Extremity/Trunk Assessment Upper  Extremity Assessment Upper Extremity Assessment: Generalized weakness   Lower Extremity Assessment Lower Extremity Assessment: Generalized weakness   Cervical / Trunk Assessment Cervical / Trunk Assessment: Normal   Communication Communication Communication: HOH   Cognition Arousal/Alertness: Awake/alert Behavior During Therapy: WFL for tasks assessed/performed Overall Cognitive Status: Within Functional Limits for tasks assessed                                 General Comments: HOH, son present to help with details of home set up and past history.   General Comments  Pt resting in chair upon PT arrival with hemovac in place.  Pt's 2 sons and daughter-in-law present.    Exercises Total Joint Exercises Ankle Circles/Pumps: AROM;Strengthening;Both;10 reps;Supine Short Arc Quad: AROM;Strengthening;Both;10 reps;Supine Heel Slides: AAROM;Strengthening;Both;10 reps;Supine Hip ABduction/ADduction: AAROM;Strengthening;Both;10 reps;Supine   Shoulder Instructions      Home Living Family/patient expects to be discharged to:: Private residence Living Arrangements: Children Available Help at Discharge: Family Type of Home: House Home Access: Stairs to enter [employer] of Steps: 2   Home Layout: One level     Bathroom Shower/Tub: Tub/shower unit         Home Equipment: Environmental consultant - 2 wheels;Walker - 4 wheels;Cane - single point          Prior Functioning/Environment Level of Independence: Needs assistance  Gait / Transfers Assistance Needed: Pt reports ambulating sometimes with walker or rollator.  Son reports 2 recent falls.  ADL's / Homemaking Assistance Needed: Son reports patient was independent with basic self care tasks, would do cooking and some cleaning as well as laundry.  She was independent with her medication management.    Comments: Patient lives with her son who provides 24 hour care.  Patient was in the kitchen when she fell, son assisted  her up and called EMS.  Patient is hard of hearing and usually wears hearing aids, son to bring in tomorrow. She also has an artificial eye on the right .        OT Problem List: Decreased strength;Decreased knowledge of precautions;Impaired balance (sitting and/or standing);Pain;Decreased activity tolerance;Decreased knowledge of use of DME or AE      OT Treatment/Interventions: Self-care/ADL training;DME and/or AE instruction;Therapeutic activities;Balance training;Therapeutic exercise;Patient/family education    OT Goals(Current goals can be found in the care plan section) Acute Rehab OT Goals Patient Stated Goal: to be able to take care of herself like she did before OT Goal Formulation: With patient/family Time For Goal Achievement: 04/18/17 Potential to Achieve Goals: Good ADL Goals Pt Will Perform Lower Body Dressing: (P) with min assist;with adaptive equipment Pt Will Transfer to Toilet: (P) with min assist  OT Frequency: Min 1X/week   Barriers to D/C:            Co-evaluation              AM-PAC PT "6 Clicks" Daily Activity     Outcome Measure Help from another person eating meals?: None Help from another person taking care of personal grooming?: A Little Help from another person toileting, which includes using toliet, bedpan, or  urinal?: A Lot Help from another person bathing (including washing, rinsing, drying)?: Total Help from another person to put on and taking off regular upper body clothing?: A Little Help from another person to put on and taking off regular lower body clothing?: Total 6 Click Score: 14   End of Session    Activity Tolerance: Patient tolerated treatment well Patient left: in bed;with bed alarm set;with family/visitor present  OT Visit Diagnosis: History of falling (Z91.81);Repeated falls (R29.6);Muscle weakness (generalized) (M62.81);Pain Pain - Right/Left: Right Pain - part of body: Hip                Time: 1415-1436 OT Time  Calculation (min): 21 min Charges:  OT General Charges $OT Visit: 1 Visit OT Evaluation $OT Eval Low Complexity: 1 Low G-Codes: OT G-codes **NOT FOR INPATIENT CLASS** Functional Assessment Tool Used: AM-PAC 6 Clicks Daily Activity Functional Limitation: Self care Self Care Current Status (B3532): At least 40 percent but less than 60 percent impaired, limited or restricted Self Care Goal Status (D9242): At least 20 percent but less than 40 percent impaired, limited or restricted   Amy T Lovett, OTR/L, CLT   Lovett,Amy 04/08/2017, 2:52 PM

## 2017-04-09 DIAGNOSIS — I2510 Atherosclerotic heart disease of native coronary artery without angina pectoris: Secondary | ICD-10-CM | POA: Diagnosis not present

## 2017-04-09 DIAGNOSIS — H409 Unspecified glaucoma: Secondary | ICD-10-CM | POA: Diagnosis not present

## 2017-04-09 DIAGNOSIS — K219 Gastro-esophageal reflux disease without esophagitis: Secondary | ICD-10-CM | POA: Diagnosis not present

## 2017-04-09 DIAGNOSIS — S72001D Fracture of unspecified part of neck of right femur, subsequent encounter for closed fracture with routine healing: Secondary | ICD-10-CM | POA: Diagnosis not present

## 2017-04-09 DIAGNOSIS — I482 Chronic atrial fibrillation: Secondary | ICD-10-CM | POA: Diagnosis not present

## 2017-04-09 DIAGNOSIS — D62 Acute posthemorrhagic anemia: Secondary | ICD-10-CM | POA: Diagnosis not present

## 2017-04-09 DIAGNOSIS — Z950 Presence of cardiac pacemaker: Secondary | ICD-10-CM | POA: Diagnosis not present

## 2017-04-09 DIAGNOSIS — J9611 Chronic respiratory failure with hypoxia: Secondary | ICD-10-CM | POA: Diagnosis not present

## 2017-04-09 DIAGNOSIS — J449 Chronic obstructive pulmonary disease, unspecified: Secondary | ICD-10-CM | POA: Diagnosis not present

## 2017-04-09 DIAGNOSIS — E119 Type 2 diabetes mellitus without complications: Secondary | ICD-10-CM | POA: Diagnosis not present

## 2017-04-09 DIAGNOSIS — M8000XD Age-related osteoporosis with current pathological fracture, unspecified site, subsequent encounter for fracture with routine healing: Secondary | ICD-10-CM | POA: Diagnosis not present

## 2017-04-09 DIAGNOSIS — M1990 Unspecified osteoarthritis, unspecified site: Secondary | ICD-10-CM | POA: Diagnosis not present

## 2017-04-09 DIAGNOSIS — I4891 Unspecified atrial fibrillation: Secondary | ICD-10-CM | POA: Diagnosis not present

## 2017-04-09 DIAGNOSIS — S72001A Fracture of unspecified part of neck of right femur, initial encounter for closed fracture: Secondary | ICD-10-CM | POA: Diagnosis not present

## 2017-04-09 DIAGNOSIS — Z9981 Dependence on supplemental oxygen: Secondary | ICD-10-CM | POA: Diagnosis not present

## 2017-04-09 DIAGNOSIS — Z96641 Presence of right artificial hip joint: Secondary | ICD-10-CM | POA: Diagnosis not present

## 2017-04-09 DIAGNOSIS — I10 Essential (primary) hypertension: Secondary | ICD-10-CM | POA: Diagnosis not present

## 2017-04-09 DIAGNOSIS — W19XXXD Unspecified fall, subsequent encounter: Secondary | ICD-10-CM | POA: Diagnosis not present

## 2017-04-09 DIAGNOSIS — Z7984 Long term (current) use of oral hypoglycemic drugs: Secondary | ICD-10-CM | POA: Diagnosis not present

## 2017-04-09 DIAGNOSIS — E785 Hyperlipidemia, unspecified: Secondary | ICD-10-CM | POA: Diagnosis not present

## 2017-04-09 LAB — BASIC METABOLIC PANEL
ANION GAP: 5 (ref 5–15)
BUN: 28 mg/dL — ABNORMAL HIGH (ref 6–20)
CALCIUM: 8.1 mg/dL — AB (ref 8.9–10.3)
CO2: 27 mmol/L (ref 22–32)
CREATININE: 0.7 mg/dL (ref 0.44–1.00)
Chloride: 110 mmol/L (ref 101–111)
Glucose, Bld: 163 mg/dL — ABNORMAL HIGH (ref 65–99)
Potassium: 3.7 mmol/L (ref 3.5–5.1)
SODIUM: 142 mmol/L (ref 135–145)

## 2017-04-09 LAB — CBC
HCT: 31.4 % — ABNORMAL LOW (ref 35.0–47.0)
Hemoglobin: 10.3 g/dL — ABNORMAL LOW (ref 12.0–16.0)
MCH: 30.3 pg (ref 26.0–34.0)
MCHC: 32.9 g/dL (ref 32.0–36.0)
MCV: 92 fL (ref 80.0–100.0)
PLATELETS: 261 10*3/uL (ref 150–440)
RBC: 3.42 MIL/uL — ABNORMAL LOW (ref 3.80–5.20)
RDW: 15.7 % — AB (ref 11.5–14.5)
WBC: 11 10*3/uL (ref 3.6–11.0)

## 2017-04-09 LAB — GLUCOSE, CAPILLARY
GLUCOSE-CAPILLARY: 172 mg/dL — AB (ref 65–99)
Glucose-Capillary: 134 mg/dL — ABNORMAL HIGH (ref 65–99)
Glucose-Capillary: 170 mg/dL — ABNORMAL HIGH (ref 65–99)

## 2017-04-09 LAB — SURGICAL PATHOLOGY

## 2017-04-09 MED ORDER — APIXABAN 2.5 MG PO TABS: 2.5000 mg | ORAL_TABLET | Freq: Two times a day (BID) | ORAL | Status: DC

## 2017-04-09 MED ORDER — OXYCODONE HCL 5 MG PO TABS: 2.5000 mg | ORAL_TABLET | ORAL | 0 refills | Status: DC | PRN

## 2017-04-09 NOTE — Progress Notes (Signed)
Patient is medically stable for D/C to [facility] today. Per [facility] admissions coordinator at [facility] patient can come today to room 408. [facility] authorization has been received. RN will call report and arrange EMS for transport. Clinical Education officer, museum (CSW) sent D/C orders to [facility] via [place]. Patient is aware of above. Patient's 2 sons [name] and [name] are at bedside and aware of above. Please reconsult if future social work needs arise. CSW signing off.   [employer], LCSW [phone]

## 2017-04-09 NOTE — Progress Notes (Signed)
Physical Therapy Treatment Patient Details Name: Sarah Dougherty MRN: [id number] DOB: November 28, 1921 Today's Date: 04/09/2017    History of Present Illness Pt is a 83 y.o. female s/p mechanical fall sustaining mildly displaced R subcapital femoral neck fx; recent ED visit for fall 03/24/17.  Surgery cancelled 04/05/17 d/t hypoxia.  Pt s/p R hip hemiarthroplasty 04/07/17.  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    PT Comments    Participated in exercises as described below.  Pt voiced she is ready to get out of bed this am.  To edge of bed with max assist.  Verbal and tactile cues for hand placements and overall stiff movements.  Voiced pain with transition.  Once sitting, able to sit with min assist.  Unsafe to be left unattended in sitting.  Stood with mod a x 2 with lean to right.  Time given in sitting and static standing before gait as she stated she was dizzy but relieved with time.  Son confirms this was normal for pt prior to admit.  She ambulated 5' x 1 and 8' x 1 with walker and mod a x 2.  Decreased step height and length with right/posterior lean during standing and gait.   Pt remained in chair after session.   Follow Up Recommendations  SNF     Equipment Recommendations  Rolling walker with 5" wheels    Recommendations for Other Services       Precautions / Restrictions Precautions Precautions: Fall;Posterior Hip Restrictions Weight Bearing Restrictions: Yes RLE Weight Bearing: Weight bearing as tolerated    Mobility  Bed Mobility Overal bed mobility: Needs Assistance Bed Mobility: Sit to Supine     Supine to sit: Max assist;HOB elevated     General bed mobility comments: assist for trunk and B LE's sit to supine; vc's and tactile cues required for technique and posterior hip precautions  Transfers Overall transfer level: Needs assistance Equipment used: Rolling walker (2  wheeled) Transfers: Sit to/from Stand Sit to Stand: Mod assist;+2 physical assistance         General transfer comment: Increased assist this session.  Nursing/family reports pt slept poorly  Ambulation/Gait Ambulation/Gait assistance: Mod assist;+2 physical assistance;+2 safety/equipment   Assistive device: Rolling walker (2 wheeled) Gait Pattern/deviations: Step-to pattern;Decreased weight shift to right Gait velocity: decreased Gait velocity interpretation: <1.8 ft/sec, indicative of risk for recurrent falls General Gait Details: antalgic; decreased stance time R LE; vc's to increased UE support through RW; decreased B foot clearance and step length   Stairs            Wheelchair Mobility    Modified Rankin (Stroke Patients Only)       Balance Overall balance assessment: Needs assistance   Sitting balance-Leahy Scale: Fair     Standing balance support: Bilateral upper extremity supported Standing balance-Leahy Scale: Poor Standing balance comment: static standing with B UE support on RW                            Cognition Arousal/Alertness: Awake/alert Behavior During Therapy: WFL for tasks assessed/performed Overall Cognitive Status: Within Functional Limits for tasks assessed                                        Exercises Total Joint Exercises Ankle  Circles/Pumps: AROM;Strengthening;Both;10 reps;Supine Short Arc Quad: AROM;Strengthening;Both;10 reps;Supine Heel Slides: AAROM;Strengthening;Both;10 reps;Supine Hip ABduction/ADduction: AAROM;Strengthening;Both;10 reps;Supine    General Comments        Pertinent Vitals/Pain Pain Assessment: Faces Faces Pain Scale: Hurts little more Pain Location: R hip Pain Descriptors / Indicators: Guarding;Grimacing Pain Intervention(s): Limited activity within patient's tolerance;Monitored during session    Home Living                      Prior Function             PT Goals (current goals can now be found in the care plan section) Progress towards PT goals: Progressing toward goals    Frequency    BID      PT Plan Current plan remains appropriate    Co-evaluation              AM-PAC PT "6 Clicks" Daily Activity  Outcome Measure  Difficulty turning over in bed (including adjusting bedclothes, sheets and blankets)?: Unable Difficulty moving from lying on back to sitting on the side of the bed? : Unable Difficulty sitting down on and standing up from a chair with arms (e.g., wheelchair, bedside commode, etc,.)?: Unable Help needed moving to and from a bed to chair (including a wheelchair)?: A Lot Help needed walking in hospital room?: Total Help needed climbing 3-5 steps with a railing? : Total 6 Click Score: 7    End of Session Equipment Utilized During Treatment: Gait belt;Oxygen Activity Tolerance: Patient limited by pain;Patient limited by fatigue Patient left: in chair;with chair alarm set;with call bell/phone within reach;with family/visitor present   Pain - Right/Left: Right Pain - part of body: Hip     Time: 4709-6283 PT Time Calculation (min) (ACUTE ONLY): 16 min  Charges:  $Gait Training: 8-22 mins                    G Codes:      Chesley Noon, PTA 04/09/17, 10:17 AM

## 2017-04-09 NOTE — Progress Notes (Signed)
Subjective: 2 Days Post-Op Procedure(s) (LRB): ARTHROPLASTY BIPOLAR HIP (HEMIARTHROPLASTY) (Right) Patient reports pain as moderate.  Patient is extremely hard of hearing. Patient is well, and has had no acute complaints or problems PT and care management to assist with discharge, will likely need SNF after discharge from hospital. Negative for chest pain and shortness of breath Fever: no Gastrointestinal:Negative for nausea and vomiting  Objective: Vital signs in last 24 hours: Temp:  [96.8 F (36 C)-98.4 F (36.9 C)] 98.4 F (36.9 C) (12/27 0246) Pulse Rate:  [58-62] 58 (12/27 0246) Resp:  [16-19] 19 (12/27 0246) BP: (99-129)/(49-61) 129/61 (12/27 0246) SpO2:  [90 %-98 %] 94 % (12/27 0246)  Intake/Output from previous day:  Intake/Output Summary (Last 24 hours) at 04/09/2017 0710 Last data filed at 04/09/2017 0059 Gross per 24 hour  Intake 1255 ml  Output 600 ml  Net 655 ml    Intake/Output this shift: No intake/output data recorded.  Labs: Recent Labs    04/06/17 1917 04/08/17 0252 04/09/17 0421  HGB 12.1 9.5* 10.3*   Recent Labs    04/08/17 0252 04/09/17 0421  WBC 8.5 11.0  RBC 3.12* 3.42*  HCT 29.0* 31.4*  PLT 236 261   Recent Labs    04/08/17 0252 04/09/17 0421  NA 141 142  K 4.0 3.7  CL 111 110  CO2 23 27  BUN 34* 28*  CREATININE 0.85 0.70  GLUCOSE 236* 163*  CALCIUM 7.7* 8.1*   Recent Labs    04/06/17 1917  INR 1.14     EXAM General - Patient is Alert, Appropriate and Oriented Extremity - ABD soft Intact pulses distally Dorsiflexion/Plantar flexion intact Incision: dressing C/D/I No cellulitis present Dressing/Incision - clean, dry.  Hemovac removed this morning. 4x4 with tegaderm applied to the pin site. Motor Function - intact, moving foot and toes well on exam.   Abdomen is soft with normal bowel sounds.  Past Medical History:  Diagnosis Date  . A-fib ([place])   . Arthritis    Osteoarthritis  . Asthma   . Chronic kidney  disease    UTI  . COPD (chronic obstructive pulmonary disease) ([place])   . Coronary artery disease   . Diabetes mellitus without complication ([place])   . GERD (gastroesophageal reflux disease)   . Glaucoma (increased eye pressure)   . HOH (hard of hearing)    Left Hearing Aid  . Hypertension   . Hyperthyroidism   . On home oxygen therapy    uses at night  . Presence of permanent cardiac pacemaker   . Sleep apnea    No C-PAP    Assessment/Plan: 2 Days Post-Op Procedure(s) (LRB): ARTHROPLASTY BIPOLAR HIP (HEMIARTHROPLASTY) (Right) Active Problems:   Hip fx (HCC)  Estimated body mass index is 19.66 kg/m as calculated from the following:   Height as of this encounter: 5\' 2"  (1.575 m).   Weight as of this encounter: 48.8 kg (107 lb 8 oz). Advance diet Up with therapy D/C IV fluids when tolerating po intake.  Labs reviewed.  Acute blood loss anemia, Hg 10.3, will continue to monitor. Up with therapy today. Was able to ambulate 12 feet yesterday. Begin working on BM. Move on to fleet enema if needed today. Plan for discharge when medically appropriate, maybe tomorrow.  DVT Prophylaxis - Lovenox, Foot Pumps and TED hose Weight-Bearing as tolerated to right leg  J. Cameron Proud, PA-C [facility] 04/09/2017, 7:10 AM

## 2017-04-09 NOTE — Plan of Care (Signed)
  Completed/Met Education: Knowledge of General Education information will improve 04/09/2017 1716 - Completed/Met by Milderd Meager, RN Health Behavior/Discharge Planning: Ability to manage health-related needs will improve 04/09/2017 1716 - Completed/Met by Milderd Meager, RN Clinical Measurements: Ability to maintain clinical measurements within normal limits will improve 04/09/2017 1716 - Completed/Met by Milderd Meager, RN Will remain free from infection 04/09/2017 1716 - Completed/Met by Milderd Meager, RN Diagnostic test results will improve 04/09/2017 1716 - Completed/Met by Milderd Meager, RN Respiratory complications will improve 04/09/2017 1716 - Completed/Met by Milderd Meager, RN Cardiovascular complication will be avoided 04/09/2017 1716 - Completed/Met by Milderd Meager, RN Activity: Risk for activity intolerance will decrease 04/09/2017 1716 - Completed/Met by Milderd Meager, RN Nutrition: Adequate nutrition will be maintained 04/09/2017 1716 - Completed/Met by Milderd Meager, RN Coping: Level of anxiety will decrease 04/09/2017 1716 - Completed/Met by Milderd Meager, RN Elimination: Will not experience complications related to bowel motility 04/09/2017 1716 - Completed/Met by Milderd Meager, RN Will not experience complications related to urinary retention 04/09/2017 1716 - Completed/Met by Milderd Meager, RN Pain Managment: General experience of comfort will improve 04/09/2017 1716 - Completed/Met by Milderd Meager, RN Safety: Ability to remain free from injury will improve 04/09/2017 1716 - Completed/Met by Milderd Meager, RN Skin Integrity: Risk for impaired skin integrity will decrease 04/09/2017 1716 - Completed/Met by Milderd Meager, RN Education: Verbalization of understanding the information provided (i.e., activity precautions, restrictions, etc) will improve 04/09/2017 1716 -  Completed/Met by Milderd Meager, RN Activity: Ability to ambulate and perform ADLs will improve 04/09/2017 1716 - Completed/Met by Milderd Meager, RN Clinical Measurements: Postoperative complications will be avoided or minimized 04/09/2017 1716 - Completed/Met by Milderd Meager, RN Self-Concept: Ability to maintain and perform role responsibilities to the fullest extent possible will improve 04/09/2017 1716 - Completed/Met by Milderd Meager, RN Pain Management: Pain level will decrease 04/09/2017 1716 - Completed/Met by Milderd Meager, RN

## 2017-04-09 NOTE — Progress Notes (Signed)
Pt ready for discharge to [facility]. Report called to [employer]. Pt to transport via EMS

## 2017-04-09 NOTE — Discharge Summary (Signed)
[facility] [place] NAME: Sarah Dougherty    MR#:  [id number]  DATE OF BIRTH:  [birth year]  DATE OF ADMISSION:  04/03/2017 ADMITTING PHYSICIAN: Gorden Harms, MD  DATE OF DISCHARGE: 04/09/2017  PRIMARY CARE PHYSICIAN: Perrin Maltese, MD   ADMISSION DIAGNOSIS:  Fall, initial encounter [phone]] Closed fracture of right hip, initial encounter ([place]) [S72.001A]  DISCHARGE DIAGNOSIS:  Active Problems:   Hip fx ([place])   SECONDARY DIAGNOSIS:   Past Medical History:  Diagnosis Date  . A-fib ([place])   . Arthritis    Osteoarthritis  . Asthma   . Chronic kidney disease    UTI  . COPD (chronic obstructive pulmonary disease) ([place])   . Coronary artery disease   . Diabetes mellitus without complication ([place])   . GERD (gastroesophageal reflux disease)   . Glaucoma (increased eye pressure)   . HOH (hard of hearing)    Left Hearing Aid  . Hypertension   . Hyperthyroidism   . On home oxygen therapy    uses at night  . Presence of permanent cardiac pacemaker   . Sleep apnea    No C-PAP     ADMITTING HISTORY  HISTORY OF PRESENT ILLNESS: Sarah Dougherty  is a 83 y.o. female with a known history per below status post mechanical fall, in the emergency room patient found to have right femoral neck fracture, orthopedic surgery planning tentative repair on Sunday given Eliquis for A. fib, patient complaining of right hip pain only, family at the bedside, patient admitted for acute right hip fracture status post mechanical fall.  HOSPITAL COURSE:   # Acute blood loss anemia status post hip surgery. Did not Need transfusion Hb stable at 10  # Hypoxia due to chronic bronchitis. Cotninue O2 2 L/min at discharge. CXR shows COPD Added incentive spirometer.  #Acute right femoral neck fracture status post mechanical fall POD - 2 Pain meds Eliquis for DVT prophylaxis. Takes this for Afib Staples to be removed in 2 weeks from surgery F/U with  Dr. Marry Guan in 4 weeks  #Chronic A. Fib Stable. Continue amiodarone. Restarted Eliquis today  #Diabetes mellitus type 2 SSI  #COPD without exacerbation Breathing treatments as needed  #Right eye infection Finished 5 days of ciprofloxacin  Stable for discharge to SNF for PT    CONSULTS OBTAINED:  Treatment Team:  Dereck Leep, MD  DRUG ALLERGIES:   Allergies  Allergen Reactions  . Amoxicillin Other (See Comments)    unknown  . Cephalexin Other (See Comments)    unknown  . Nitrofurantoin Other (See Comments)     Unknown  . Penicillins Other (See Comments)    Other reaction(s): Unknown  . Sulfa Antibiotics Hives and Itching  . Azithromycin Rash    DISCHARGE MEDICATIONS:   Allergies as of 04/09/2017      Reactions   Amoxicillin Other (See Comments)   unknown   Cephalexin Other (See Comments)   unknown   Nitrofurantoin Other (See Comments)    Unknown   Penicillins Other (See Comments)   Other reaction(s): Unknown   Sulfa Antibiotics Hives, Itching   Azithromycin Rash      Medication List    TAKE these medications   amiodarone 200 MG tablet Commonly known as:  PACERONE Take 100 mg by mouth daily.   amLODipine 5 MG tablet Commonly known as:  NORVASC Take 5 mg by mouth daily.   atorvastatin 20 MG tablet Commonly known as:  LIPITOR  Take 20 mg by mouth daily.   brimonidine 0.2 % ophthalmic solution Commonly known as:  ALPHAGAN Place 1 drop into the left eye 2 (two) times daily.   clotrimazole 1 % vaginal cream Commonly known as:  GYNE-LOTRIMIN Place 1 application vaginally daily as needed. For yeast infection.   ELIQUIS 5 MG Tabs tablet Generic drug:  apixaban Take 2.5 mg by mouth 2 (two) times daily.   glimepiride 2 MG tablet Commonly known as:  AMARYL Take 2 mg by mouth daily.   NEOSPORIN ECZEMA ESSENT MAX ST 1 % Generic drug:  hydrocortisone cream Apply 1 application topically 2 (two) times daily as needed for itching (for  sores.).   oxyCODONE 5 MG immediate release tablet Commonly known as:  Oxy IR/ROXICODONE Take 0.5-1 tablets (2.5-5 mg total) by mouth every 4 (four) hours as needed for moderate pain or severe pain.   PROAIR HFA 108 (90 Base) MCG/ACT inhaler Generic drug:  albuterol Inhale 1 puff into the lungs every 6 (six) hours as needed for wheezing or shortness of breath.   sodium chloride 0.65 % nasal spray Commonly known as:  OCEAN Place 2 sprays into the nose daily as needed.   travoprost (benzalkonium) 0.004 % ophthalmic solution Commonly known as:  TRAVATAN Place 1 drop into the left eye at bedtime.   TYLENOL 8 HOUR 650 MG CR tablet Generic drug:  acetaminophen Take 650 mg by mouth every 8 (eight) hours as needed for pain.   Vitamin D 2000 units tablet Take 2,000 Units by mouth daily.       Today   VITAL SIGNS:  Blood pressure (!) 117/53, pulse (!) 59, temperature 97.9 F (36.6 C), temperature source Oral, resp. rate 16, height 5\' 2"  (1.575 m), weight 48.8 kg (107 lb 8 oz), SpO2 94 %.  I/O:    Intake/Output Summary (Last 24 hours) at 04/09/2017 1505 Last data filed at 04/09/2017 1432 Gross per 24 hour  Intake 360 ml  Output 775 ml  Net -415 ml    PHYSICAL EXAMINATION:  Physical Exam  GENERAL:  83 y.o.-year-old patient lying in the bed with no acute distress.  LUNGS: Normal breath sounds bilaterally, no wheezing, rales,rhonchi CARDIOVASCULAR: S1, S2 normal. No murmurs, rubs, or gallops.  ABDOMEN: Soft, non-tender, non-distended. Bowel sounds present. No organomegaly or mass.  NEUROLOGIC: Moves all 4 extremities. PSYCHIATRIC: The patient is alert and awake SKIN: Right hip wound dressing  DATA REVIEW:   CBC Recent Labs  Lab 04/09/17 0421  WBC 11.0  HGB 10.3*  HCT 31.4*  PLT 261    Chemistries  Recent Labs  Lab 04/09/17 0421  NA 142  K 3.7  CL 110  CO2 27  GLUCOSE 163*  BUN 28*  CREATININE 0.70  CALCIUM 8.1*    Cardiac Enzymes No results for  input(s): TROPONINI in the last 168 hours.  Microbiology Results  Results for orders placed or performed during the hospital encounter of 04/03/17  Surgical PCR screen     Status: None   Collection Time: 04/05/17  3:41 AM  Result Value Ref Range Status   MRSA, PCR NEGATIVE NEGATIVE Final   Staphylococcus aureus NEGATIVE NEGATIVE Final    Comment: (NOTE) The Xpert SA Assay (FDA approved for NASAL specimens in patients 60 years of age and older), is one component of a comprehensive surveillance program. It is not intended to diagnose infection nor to guide or monitor treatment. Performed at [facility], [address] [place], [place] [address]  RADIOLOGY:  Dg Hip Port Unilat With Pelvis 1v Right  Result Date: 04/07/2017 CLINICAL DATA:  Status post right hip replacement. EXAM: DG HIP (WITH OR WITHOUT PELVIS) 1V PORT RIGHT COMPARISON:  Radiographs of April 03, 2017. FINDINGS: Right hemiarthroplasty is well situated. No fracture or dislocation is noted. Expected postoperative changes are noted in the surrounding soft tissues, including surgical drains. IMPRESSION: Status post right hemiarthroplasty. Electronically Signed   By: Marijo Conception, M.D.   On: 04/07/2017 18:19    Follow up with PCP in 1 week.  Management plans discussed with the patient, family and they are in agreement.  CODE STATUS:     Code Status Orders  (From admission, onward)        Start     Ordered   04/03/17 2206  Full code  Continuous     04/03/17 2205    Code Status History    Date Active Date Inactive Code Status Order ID Comments User Context   12/10/2016 13:27 12/12/2016 15:17 Full Code [id number]  Clyde Canterbury, MD Inpatient   04/07/2016 21:08 04/10/2016 17:06 Full Code [id number]  Vaughan Basta, MD Inpatient   12/12/2014 14:52 12/13/2014 18:28 Full Code [id number]  Isaias Cowman, MD Inpatient   12/09/2014 23:03 12/12/2014 14:52 Full Code [id number]  Baxter Hire, MD  Inpatient      TOTAL TIME TAKING CARE OF THIS PATIENT ON DAY OF DISCHARGE: more than 30 minutes.   Leia Alf Kilian Schwartz M.D on 04/09/2017 at 3:05 PM  Between 7am to 6pm - Pager - [phone]  After 6pm go to [email or web address] - password [email or web address] [facility]  Office  [phone]  CC: Primary care physician; Perrin Maltese, MD  Note: This dictation was prepared with Dragon dictation along with smaller phrase technology. Any transcriptional errors that result from this process are unintentional.

## 2017-04-09 NOTE — Discharge Instructions (Signed)
Staples to be out at 2 weeks.  F/U with Dr. Marry Guan in 4 weeks  Full weight bearing with PT

## 2017-04-09 NOTE — Clinical Social Work Placement (Signed)
   CLINICAL SOCIAL WORK PLACEMENT  NOTE  Date:  04/09/2017  Patient Details  Name: Sarah Dougherty MRN: [id number] Date of Birth: 03-Sep-1921  Clinical Social Work is seeking post-discharge placement for this patient at the [place] level of care (*CSW will initial, date and re-position this form in  chart as items are completed):  Yes   Patient/family provided with [facility] Work Department's list of facilities offering this level of care within the geographic area requested by the patient (or if unable, by the patient's family).  Yes   Patient/family informed of their freedom to choose among providers that offer the needed level of care, that participate in Medicare, Medicaid or managed care program needed by the patient, have an available bed and are willing to accept the patient.  Yes   Patient/family informed of [facility]'s ownership interest in [facility] and [facility], as well as of the fact that they are under no obligation to receive care at these facilities.  PASRR submitted to EDS on       PASRR number received on       Existing PASRR number confirmed on 04/06/17     FL2 transmitted to all facilities in geographic area requested by pt/family on 04/06/17     FL2 transmitted to all facilities within larger geographic area on       Patient informed that his/her managed care company has contracts with or will negotiate with certain facilities, including the following:        Yes   Patient/family informed of bed offers received.  Patient chooses bed at [facility] )     Physician recommends and patient chooses bed at      Patient to be transferred to [facility] ) on 04/09/17.  Patient to be transferred to facility by [facility] )     Patient family notified on 04/09/17 of transfer.  Name of family member notified:  (Patient's sons [name] and [name] are at bedside and aware of D/C today. )      PHYSICIAN       Additional Comment:    _______________________________________________ Shivan Hodes, Veronia Beets, LCSW 04/09/2017, 3:22 PM

## 2017-04-09 NOTE — Progress Notes (Signed)
Physical Therapy Treatment Patient Details Name: Sarah Dougherty MRN: [id number] DOB: June 19, 1921 Today's Date: 04/09/2017    History of Present Illness Pt is a 83 y.o. female s/p mechanical fall sustaining mildly displaced R subcapital femoral neck fx; recent ED visit for fall 03/24/17.  Surgery cancelled 04/05/17 d/t hypoxia.  Pt s/p R hip hemiarthroplasty 04/07/17.  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    PT Comments    After several attempt this pm when pt was sleeping soundly in bed, she was awake and ready to participate in session.  Participated in exercises as described below.  Pt agrees to gait again this pm and requests to remain up in chair at end of session.  Max a to edge of bed.  Pt stood with min/mod a x 2 and was able to ambulate 12' with small steps with decreased height and overall poor balance.  Leans to right on occasion but corrects with verbal and tactile cues.  Gait was improved this pm and did not require a seated rest for the same distance.  Remained up in chair for session.  Pt on 1 lpm at rest and sats noted to be 90-91%.  Increased to 2 lpm for gait only then returned to 1 lpm to maintain sats for mobility skills.   Follow Up Recommendations  SNF     Equipment Recommendations  Rolling walker with 5" wheels    Recommendations for Other Services       Precautions / Restrictions Precautions Precautions: Fall;Posterior Hip Precaution Booklet Issued: Yes (comment) Restrictions Weight Bearing Restrictions: Yes RLE Weight Bearing: Weight bearing as tolerated    Mobility  Bed Mobility Overal bed mobility: Needs Assistance Bed Mobility: Sit to Supine     Supine to sit: Max assist;HOB elevated Sit to supine: Max assist   General bed mobility comments: assist for trunk and B LE's sit to supine; vc's and tactile cues required for technique and posterior hip  precautions  Transfers Overall transfer level: Needs assistance Equipment used: Rolling walker (2 wheeled) Transfers: Sit to/from Stand Sit to Stand: Mod assist;+2 physical assistance            Ambulation/Gait Ambulation/Gait assistance: Mod assist;+2 physical assistance;+2 safety/equipment;Min assist Ambulation Distance (Feet): 12 Feet Assistive device: Rolling walker (2 wheeled) Gait Pattern/deviations: Step-to pattern;Decreased weight shift to right Gait velocity: decreased Gait velocity interpretation: <1.8 ft/sec, indicative of risk for recurrent falls General Gait Details: Somewhat improved this session from the morning but remains limited by pain/fatigue   Stairs            Wheelchair Mobility    Modified Rankin (Stroke Patients Only)       Balance Overall balance assessment: Needs assistance Sitting-balance support: Bilateral upper extremity supported;Feet supported Sitting balance-Leahy Scale: Fair Sitting balance - Comments: static sitting steady with SBA   Standing balance support: Bilateral upper extremity supported Standing balance-Leahy Scale: Poor Standing balance comment: static standing with B UE support on RW                            Cognition Arousal/Alertness: Awake/alert Behavior During Therapy: WFL for tasks assessed/performed Overall Cognitive Status: Within Functional Limits for tasks assessed  Exercises Total Joint Exercises Ankle Circles/Pumps: AROM;Strengthening;Both;10 reps;Supine Short Arc Quad: AROM;Strengthening;Both;10 reps;Supine Heel Slides: AAROM;Strengthening;Both;10 reps;Supine Hip ABduction/ADduction: AAROM;Strengthening;Both;10 reps;Supine    General Comments        Pertinent Vitals/Pain Pain Assessment: Faces Faces Pain Scale: Hurts little more Pain Location: R hip Pain Descriptors / Indicators: Operative site guarding;Sore Pain  Intervention(s): Limited activity within patient's tolerance;Monitored during session    Home Living                      Prior Function            PT Goals (current goals can now be found in the care plan section) Progress towards PT goals: Progressing toward goals    Frequency    BID      PT Plan Current plan remains appropriate    Co-evaluation              AM-PAC PT "6 Clicks" Daily Activity  Outcome Measure  Difficulty turning over in bed (including adjusting bedclothes, sheets and blankets)?: Unable Difficulty moving from lying on back to sitting on the side of the bed? : Unable Difficulty sitting down on and standing up from a chair with arms (e.g., wheelchair, bedside commode, etc,.)?: Unable Help needed moving to and from a bed to chair (including a wheelchair)?: A Lot Help needed walking in hospital room?: Total Help needed climbing 3-5 steps with a railing? : Total 6 Click Score: 7    End of Session Equipment Utilized During Treatment: Gait belt;Oxygen Activity Tolerance: Patient limited by pain;Patient limited by fatigue Patient left: in chair;with chair alarm set;with call bell/phone within reach   Pain - Right/Left: Right Pain - part of body: Hip     Time: 1435-1453 PT Time Calculation (min) (ACUTE ONLY): 18 min  Charges:  $Gait Training: 8-22 mins                    G Codes:       Chesley Noon, PTA 04/09/17, 3:02 PM

## 2017-04-09 NOTE — Progress Notes (Signed)
[facility] [place] NAME: Sarah Dougherty    MR#:  [id number]  DATE OF BIRTH:  June 27, 1921  SUBJECTIVE:  CHIEF COMPLAINT:   Chief Complaint  Patient presents with  . Fall   Right hip pain well controlled. Right hip drain removed Afebrile  REVIEW OF SYSTEMS:    Review of Systems  Constitutional: Positive for malaise/fatigue. Negative for chills and fever.  HENT: Negative for sore throat.   Eyes: Negative for blurred vision, double vision and pain.  Respiratory: Negative for cough, hemoptysis, shortness of breath and wheezing.   Cardiovascular: Negative for chest pain, palpitations, orthopnea and leg swelling.  Gastrointestinal: Negative for abdominal pain, constipation, diarrhea, heartburn, nausea and vomiting.  Genitourinary: Negative for dysuria and hematuria.  Musculoskeletal: Positive for falls and joint pain. Negative for back pain.  Skin: Negative for rash.  Neurological: Negative for sensory change, speech change, focal weakness and headaches.  Endo/Heme/Allergies: Does not bruise/bleed easily.  Psychiatric/Behavioral: Negative for depression. The patient is not nervous/anxious.     DRUG ALLERGIES:   Allergies  Allergen Reactions  . Amoxicillin Other (See Comments)    unknown  . Cephalexin Other (See Comments)    unknown  . Nitrofurantoin Other (See Comments)     Unknown  . Penicillins Other (See Comments)    Other reaction(s): Unknown  . Sulfa Antibiotics Hives and Itching  . Azithromycin Rash    VITALS:  Blood pressure (!) 117/53, pulse (!) 59, temperature 97.9 F (36.6 C), temperature source Oral, resp. rate 16, height 5\' 2"  (1.575 m), weight 48.8 kg (107 lb 8 oz), SpO2 94 %.  PHYSICAL EXAMINATION:   Physical Exam  GENERAL:  83 y.o.-year-old patient lying in the bed with no acute distress.  EYES:  No scleral icterus. Extraocular muscles intact.  HEENT: Head atraumatic, normocephalic. Oropharynx and nasopharynx  clear.  NECK:  Supple, no jugular venous distention. No thyroid enlargement, no tenderness.  LUNGS: Normal breath sounds bilaterally, no wheezing, rales, rhonchi. No use of accessory muscles of respiration.  CARDIOVASCULAR: S1, S2 normal. No murmurs, rubs, or gallops.  ABDOMEN: Soft, nontender, nondistended. Bowel sounds present. No organomegaly or mass.  EXTREMITIES: No cyanosis, clubbing or edema b/l.    Right hip  dressing NEUROLOGIC: Cranial nerves II through XII are intact. No focal Motor or sensory deficits b/l.   PSYCHIATRIC: The patient is alert and awake SKIN: No obvious rash, lesion, or ulcer.   LABORATORY PANEL:   CBC Recent Labs  Lab 04/09/17 0421  WBC 11.0  HGB 10.3*  HCT 31.4*  PLT 261   ------------------------------------------------------------------------------------------------------------------ Chemistries  Recent Labs  Lab 04/09/17 0421  NA 142  K 3.7  CL 110  CO2 27  GLUCOSE 163*  BUN 28*  CREATININE 0.70  CALCIUM 8.1*   ------------------------------------------------------------------------------------------------------------------  Cardiac Enzymes No results for input(s): TROPONINI in the last 168 hours. ------------------------------------------------------------------------------------------------------------------  RADIOLOGY:  Dg Hip Port Unilat With Pelvis 1v Right  Result Date: 04/07/2017 CLINICAL DATA:  Status post right hip replacement. EXAM: DG HIP (WITH OR WITHOUT PELVIS) 1V PORT RIGHT COMPARISON:  Radiographs of April 03, 2017. FINDINGS: Right hemiarthroplasty is well situated. No fracture or dislocation is noted. Expected postoperative changes are noted in the surrounding soft tissues, including surgical drains. IMPRESSION: Status post right hemiarthroplasty. Electronically Signed   By: Marijo Conception, M.D.   On: 04/07/2017 18:19     ASSESSMENT AND PLAN:   #Acute blood loss anemia status post hip surgery. Stable  Need for  transfusion  # Hypoxia due to chronic bronchitis. Needs home O2 at discharge. CXR shows COPD Added incentive spirometer. Continue oxygen  # Acute right femoral neck fracture status post mechanical fall Surgery today Off eliquis Pain meds PT after surgery  DVT prophylaxis.  Patient is on Eliquis at home.  Will restart today.  # Chronic A. Fib Stable. Continue amiodarone. Restart Eliquis today  # Diabetes mellitus type 2 SSI  # COPD without exacerbation Breathing treatments as needed  # Right eye infection Cipro eyedrops  All the records are reviewed and case discussed with Care Management/Social Worker Management plans discussed with the patient, family and they are in agreement.  CODE STATUS: FULL CODE  DVT Prophylaxis: SCDs  TOTAL TIME TAKING CARE OF THIS PATIENT: 35 minutes.   Discharge to skilled nursing facility tomorrow  Neita Carp M.D on 04/09/2017 at 12:50 PM  Between 7am to 6pm - Pager - [phone]  After 6pm go to [email or web address] - password [email or web address] [place] [facility]  Office  [phone]  CC: Primary care physician; Perrin Maltese, MD  Note: This dictation was prepared with Dragon dictation along with smaller phrase technology. Any transcriptional errors that result from this process are unintentional.

## 2017-04-10 ENCOUNTER — Ambulatory Visit: Payer: PPO | Attending: Radiation Oncology | Admitting: Radiation Oncology

## 2017-04-10 DIAGNOSIS — I482 Chronic atrial fibrillation, unspecified: Secondary | ICD-10-CM | POA: Insufficient documentation

## 2017-04-10 DIAGNOSIS — M8000XD Age-related osteoporosis with current pathological fracture, unspecified site, subsequent encounter for fracture with routine healing: Secondary | ICD-10-CM | POA: Insufficient documentation

## 2017-04-10 DIAGNOSIS — J449 Chronic obstructive pulmonary disease, unspecified: Secondary | ICD-10-CM | POA: Diagnosis not present

## 2017-04-10 DIAGNOSIS — J9611 Chronic respiratory failure with hypoxia: Secondary | ICD-10-CM | POA: Diagnosis not present

## 2017-04-10 DIAGNOSIS — E119 Type 2 diabetes mellitus without complications: Secondary | ICD-10-CM | POA: Diagnosis not present

## 2017-04-16 ENCOUNTER — Other Ambulatory Visit: Payer: Self-pay | Admitting: *Deleted

## 2017-04-16 DIAGNOSIS — J441 Chronic obstructive pulmonary disease with (acute) exacerbation: Secondary | ICD-10-CM

## 2017-04-16 NOTE — Patient Outreach (Signed)
[facility]) Care Management  04/16/2017  ONIE KASPAREK [birth year] [id number]   Met with Milana Kidney, SW at facility. She reports patient lives with son. She has great family support. She plans to set up [facility] home [facility].   Met with patient son at facility. Patient getting therapy.  RNCM reviewed [facility] care management program.  He feels [facility] care management services could be of great benefit to them.  RNCM gave [facility] care management packet.   Patient has [facility] care management pending.  Plan to confirm referral status and will let Care team know when discharge established.   Royetta Crochet. Laymond Purser, RN, BSN, [place] [phone]) Business Cell  [phone]) Toll Free Office

## 2017-04-16 NOTE — Addendum Note (Signed)
Addended by: Burgess Amor E on: 04/16/2017 04:44 PM   Modules accepted: Orders

## 2017-04-28 ENCOUNTER — Other Ambulatory Visit: Payer: Self-pay | Admitting: *Deleted

## 2017-04-28 NOTE — Patient Outreach (Signed)
[place] [facility]) Care Management  04/29/2017  Sarah Dougherty 03-25-1922 [id number]   Patient currently in rehab at [facility] following a hospital stay for a hip surgery. This social worker spoke with discharge planner Milana Kidney, who states that patient will discharge home on 04/30/17 with [place] through [facility]. Patient will discharge with a new walker. Patient's 3 son's at bedside. [facility] care management program discussed, [facility] consent signed by patient. Patient describes positive family support.Currently resides with her son  Sarah Dougherty, who is her main caregiver. Per Sarah Dougherty., he is the one who takes care of all of her basic needs. Patient has a wheelchair  ramp, raised toilet seat, bath chair and a hand held shower hose. Per patient's son, he would like to get a wheelchair and return the walker as patient already has one at home.  This social worker made son's request know to discharge planner who will follow up with patient's son.   Plan: Patient to discharge home on 04/30/17. This Education officer, museum to inform [place] for transition of care. This social worker to follow up with patient post discharge from rehab to assess for any continued community resource needs.   Sheralyn Boatman [facility] Care Management [phone]

## 2017-04-29 ENCOUNTER — Encounter: Payer: Self-pay | Admitting: *Deleted

## 2017-04-29 ENCOUNTER — Other Ambulatory Visit: Payer: Self-pay | Admitting: *Deleted

## 2017-04-29 NOTE — Progress Notes (Signed)
This encounter was created in error - please disregard.

## 2017-04-29 NOTE — Patient Outreach (Signed)
[facility] [facility]) Care Management  04/29/2017  ALEIDA CRANDELL 18-Jan-1922 [id number]   Notified by [facility] that patient is set to discharge 04/30/17. Plan to let [facility] care management know about planned discharge. Royetta Crochet. Laymond Purser, RN, BSN, [place] [phone]) Business Cell  [phone]) Toll Free Office

## 2017-05-01 ENCOUNTER — Encounter: Payer: Self-pay | Admitting: *Deleted

## 2017-05-01 ENCOUNTER — Other Ambulatory Visit: Payer: Self-pay | Admitting: *Deleted

## 2017-05-01 NOTE — Patient Outreach (Signed)
05/01/17   Sarah Dougherty 1921-08-12 [id number]  Successful telephone encounter for Sarah Dougherty, 84 year old female, follow up on referral from [place] post acute care coordinator 04/27/17 to follow up with pt post SNF discharge for transition of care.    Pt discharged from [facility] 1/17, admitted to rehab after recent hospitalization December 21-27,2018 for fall, closed  Fracture of right hip.   Pt's history includes but not limited to COPD, Bradycardia, squamous cell carcinoma,  Malignant neoplasm of parotid gland.      Pt answered the phone but due to being very [place], handed phone to son.  Spoke with son Sarah Dougherty (on [facility] consent form). HIPAA identifiers verified on pt, discussed purpose of call- follow up  On referral on recent SNF discharge/transition of care.  Son reports pt is doing great, walking with her walker,  Going to the bathroom.  Son reports pt has not had any pain for a week, sugars range 119-120, BP today 121/60. Son reports he lives with pt, provides 27/7 care.  Son reports pt is taking all of her medications as ordered,  Manages her own medications/does pill planner.   This RN CM unable to review pt's discharge medications with son due to pt discharging from [facility].   Son reports pt to follow up with PCP 05/04/17, surgeon next week.    RN CM discussed with son [facility] transition of care program, follow up with pt 31 days - weekly calls, a home visit   benefit of her insurance.    Plan:  As discussed with son, plan to follow up with pt next week- initial home visit.           Barrier letter sent to Dr. Lamonte Sakai informing of [facility] involvement.   [name]   [place] Care Management  [phone]

## 2017-05-05 ENCOUNTER — Encounter: Payer: Self-pay | Admitting: *Deleted

## 2017-05-05 ENCOUNTER — Other Ambulatory Visit: Payer: Self-pay | Admitting: *Deleted

## 2017-05-05 NOTE — Patient Outreach (Signed)
[facility] [facility]) Care Management   05/05/2017  BROOKLEY SPITLER January 17, 1922 [id number]  SAHMYA ARAI is an 84 y.o. female  Subjective:   Pt reports no pain in right hip, pain comes when standing on both legs/has Arthritis.  Son reports pt's knees are bone on bone, not a surgical candidate, gets shots Every 6 months, helps.  Pt reports she has been walking around the house every day, per  Son [facility] PT to start this week.   Pt reports to see surgeon tomorrow and PCP next  Week.   Son reports on pt's neck surgery last year, cancer removed/radiation done.  Pt  Reports has no taste since the radiation, son reports pt's appetite is better, has 1 to 1.5  Ensure a day.    Objective:   Vitals:   05/05/17 1316 05/05/17 1449  BP: (!) 94/54 122/68  Pulse: 65   Resp: (!) 24   SpO2: 97%     ROS  Physical Exam  Constitutional: She is oriented to person, place, and time. She appears well-developed and well-nourished.  Cardiovascular: Normal rate, regular rhythm and normal heart sounds.  Pt has pacemaker   Respiratory: Effort normal.  GI: Soft. Bowel sounds are normal.  Musculoskeletal: Normal range of motion. She exhibits no edema.  Neurological: She is alert and oriented to person, place, and time.  HOH   Skin: Skin is warm and dry.  Psychiatric: She has a normal mood and affect. Her behavior is normal. Judgment and thought content normal.    Encounter Medications:   Outpatient Encounter Medications as of 05/05/2017  Medication Sig  . acetaminophen (TYLENOL 8 HOUR) 650 MG CR tablet Take 650 mg by mouth every 8 (eight) hours as needed for pain.  Marland Kitchen albuterol (PROAIR HFA) 108 (90 Base) MCG/ACT inhaler Inhale 1 puff into the lungs every 6 (six) hours as needed for wheezing or shortness of breath.  Marland Kitchen amiodarone (PACERONE) 200 MG tablet Take 100 mg by mouth daily.  Marland Kitchen amLODipine (NORVASC) 5 MG tablet Take 5 mg by mouth daily.  Marland Kitchen apixaban (ELIQUIS) 5 MG TABS tablet Take 2.5 mg by  mouth 2 (two) times daily.  Marland Kitchen atorvastatin (LIPITOR) 20 MG tablet Take 20 mg by mouth daily.  . brimonidine (ALPHAGAN) 0.2 % ophthalmic solution Place 1 drop into the left eye 2 (two) times daily.  . Cholecalciferol (VITAMIN D) 2000 units tablet Take 2,000 Units by mouth daily.  . clotrimazole (GYNE-LOTRIMIN) 1 % vaginal cream Place 1 application vaginally daily as needed. For yeast infection.  Marland Kitchen glimepiride (AMARYL) 2 MG tablet Take 2 mg by mouth daily.  . hydrocortisone cream (NEOSPORIN ECZEMA ESSENT MAX ST) 1 % Apply 1 application topically 2 (two) times daily as needed for itching (for sores.).  Marland Kitchen oxyCODONE (OXY IR/ROXICODONE) 5 MG immediate release tablet Take 0.5-1 tablets (2.5-5 mg total) by mouth every 4 (four) hours as needed for moderate pain or severe pain.  . sodium chloride (OCEAN) 0.65 % nasal spray Place 2 sprays into the nose daily as needed.  . travoprost, benzalkonium, (TRAVATAN) 0.004 % ophthalmic solution Place 1 drop into the left eye at bedtime.   No facility-administered encounter medications on file as of 05/05/2017.     Functional Status:   In your present state of health, do you have any difficulty performing the following activities: 05/05/2017 04/03/2017  Hearing? Y Y  Comment - -  Vision? N Y  Difficulty concentrating or making decisions? N N  Walking or  climbing stairs? Y Y  Dressing or bathing? Y N  Doing errands, shopping? Y N  Preparing Food and eating ? Y -  Using the Toilet? N -  In the past six months, have you accidently leaked urine? Y -  Do you have problems with loss of bowel control? N -  Managing your Medications? N -  Managing your Finances? Y -  Housekeeping or managing your Housekeeping? Y -  Some recent data might be hidden    Fall/Depression Screening:    Fall Risk  05/05/2017 12/31/2016  Falls in the past year? Yes No  Number falls in past yr: 2 or more -  Injury with Fall? Yes -  Risk for fall due to : Other (Comment) -  Risk for  fall due to: Comment recent closed right hip fracture  -  Follow up Falls prevention discussed -   PHQ 2/9 Scores 05/05/2017 12/31/2016  PHQ - 2 Score 0 0    Assessment:  Pleasant 84 year old female, HOH, son resides with her/present during home visit.  RN CM following pt for transition of care/SNF discharge 04/30/17, admitted to rehab after recent  Hospitalization December 21-27,2018 for fall, closed fracture of right hip.   Pt's history includes  But not limited to COPD, Bradycardia, squamous cell carcinoma, malignant neoplasm of parotid  Gland.     Closed fracture of right hip: today no pain.  Observed pt ambulating with walker short distance,    Tolerated well.  Medications: pt manages her own medications/uses pill planner.  Review of medications- pt     Reported taking her Lipitor every other day due to pain in legs/MD not aware.  Hypotension:  BP today 94/54 right arm, rechecked after pt had approximately 8 oz of water -    Result 122/68.   Discussed with pt importance of hydration as pt aware needs to drink more.    Plan:  As discussed with pt, to let PCP know at upcoming visit not taking Lipitor daily due to      Pain in legs.             As discussed with pt and son, importance of hydration.             As discussed with pt and son, plan to continue to follow for transition of care- follow      Up again next week telephonically.             Plan to send Dr. Lamonte Sakai 05/05/17 home visit encounter.     THN CM Care Plan Problem One     Most Recent Value  Care Plan Problem One  Risk for readmission related to recent SNF discharge and hospitalization for closed fracture of right hip   Role Documenting the Problem One  Care Management [place] for Problem One  Active  THN Long Term Goal   Pt will not readmit to SNF or hospital within the next 31 days   THN Long Term Goal Start Date  05/01/17  Interventions for Problem One Long Term Goal  initial h/v done, Emmi  Bipolar Hip replacement d/c instructions provided/reviewed wiith pt and son.   THN CM Short Term Goal #1   Pt will keep all MD appointments in the next 30 days   THN CM Short Term Goal #1 Start Date  05/01/17  Interventions for Short Term Goal #1  discussed with pt/son upcoming MD appointments - informed to see  surgeon 1/23.   THN CM Short Term Goal #2   Pt will continue to be pain free from recent right hip surgery in the next 30 days   THN CM Short Term Goal #2 Start Date  05/01/17  Interventions for Short Term Goal #2  Assessed pt's current pain status- per pt no pain     Rose M.   [place] Care Management  [phone]

## 2017-05-06 DIAGNOSIS — Z85831 Personal history of malignant neoplasm of soft tissue: Secondary | ICD-10-CM | POA: Diagnosis not present

## 2017-05-08 ENCOUNTER — Other Ambulatory Visit: Payer: Self-pay | Admitting: *Deleted

## 2017-05-08 NOTE — Patient Outreach (Signed)
05/08/17   12:20 pm-  Successful telephone encounter to son Sarah Dougherty (on [facility] consent) for Sarah Dougherty, 84 year old female-  Follow up on call son made this am to [facility] main office about [facility] was suppose to provide therapy this am to pt.   Spoke with son, HIPAA identifiers verified on pt.  Son reports thought someone was suppose to come this am for  Therapy, pt upset, does not know who to call.   Relayed to son (view in EMR) [facility] home health was ordered,  RN CM offered to call  the agency for him to which permission given/appreciative of outreach call.   12:23 pm- Outreach call to [facility] home health about services for pt, was informed pt has a [facility] RN  Visit scheduled for today, contact number provided for [facility] RN to which RN CM left a voice message relaying To follow up with son/contact number provided.   12:30 pm- follow up call to son, HIPAA identifiers provided on pt.   Relayed to pt follow up call to [facility] RN scheduled for a visit today.   Provided son with [facility] contact number to call if needed in  The future.    Plan:  RN CM to follow up with pt next week telephonically- part of ongoing transition of care.    Sarah Dougherty.   [place] Care Management  [phone]

## 2017-05-12 ENCOUNTER — Other Ambulatory Visit: Payer: Self-pay | Admitting: *Deleted

## 2017-05-12 DIAGNOSIS — J449 Chronic obstructive pulmonary disease, unspecified: Secondary | ICD-10-CM | POA: Diagnosis not present

## 2017-05-12 DIAGNOSIS — Z950 Presence of cardiac pacemaker: Secondary | ICD-10-CM | POA: Diagnosis not present

## 2017-05-12 DIAGNOSIS — Z96641 Presence of right artificial hip joint: Secondary | ICD-10-CM | POA: Diagnosis not present

## 2017-05-12 DIAGNOSIS — Z792 Long term (current) use of antibiotics: Secondary | ICD-10-CM | POA: Diagnosis not present

## 2017-05-12 DIAGNOSIS — Z96649 Presence of unspecified artificial hip joint: Secondary | ICD-10-CM | POA: Diagnosis not present

## 2017-05-12 DIAGNOSIS — S72141D Displaced intertrochanteric fracture of right femur, subsequent encounter for closed fracture with routine healing: Secondary | ICD-10-CM | POA: Diagnosis not present

## 2017-05-12 DIAGNOSIS — I10 Essential (primary) hypertension: Secondary | ICD-10-CM | POA: Diagnosis not present

## 2017-05-12 DIAGNOSIS — Z7984 Long term (current) use of oral hypoglycemic drugs: Secondary | ICD-10-CM | POA: Diagnosis not present

## 2017-05-12 DIAGNOSIS — E782 Mixed hyperlipidemia: Secondary | ICD-10-CM | POA: Diagnosis not present

## 2017-05-12 DIAGNOSIS — N390 Urinary tract infection, site not specified: Secondary | ICD-10-CM | POA: Diagnosis not present

## 2017-05-12 DIAGNOSIS — N189 Chronic kidney disease, unspecified: Secondary | ICD-10-CM | POA: Diagnosis not present

## 2017-05-12 DIAGNOSIS — J209 Acute bronchitis, unspecified: Secondary | ICD-10-CM | POA: Diagnosis not present

## 2017-05-12 DIAGNOSIS — E1122 Type 2 diabetes mellitus with diabetic chronic kidney disease: Secondary | ICD-10-CM | POA: Diagnosis not present

## 2017-05-12 DIAGNOSIS — I2510 Atherosclerotic heart disease of native coronary artery without angina pectoris: Secondary | ICD-10-CM | POA: Diagnosis not present

## 2017-05-12 DIAGNOSIS — J019 Acute sinusitis, unspecified: Secondary | ICD-10-CM | POA: Diagnosis not present

## 2017-05-12 DIAGNOSIS — M170 Bilateral primary osteoarthritis of knee: Secondary | ICD-10-CM | POA: Diagnosis not present

## 2017-05-12 DIAGNOSIS — M1990 Unspecified osteoarthritis, unspecified site: Secondary | ICD-10-CM | POA: Diagnosis not present

## 2017-05-12 DIAGNOSIS — Z9181 History of falling: Secondary | ICD-10-CM | POA: Diagnosis not present

## 2017-05-12 DIAGNOSIS — H409 Unspecified glaucoma: Secondary | ICD-10-CM | POA: Diagnosis not present

## 2017-05-12 DIAGNOSIS — Z7902 Long term (current) use of antithrombotics/antiplatelets: Secondary | ICD-10-CM | POA: Diagnosis not present

## 2017-05-12 DIAGNOSIS — I129 Hypertensive chronic kidney disease with stage 1 through stage 4 chronic kidney disease, or unspecified chronic kidney disease: Secondary | ICD-10-CM | POA: Diagnosis not present

## 2017-05-12 DIAGNOSIS — I482 Chronic atrial fibrillation: Secondary | ICD-10-CM | POA: Diagnosis not present

## 2017-05-12 NOTE — Patient Outreach (Signed)
05/12/17  Unsuccessful telephone encounter to son Sarah Dougherty for Sarah Dougherty, 84 year old female, follow up on pt's  Home health PT services through [facility] as this RN CM was informed by Royann Shivers RN [facility] care management Director today several calls made by son  to [facility] practice liaison - [facility] PT not showing up.   HIPAA compliant voice message left with RN CM's contact information.    Plan: if no response to voice message left, plan to follow up again tomorrow telephonically.    [name]   [place] Care Management  [phone]

## 2017-05-14 ENCOUNTER — Encounter: Payer: Self-pay | Admitting: *Deleted

## 2017-05-14 ENCOUNTER — Ambulatory Visit: Payer: PPO | Admitting: *Deleted

## 2017-05-14 ENCOUNTER — Other Ambulatory Visit: Payer: Self-pay | Admitting: *Deleted

## 2017-05-14 NOTE — Patient Outreach (Addendum)
[facility]) Care Management  05/14/2017  TRINNA KUNST 1921/07/02 [id number]   Phone call to patient to follow up on [facility], spoke with Antony Haste  on [facility] consent who states that the PT was supposed to come out today at 2:00pm., however no one has shown up yet.  Phone call ot [facility], voicemail left requesting a return call.  Phone call to patient's son, confirmed that the PT  From [facility] did show up.   Sheralyn Boatman [facility] Care Management [phone]

## 2017-05-15 ENCOUNTER — Other Ambulatory Visit: Payer: Self-pay | Admitting: *Deleted

## 2017-05-15 NOTE — Patient Outreach (Signed)
05/15/2017   Successful telephone encounter to son Sarah Dougherty (on [facility] consent) for Sarah Dougherty, 84 year old female - Transition of care, ongoing follow up on pt's  SNF discharge 04/30/17, admitted to rehab after recent  Hospitalization December 21-27,2019 for fall,closed fracture of right hip.  Spoke with son, HIPAA  Identifiers verified on pt.  Son reports on pt's recent follow up visit with surgeon (Dr. Marry Guan), MD happy with  Progress.  Son reports this am pt walked by herself, fell due to leg gave out - no injury.  Son reports [place] out this week, suppose to come back today.  Son reports [facility] PT informed him cannot come out if  [facility] services are also coming as  pt needs the therapy more than nursing.    Discussed with son both services can come out at the same time, to have [facility] PT call RN CM today to which son states will relay.  Plan:  As discussed with son, plan to follow up on pt again next week telephonically.  [name]   [place] Care Management  [phone]

## 2017-05-18 ENCOUNTER — Other Ambulatory Visit: Payer: Self-pay | Admitting: *Deleted

## 2017-05-18 ENCOUNTER — Ambulatory Visit: Payer: PPO | Admitting: Radiation Oncology

## 2017-05-18 ENCOUNTER — Encounter: Payer: Self-pay | Admitting: Radiation Oncology

## 2017-05-18 ENCOUNTER — Other Ambulatory Visit: Payer: Self-pay

## 2017-05-18 ENCOUNTER — Ambulatory Visit
Admission: RE | Admit: 2017-05-18 | Discharge: 2017-05-18 | Disposition: A | Discharge status: Home / Self Care | Payer: PPO | Source: Ambulatory Visit | Attending: Radiation Oncology | Admitting: Radiation Oncology

## 2017-05-18 VITALS — BP 108/69 | HR 64 | Temp 95.8°F | Resp 20

## 2017-05-18 DIAGNOSIS — Z923 Personal history of irradiation: Secondary | ICD-10-CM | POA: Insufficient documentation

## 2017-05-18 DIAGNOSIS — J029 Acute pharyngitis, unspecified: Secondary | ICD-10-CM | POA: Insufficient documentation

## 2017-05-18 DIAGNOSIS — N390 Urinary tract infection, site not specified: Secondary | ICD-10-CM | POA: Insufficient documentation

## 2017-05-18 DIAGNOSIS — Z8589 Personal history of malignant neoplasm of other organs and systems: Secondary | ICD-10-CM | POA: Diagnosis not present

## 2017-05-18 DIAGNOSIS — B370 Candidal stomatitis: Secondary | ICD-10-CM | POA: Insufficient documentation

## 2017-05-18 DIAGNOSIS — C07 Malignant neoplasm of parotid gland: Secondary | ICD-10-CM

## 2017-05-18 MED ORDER — FLUCONAZOLE 100 MG PO TABS: 100.0000 mg | ORAL_TABLET | Freq: Every day | ORAL | 0 refills | Status: DC

## 2017-05-18 NOTE — Patient Outreach (Signed)
05/18/2017   9:30 am -  Received a call from son [name] (on [facility] consent) requesting RN CM come out and check pt's vital signs, HIPAA identifiers verified on pt.  Son reports Dr. Humphrey Rolls started pt on an antibiotic Levofloxacin 250 mg for 7 days for UTI, lost one pill so medication was completed last night, no  Further symptoms reported by pt.   Son reports reason for request for RN CM to check pt's  Vital signs is  pt has been hallucinating at night, pharmacy called and was informed antibiotic pt was taking is strong, has side effects but should be out of her system in 24 hours.  Son reports pt Has an appointment to follow up with radiation MD this afternoon.  RN CM discussed with son will have to look at schedule- visit scheduled with another pt today to which son reports  don't come back then since you have other patients to see, will call [facility] agency to have them come out.  RN CM discussed with son will continue to do calls to which son did not want RN CM to come back, hung  Up phone.     Plan:  RN CM to close case if do not hear back from son within 2 days.    [name]   [place] Care Management  [phone]

## 2017-05-18 NOTE — Progress Notes (Signed)
Radiation Oncology Follow up Note  Name: Sarah Dougherty   Date:   05/18/2017 MRN:  [id number] DOB: Aug 08, 1921    This 84 y.o. female presents to the clinic today for two-month follow-up status post external beam radiation therapy. To her left neck and parotid bed status post paroidrdectomy and left cervical lymph node resection  REFERRING PROVIDER: Perrin Maltese, MD  HPI: Patient is a 84 year old female now out 2 months having completed radiation therapy to her left parotid bed and left neck for metastatic squamous cell carcinoma with involvement of one level I and level II lymph nodes.. She is seen today and is doing poorly she's recently been on antibiotic therapy for UTI. She's also having a sore throat. She's also been having some hallucinations may be related to the antibiotic therapy. Her son is with her today she is only able to communicate barely. She's recently seen in the ENT and he is happy with her overall response.  COMPLICATIONS OF TREATMENT: none  FOLLOW UP COMPLIANCE: keeps appointments   PHYSICAL EXAM:  BP 108/69   Pulse 64   Temp (!) 95.8 F (35.4 C)   Resp 20  Oral cavity shows extensive oral candidiasis. Left parotid bed is clear. No evidence of subject gastric cervical or supra clavicular adenopathy is identified. Lungs are clear to A&P. Well-developed well-nourished patient in NAD. HEENT reveals PERLA, EOMI, discs not visualized.  Oral cavity is clear. No oral mucosal lesions are identified. Neck is clear without evidence of cervical or supraclavicular adenopathy. Lungs are clear to A&P. Cardiac examination is essentially unremarkable with regular rate and rhythm without murmur rub or thrill. Abdomen is benign with no organomegaly or masses noted. Motor sensory and DTR levels are equal and symmetric in the upper and lower extremities. Cranial nerves II through XII are grossly intact. Proprioception is intact. No peripheral adenopathy or edema is identified. No motor or  sensory levels are noted. Crude visual fields are within normal range.  RADIOLOGY RESULTS: No current films for review  PLAN: At this time am starting her on 10 day course of Diflucan for oral candidiasis. Otherwise she has no evidence of disease at this time. She's currently under the care of a PA at of Dr. Lisette Abu office. I've asked the son to continue follow-up care with them for her other comorbidities. I've asked to see her back in 6 months for follow-up. She continues close follow-up care with ENT.  I would like to take this opportunity to thank you for allowing me to participate in the care of your patient.Noreene Filbert, MD

## 2017-05-18 NOTE — Patient Outreach (Signed)
Late entry for Telephone encounters on   05/15/17.    4:46 pm-  Received a call from son [name] (on [facility] consent)- reports HH PT told him [facility] RN CM services could  Not come out if home health is in.   RN CM discussed with son again  The differences between [facility] services and home health to which son voiced understanding.   As discussed with son, plan to call [facility].  RN CM called [facility], spoke with clinical director about [facility] PT, [facility] not a duplication of  Services to which she acknowledged.   Clinical Director to follow up with [facility] PT.   5:01pm - Received a call from son Keelee Yankey (on [facility] consent)- HIPAA identifiers verified on pt. Son reports received a call from [facility] PT to let them know [facility] RN CM not a duplication of services. Son provided contact number to call if [name] not available.     Plan:  As discussed with son, RN CM to follow up again next week telephonically (part of ongoing  Transition of care).  [name]   [place] Care Management  [phone]

## 2017-05-20 ENCOUNTER — Other Ambulatory Visit: Payer: Self-pay | Admitting: *Deleted

## 2017-05-20 NOTE — Patient Outreach (Signed)
05/20/2017  Unsuccessful telephone encounter to son Kenyotta Dorfman for Sarah Dougherty, 84 year old female- attempt call  To discuss with son again  [place] services as compared with [facility] services.   Received a call from  Son on 05/18/17 wanting RN CM to come out and check pt's vital signs that day, per son pt started on an  Antibiotic Levofloxacin 250 mg for UTI, pt had  side effect of hallucination at night, pharmacy called/son informed  Antibiotic strong- side effect should be out of pt's system in 24 hours.  Because this RN CM could not come out  On 05/18/17 to do vital signs, son did not want RN CM to come back, to call [facility] agency and have them come out.  HIPAA compliant voice message left with RN CM's contact information.   Plan:  If do not hear back from son, plan to close case and notify PCP.   [name]   [place] Care Management  [phone]

## 2017-05-21 DIAGNOSIS — N390 Urinary tract infection, site not specified: Secondary | ICD-10-CM | POA: Diagnosis not present

## 2017-05-21 DIAGNOSIS — R300 Dysuria: Secondary | ICD-10-CM | POA: Diagnosis not present

## 2017-05-22 ENCOUNTER — Other Ambulatory Visit: Payer: Self-pay | Admitting: *Deleted

## 2017-05-22 NOTE — Patient Outreach (Signed)
[facility]) Care Management  05/22/2017  Sarah Dougherty January 22, 1922 [id number]   Phone call to patient's son Sarah Dougherty on [facility] consent to assess for community resource needs post discharge from the SNF. Per patient's son, she is doing well. UTI symptoms improving. Per patient's son, he is very pleased  with the [facility] agency nurse and PT. Patient's son manages patient's medications, transports her to medical appointments and takes care of her household needs. This Education officer, museum explained the difference between [facility] abd [place] services and reviewed care management role. Patient's son voiced an understanding. Patient's son verbalized having no community resource needs at this time.    Plan: Patient being closed to [place] work at this time explained to patient's son. This social worker's contact information provided if future needs arise.   Sarah Dougherty [facility] Care Management [phone]

## 2017-05-23 NOTE — Telephone Encounter (Signed)
This encounter was created in error - please disregard.

## 2017-05-25 DIAGNOSIS — M170 Bilateral primary osteoarthritis of knee: Secondary | ICD-10-CM | POA: Diagnosis not present

## 2017-05-25 DIAGNOSIS — N390 Urinary tract infection, site not specified: Secondary | ICD-10-CM | POA: Diagnosis not present

## 2017-05-25 DIAGNOSIS — Z96649 Presence of unspecified artificial hip joint: Secondary | ICD-10-CM | POA: Diagnosis not present

## 2017-05-27 ENCOUNTER — Ambulatory Visit: Payer: Self-pay | Admitting: *Deleted

## 2017-05-28 ENCOUNTER — Encounter: Payer: Self-pay | Admitting: *Deleted

## 2017-05-28 ENCOUNTER — Other Ambulatory Visit: Payer: Self-pay | Admitting: *Deleted

## 2017-05-28 NOTE — Patient Outreach (Signed)
This RN CM to close case - Received a call from son on 05/18/17, son (on [facility] consent) declined RN CM services for pt.   Another follow up call to son made 05/22/17, no response.  View in EMR [facility] LCSW made an outreach to son on 06/11/17, verbalized no needs, pleased with [facility] RN and PT.      Plan:  RN CM to inform pt's PCP of discharge, send case closure letter.            RN CM to inform [facility] CMA to close case.    [name]   [place] Care Management  [phone]

## 2017-06-01 ENCOUNTER — Ambulatory Visit: Payer: Self-pay | Admitting: *Deleted

## 2017-06-08 DIAGNOSIS — E1122 Type 2 diabetes mellitus with diabetic chronic kidney disease: Secondary | ICD-10-CM | POA: Diagnosis not present

## 2017-06-08 DIAGNOSIS — S72141D Displaced intertrochanteric fracture of right femur, subsequent encounter for closed fracture with routine healing: Secondary | ICD-10-CM | POA: Diagnosis not present

## 2017-06-08 DIAGNOSIS — N189 Chronic kidney disease, unspecified: Secondary | ICD-10-CM | POA: Diagnosis not present

## 2017-06-08 DIAGNOSIS — I129 Hypertensive chronic kidney disease with stage 1 through stage 4 chronic kidney disease, or unspecified chronic kidney disease: Secondary | ICD-10-CM | POA: Diagnosis not present

## 2017-07-23 DIAGNOSIS — H6122 Impacted cerumen, left ear: Secondary | ICD-10-CM | POA: Diagnosis not present

## 2017-07-23 DIAGNOSIS — Z85831 Personal history of malignant neoplasm of soft tissue: Secondary | ICD-10-CM | POA: Diagnosis not present

## 2017-08-11 DIAGNOSIS — Z96641 Presence of right artificial hip joint: Secondary | ICD-10-CM | POA: Diagnosis not present

## 2017-08-16 DIAGNOSIS — Z96649 Presence of unspecified artificial hip joint: Secondary | ICD-10-CM | POA: Insufficient documentation

## 2017-08-19 DIAGNOSIS — Z4420 Encounter for fitting and adjustment of artificial eye, unspecified: Secondary | ICD-10-CM | POA: Diagnosis not present

## 2017-08-25 DIAGNOSIS — E782 Mixed hyperlipidemia: Secondary | ICD-10-CM | POA: Diagnosis not present

## 2017-08-25 DIAGNOSIS — E119 Type 2 diabetes mellitus without complications: Secondary | ICD-10-CM | POA: Diagnosis not present

## 2017-08-25 DIAGNOSIS — I10 Essential (primary) hypertension: Secondary | ICD-10-CM | POA: Diagnosis not present

## 2017-08-25 DIAGNOSIS — R109 Unspecified abdominal pain: Secondary | ICD-10-CM | POA: Diagnosis not present

## 2017-08-27 ENCOUNTER — Other Ambulatory Visit: Payer: Self-pay | Admitting: Internal Medicine

## 2017-08-27 DIAGNOSIS — R109 Unspecified abdominal pain: Secondary | ICD-10-CM

## 2017-09-03 ENCOUNTER — Ambulatory Visit: Payer: PPO

## 2017-09-24 DIAGNOSIS — M170 Bilateral primary osteoarthritis of knee: Secondary | ICD-10-CM | POA: Diagnosis not present

## 2017-09-24 DIAGNOSIS — Z96649 Presence of unspecified artificial hip joint: Secondary | ICD-10-CM | POA: Diagnosis not present

## 2017-09-24 DIAGNOSIS — Z96641 Presence of right artificial hip joint: Secondary | ICD-10-CM | POA: Diagnosis not present

## 2017-10-12 DIAGNOSIS — E782 Mixed hyperlipidemia: Secondary | ICD-10-CM | POA: Diagnosis not present

## 2017-10-12 DIAGNOSIS — H401121 Primary open-angle glaucoma, left eye, mild stage: Secondary | ICD-10-CM | POA: Diagnosis not present

## 2017-10-12 DIAGNOSIS — E119 Type 2 diabetes mellitus without complications: Secondary | ICD-10-CM | POA: Diagnosis not present

## 2017-10-12 DIAGNOSIS — I10 Essential (primary) hypertension: Secondary | ICD-10-CM | POA: Diagnosis not present

## 2017-10-12 DIAGNOSIS — I4891 Unspecified atrial fibrillation: Secondary | ICD-10-CM | POA: Diagnosis not present

## 2017-10-12 DIAGNOSIS — N390 Urinary tract infection, site not specified: Secondary | ICD-10-CM | POA: Diagnosis not present

## 2017-10-14 DIAGNOSIS — I4891 Unspecified atrial fibrillation: Secondary | ICD-10-CM | POA: Diagnosis not present

## 2017-10-22 DIAGNOSIS — Z85831 Personal history of malignant neoplasm of soft tissue: Secondary | ICD-10-CM | POA: Diagnosis not present

## 2017-10-22 DIAGNOSIS — C07 Malignant neoplasm of parotid gland: Secondary | ICD-10-CM | POA: Diagnosis not present

## 2017-11-03 DIAGNOSIS — E782 Mixed hyperlipidemia: Secondary | ICD-10-CM | POA: Diagnosis not present

## 2017-11-03 DIAGNOSIS — I10 Essential (primary) hypertension: Secondary | ICD-10-CM | POA: Diagnosis not present

## 2017-11-03 DIAGNOSIS — N390 Urinary tract infection, site not specified: Secondary | ICD-10-CM | POA: Diagnosis not present

## 2017-11-03 DIAGNOSIS — E118 Type 2 diabetes mellitus with unspecified complications: Secondary | ICD-10-CM | POA: Diagnosis not present

## 2017-11-03 DIAGNOSIS — I509 Heart failure, unspecified: Secondary | ICD-10-CM | POA: Diagnosis not present

## 2017-11-26 DIAGNOSIS — E782 Mixed hyperlipidemia: Secondary | ICD-10-CM | POA: Diagnosis not present

## 2017-11-26 DIAGNOSIS — I10 Essential (primary) hypertension: Secondary | ICD-10-CM | POA: Diagnosis not present

## 2017-11-26 DIAGNOSIS — E119 Type 2 diabetes mellitus without complications: Secondary | ICD-10-CM | POA: Diagnosis not present

## 2017-12-03 ENCOUNTER — Ambulatory Visit
Admission: RE | Admit: 2017-12-03 | Discharge: 2017-12-03 | Disposition: A | Discharge status: Home / Self Care | Payer: PPO | Source: Ambulatory Visit | Attending: Radiation Oncology | Admitting: Radiation Oncology

## 2017-12-03 ENCOUNTER — Encounter: Payer: Self-pay | Admitting: Radiation Oncology

## 2017-12-03 ENCOUNTER — Other Ambulatory Visit: Payer: Self-pay

## 2017-12-03 VITALS — BP 111/68 | Wt 102.6 lb

## 2017-12-03 DIAGNOSIS — R200 Anesthesia of skin: Secondary | ICD-10-CM | POA: Insufficient documentation

## 2017-12-03 DIAGNOSIS — Z923 Personal history of irradiation: Secondary | ICD-10-CM | POA: Insufficient documentation

## 2017-12-03 DIAGNOSIS — Z8589 Personal history of malignant neoplasm of other organs and systems: Secondary | ICD-10-CM | POA: Diagnosis not present

## 2017-12-03 DIAGNOSIS — C07 Malignant neoplasm of parotid gland: Secondary | ICD-10-CM

## 2017-12-03 NOTE — Progress Notes (Signed)
Radiation Oncology Follow up Note  Name: Sarah Dougherty   Date:   12/03/2017 MRN:  [id number] DOB: 15-May-1921    This 84 y.o. female presents to the clinic today for a month follow-up status post external beam radiation therapy to her left neck and parotid bed status post thyroidectomy for metastatic squamous cell carcinoma.  REFERRING PROVIDER: Perrin Maltese, MD  HPI: patient is a 84 year old female now out 8 months having completed radiation therapy to her left parotidbed as well as left neck nodes for metastatic squamous cell carcinoma. Seen today in routine follow-up she is doing well. She continues to have some numbness which we would expect in the left face. She specifically denies head and neck pain or dysphagia she is starting to put on weight..  COMPLICATIONS OF TREATMENT: none  FOLLOW UP COMPLIANCE: keeps appointments   PHYSICAL EXAM:  BP 111/68 (BP Location: Left Arm, Patient Position: Sitting)   Wt 102 lb 10 oz (46.6 kg)   BMI 19.39 kg/m  No evidence of mass or nodularity in the left parotid bed is noted. Oral cavity is clear. No evidence of subject gastric cervical or supra clavicular adenopathy is appreciated.Well-developed well-nourished patient in NAD. HEENT reveals PERLA, EOMI, discs not visualized.  Oral cavity is clear. No oral mucosal lesions are identified. Neck is clear without evidence of cervical or supraclavicular adenopathy. Lungs are clear to A&P. Cardiac examination is essentially unremarkable with regular rate and rhythm without murmur rub or thrill. Abdomen is benign with no organomegaly or masses noted. Motor sensory and DTR levels are equal and symmetric in the upper and lower extremities. Cranial nerves II through XII are grossly intact. Proprioception is intact. No peripheral adenopathy or edema is identified. No motor or sensory levels are noted. Crude visual fields are within normal range.  RADIOLOGY RESULTS: no current films for review  PLAN: at the  present time she is doing well with no evidence of disease. She continues close follow-up care with ENT. I am please were overall progress. I've asked to see her back in 6 months for follow-up. Patient family know to call with any concerns at any time.  I would like to take this opportunity to thank you for allowing me to participate in the care of your patient.Noreene Filbert, MD

## 2017-12-03 NOTE — Addendum Note (Signed)
Encounter addended by: Noreene Filbert, MD on: 12/03/2017 2:09 PM  Actions taken: LOS modified

## 2017-12-08 DIAGNOSIS — K30 Functional dyspepsia: Secondary | ICD-10-CM | POA: Diagnosis not present

## 2017-12-08 DIAGNOSIS — E782 Mixed hyperlipidemia: Secondary | ICD-10-CM | POA: Diagnosis not present

## 2017-12-08 DIAGNOSIS — I10 Essential (primary) hypertension: Secondary | ICD-10-CM | POA: Diagnosis not present

## 2017-12-08 DIAGNOSIS — E119 Type 2 diabetes mellitus without complications: Secondary | ICD-10-CM | POA: Diagnosis not present

## 2017-12-10 ENCOUNTER — Emergency Department
Admission: EM | Admit: 2017-12-10 | Discharge: 2017-12-10 | Disposition: A | Discharge status: Home / Self Care | Payer: PPO | Attending: Emergency Medicine | Admitting: Emergency Medicine

## 2017-12-10 ENCOUNTER — Encounter: Payer: Self-pay | Admitting: Emergency Medicine

## 2017-12-10 DIAGNOSIS — Z85818 Personal history of malignant neoplasm of other sites of lip, oral cavity, and pharynx: Secondary | ICD-10-CM | POA: Diagnosis not present

## 2017-12-10 DIAGNOSIS — Z79899 Other long term (current) drug therapy: Secondary | ICD-10-CM | POA: Insufficient documentation

## 2017-12-10 DIAGNOSIS — E860 Dehydration: Secondary | ICD-10-CM | POA: Insufficient documentation

## 2017-12-10 DIAGNOSIS — Z85828 Personal history of other malignant neoplasm of skin: Secondary | ICD-10-CM | POA: Diagnosis not present

## 2017-12-10 DIAGNOSIS — B373 Candidiasis of vulva and vagina: Secondary | ICD-10-CM | POA: Insufficient documentation

## 2017-12-10 DIAGNOSIS — Z7901 Long term (current) use of anticoagulants: Secondary | ICD-10-CM | POA: Diagnosis not present

## 2017-12-10 DIAGNOSIS — Z96641 Presence of right artificial hip joint: Secondary | ICD-10-CM | POA: Diagnosis not present

## 2017-12-10 DIAGNOSIS — I2510 Atherosclerotic heart disease of native coronary artery without angina pectoris: Secondary | ICD-10-CM | POA: Diagnosis not present

## 2017-12-10 DIAGNOSIS — E1122 Type 2 diabetes mellitus with diabetic chronic kidney disease: Secondary | ICD-10-CM | POA: Diagnosis not present

## 2017-12-10 DIAGNOSIS — J45909 Unspecified asthma, uncomplicated: Secondary | ICD-10-CM | POA: Insufficient documentation

## 2017-12-10 DIAGNOSIS — N189 Chronic kidney disease, unspecified: Secondary | ICD-10-CM | POA: Diagnosis not present

## 2017-12-10 DIAGNOSIS — R110 Nausea: Secondary | ICD-10-CM | POA: Diagnosis not present

## 2017-12-10 DIAGNOSIS — B3731 Acute candidiasis of vulva and vagina: Secondary | ICD-10-CM

## 2017-12-10 DIAGNOSIS — Z7984 Long term (current) use of oral hypoglycemic drugs: Secondary | ICD-10-CM | POA: Diagnosis not present

## 2017-12-10 DIAGNOSIS — Z950 Presence of cardiac pacemaker: Secondary | ICD-10-CM | POA: Diagnosis not present

## 2017-12-10 DIAGNOSIS — R531 Weakness: Secondary | ICD-10-CM | POA: Diagnosis not present

## 2017-12-10 DIAGNOSIS — I129 Hypertensive chronic kidney disease with stage 1 through stage 4 chronic kidney disease, or unspecified chronic kidney disease: Secondary | ICD-10-CM | POA: Diagnosis not present

## 2017-12-10 LAB — URINALYSIS, COMPLETE (UACMP) WITH MICROSCOPIC
Bacteria, UA: NONE SEEN
Bilirubin Urine: NEGATIVE
Ketones, ur: NEGATIVE mg/dL
Leukocytes, UA: NEGATIVE
NITRITE: NEGATIVE
PROTEIN: NEGATIVE mg/dL
Specific Gravity, Urine: 1.029 (ref 1.005–1.030)
pH: 5 (ref 5.0–8.0)

## 2017-12-10 LAB — BASIC METABOLIC PANEL
ANION GAP: 10 (ref 5–15)
ANION GAP: 10 (ref 5–15)
BUN: 38 mg/dL — AB (ref 8–23)
BUN: 33 mg/dL — ABNORMAL HIGH (ref 8–23)
CALCIUM: 9.1 mg/dL (ref 8.9–10.3)
CO2: 27 mmol/L (ref 22–32)
CO2: 24 mmol/L (ref 22–32)
CREATININE: 1.24 mg/dL — AB (ref 0.44–1.00)
Calcium: 8.3 mg/dL — ABNORMAL LOW (ref 8.9–10.3)
Chloride: 103 mmol/L (ref 98–111)
Chloride: 107 mmol/L (ref 98–111)
Creatinine, Ser: 0.93 mg/dL (ref 0.44–1.00)
GFR calc Af Amer: 42 mL/min — ABNORMAL LOW (ref >60)
GFR calc Af Amer: 59 mL/min — ABNORMAL LOW (ref >60)
GFR, EST NON AFRICAN AMERICAN: 36 mL/min — AB (ref >60)
GFR, EST NON AFRICAN AMERICAN: 51 mL/min — AB (ref >60)
GLUCOSE: 223 mg/dL — AB (ref 70–99)
Glucose, Bld: 100 mg/dL — ABNORMAL HIGH (ref 70–99)
POTASSIUM: 3.7 mmol/L (ref 3.5–5.1)
Potassium: 4.6 mmol/L (ref 3.5–5.1)
SODIUM: 141 mmol/L (ref 135–145)
Sodium: 140 mmol/L (ref 135–145)

## 2017-12-10 LAB — CBC
HCT: 39.8 % (ref 35.0–47.0)
HEMOGLOBIN: 13.2 g/dL (ref 12.0–16.0)
MCH: 29.6 pg (ref 26.0–34.0)
MCHC: 33.1 g/dL (ref 32.0–36.0)
MCV: 89.6 fL (ref 80.0–100.0)
Platelets: 288 10*3/uL (ref 150–440)
RBC: 4.44 MIL/uL (ref 3.80–5.20)
RDW: 16.8 % — AB (ref 11.5–14.5)
WBC: 13.6 10*3/uL — ABNORMAL HIGH (ref 3.6–11.0)

## 2017-12-10 MED ORDER — NYSTATIN 100000 UNIT/GM EX POWD
Freq: Once | CUTANEOUS | Status: AC
  Administered 2017-12-10: 14:00:00 via TOPICAL
  Filled 2017-12-10: qty 15

## 2017-12-10 MED ORDER — SODIUM CHLORIDE 0.9 % IV BOLUS
1000.0000 mL | Freq: Once | INTRAVENOUS | Status: AC
  Administered 2017-12-10: 1000 mL via INTRAVENOUS

## 2017-12-10 MED ORDER — NYSTATIN 100000 UNIT/GM EX POWD: Freq: Four times a day (QID) | CUTANEOUS | 0 refills | Status: DC

## 2017-12-10 NOTE — ED Triage Notes (Signed)
Pt family reports that last week the patient was seen at her MD office for abdominal pain. Family reports MD said she didn't see anything and prescribed her medication for nausea. Family reports pt has eaten and drank less so they called her MD back today and was told that she was dehydrated but they were too busy to give fluids so to come to the ED. Pt denies pain today, reports feels weak. Today was last BM and pt states that it was a little hard to get out.

## 2017-12-10 NOTE — ED Notes (Signed)
ED Provider at bedside. 

## 2017-12-10 NOTE — ED Provider Notes (Addendum)
[facility] Emergency Department Provider Note  ____________________________________________  Time seen: Approximately 1:19 PM  I have reviewed the triage vital signs and the nursing notes.   HISTORY  Chief Complaint Weakness   HPI Sarah Dougherty is a 84 y.o. female with history as listed below who presents for evaluation of generalized weakness.  History is gathered from patient and her son who was at the bedside.  Patient had nausea and abdominal pain the beginning of the week.  Saw her primary care doctor and was given Zofran.  She reports that the nausea and abdominal pain have resolved.  However son reports decreased oral intake throughout the week.  Since yesterday patient has been very weak and having difficulty walking.  They called the PCP again today who recommended bring her to the emergency room to evaluate for dehydration.  Patient denies abdominal pain, vomiting, diarrhea, she had a normal bowel movement this morning.  No cough, shortness of breath or chest pain.  No fever or chills.  She is complaining of burning with urination.  Past Medical History:  Diagnosis Date  . A-fib ([place])   . Arthritis    Osteoarthritis  . Asthma   . Chronic kidney disease    UTI  . COPD (chronic obstructive pulmonary disease) ([place])   . Coronary artery disease   . Diabetes mellitus without complication ([place])   . GERD (gastroesophageal reflux disease)   . Glaucoma (increased eye pressure)   . HOH (hard of hearing)    Left Hearing Aid  . Hypertension   . Hyperthyroidism   . On home oxygen therapy    uses at night  . Presence of permanent cardiac pacemaker   . Sleep apnea    No C-PAP    Patient Active Problem List   Diagnosis Date Noted  . Hip fx ([place]) 04/03/2017  . 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 COPD exacerbation ([place]) 04/07/2016  . Squamous cell carcinoma 03/19/2016  . Bradycardia 12/10/2014  . COPD (chronic obstructive  pulmonary disease) ([place]) 12/09/2014    Past Surgical History:  Procedure Laterality Date  . ABDOMINAL HYSTERECTOMY    . EYE SURGERY Left    Cataract Extraction with IOL  . EYE SURGERY Right    Artificial Eye  . HIP ARTHROPLASTY Right 04/07/2017   Procedure: ARTHROPLASTY BIPOLAR HIP (HEMIARTHROPLASTY);  Surgeon: Dereck Leep, MD;  Location: [facility] ORS;  Service: Orthopedics;  Laterality: Right;  . INSERT / REPLACE / REMOVE PACEMAKER    . PACEMAKER INSERTION Left 12/12/2014   Procedure: INSERTION PACEMAKER;  Surgeon: Isaias Cowman, MD;  Location: [facility] ORS;  Service: Cardiovascular;  Laterality: Left;  . PAROTIDECTOMY Left 12/10/2016   Procedure: PAROTIDECTOMY;  Surgeon: Clyde Canterbury, MD;  Location: [facility] ORS;  Service: ENT;  Laterality: Left;  . RADICAL NECK DISSECTION Left 12/10/2016   Procedure: RADICAL NECK DISSECTION;  Surgeon: Clyde Canterbury, MD;  Location: [facility] ORS;  Service: ENT;  Laterality: Left;    Prior to Admission medications   Medication Sig Start Date End Date Taking? Authorizing Provider  acetaminophen (TYLENOL 8 HOUR) 650 MG CR tablet Take 650 mg by mouth every 8 (eight) hours as needed for pain.    [provider]  albuterol (PROAIR HFA) 108 (90 Base) MCG/ACT inhaler Inhale 1 puff into the lungs every 6 (six) hours as needed for wheezing or shortness of breath.    [provider]  amiodarone (PACERONE) 200 MG tablet Take 100 mg by mouth  daily.    [provider]  amLODipine (NORVASC) 5 MG tablet Take 5 mg by mouth daily. 11/13/14   [provider]  apixaban (ELIQUIS) 5 MG TABS tablet Take 2.5 mg by mouth 2 (two) times daily.    [provider]  atorvastatin (LIPITOR) 20 MG tablet Take 20 mg by mouth daily.    [provider]  brimonidine (ALPHAGAN) 0.2 % ophthalmic solution Place 1 drop into the left eye 2 (two) times daily.    [provider]  Cholecalciferol (VITAMIN D) 2000 units tablet Take 2,000 Units  by mouth daily.    [provider]  clotrimazole (GYNE-LOTRIMIN) 1 % vaginal cream Place 1 application vaginally daily as needed. For yeast infection. 11/15/14   [provider]  fluconazole (DIFLUCAN) 100 MG tablet Take 1 tablet (100 mg total) by mouth daily. 05/18/17   Noreene Filbert, MD  glimepiride (AMARYL) 2 MG tablet Take 2 mg by mouth daily. 10/18/14   [provider]  GUAIFENESIN 1200 PO Take by mouth. Once a day in am    [provider]  hydrocortisone cream (NEOSPORIN ECZEMA ESSENT MAX ST) 1 % Apply 1 application topically 2 (two) times daily as needed for itching (for sores.).    [provider]  LORazepam (ATIVAN) 0.5 MG tablet Take 0.5 mg by mouth 2 (two) times daily.    [provider]  nystatin (MYCOSTATIN/NYSTOP) powder Apply topically 4 (four) times daily. 12/10/17   Rudene Re, MD  oxyCODONE (OXY IR/ROXICODONE) 5 MG immediate release tablet Take 0.5-1 tablets (2.5-5 mg total) by mouth every 4 (four) hours as needed for moderate pain or severe pain. Patient not taking: Reported on 05/05/2017 04/09/17   Hillary Bow, MD  sodium chloride (OCEAN) 0.65 % nasal spray Place 2 sprays into the nose daily as needed. 09/14/14   [provider]  travoprost, benzalkonium, (TRAVATAN) 0.004 % ophthalmic solution Place 1 drop into the left eye at bedtime.    [provider]    Allergies Amoxicillin; Cephalexin; Nitrofurantoin; Penicillins; Sulfa antibiotics; Atorvastatin; and Azithromycin  Family History  Problem Relation Age of Onset  . Diabetes Brother   . Diabetes Son   . Stroke Mother   . Cancer Father   . Stroke Sister   . Stroke Sister   . COPD Brother   . Diabetes Son     Social History Social History   Tobacco Use  . Smoking status: Never Smoker  . Smokeless tobacco: Never Used  Substance Use Topics  . Alcohol use: No  . Drug use: No    Review of Systems  Constitutional: Negative for fever. +  Generalized weakness Eyes: Negative for visual changes. ENT: Negative for sore throat. Neck: No neck pain  Cardiovascular: Negative for chest pain. Respiratory: Negative for shortness of breath. Gastrointestinal: +abdominal pain and nausea. No vomiting or diarrhea. Genitourinary: Negative for dysuria. Musculoskeletal: Negative for back pain. Skin: Negative for rash. Neurological: Negative for headaches, weakness or numbness. Psych: No SI or HI  ____________________________________________   PHYSICAL EXAM:  VITAL SIGNS: ED Triage Vitals [12/10/17 1015]  Enc Vitals Group     BP (!) 95/56     Pulse Rate (!) 59     Resp 18     Temp 98.76F     Temp src      SpO2 94 %     Weight 102 lb (46.3 kg)     Height 5\' 3"  (1.6 m)     Head Circumference  Peak Flow      Pain Score 0     Pain Loc      Pain Edu?      Excl. in [place]?     Constitutional: Alert and oriented, no apparent distress. HEENT:      Head: Normocephalic and atraumatic.         Eyes: Conjunctivae are normal. Sclera is non-icteric.       Mouth/Throat: Mucous membranes are moist.       Neck: Supple with no signs of meningismus. Cardiovascular: Regular rate and rhythm. No murmurs, gallops, or rubs. 2+ symmetrical distal pulses are present in all extremities. No JVD. Respiratory: Normal respiratory effort. Lungs are clear to auscultation bilaterally. No wheezes, crackles, or rhonchi.  Gastrointestinal: Soft, non tender, and non distended with positive bowel sounds. No rebound or guarding. Musculoskeletal: Nontender with normal range of motion in all extremities. No edema, cyanosis, or erythema of extremities. Neurologic: Normal speech and language. Face is symmetric. Moving all extremities.  Intact strength and sensation all 4 extremities. no gross focal neurologic deficits are appreciated. Skin: Skin is warm, dry and intact. No rash noted. Psychiatric: Mood and affect are normal. Speech and behavior are  normal.  ____________________________________________   LABS (all labs ordered are listed, but only abnormal results are displayed)  Labs Reviewed  BASIC METABOLIC PANEL - Abnormal; Notable for the following components:      Result Value   Glucose, Bld 223 (*)    BUN 38 (*)    Creatinine, Ser 1.24 (*)    GFR calc non Af Amer 36 (*)    GFR calc Af Amer 42 (*)    All other components within normal limits  CBC - Abnormal; Notable for the following components:   WBC 13.6 (*)    RDW 16.8 (*)    All other components within normal limits  URINALYSIS, COMPLETE (UACMP) WITH MICROSCOPIC - Abnormal; Notable for the following components:   Color, Urine YELLOW (*)    APPearance CLEAR (*)    Glucose, UA >=500 (*)    Hgb urine dipstick SMALL (*)    All other components within normal limits  BASIC METABOLIC PANEL - Abnormal; Notable for the following components:   Glucose, Bld 100 (*)    BUN 33 (*)    Calcium 8.3 (*)    GFR calc non Af Amer 51 (*)    GFR calc Af Amer 59 (*)    All other components within normal limits  CBG MONITORING, ED   ____________________________________________  EKG  ED ECG REPORT I, Rudene Re, the attending physician, personally viewed and interpreted this ECG.  Atrial paced rhythm, rate of 60, left axis deviation, diffuse T wave flattening, no ST elevations or depressions.  Unchanged from prior. ____________________________________________  RADIOLOGY  none  ____________________________________________   PROCEDURES  Procedure(s) performed: None Procedures Critical Care performed:  None ____________________________________________   INITIAL IMPRESSION / ASSESSMENT AND PLAN / ED COURSE  84 y.o. female with history as listed below who presents for evaluation of generalized weakness in the setting of abdominal pain, nausea, and decreased PO intake.  Patient has had no abdominal pain or nausea for the last 3 to 4 days.  She is afebrile, her  vitals show blood pressure of 95/56, no tachycardia, afebrile, abdomen is soft with no tenderness, lungs are clear to auscultation.  Patient does have an yeast infection in her vagina.  We will do an I&O urinalysis to determine if patient's dysuria is from  the yeast infection versus a UTI.  Will give IV fluids as patient looks slightly dry on exam.  Anticipate discharge.  Clinical Course as of Dec 10 1528  Thu Dec 10, 2017  1449 Patient's last creatinine in 2018 was 0.7 and today 1.24, patient has had elevated creatinine in 2018 but no recent labs. She was given 1L NS and I will repeat her BMP.  If creatinine is improving plan to discharge home since patient feels improved with close follow-up with primary care doctor.  Otherwise will admit patient for IV hydration. Care transferred to Dr. Karma Greaser   [CV]    Clinical Course User Index [CV] Alfred Levins, [place], MD   _________________________ 3:30 PM on 12/10/2017 -----------------------------------------  Repeat creatinine 0.9 patient will be dc home with close f/u with PCP.   As part of my medical decision making, I reviewed the following data within the [place] History obtained from family, Nursing notes reviewed and incorporated, Labs reviewed , EKG interpreted , Old EKG reviewed, Notes from prior ED visits and [place] Controlled Substance Database    Pertinent labs & imaging results that were available during my care of the patient were reviewed by me and considered in my medical decision making (see chart for details).    ____________________________________________   FINAL CLINICAL IMPRESSION(S) / ED DIAGNOSES  Final diagnoses:  Dehydration  Yeast infection involving the vagina and surrounding area      NEW MEDICATIONS STARTED DURING THIS VISIT:  ED Discharge Orders         Ordered    nystatin (MYCOSTATIN/NYSTOP) powder  4 times daily     12/10/17 1448           Note:  This document was prepared using  Dragon voice recognition software and may include unintentional dictation errors.    Alfred Levins, [place], MD 12/10/17 [place], [place], MD 12/10/17 [phone]

## 2017-12-10 NOTE — ED Notes (Addendum)
First Nurse Note: Patient ambulatory with family member who states that the patient has been seen by Dr. Chancy Milroy earlier in the week for nausea and is here today with possible dehydration.  Patient is alert and oriented, walking with cane, placed in WC.

## 2017-12-21 DIAGNOSIS — I10 Essential (primary) hypertension: Secondary | ICD-10-CM | POA: Diagnosis not present

## 2017-12-21 DIAGNOSIS — Z23 Encounter for immunization: Secondary | ICD-10-CM | POA: Diagnosis not present

## 2017-12-21 DIAGNOSIS — E782 Mixed hyperlipidemia: Secondary | ICD-10-CM | POA: Diagnosis not present

## 2017-12-21 DIAGNOSIS — N760 Acute vaginitis: Secondary | ICD-10-CM | POA: Diagnosis not present

## 2017-12-21 DIAGNOSIS — N952 Postmenopausal atrophic vaginitis: Secondary | ICD-10-CM | POA: Diagnosis not present

## 2017-12-21 DIAGNOSIS — E1121 Type 2 diabetes mellitus with diabetic nephropathy: Secondary | ICD-10-CM | POA: Diagnosis not present

## 2018-01-05 ENCOUNTER — Other Ambulatory Visit: Payer: Self-pay

## 2018-01-05 ENCOUNTER — Emergency Department: Payer: PPO

## 2018-01-05 ENCOUNTER — Encounter: Payer: Self-pay | Admitting: Medical Oncology

## 2018-01-05 ENCOUNTER — Inpatient Hospital Stay
Admission: EM | Admit: 2018-01-05 | Discharge: 2018-01-08 | DRG: 440 | Disposition: A | Discharge status: Home / Self Care | Payer: PPO | Attending: Internal Medicine | Admitting: Internal Medicine

## 2018-01-05 DIAGNOSIS — Z823 Family history of stroke: Secondary | ICD-10-CM | POA: Diagnosis not present

## 2018-01-05 DIAGNOSIS — Z882 Allergy status to sulfonamides status: Secondary | ICD-10-CM

## 2018-01-05 DIAGNOSIS — J441 Chronic obstructive pulmonary disease with (acute) exacerbation: Secondary | ICD-10-CM | POA: Diagnosis not present

## 2018-01-05 DIAGNOSIS — Z961 Presence of intraocular lens: Secondary | ICD-10-CM | POA: Diagnosis not present

## 2018-01-05 DIAGNOSIS — Z8679 Personal history of other diseases of the circulatory system: Secondary | ICD-10-CM | POA: Diagnosis not present

## 2018-01-05 DIAGNOSIS — Z880 Allergy status to penicillin: Secondary | ICD-10-CM

## 2018-01-05 DIAGNOSIS — M1990 Unspecified osteoarthritis, unspecified site: Secondary | ICD-10-CM | POA: Diagnosis not present

## 2018-01-05 DIAGNOSIS — Z79899 Other long term (current) drug therapy: Secondary | ICD-10-CM

## 2018-01-05 DIAGNOSIS — H409 Unspecified glaucoma: Secondary | ICD-10-CM | POA: Diagnosis not present

## 2018-01-05 DIAGNOSIS — H9190 Unspecified hearing loss, unspecified ear: Secondary | ICD-10-CM | POA: Diagnosis not present

## 2018-01-05 DIAGNOSIS — E1122 Type 2 diabetes mellitus with diabetic chronic kidney disease: Secondary | ICD-10-CM | POA: Diagnosis not present

## 2018-01-05 DIAGNOSIS — E119 Type 2 diabetes mellitus without complications: Secondary | ICD-10-CM | POA: Diagnosis not present

## 2018-01-05 DIAGNOSIS — K858 Other acute pancreatitis without necrosis or infection: Secondary | ICD-10-CM

## 2018-01-05 DIAGNOSIS — Z809 Family history of malignant neoplasm, unspecified: Secondary | ICD-10-CM

## 2018-01-05 DIAGNOSIS — Z90710 Acquired absence of both cervix and uterus: Secondary | ICD-10-CM

## 2018-01-05 DIAGNOSIS — Z8589 Personal history of malignant neoplasm of other organs and systems: Secondary | ICD-10-CM

## 2018-01-05 DIAGNOSIS — Z9981 Dependence on supplemental oxygen: Secondary | ICD-10-CM | POA: Diagnosis not present

## 2018-01-05 DIAGNOSIS — Z7901 Long term (current) use of anticoagulants: Secondary | ICD-10-CM

## 2018-01-05 DIAGNOSIS — I2510 Atherosclerotic heart disease of native coronary artery without angina pectoris: Secondary | ICD-10-CM | POA: Diagnosis present

## 2018-01-05 DIAGNOSIS — E0590 Thyrotoxicosis, unspecified without thyrotoxic crisis or storm: Secondary | ICD-10-CM | POA: Diagnosis present

## 2018-01-05 DIAGNOSIS — I4891 Unspecified atrial fibrillation: Secondary | ICD-10-CM | POA: Diagnosis not present

## 2018-01-05 DIAGNOSIS — K859 Acute pancreatitis without necrosis or infection, unspecified: Secondary | ICD-10-CM | POA: Diagnosis not present

## 2018-01-05 DIAGNOSIS — N182 Chronic kidney disease, stage 2 (mild): Secondary | ICD-10-CM | POA: Diagnosis not present

## 2018-01-05 DIAGNOSIS — K319 Disease of stomach and duodenum, unspecified: Secondary | ICD-10-CM | POA: Diagnosis not present

## 2018-01-05 DIAGNOSIS — I482 Chronic atrial fibrillation: Secondary | ICD-10-CM | POA: Diagnosis not present

## 2018-01-05 DIAGNOSIS — E785 Hyperlipidemia, unspecified: Secondary | ICD-10-CM | POA: Diagnosis present

## 2018-01-05 DIAGNOSIS — I10 Essential (primary) hypertension: Secondary | ICD-10-CM | POA: Diagnosis not present

## 2018-01-05 DIAGNOSIS — K2980 Duodenitis without bleeding: Secondary | ICD-10-CM | POA: Diagnosis present

## 2018-01-05 DIAGNOSIS — K449 Diaphragmatic hernia without obstruction or gangrene: Secondary | ICD-10-CM | POA: Diagnosis present

## 2018-01-05 DIAGNOSIS — G4730 Sleep apnea, unspecified: Secondary | ICD-10-CM | POA: Diagnosis not present

## 2018-01-05 DIAGNOSIS — Z950 Presence of cardiac pacemaker: Secondary | ICD-10-CM

## 2018-01-05 DIAGNOSIS — Z888 Allergy status to other drugs, medicaments and biological substances status: Secondary | ICD-10-CM

## 2018-01-05 DIAGNOSIS — Z9842 Cataract extraction status, left eye: Secondary | ICD-10-CM

## 2018-01-05 DIAGNOSIS — E782 Mixed hyperlipidemia: Secondary | ICD-10-CM | POA: Diagnosis not present

## 2018-01-05 DIAGNOSIS — Z833 Family history of diabetes mellitus: Secondary | ICD-10-CM

## 2018-01-05 DIAGNOSIS — Z825 Family history of asthma and other chronic lower respiratory diseases: Secondary | ICD-10-CM

## 2018-01-05 DIAGNOSIS — K3189 Other diseases of stomach and duodenum: Secondary | ICD-10-CM

## 2018-01-05 DIAGNOSIS — Z881 Allergy status to other antibiotic agents status: Secondary | ICD-10-CM

## 2018-01-05 DIAGNOSIS — K7689 Other specified diseases of liver: Secondary | ICD-10-CM | POA: Diagnosis not present

## 2018-01-05 DIAGNOSIS — R109 Unspecified abdominal pain: Secondary | ICD-10-CM

## 2018-01-05 DIAGNOSIS — Z9841 Cataract extraction status, right eye: Secondary | ICD-10-CM

## 2018-01-05 DIAGNOSIS — I129 Hypertensive chronic kidney disease with stage 1 through stage 4 chronic kidney disease, or unspecified chronic kidney disease: Secondary | ICD-10-CM | POA: Diagnosis not present

## 2018-01-05 DIAGNOSIS — C170 Malignant neoplasm of duodenum: Secondary | ICD-10-CM | POA: Diagnosis not present

## 2018-01-05 DIAGNOSIS — J449 Chronic obstructive pulmonary disease, unspecified: Secondary | ICD-10-CM | POA: Diagnosis not present

## 2018-01-05 DIAGNOSIS — K219 Gastro-esophageal reflux disease without esophagitis: Secondary | ICD-10-CM | POA: Diagnosis present

## 2018-01-05 DIAGNOSIS — N189 Chronic kidney disease, unspecified: Secondary | ICD-10-CM | POA: Diagnosis not present

## 2018-01-05 LAB — URINALYSIS, COMPLETE (UACMP) WITH MICROSCOPIC
BILIRUBIN URINE: NEGATIVE
Bacteria, UA: NONE SEEN
Glucose, UA: >=500 mg/dL — AB
HGB URINE DIPSTICK: NEGATIVE
KETONES UR: NEGATIVE mg/dL
NITRITE: NEGATIVE
PH: 5 (ref 5.0–8.0)
Protein, ur: NEGATIVE mg/dL
Specific Gravity, Urine: 1.016 (ref 1.005–1.030)

## 2018-01-05 LAB — COMPREHENSIVE METABOLIC PANEL
ALK PHOS: 78 U/L (ref 38–126)
ALT: 15 U/L (ref 0–44)
ANION GAP: 11 (ref 5–15)
AST: 15 U/L (ref 15–41)
Albumin: 4 g/dL (ref 3.5–5.0)
BUN: 24 mg/dL — ABNORMAL HIGH (ref 8–23)
CO2: 23 mmol/L (ref 22–32)
Calcium: 9.1 mg/dL (ref 8.9–10.3)
Chloride: 102 mmol/L (ref 98–111)
Creatinine, Ser: 1.07 mg/dL — ABNORMAL HIGH (ref 0.44–1.00)
GFR calc non Af Amer: 43 mL/min — ABNORMAL LOW (ref >60)
GFR, EST AFRICAN AMERICAN: 50 mL/min — AB (ref >60)
Glucose, Bld: 250 mg/dL — ABNORMAL HIGH (ref 70–99)
POTASSIUM: 4.6 mmol/L (ref 3.5–5.1)
SODIUM: 136 mmol/L (ref 135–145)
TOTAL PROTEIN: 7.2 g/dL (ref 6.5–8.1)
Total Bilirubin: 1.5 mg/dL — ABNORMAL HIGH (ref 0.3–1.2)

## 2018-01-05 LAB — LIPASE, BLOOD: Lipase: 628 U/L — ABNORMAL HIGH (ref 11–51)

## 2018-01-05 LAB — CBC
HEMATOCRIT: 40.4 % (ref 35.0–47.0)
HEMOGLOBIN: 13.3 g/dL (ref 12.0–16.0)
MCH: 29.7 pg (ref 26.0–34.0)
MCHC: 32.8 g/dL (ref 32.0–36.0)
MCV: 90.3 fL (ref 80.0–100.0)
Platelets: 313 10*3/uL (ref 150–440)
RBC: 4.47 MIL/uL (ref 3.80–5.20)
RDW: 16 % — ABNORMAL HIGH (ref 11.5–14.5)
WBC: 12.4 10*3/uL — ABNORMAL HIGH (ref 3.6–11.0)

## 2018-01-05 LAB — GLUCOSE, CAPILLARY: GLUCOSE-CAPILLARY: 122 mg/dL — AB (ref 70–99)

## 2018-01-05 MED ORDER — SODIUM CHLORIDE 0.9 % IV SOLN
INTRAVENOUS | Status: DC
  Administered 2018-01-05 – 2018-01-07 (×3): via INTRAVENOUS

## 2018-01-05 MED ORDER — ACETAMINOPHEN 325 MG PO TABS: 650.0000 mg | ORAL_TABLET | Freq: Four times a day (QID) | ORAL | Status: DC | PRN

## 2018-01-05 MED ORDER — IOHEXOL 300 MG/ML  SOLN
75.0000 mL | Freq: Once | INTRAMUSCULAR | Status: AC | PRN
  Administered 2018-01-05: 75 mL via INTRAVENOUS

## 2018-01-05 MED ORDER — OXYCODONE HCL 5 MG PO TABS: 5.0000 mg | ORAL_TABLET | Freq: Four times a day (QID) | ORAL | Status: DC | PRN

## 2018-01-05 MED ORDER — ACETAMINOPHEN 650 MG RE SUPP: 650.0000 mg | Freq: Four times a day (QID) | RECTAL | Status: DC | PRN

## 2018-01-05 MED ORDER — IOPAMIDOL (ISOVUE-300) INJECTION 61%
15.0000 mL | INTRAVENOUS | Status: AC
  Administered 2018-01-05: 30 mL via ORAL

## 2018-01-05 MED ORDER — MORPHINE SULFATE (PF) 2 MG/ML IV SOLN
1.0000 mg | INTRAVENOUS | Status: DC | PRN
  Administered 2018-01-08: 02:00:00 1 mg via INTRAVENOUS
  Filled 2018-01-05: qty 1

## 2018-01-05 MED ORDER — SODIUM CHLORIDE 0.9 % IV SOLN
1000.0000 mL | Freq: Once | INTRAVENOUS | Status: AC
  Administered 2018-01-05: 1000 mL via INTRAVENOUS

## 2018-01-05 MED ORDER — ONDANSETRON HCL 4 MG/2ML IJ SOLN
4.0000 mg | Freq: Once | INTRAMUSCULAR | Status: AC
  Administered 2018-01-05: 4 mg via INTRAVENOUS
  Filled 2018-01-05: qty 2

## 2018-01-05 MED ORDER — MORPHINE SULFATE (PF) 2 MG/ML IV SOLN
2.0000 mg | Freq: Once | INTRAVENOUS | Status: AC
  Administered 2018-01-05: 2 mg via INTRAVENOUS
  Filled 2018-01-05: qty 1

## 2018-01-05 NOTE — H&P (Signed)
[facility] NAME: Sarah Dougherty    MR#:  [id number]  DATE OF BIRTH:  05-Apr-1922  DATE OF ADMISSION:  01/05/2018  PRIMARY CARE PHYSICIAN: Perrin Maltese, MD   REQUESTING/REFERRING PHYSICIAN: Dr Lavonia Drafts  CHIEF COMPLAINT:   Chief Complaint  Patient presents with  . Abdominal Pain    HISTORY OF PRESENT ILLNESS:  Sarah Dougherty  is a 84 y.o. female presents with abdominal pain.  She states the pain started last night.  She was eating a lot of tomatoes so they thought it could be a diverticulitis.  Pain worsened this morning.  Was 7-8 out of 10 in intensity and now constant.  No nausea vomiting.  Some diarrhea last week.  No fever.  Positive for weight loss.  They went to their physician and then they decided to come to the ER for further evaluation.  In the ER, she was found to have a elevated lipase and CT scan showed a mass in the second portion of the duodenum.  Hospitalist services were contacted for further evaluation.  PAST MEDICAL HISTORY:   Past Medical History:  Diagnosis Date  . A-fib ([place])   . Arthritis    Osteoarthritis  . Asthma   . Chronic kidney disease    UTI  . COPD (chronic obstructive pulmonary disease) ([place])   . Coronary artery disease   . Diabetes mellitus without complication ([place])   . GERD (gastroesophageal reflux disease)   . Glaucoma (increased eye pressure)   . HOH (hard of hearing)    Left Hearing Aid  . Hypertension   . Hyperthyroidism   . On home oxygen therapy    uses at night  . Presence of permanent cardiac pacemaker   . Sleep apnea    No C-PAP    PAST SURGICAL HISTORY:   Past Surgical History:  Procedure Laterality Date  . ABDOMINAL HYSTERECTOMY    . EYE SURGERY Left    Cataract Extraction with IOL  . EYE SURGERY Right    Artificial Eye  . HIP ARTHROPLASTY Right 04/07/2017   Procedure: ARTHROPLASTY BIPOLAR HIP (HEMIARTHROPLASTY);  Surgeon: Dereck Leep, MD;   Location: [facility] ORS;  Service: Orthopedics;  Laterality: Right;  . INSERT / REPLACE / REMOVE PACEMAKER    . PACEMAKER INSERTION Left 12/12/2014   Procedure: INSERTION PACEMAKER;  Surgeon: Isaias Cowman, MD;  Location: [facility] ORS;  Service: Cardiovascular;  Laterality: Left;  . PAROTIDECTOMY Left 12/10/2016   Procedure: PAROTIDECTOMY;  Surgeon: Clyde Canterbury, MD;  Location: [facility] ORS;  Service: ENT;  Laterality: Left;  . RADICAL NECK DISSECTION Left 12/10/2016   Procedure: RADICAL NECK DISSECTION;  Surgeon: Clyde Canterbury, MD;  Location: [facility] ORS;  Service: ENT;  Laterality: Left;    SOCIAL HISTORY:   Social History   Tobacco Use  . Smoking status: Never Smoker  . Smokeless tobacco: Never Used  Substance Use Topics  . Alcohol use: No    FAMILY HISTORY:   Family History  Problem Relation Age of Onset  . Diabetes Brother   . Diabetes Son   . Stroke Mother   . Cancer Father   . Stroke Sister   . Stroke Sister   . COPD Brother   . Diabetes Son     DRUG ALLERGIES:   Allergies  Allergen Reactions  . Amoxicillin Other (See Comments)    unknown  . Cephalexin Other (See Comments)    unknown  . Nitrofurantoin Other (See Comments)  Unknown  . Penicillins Other (See Comments)    Other reaction(s): Unknown  . Sulfa Antibiotics Hives and Itching  . Atorvastatin     Other reaction(s): Muscle Pain  . Azithromycin Rash    REVIEW OF SYSTEMS:  CONSTITUTIONAL: No fever, chills or sweats.  Positive for weight loss.  Some fatigue.  EYES: Patient has an artificial eye EARS, NOSE, AND THROAT: No tinnitus or ear pain. No sore throat.  Decreased hearing RESPIRATORY: No cough, shortness of breath, wheezing or hemoptysis.  CARDIOVASCULAR: No chest pain, orthopnea, edema.  GASTROINTESTINAL: No nausea, vomiting.  Positive for abdominal pain.  Positive for diarrhea last week no blood in bowel movements GENITOURINARY: Some dysuria.  Positive for nocturia HEMATOLOGY: No anemia, easy  bruising or bleeding SKIN: No rash or lesion. MUSCULOSKELETAL: No joint pain or arthritis.   NEUROLOGIC: No tingling, numbness, weakness.  PSYCHIATRY: No anxiety or depression.   MEDICATIONS AT HOME:   Prior to Admission medications   Medication Sig Start Date End Date Taking? Authorizing Provider  amiodarone (PACERONE) 200 MG tablet Take 100 mg by mouth daily.   Yes [provider]  amLODipine (NORVASC) 5 MG tablet Take 5 mg by mouth daily. 11/13/14  Yes [provider]  apixaban (ELIQUIS) 2.5 MG TABS tablet Take 2.5 mg by mouth 2 (two) times daily.    Yes [provider]  atorvastatin (LIPITOR) 20 MG tablet Take 20 mg by mouth daily.   Yes [provider]  brimonidine (ALPHAGAN) 0.2 % ophthalmic solution Place 1 drop into the left eye 2 (two) times daily.   Yes [provider]  Cholecalciferol (VITAMIN D) 2000 units tablet Take 2,000 Units by mouth daily.   Yes [provider]  glimepiride (AMARYL) 2 MG tablet Take 2 mg by mouth daily. 10/18/14  Yes [provider]  pantoprazole (PROTONIX) 40 MG tablet Take 40 mg by mouth daily.   Yes [provider]  acetaminophen (TYLENOL 8 HOUR) 650 MG CR tablet Take 650 mg by mouth every 8 (eight) hours as needed for pain.    [provider]  albuterol (PROAIR HFA) 108 (90 Base) MCG/ACT inhaler Inhale 1 puff into the lungs every 6 (six) hours as needed for wheezing or shortness of breath.    [provider]  clotrimazole (GYNE-LOTRIMIN) 1 % vaginal cream Place 1 application vaginally daily as needed. For yeast infection. 11/15/14   [provider]  fluconazole (DIFLUCAN) 100 MG tablet Take 1 tablet (100 mg total) by mouth daily. Patient not taking: Reported on 01/05/2018 05/18/17   Noreene Filbert, MD  GUAIFENESIN 1200 PO Take by mouth. Once a day in am    [provider]  hydrocortisone cream (NEOSPORIN ECZEMA ESSENT MAX ST) 1 % Apply 1 application  topically 2 (two) times daily as needed for itching (for sores.).    [provider]  nystatin (MYCOSTATIN/NYSTOP) powder Apply topically 4 (four) times daily. 12/10/17   Rudene Re, MD  sodium chloride (OCEAN) 0.65 % nasal spray Place 2 sprays into the nose daily as needed. 09/14/14   [provider]  travoprost, benzalkonium, (TRAVATAN) 0.004 % ophthalmic solution Place 1 drop into the left eye at bedtime.    [provider]   Medication reconciliation still undergoing.  VITAL SIGNS:  Blood pressure 123/60, pulse 63, temperature (!) 97.5 F (36.4 C), temperature source Oral, resp. rate (!) 22, height 5\' 3"  (1.6 m), weight 46 kg, SpO2 94 %.  PHYSICAL EXAMINATION:  GENERAL:  84  y.o.-year-old patient lying in the bed with no acute distress.  EYES: Pupils equal, round, reactive to light and accommodation. No scleral icterus. Extraocular muscles intact.  HEENT: Head atraumatic, normocephalic. Oropharynx and nasopharynx clear.  NECK:  Supple, no jugular venous distention. No thyroid enlargement, no tenderness.  LUNGS: Normal breath sounds bilaterally, no wheezing, rales,rhonchi or crepitation. No use of accessory muscles of respiration.  CARDIOVASCULAR: S1, S2 normal. No murmurs, rubs, or gallops.  ABDOMEN: Soft, tender, distended. Bowel sounds present. No organomegaly or mass.  EXTREMITIES: No pedal edema, cyanosis, or clubbing.  NEUROLOGIC: Cranial nerves II through XII are intact. Muscle strength 5/5 in all extremities. Sensation intact. Gait not checked.  PSYCHIATRIC: The patient is alert and oriented x 3.  SKIN: No rash, lesion, or ulcer.   LABORATORY PANEL:   CBC Recent Labs  Lab 01/05/18 1104  WBC 12.4*  HGB 13.3  HCT 40.4  PLT 313   ------------------------------------------------------------------------------------------------------------------  Chemistries  Recent Labs  Lab 01/05/18 1104  NA 136  K 4.6  CL 102  CO2 23  GLUCOSE 250*   BUN 24*  CREATININE 1.07*  CALCIUM 9.1  AST 15  ALT 15  ALKPHOS 78  BILITOT 1.5*   ------------------------------------------------------------------------------------------------------------------    RADIOLOGY:  Ct Abdomen Pelvis W Contrast  Result Date: 01/05/2018 CLINICAL DATA:  84 year old female with a history of lower abdominal pain. EXAM: CT ABDOMEN AND PELVIS WITH CONTRAST TECHNIQUE: Multidetector CT imaging of the abdomen and pelvis was performed using the standard protocol following bolus administration of intravenous contrast. CONTRAST:  16mL OMNIPAQUE IOHEXOL 300 MG/ML  SOLN COMPARISON:  None. FINDINGS: Lower chest: Cardiomegaly. Native coronary disease. Partially imaged cardiac pacing leads. Respiratory motion limits evaluation of the lung bases. Hepatobiliary: Liver parenchyma relatively unremarkable. Low-density cystic lesion within the right liver measures 2.4 cm, likely benign biliary cyst with no internal complexity. Trace intrahepatic biliary ductal dilatation. Surgical changes of cholecystectomy. Common hepatic duct unremarkable. The common bile duct is not well visualized, though favored to be of unremarkable diameter. The distal common bile duct and ampulla appears to be continuous with a polypoid mass projecting into the lumen of the duodenum with intermediate Hounsfield units/enhancement pattern. This measures 2.0 cm x 2.2 cm. Pancreas: Pancreas is atrophic with dilated main pancreatic duct. Low-density cyst without internal complexity or enhancement within the pancreatic head measures 3.4 cm. Mild inflammatory changes at the head of the pancreas at the pancreatic groove, in within the fascial planes posterior to the duodenum. Spleen: Unremarkable spleen Adrenals/Urinary Tract: Unremarkable adrenal glands. Bilateral kidneys with no evidence of nephrolithiasis or hydronephrosis. Subcentimeter low-density cystic lesions of the left kidney, none of which are large enough to  evaluate by CT. Unremarkable course the bilateral ureters. Unremarkable urinary bladder. Stomach/Bowel: Hiatal hernia.  Otherwise unremarkable stomach. Mass continuous with the biliary ampulla measures 2.2 cm x 2.0 cm as a filling defect within the second portion the duodenum. There is circumferential duodenal thickening at the 2/3 portion of the duodenum with associated inflammatory changes of the fat. Small bowel unremarkable without transition point or evidence of obstruction. Normal appendix. Colon decompressed. Mild diverticular change without evidence of acute inflammatory changes. Vascular/Lymphatic: Calcifications of the abdominal aorta and the iliac arteries. Calcifications of the bilateral femoral arteries. Iliac and femoral arteries are patent. Nodularity adjacent to the duodenum may represent small implants or lymphadenopathy. No para-aortic/preaortic lymph nodes. No inguinal or iliac lymphadenopathy. Reproductive: Hysterectomy. Other: No ventral wall hernia. Musculoskeletal: Osteopenia. Multilevel degenerative changes of the thoracolumbar spine. Surgical  changes of right hip arthroplasty. 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 Referral for multidisciplinary evaluation suggested, including GI for endoscopy as well as oncology. Inflammatory changes of the duodenum and the adjacent fascial planes with small local lymph nodes, likely related to tumor involvement, with or without associated pancreatitis. These results were called by telephone at the time of interpretation on 01/05/2018 at 2:23 pm to Dr. Lavonia Drafts . Pancreatic atrophy and main duct dilation, compatible with longstanding partial occlusion/stenosis. There is mild intrahepatic biliary ductal dilatation. Cyst of the pancreatic head, may represent pseudocyst or a synchronous cystic tumor. Low-density cyst of the right liver, likely benign  biliary cyst. Cholecystectomy. Aortic Atherosclerosis (ICD10-I70.0). Electronically Signed   By: Corrie Mckusick D.O.   On: 01/05/2018 14:24    EKG:   EKG ordered by me  IMPRESSION AND PLAN:   1.  Acute pancreatitis with duodenal mass.  N.p.o.  IV fluids.  PRN pain medications.  GI consultation for tissue biopsy. 2.  History of atrial fibrillation hold Eliquis, continue amiodarone 3.  Hyperlipidemia unspecified on atorvastatin 4.  Essential hypertension on Norvasc 5.  Type 2 diabetes mellitus hold glipizide and put on sliding scale only 6.  GERD on Protonix 7.  Patient is a full code    All the records are reviewed and case discussed with ED provider. Management plans discussed with the patient, family and they are in agreement.  CODE STATUS: full code  TOTAL TIME TAKING CARE OF THIS PATIENT: 50 minutes.    Loletha Grayer M.D on 01/05/2018 at 3:30 PM  Between 7am to 6pm - Pager - [phone]  After 6pm call admission pager [phone]  [facility]  [phone]  CC: Primary care physician; Perrin Maltese, MD

## 2018-01-05 NOTE — ED Notes (Signed)
First Nurse Note: Patient complaining of abdominal pain starting yesterday PM.  Bends forward and moans periodically during registration.  Alert and oriented, color good.

## 2018-01-05 NOTE — ED Notes (Signed)
Blank noted made in regards to adenosine made in error

## 2018-01-05 NOTE — ED Provider Notes (Signed)
[facility] Emergency Department Provider Note   ____________________________________________    I have reviewed the triage vital signs and the nursing notes.   HISTORY  Chief Complaint Abdominal Pain     HPI Sarah Dougherty is a 84 y.o. female with a history as noted below who presents today with complaints of abdominal pain.  Patient describes the pain is primarily in her right flank.  Yesterday it was hurting all across her abdomen but is now localized more to her right flank.  She does report that she gets urinary tract infections fairly frequently.  Denies fevers.  Some nausea, no vomiting.  No diarrhea.  Took Tylenol with little improvement  Past Medical History:  Diagnosis Date  . A-fib ([place])   . Arthritis    Osteoarthritis  . Asthma   . Chronic kidney disease    UTI  . COPD (chronic obstructive pulmonary disease) ([place])   . Coronary artery disease   . Diabetes mellitus without complication ([place])   . GERD (gastroesophageal reflux disease)   . Glaucoma (increased eye pressure)   . HOH (hard of hearing)    Left Hearing Aid  . Hypertension   . Hyperthyroidism   . On home oxygen therapy    uses at night  . Presence of permanent cardiac pacemaker   . Sleep apnea    No C-PAP    Patient Active Problem List   Diagnosis Date Noted  . Hip fx ([place]) 04/03/2017  . 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 COPD exacerbation ([place]) 04/07/2016  . Squamous cell carcinoma 03/19/2016  . Bradycardia 12/10/2014  . COPD (chronic obstructive pulmonary disease) ([place]) 12/09/2014    Past Surgical History:  Procedure Laterality Date  . ABDOMINAL HYSTERECTOMY    . EYE SURGERY Left    Cataract Extraction with IOL  . EYE SURGERY Right    Artificial Eye  . HIP ARTHROPLASTY Right 04/07/2017   Procedure: ARTHROPLASTY BIPOLAR HIP (HEMIARTHROPLASTY);  Surgeon: Dereck Leep, MD;  Location: [facility] ORS;  Service: Orthopedics;  Laterality:  Right;  . INSERT / REPLACE / REMOVE PACEMAKER    . PACEMAKER INSERTION Left 12/12/2014   Procedure: INSERTION PACEMAKER;  Surgeon: Isaias Cowman, MD;  Location: [facility] ORS;  Service: Cardiovascular;  Laterality: Left;  . PAROTIDECTOMY Left 12/10/2016   Procedure: PAROTIDECTOMY;  Surgeon: Clyde Canterbury, MD;  Location: [facility] ORS;  Service: ENT;  Laterality: Left;  . RADICAL NECK DISSECTION Left 12/10/2016   Procedure: RADICAL NECK DISSECTION;  Surgeon: Clyde Canterbury, MD;  Location: [facility] ORS;  Service: ENT;  Laterality: Left;    Prior to Admission medications   Medication Sig Start Date End Date Taking? Authorizing Provider  acetaminophen (TYLENOL 8 HOUR) 650 MG CR tablet Take 650 mg by mouth every 8 (eight) hours as needed for pain.    [provider]  albuterol (PROAIR HFA) 108 (90 Base) MCG/ACT inhaler Inhale 1 puff into the lungs every 6 (six) hours as needed for wheezing or shortness of breath.    [provider]  amiodarone (PACERONE) 200 MG tablet Take 100 mg by mouth daily.    [provider]  amLODipine (NORVASC) 5 MG tablet Take 5 mg by mouth daily. 11/13/14   [provider]  apixaban (ELIQUIS) 5 MG TABS tablet Take 2.5 mg by mouth 2 (two) times daily.    [provider]  atorvastatin (LIPITOR) 20 MG tablet Take 20 mg by mouth daily.    [provider]  brimonidine (ALPHAGAN) 0.2 % ophthalmic solution Place 1 drop into the left eye 2 (two) times daily.    [provider]  Cholecalciferol (VITAMIN D) 2000 units tablet Take 2,000 Units by mouth daily.    [provider]  clotrimazole (GYNE-LOTRIMIN) 1 % vaginal cream Place 1 application vaginally daily as needed. For yeast infection. 11/15/14   [provider]  fluconazole (DIFLUCAN) 100 MG tablet Take 1 tablet (100 mg total) by mouth daily. 05/18/17   Noreene Filbert, MD  glimepiride (AMARYL) 2 MG tablet Take 2 mg by mouth daily. 10/18/14   [provider]    GUAIFENESIN 1200 PO Take by mouth. Once a day in am    [provider]  hydrocortisone cream (NEOSPORIN ECZEMA ESSENT MAX ST) 1 % Apply 1 application topically 2 (two) times daily as needed for itching (for sores.).    [provider]  LORazepam (ATIVAN) 0.5 MG tablet Take 0.5 mg by mouth 2 (two) times daily.    [provider]  nystatin (MYCOSTATIN/NYSTOP) powder Apply topically 4 (four) times daily. 12/10/17   Rudene Re, MD  oxyCODONE (OXY IR/ROXICODONE) 5 MG immediate release tablet Take 0.5-1 tablets (2.5-5 mg total) by mouth every 4 (four) hours as needed for moderate pain or severe pain. Patient not taking: Reported on 05/05/2017 04/09/17   Hillary Bow, MD  sodium chloride (OCEAN) 0.65 % nasal spray Place 2 sprays into the nose daily as needed. 09/14/14   [provider]  travoprost, benzalkonium, (TRAVATAN) 0.004 % ophthalmic solution Place 1 drop into the left eye at bedtime.    [provider]     Allergies Amoxicillin; Cephalexin; Nitrofurantoin; Penicillins; Sulfa antibiotics; Atorvastatin; and Azithromycin  Family History  Problem Relation Age of Onset  . Diabetes Brother   . Diabetes Son   . Stroke Mother   . Cancer Father   . Stroke Sister   . Stroke Sister   . COPD Brother   . Diabetes Son     Social History Social History   Tobacco Use  . Smoking status: Never Smoker  . Smokeless tobacco: Never Used  Substance Use Topics  . Alcohol use: No  . Drug use: No    Review of Systems  Constitutional: No fever/chills Eyes: No visual changes.  ENT: No sore throat. Cardiovascular: Denies chest pain. Respiratory: Denies shortness of breath. Gastrointestinal: As above Genitourinary: Negative for dysuria. Musculoskeletal: Negative for back pain. Skin: Negative for rash. Neurological: Negative for headaches or weakness   ____________________________________________   PHYSICAL EXAM:  VITAL SIGNS: ED Triage  Vitals  Enc Vitals Group     BP 01/05/18 1052 (!) 134/58     Pulse Rate 01/05/18 1052 63     Resp 01/05/18 1052 18     Temp 01/05/18 1052 (!) 97.5 F (36.4 C)     Temp Source 01/05/18 1052 Oral     SpO2 01/05/18 1052 98 %     Weight 01/05/18 1058 46 kg (101 lb 6.6 oz)     Height 01/05/18 1058 1.6 m (5\' 3" )     Head Circumference --      Peak Flow --      Pain Score 01/05/18 1058 9     Pain Loc --      Pain Edu? --      Excl. in [place]? --     Constitutional: Alert and oriented.  Eyes: Conjunctivae are normal.   Nose: No congestion/rhinnorhea. Mouth/Throat: Mucous membranes are moist.  Cardiovascular: Normal rate, regular rhythm. Grossly normal heart sounds.  Good peripheral circulation. Respiratory: Normal respiratory effort.  No retractions. Lungs CTAB. Gastrointestinal: No distention, mild tenderness along the right upper and right lower quadrant.  No CVA tenderness  Musculoskeletal: No lower extremity tenderness nor edema.   Neurologic:  Normal speech and language. No gross focal neurologic deficits are appreciated.  Skin:  Skin is warm, dry and intact. No rash noted. Psychiatric: Mood and affect are normal. Speech and behavior are normal.  ____________________________________________   LABS (all labs ordered are listed, but only abnormal results are displayed)  Labs Reviewed  LIPASE, BLOOD - Abnormal; Notable for the following components:      Result Value   Lipase 628 (*)    All other components within normal limits  COMPREHENSIVE METABOLIC PANEL - Abnormal; Notable for the following components:   Glucose, Bld 250 (*)    BUN 24 (*)    Creatinine, Ser 1.07 (*)    Total Bilirubin 1.5 (*)    GFR calc non Af Amer 43 (*)    GFR calc Af Amer 50 (*)    All other components within normal limits  CBC - Abnormal; Notable for the following components:   WBC 12.4 (*)    RDW 16.0 (*)    All other components within normal limits  URINALYSIS, COMPLETE (UACMP) WITH  MICROSCOPIC - Abnormal; Notable for the following components:   Color, Urine YELLOW (*)    APPearance HAZY (*)    Glucose, UA >=500 (*)    Leukocytes, UA TRACE (*)    All other components within normal limits   ____________________________________________  EKG   ____________________________________________  RADIOLOGY  CT abdomen pelvis demonstrates likely duodenal tumor ____________________________________________   PROCEDURES  Procedure(s) performed: No  Procedures   Critical Care performed: No ____________________________________________   INITIAL IMPRESSION / ASSESSMENT AND PLAN / ED COURSE  Pertinent labs & imaging results that were available during my care of the patient were reviewed by me and considered in my medical decision making (see chart for details).  Patient presents with vague abdominal pain which seems to be more in her right flank now.  Differential includes urinary tract infection, ureterolithiasis, muscular skeletal pain.  We will send labs, urine and obtain imaging if necessary  Lipase is significantly elevated, review of medical records does not demonstrate history of recent imaging, will obtain CT abdomen pelvis to evaluate further  Patient given morphine and Zofran IV  Called by radiologist and notified of concerning findings suggestive of tumor on the CT scan may be causing reactive pancreatitis.  Discussed this at length with patient and son.  Also discussed with Dr. Tasia Catchings of oncology, all agree that admission to the hospital for further testing is necessary before they make any decisions about further treatment    ____________________________________________   FINAL CLINICAL IMPRESSION(S) / ED DIAGNOSES  Final diagnoses:  Other acute pancreatitis without infection or necrosis  Duodenal mass  Abdominal pain, unspecified abdominal location        Note:  This document was prepared using Dragon voice recognition software and may include  unintentional dictation errors.    Lavonia Drafts, MD 01/05/18 [phone]

## 2018-01-05 NOTE — ED Triage Notes (Signed)
Pt reports that she began c/o lower abd pain last night, seen by pcp and sent here d/t left lower quad tenderness. Pt has had some nausea but no vomiting or diarrhea.

## 2018-01-05 NOTE — Progress Notes (Signed)
Patient ID: Sarah Dougherty, female   DOB: 08-05-1921, 84 y.o.   MRN: [id number]  ACP note  Patient and son at the bedside  Diagnosis: Acute pancreatitis with the bottom no mass, atrial fibrillation, hyperlipidemia, essential hypertension, type 2 diabetes, GERD.  CODE STATUS discussed and patient is a full code  Plan.  N.p.o. and IV fluids to treat pancreatitis.  PRN pain and nausea medication.  For duodenal mass get GI consultation for procedure and tissue biopsy.  Time spent on ACP discussion 17 minutes Dr. Loletha Grayer

## 2018-01-05 NOTE — ED Notes (Signed)
Admitting provider at bedside.

## 2018-01-06 DIAGNOSIS — K3189 Other diseases of stomach and duodenum: Secondary | ICD-10-CM

## 2018-01-06 DIAGNOSIS — Z7901 Long term (current) use of anticoagulants: Secondary | ICD-10-CM

## 2018-01-06 DIAGNOSIS — R109 Unspecified abdominal pain: Secondary | ICD-10-CM

## 2018-01-06 DIAGNOSIS — I482 Chronic atrial fibrillation: Secondary | ICD-10-CM

## 2018-01-06 DIAGNOSIS — K858 Other acute pancreatitis without necrosis or infection: Principal | ICD-10-CM

## 2018-01-06 LAB — BASIC METABOLIC PANEL
ANION GAP: 7 (ref 5–15)
BUN: 16 mg/dL (ref 8–23)
CALCIUM: 8.3 mg/dL — AB (ref 8.9–10.3)
CO2: 25 mmol/L (ref 22–32)
CREATININE: 0.71 mg/dL (ref 0.44–1.00)
Chloride: 108 mmol/L (ref 98–111)
Glucose, Bld: 129 mg/dL — ABNORMAL HIGH (ref 70–99)
Potassium: 4 mmol/L (ref 3.5–5.1)
SODIUM: 140 mmol/L (ref 135–145)

## 2018-01-06 LAB — GLUCOSE, CAPILLARY
GLUCOSE-CAPILLARY: 140 mg/dL — AB (ref 70–99)
GLUCOSE-CAPILLARY: 82 mg/dL (ref 70–99)
GLUCOSE-CAPILLARY: 145 mg/dL — AB (ref 70–99)

## 2018-01-06 LAB — CBC
HCT: 36.5 % (ref 35.0–47.0)
HEMOGLOBIN: 12.3 g/dL (ref 12.0–16.0)
MCH: 30.3 pg (ref 26.0–34.0)
MCHC: 33.7 g/dL (ref 32.0–36.0)
MCV: 89.8 fL (ref 80.0–100.0)
PLATELETS: 258 10*3/uL (ref 150–440)
RBC: 4.06 MIL/uL (ref 3.80–5.20)
RDW: 15.9 % — AB (ref 11.5–14.5)
WBC: 10.4 10*3/uL (ref 3.6–11.0)

## 2018-01-06 LAB — HEMOGLOBIN A1C
HEMOGLOBIN A1C: 8.2 % — AB (ref 4.8–5.6)
MEAN PLASMA GLUCOSE: 188.64 mg/dL

## 2018-01-06 LAB — LIPASE, BLOOD: Lipase: 124 U/L — ABNORMAL HIGH (ref 11–51)

## 2018-01-06 MED ORDER — BRIMONIDINE TARTRATE 0.2 % OP SOLN
1.0000 [drp] | Freq: Two times a day (BID) | OPHTHALMIC | Status: DC
  Administered 2018-01-06 – 2018-01-08 (×3): 1 [drp] via OPHTHALMIC
  Filled 2018-01-06: qty 5

## 2018-01-06 MED ORDER — AMIODARONE HCL 200 MG PO TABS
100.0000 mg | ORAL_TABLET | Freq: Every day | ORAL | Status: DC
  Administered 2018-01-06 – 2018-01-08 (×3): 100 mg via ORAL
  Filled 2018-01-06 (×3): qty 1

## 2018-01-06 MED ORDER — SALINE SPRAY 0.65 % NA SOLN
2.0000 | Freq: Every day | NASAL | Status: DC | PRN
  Filled 2018-01-06: qty 44

## 2018-01-06 MED ORDER — INSULIN ASPART 100 UNIT/ML ~~LOC~~ SOLN
0.0000 [IU] | Freq: Four times a day (QID) | SUBCUTANEOUS | Status: DC
  Administered 2018-01-06: 14:00:00 1 [IU] via SUBCUTANEOUS
  Administered 2018-01-07: 13:00:00 2 [IU] via SUBCUTANEOUS
  Administered 2018-01-07: 1 [IU] via SUBCUTANEOUS
  Administered 2018-01-07: 18:00:00 2 [IU] via SUBCUTANEOUS
  Administered 2018-01-08: 1 [IU] via SUBCUTANEOUS
  Administered 2018-01-08: 12:00:00 5 [IU] via SUBCUTANEOUS
  Filled 2018-01-06 (×6): qty 1

## 2018-01-06 MED ORDER — ALPRAZOLAM 0.25 MG PO TABS
0.2500 mg | ORAL_TABLET | Freq: Two times a day (BID) | ORAL | Status: DC | PRN
  Administered 2018-01-07: 0.25 mg via ORAL
  Filled 2018-01-06: qty 1

## 2018-01-06 MED ORDER — TRAVOPROST (BAK FREE) 0.004 % OP SOLN
1.0000 [drp] | Freq: Every day | OPHTHALMIC | Status: DC
  Administered 2018-01-06 – 2018-01-07 (×2): 1 [drp] via OPHTHALMIC
  Filled 2018-01-06: qty 2.5

## 2018-01-06 MED ORDER — AMLODIPINE BESYLATE 5 MG PO TABS
5.0000 mg | ORAL_TABLET | Freq: Every day | ORAL | Status: DC
  Administered 2018-01-06 – 2018-01-08 (×2): 5 mg via ORAL
  Filled 2018-01-06 (×2): qty 1

## 2018-01-06 MED ORDER — ALBUTEROL SULFATE (2.5 MG/3ML) 0.083% IN NEBU: 3.0000 mL | INHALATION_SOLUTION | Freq: Four times a day (QID) | RESPIRATORY_TRACT | Status: DC | PRN

## 2018-01-06 MED ORDER — PANTOPRAZOLE SODIUM 40 MG PO TBEC
40.0000 mg | DELAYED_RELEASE_TABLET | Freq: Every day | ORAL | Status: DC
  Administered 2018-01-06 – 2018-01-08 (×3): 40 mg via ORAL
  Filled 2018-01-06 (×3): qty 1

## 2018-01-06 MED ORDER — VITAMIN D 1000 UNITS PO TABS
2000.0000 [IU] | ORAL_TABLET | Freq: Every day | ORAL | Status: DC
  Administered 2018-01-06 – 2018-01-08 (×3): 2000 [IU] via ORAL
  Filled 2018-01-06 (×4): qty 2

## 2018-01-06 NOTE — Progress Notes (Signed)
   01/06/18 1400  Clinical Encounter Type  Visited With Patient;Family  Visit Type Initial (order for prayer)  Referral From Nurse  Consult/Referral To [place] responded to order for prayer.  Patient and visitor reported that patient had just received a shot and was 'feeling sleepy.'  Chaplain offered prayer for patient regarding hopes for answers and healing.  Chaplain encouraged patient and family to reach out if they would like a visit later.

## 2018-01-06 NOTE — Progress Notes (Signed)
[place] [place] NAME: Sarah Dougherty    MR#:  [id number]  DATE OF BIRTH:  [birth year]  SUBJECTIVE:  CHIEF COMPLAINT:   Chief Complaint  Patient presents with  . Abdominal Pain   - feeling much better today - abdominal pain resolved  REVIEW OF SYSTEMS:  Review of Systems  Constitutional: Positive for weight loss. Negative for chills and fever.  HENT: Negative for congestion, ear discharge, hearing loss and nosebleeds.   Eyes: Positive for blurred vision.  Respiratory: Negative for cough, shortness of breath and wheezing.   Cardiovascular: Negative for chest pain, palpitations and leg swelling.  Gastrointestinal: Negative for abdominal pain, constipation, diarrhea, nausea and vomiting.  Genitourinary: Negative for dysuria.  Musculoskeletal: Negative for myalgias.  Neurological: Negative for dizziness, focal weakness, seizures, weakness and headaches.  Psychiatric/Behavioral: Negative for depression.    DRUG ALLERGIES:   Allergies  Allergen Reactions  . Amoxicillin Other (See Comments)    unknown  . Cephalexin Other (See Comments)    unknown  . Nitrofurantoin Other (See Comments)     Unknown  . Penicillins Other (See Comments)    Other reaction(s): Unknown  . Sulfa Antibiotics Hives and Itching  . Atorvastatin     Other reaction(s): Muscle Pain  . Azithromycin Rash    VITALS:  Blood pressure 111/63, pulse 61, temperature 99.6 F (37.6 C), temperature source Oral, resp. rate 18, height 5\' 1"  (1.549 m), weight 49.5 kg, SpO2 93 %.  PHYSICAL EXAMINATION:  Physical Exam  GENERAL:  84 y.o.-year-old elderly patient lying in the bed with no acute distress.  EYES: left pupil equal, round, reactive to light and accommodation. 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 No scleral icterus. Extraocular muscles intact.  HEENT: Head atraumatic, normocephalic. Oropharynx and nasopharynx clear.  NECK:  Supple, no jugular venous  distention. No thyroid enlargement, no tenderness.  LUNGS: Normal breath sounds bilaterally, no wheezing, rales,rhonchi or crepitation. No use of accessory muscles of respiration. Decreased bibasilar breath sounds CARDIOVASCULAR: S1, S2 normal. No  rubs, or gallops. 3/6 systolic murmur present ABDOMEN: Soft, nontender, nondistended. Bowel sounds present. No organomegaly or mass.  EXTREMITIES: No pedal edema, cyanosis, or clubbing.  NEUROLOGIC: Cranial nerves II through XII are intact. Muscle strength 5/5 in all extremities. Sensation intact. Gait not checked.  PSYCHIATRIC: The patient is alert and oriented x 3.  SKIN: No obvious rash, lesion, or ulcer.    LABORATORY PANEL:   CBC Recent Labs  Lab 01/06/18 0456  WBC 10.4  HGB 12.3  HCT 36.5  PLT 258   ------------------------------------------------------------------------------------------------------------------  Chemistries  Recent Labs  Lab 01/05/18 1104 01/06/18 0456  NA 136 140  K 4.6 4.0  CL 102 108  CO2 23 25  GLUCOSE 250* 129*  BUN 24* 16  CREATININE 1.07* 0.71  CALCIUM 9.1 8.3*  AST 15  --   ALT 15  --   ALKPHOS 78  --   BILITOT 1.5*  --    ------------------------------------------------------------------------------------------------------------------  Cardiac Enzymes No results for input(s): TROPONINI in the last 168 hours. ------------------------------------------------------------------------------------------------------------------  RADIOLOGY:  Ct Abdomen Pelvis W Contrast  Result Date: 01/05/2018 CLINICAL DATA:  84 year old female with a history of lower abdominal pain. EXAM: CT ABDOMEN AND PELVIS WITH CONTRAST TECHNIQUE: Multidetector CT imaging of the abdomen and pelvis was performed using the standard protocol following bolus administration of intravenous contrast. CONTRAST:  36mL OMNIPAQUE IOHEXOL 300 MG/ML  SOLN COMPARISON:  None. FINDINGS: Lower chest: Cardiomegaly. Native coronary  disease.  Partially imaged cardiac pacing leads. Respiratory motion limits evaluation of the lung bases. Hepatobiliary: Liver parenchyma relatively unremarkable. Low-density cystic lesion within the right liver measures 2.4 cm, likely benign biliary cyst with no internal complexity. Trace intrahepatic biliary ductal dilatation. Surgical changes of cholecystectomy. Common hepatic duct unremarkable. The common bile duct is not well visualized, though favored to be of unremarkable diameter. The distal common bile duct and ampulla appears to be continuous with a polypoid mass projecting into the lumen of the duodenum with intermediate Hounsfield units/enhancement pattern. This measures 2.0 cm x 2.2 cm. Pancreas: Pancreas is atrophic with dilated main pancreatic duct. Low-density cyst without internal complexity or enhancement within the pancreatic head measures 3.4 cm. Mild inflammatory changes at the head of the pancreas at the pancreatic groove, in within the fascial planes posterior to the duodenum. Spleen: Unremarkable spleen Adrenals/Urinary Tract: Unremarkable adrenal glands. Bilateral kidneys with no evidence of nephrolithiasis or hydronephrosis. Subcentimeter low-density cystic lesions of the left kidney, none of which are large enough to evaluate by CT. Unremarkable course the bilateral ureters. Unremarkable urinary bladder. Stomach/Bowel: Hiatal hernia.  Otherwise unremarkable stomach. Mass continuous with the biliary ampulla measures 2.2 cm x 2.0 cm as a filling defect within the second portion the duodenum. There is circumferential duodenal thickening at the 2/3 portion of the duodenum with associated inflammatory changes of the fat. Small bowel unremarkable without transition point or evidence of obstruction. Normal appendix. Colon decompressed. Mild diverticular change without evidence of acute inflammatory changes. Vascular/Lymphatic: Calcifications of the abdominal aorta and the iliac arteries. Calcifications of  the bilateral femoral arteries. Iliac and femoral arteries are patent. Nodularity adjacent to the duodenum may represent small implants or lymphadenopathy. No para-aortic/preaortic lymph nodes. No inguinal or iliac lymphadenopathy. Reproductive: Hysterectomy. Other: No ventral wall hernia. Musculoskeletal: Osteopenia. Multilevel degenerative changes of the thoracolumbar spine. Surgical changes of right hip arthroplasty. 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 Referral for multidisciplinary evaluation suggested, including GI for endoscopy as well as oncology. Inflammatory changes of the duodenum and the adjacent fascial planes with small local lymph nodes, likely related to tumor involvement, with or without associated pancreatitis. These results were called by telephone at the time of interpretation on 01/05/2018 at 2:23 pm to Dr. Lavonia Drafts . Pancreatic atrophy and main duct dilation, compatible with longstanding partial occlusion/stenosis. There is mild intrahepatic biliary ductal dilatation. Cyst of the pancreatic head, may represent pseudocyst or a synchronous cystic tumor. Low-density cyst of the right liver, likely benign biliary cyst. Cholecystectomy. Aortic Atherosclerosis (ICD10-I70.0). Electronically Signed   By: Corrie Mckusick D.O.   On: 01/05/2018 14:24    EKG:   Orders placed or performed during the hospital encounter of 01/05/18  . EKG 12-Lead  . EKG 12-Lead    ASSESSMENT AND PLAN:   84y/o F with PMH significant for HTN, DM, afib on eliquis brought from home secondary to abdominal pain.  1. Acute pancreatitis- likely reactive pancreatitis- secondary to her pancreatic mass - improving with fluids and NPO - started on clear liquids - clinically improving  2. 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 Afib- on amiodarone - hold eliquis for biopsy  4. DM- hold amaryl- SSI  5. DVT Prophylaxis- eliquis on hold- for procedure tomorrow  PT  consulted. Son updated at bedside     All the records are reviewed and case discussed with Care Management/Social Workerr. Management plans discussed with the patient, family and they are in agreement.  CODE STATUS: Full Code  TOTAL TIME TAKING CARE OF THIS PATIENT: 38 minutes.   POSSIBLE D/C IN 2 DAYS, DEPENDING ON CLINICAL CONDITION.   Gladstone Lighter M.D on 01/06/2018 at 5:25 PM  Between 7am to 6pm - Pager - [phone]  After 6pm go to [email or web address] - password [email or web address] [facility]  Office  [phone]  CC: Primary care physician; Perrin Maltese, MD

## 2018-01-06 NOTE — Progress Notes (Signed)
Physical Therapy Evaluation Patient Details Name: Sarah Dougherty MRN: [id number] DOB: 05-19-1921 Today's Date: 01/06/2018   History of Present Illness  Sarah Dougherty  is a 84 y.o. female presents with abdominal pain.  She states the pain started last night.  She was eating a lot of tomatoes so they thought it could be a diverticulitis.  Pain worsened this morning.  Was 7-8 out of 10 in intensity and now constant.  No nausea vomiting.  Some diarrhea last week.  No fever.  Positive for weight loss.  They went to their physician and then they decided to come to the ER for further evaluation.  In the ER, she was found to have a elevated lipase and CT scan showed a mass in the second portion of the duodenum.  Hospitalist services were contacted for further evaluation. She is currently admitted for acute pancreatitis with mass.  Clinical Impression  Pt admitted with above diagnosis. Pt currently with functional limitations due to the deficits listed below (see PT Problem List). Pt requires minA+1 to assist LE management and trunk with bed mobility. Stable once sitting upright at EOB. She demonstrates safe hand placement during transfers. Slow sequencing but stable with UE support on walker. Pt is able to ambulate partially around RN station with therapist and CGA. She ambulates slowly but is steady with use of rolling walker. SaO2 drops to 89% on room air but pt denies DOE. Pt placed back on supplemental O2 when back in room. Pt has an independent exercise program and 24/7 support from family. No PT needs after discharge. Will keep patient on caseload to prevent deconditioning during admission. Pt will benefit from PT services to address deficits in strength, balance, and mobility in order to return to full function at home.     Follow Up Recommendations No PT follow up;Other (comment)(Independent with family support and HEP)    Equipment Recommendations  None recommended by PT;Other (comment)(Continue  using walker)    Recommendations for Other Services       Precautions / Restrictions Precautions Precautions: Fall Restrictions Weight Bearing Restrictions: No      Mobility  Bed Mobility Overal bed mobility: Needs Assistance Bed Mobility: Supine to Sit     Supine to sit: Min assist     General bed mobility comments: Pt requires minA+1 to assist LE management and trunk. Stable once sitting upright at EOB  Transfers Overall transfer level: Needs assistance Equipment used: Rolling walker (2 wheeled) Transfers: Sit to/from Stand Sit to Stand: Min guard         General transfer comment: Pt demonstrates safe hand placement. Slow sequencing but stable with UE support on walker  Ambulation/Gait Ambulation/Gait assistance: Min guard Gait Distance (Feet): 120 Feet Assistive device: Rolling walker (2 wheeled)       General Gait Details: Pt is able to ambulate partially around RN station with therapist and CGA. She ambulates slowly but is steady with use of rolling walker. SaO2 drops to 89% on room air but pt denies DOE.   Stairs            Wheelchair Mobility    Modified Rankin (Stroke Patients Only)       Balance Overall balance assessment: Needs assistance Sitting-balance support: No upper extremity supported Sitting balance-Leahy Scale: Good     Standing balance support: No upper extremity supported Standing balance-Leahy Scale: Fair Standing balance comment: Able to maintain balance with feet apart but requires assist to place feet together and to maintain balance  Pertinent Vitals/Pain Pain Assessment: No/denies pain    Home Living Family/patient expects to be discharged to:: Private residence Living Arrangements: Children;Other (Comment)(Son) Available Help at Discharge: Family Type of Home: House Home Access: Ramped entrance     Home Layout: One level Home Equipment: [place] - 2 wheels;Walker - 4  wheels;Cane - single point;Toilet riser;Shower seat      Prior Function Level of Independence: Needs assistance   Gait / Transfers Assistance Needed: Pt reports ambulating sometimes with walker or rollator.  1 fall reported in the last 12 months  ADL's / Homemaking Assistance Needed: Son reports patient was independent with basic self care tasks, would do cooking and some cleaning as well as laundry.  She was independent with her medication management. She does require some family assist for grocery shopping and meal preparation        Hand Dominance   Dominant Hand: Right    Extremity/Trunk Assessment   Upper Extremity Assessment Upper Extremity Assessment: Generalized weakness    Lower Extremity Assessment Lower Extremity Assessment: Generalized weakness       Communication   Communication: HOH  Cognition Arousal/Alertness: Awake/alert Behavior During Therapy: WFL for tasks assessed/performed Overall Cognitive Status: Within Functional Limits for tasks assessed                                        General Comments      Exercises     Assessment/Plan    PT Assessment Patient needs continued PT services  PT Problem List Decreased strength;Decreased activity tolerance;Decreased balance       PT Treatment Interventions DME instruction;Stair training;Functional mobility training;Therapeutic activities;Therapeutic exercise;Balance training;Neuromuscular re-education;Patient/family education    PT Goals (Current goals can be found in the Care Plan section)  Acute Rehab PT Goals Patient Stated Goal: Return to prior level of function PT Goal Formulation: With patient Time For Goal Achievement: 01/20/18 Potential to Achieve Goals: Good    Frequency Min 2X/week   Barriers to discharge        Co-evaluation               AM-PAC PT "6 Clicks" Daily Activity  Outcome Measure Difficulty turning over in bed (including adjusting bedclothes,  sheets and blankets)?: A Little Difficulty moving from lying on back to sitting on the side of the bed? : Unable Difficulty sitting down on and standing up from a chair with arms (e.g., wheelchair, bedside commode, etc,.)?: A Little Help needed moving to and from a bed to chair (including a wheelchair)?: A Little Help needed walking in hospital room?: A Little Help needed climbing 3-5 steps with a railing? : A Lot 6 Click Score: 15    End of Session Equipment Utilized During Treatment: Gait belt Activity Tolerance: Patient tolerated treatment well Patient left: in bed;with call bell/phone within reach;with bed alarm set;with family/visitor present   PT Visit Diagnosis: Unsteadiness on feet (R26.81);Muscle weakness (generalized) (M62.81)    Time: 2778-2423 PT Time Calculation (min) (ACUTE ONLY): 17 min   Charges:   PT Evaluation $PT Eval Low Complexity: 1 Low          Kenzli Barritt D Babara [place] PT, DPT, GCS  Naturi Alarid 01/06/2018, 12:21 PM

## 2018-01-06 NOTE — Consult Note (Signed)
Hematology/Oncology Consult note [facility] [phone] [phone]   Patient Care Team: Perrin Maltese, MD as PCP - General (Internal Medicine) Perrin Maltese, MD as Referring Physician (Internal Medicine) Bary Castilla Forest Gleason, MD (General Surgery)   CHIEF COMPLAINTS/REASON FOR VISIT:  Evaluation of duodenal mass   HISTORY OF PRESENTING ILLNESS:  Sarah Dougherty is a  84 y.o.  female with PMH listed below who was referred to me for evaluation of duodenal mass. Patient presented to emergency room yesterday for evaluation of abdominal pain/right side flank pain.  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  There is also pancreatic atrophy in the main duct dilatation compatible with long-standing partial occlusion stenosis mild intrahepatic ductal dilation.  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 Patient's lipase was elevated at 628. Emergency room physician Dr. Corky Downs called me and discussed about patient's case.  Recommend patient to be admitted for treatment of pancreatitis, consult GI and oncology for further evaluation and discussion of management. Patient was admitted, n.p.o., IV fluid. Today patient feels that pain has subsided.  Feels comfortable.  No reports of nausea vomiting.  Patient's son was at bedside.  History of atrial fibrillation on Eliquis at home.  Eliquis was held due to upcoming procedures.  According to patient's son, patient's walks with a walker at home to bathroom.   Review of Systems  Constitutional: Positive for malaise/fatigue. Negative for chills, fever and weight loss.  HENT: Negative for nosebleeds and sore throat.   Eyes: Negative for double vision, photophobia and redness.  Respiratory: Negative for cough, shortness of breath and  wheezing.   Cardiovascular: Negative for chest pain, palpitations and orthopnea.  Gastrointestinal: Positive for abdominal pain. Negative for blood in stool, nausea and vomiting.  Genitourinary: Negative for dysuria.  Musculoskeletal: Negative for back pain, myalgias and neck pain.  Skin: Negative for itching and rash.  Neurological: Negative for dizziness, tingling and tremors.  Endo/Heme/Allergies: Negative for environmental allergies. Does not bruise/bleed easily.  Psychiatric/Behavioral: Negative for depression.    MEDICAL HISTORY:  Past Medical History:  Diagnosis Date  . A-fib ([place])   . Arthritis    Osteoarthritis  . Asthma   . Chronic kidney disease    UTI  . COPD (chronic obstructive pulmonary disease) ([place])   . Coronary artery disease   . Diabetes mellitus without complication ([place])   . GERD (gastroesophageal reflux disease)   . Glaucoma (increased eye pressure)   . HOH (hard of hearing)    Left Hearing Aid  . Hypertension   . Hyperthyroidism   . On home oxygen therapy    uses at night  . Presence of permanent cardiac pacemaker   . Sleep apnea    No C-PAP    SURGICAL HISTORY: Past Surgical History:  Procedure Laterality Date  . ABDOMINAL HYSTERECTOMY    . EYE SURGERY Left    Cataract Extraction with IOL  . EYE SURGERY Right    Artificial Eye  . HIP ARTHROPLASTY Right 04/07/2017   Procedure: ARTHROPLASTY BIPOLAR HIP (HEMIARTHROPLASTY);  Surgeon: Dereck Leep, MD;  Location: [facility] ORS;  Service: Orthopedics;  Laterality: Right;  . INSERT / REPLACE / REMOVE PACEMAKER    . PACEMAKER INSERTION Left 12/12/2014   Procedure: INSERTION PACEMAKER;  Surgeon: Isaias Cowman, MD;  Location: [facility] ORS;  Service: Cardiovascular;  Laterality: Left;  . PAROTIDECTOMY Left 12/10/2016   Procedure: PAROTIDECTOMY;  Surgeon: Clyde Canterbury, MD;  Location: [facility] ORS;  Service: ENT;  Laterality: Left;  . RADICAL NECK DISSECTION Left 12/10/2016   Procedure: RADICAL NECK  DISSECTION;  Surgeon: Clyde Canterbury, MD;  Location: [facility] ORS;  Service: ENT;  Laterality: Left;    SOCIAL HISTORY: Social History   Socioeconomic History  . Marital status: Widowed    Spouse name: Not on file  . Number of children: Not on file  . Years of education: Not on file  . Highest education level: Not on file  Occupational History  . Not on file  Social Needs  . Financial resource strain: Not on file  . Food insecurity:    Worry: Not on file    Inability: Not on file  . Transportation needs:    Medical: Not on file    Non-medical: Not on file  Tobacco Use  . Smoking status: Never Smoker  . Smokeless tobacco: Never Used  Substance and Sexual Activity  . Alcohol use: No  . Drug use: No  . Sexual activity: Never  Lifestyle  . Physical activity:    Days per week: Not on file    Minutes per session: Not on file  . Stress: Not on file  Relationships  . Social connections:    Talks on phone: Not on file    Gets together: Not on file    Attends religious service: Not on file    Active member of club or organization: Not on file    Attends meetings of clubs or organizations: Not on file    Relationship status: Not on file  . Intimate partner violence:    Fear of current or ex partner: Not on file    Emotionally abused: Not on file    Physically abused: Not on file    Forced sexual activity: Not on file  Other Topics Concern  . Not on file  Social History Narrative  . Not on file    FAMILY HISTORY: Family History  Problem Relation Age of Onset  . Diabetes Brother   . Diabetes Son   . Stroke Mother   . Cancer Father   . Stroke Sister   . Stroke Sister   . COPD Brother   . Diabetes Son     ALLERGIES:  is allergic to amoxicillin; cephalexin; nitrofurantoin; penicillins; sulfa antibiotics; atorvastatin; and azithromycin.  MEDICATIONS:  Current Facility-Administered Medications  Medication Dose Route Frequency Provider Last Rate Last Dose  . 0.9 %   sodium chloride infusion   Intravenous Continuous Loletha Grayer, MD 50 mL/hr at 01/06/18 1441    . acetaminophen (TYLENOL) tablet 650 mg  650 mg Oral Q6H PRN Loletha Grayer, MD       Or  . acetaminophen (TYLENOL) suppository 650 mg  650 mg Rectal Q6H PRN Wieting, Richard, MD      . insulin aspart (novoLOG) injection 0-9 Units  0-9 Units Subcutaneous Q6H Gladstone Lighter, MD   1 Units at 01/06/18 1346  . morphine 2 MG/ML injection 1 mg  1 mg Intravenous Q4H PRN Wieting, Richard, MD      . oxyCODONE (Oxy IR/ROXICODONE) immediate release tablet 5 mg  5 mg Oral Q6H PRN Loletha Grayer, MD         PHYSICAL EXAMINATION: Vitals:   01/06/18 0420 01/06/18 1327  BP: 111/69 111/63  Pulse: (!) 59 61  Resp: 17 18  Temp: 98.1 F (36.7  C) 99.6 F (37.6 C)  SpO2: 96% 93%   Filed Weights   01/05/18 1058 01/05/18 1845  Weight: 101 lb 6.6 oz (46 kg) 109 lb 2 oz (49.5 kg)    Physical Exam  Constitutional: No distress.  Frail appearance.   HENT:  Head: Normocephalic and atraumatic.  Neck: Neck supple.  Cardiovascular: Normal rate.  Pulmonary/Chest: Effort normal and breath sounds normal.  Abdominal: Normal appearance and bowel sounds are normal. There is no tenderness.  Musculoskeletal: She exhibits no edema.  Neurological: She is alert.  Skin: Skin is warm and dry. She is not diaphoretic.  Psychiatric: She has a normal mood and affect.     LABORATORY DATA:  I have reviewed the data as listed Lab Results  Component Value Date   WBC 10.4 01/06/2018   HGB 12.3 01/06/2018   HCT 36.5 01/06/2018   MCV 89.8 01/06/2018   PLT 258 01/06/2018   Recent Labs    12/10/17 1457 01/05/18 1104 01/06/18 0456  NA 141 136 140  K 3.7 4.6 4.0  CL 107 102 108  CO2 24 23 25   GLUCOSE 100* 250* 129*  BUN 33* 24* 16  CREATININE 0.93 1.07* 0.71  CALCIUM 8.3* 9.1 8.3*  GFRNONAA 51* 43* >60  GFRAA 59* 50* >60  PROT  --  7.2  --   ALBUMIN  --  4.0  --   AST  --  15  --   ALT  --  15  --     ALKPHOS  --  78  --   BILITOT  --  1.5*  --    Iron/TIBC/Ferritin/ %Sat No results found for: IRON, TIBC, FERRITIN, IRONPCTSAT      ASSESSMENT & PLAN:  84 year old female presented with abdominal pain found to have elevated lipase and abnormal CT scan of the abdomen  # 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 GI recommendation noted.  Patient is going to have upper endoscopy to obtain biopsies of this mass. Patient is on chronic anticoagulation with Eliquis for chronic atrial fibrillation.  Eliquis has been held for upcoming procedure. Further management plan pending on tissue diagnosis, taking to consideration of her age and performance status.  # Abdominal pain has resolved.   # Acute pancreatitis, reactive?, lipase trending down. Continue to trend.  Continue gentle hydration.   We spent sufficient time to discuss many aspect of care, questions were answered to patient's satisfaction. Total face to face encounter time for this patient visit was 41min. >50% of the time was  spent in counseling and coordination of care.    Earlie Server, MD, PhD  Hematology Oncology [facility] Pager- [phone] 01/06/2018

## 2018-01-06 NOTE — Consult Note (Signed)
Jonathon Bellows , MD [address] [place], [place], [place], [id number], [place], [place], [place], [address] Phone: [phone]  Fax: [phone]  Consultation  Referring Provider:  Dr Earleen Newport  Primary Care Physician:  Perrin Maltese, MD Primary Gastroenterologist:  None          Reason for Consultation:     Duodenal mass   Date of Admission:  01/05/2018 Date of Consultation:  01/06/2018         HPI:   Sarah Dougherty is a 84 y.o. female presented to the emergency room yesterday with abdominal pain.  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  There is pancreatic atrophy and main duct dilation compatible with long-standing partial occlusion stenosis mild intrahepatic ductal biliary dilation.  Cyst of the pancreatic head may represent a pseudocyst or a synchronous cystic tumor.  Low-density cyst of the liver likely benign cyst status post cholecystectomy.  The lipase was elevated at 628.  Hemoglobin 12.3 g, total bilirubin 1.5 creatinine 1.07.   She was in the room with her son and stated that the upper abdominalpain began all of a sudden one day back and has since resolved. She says she has lost some weight over the past few months, denies any NSAID use . Hard of hearing. No pain presently. Denies any hematemesis or melena.  Past Medical History:  Diagnosis Date  . A-fib ([place])   . Arthritis    Osteoarthritis  . Asthma   . Chronic kidney disease    UTI  . COPD (chronic obstructive pulmonary disease) ([place])   . Coronary artery disease   . Diabetes mellitus without complication ([place])   . GERD (gastroesophageal reflux disease)   . Glaucoma (increased eye pressure)   . HOH (hard of hearing)    Left Hearing Aid  . Hypertension   . Hyperthyroidism   . On home oxygen therapy    uses at night  . Presence of  permanent cardiac pacemaker   . Sleep apnea    No C-PAP    Past Surgical History:  Procedure Laterality Date  . ABDOMINAL HYSTERECTOMY    . EYE SURGERY Left    Cataract Extraction with IOL  . EYE SURGERY Right    Artificial Eye  . HIP ARTHROPLASTY Right 04/07/2017   Procedure: ARTHROPLASTY BIPOLAR HIP (HEMIARTHROPLASTY);  Surgeon: Dereck Leep, MD;  Location: [facility] ORS;  Service: Orthopedics;  Laterality: Right;  . INSERT / REPLACE / REMOVE PACEMAKER    . PACEMAKER INSERTION Left 12/12/2014   Procedure: INSERTION PACEMAKER;  Surgeon: Isaias Cowman, MD;  Location: [facility] ORS;  Service: Cardiovascular;  Laterality: Left;  . PAROTIDECTOMY Left 12/10/2016   Procedure: PAROTIDECTOMY;  Surgeon: Clyde Canterbury, MD;  Location: [facility] ORS;  Service: ENT;  Laterality: Left;  . RADICAL NECK DISSECTION Left 12/10/2016   Procedure: RADICAL NECK DISSECTION;  Surgeon: Clyde Canterbury, MD;  Location: [facility] ORS;  Service: ENT;  Laterality: Left;    Prior to Admission medications   Medication Sig Start Date End Date Taking? Authorizing Provider  acetaminophen (TYLENOL 8 HOUR) 650 MG CR tablet Take 650 mg by mouth every 8 (eight) hours as needed for pain.   Yes [provider]  albuterol (PROAIR HFA) 108 (90 Base) MCG/ACT inhaler Inhale 1 puff into the lungs every 6 (six) hours  as needed for wheezing or shortness of breath.   Yes [provider]  amiodarone (PACERONE) 200 MG tablet Take 100 mg by mouth daily.   Yes [provider]  amLODipine (NORVASC) 5 MG tablet Take 5 mg by mouth daily. 11/13/14  Yes [provider]  apixaban (ELIQUIS) 2.5 MG TABS tablet Take 2.5 mg by mouth 2 (two) times daily.    Yes [provider]  atorvastatin (LIPITOR) 20 MG tablet Take 20 mg by mouth daily.   Yes [provider]  brimonidine (ALPHAGAN) 0.2 % ophthalmic solution Place 1 drop into the left eye 2 (two) times daily.   Yes [provider]  Cholecalciferol  (VITAMIN D) 2000 units tablet Take 2,000 Units by mouth daily.   Yes [provider]  clotrimazole (GYNE-LOTRIMIN) 1 % vaginal cream Place 1 application vaginally daily as needed. For yeast infection. 11/15/14  Yes [provider]  glimepiride (AMARYL) 2 MG tablet Take 2 mg by mouth daily. 10/18/14  Yes [provider]  pantoprazole (PROTONIX) 40 MG tablet Take 40 mg by mouth daily.   Yes [provider]  sodium chloride (OCEAN) 0.65 % nasal spray Place 2 sprays into the nose daily as needed. 09/14/14  Yes [provider]  travoprost, benzalkonium, (TRAVATAN) 0.004 % ophthalmic solution Place 1 drop into the left eye at bedtime.   Yes [provider]  fluconazole (DIFLUCAN) 100 MG tablet Take 1 tablet (100 mg total) by mouth daily. Patient not taking: Reported on 01/05/2018 05/18/17   Noreene Filbert, MD  nystatin (MYCOSTATIN/NYSTOP) powder Apply topically 4 (four) times daily. Patient not taking: Reported on 01/05/2018 12/10/17   Rudene Re, MD    Family History  Problem Relation Age of Onset  . Diabetes Brother   . Diabetes Son   . Stroke Mother   . Cancer Father   . Stroke Sister   . Stroke Sister   . COPD Brother   . Diabetes Son      Social History   Tobacco Use  . Smoking status: Never Smoker  . Smokeless tobacco: Never Used  Substance Use Topics  . Alcohol use: No  . Drug use: No    Allergies as of 01/05/2018 - Review Complete 01/05/2018  Allergen Reaction Noted  . Amoxicillin Other (See Comments) 12/01/2016  . Cephalexin Other (See Comments) 12/01/2016  . Nitrofurantoin Other (See Comments) 12/09/2014  . Penicillins Other (See Comments) 12/09/2014  . Sulfa antibiotics Hives and Itching 12/09/2014  . Atorvastatin  02/08/2015  . Azithromycin Rash 12/09/2014    Review of Systems:    All systems reviewed and negative except where noted in HPI.   Physical Exam:  Vital signs in last 24 hours: Temp:  [97.5 F (36.4  C)-98.6 F (37 C)] 98.1 F (36.7 C) (09/25 0420) Pulse Rate:  [59-72] 59 (09/25 0420) Resp:  [16-22] 17 (09/25 0420) BP: (111-152)/(55-80) 111/69 (09/25 0420) SpO2:  [94 %-98 %] 96 % (09/25 0420) Weight:  [46 kg-49.5 kg] 49.5 kg (09/24 1845)   General:   Pleasant, cooperative in NAD Head:  Normocephalic and atraumatic. Eyes:   No icterus.   Conjunctiva pink. PERRLA. Ears:  Poor hearing  Neck:  Supple; no masses or thyroidomegaly Lungs: Respirations even and unlabored. Lungs clear to auscultation bilaterally.   No wheezes, crackles, or rhonchi.  Heart:  Regular rate and rhythm;  Without murmur, clicks, rubs or gallops Abdomen:  Soft, nondistended, nontender. Normal bowel sounds. No appreciable masses or hepatomegaly.  No rebound or guarding.  Neurologic:  Alert and oriented x3;  grossly normal neurologically. Skin:  Intact without significant lesions or rashes. Cervical Nodes:  No significant cervical adenopathy. Psych:  Alert and cooperative. Normal affect.  LAB RESULTS: Recent Labs    01/05/18 1104 01/06/18 0456  WBC 12.4* 10.4  HGB 13.3 12.3  HCT 40.4 36.5  PLT 313 258   BMET Recent Labs    01/05/18 1104 01/06/18 0456  NA 136 140  K 4.6 4.0  CL 102 108  CO2 23 25  GLUCOSE 250* 129*  BUN 24* 16  CREATININE 1.07* 0.71  CALCIUM 9.1 8.3*   LFT Recent Labs    01/05/18 1104  PROT 7.2  ALBUMIN 4.0  AST 15  ALT 15  ALKPHOS 78  BILITOT 1.5*   PT/INR No results for input(s): LABPROT, INR in the last 72 hours.  STUDIES: Ct Abdomen Pelvis W Contrast  Result Date: 01/05/2018 CLINICAL DATA:  84 year old female with a history of lower abdominal pain. EXAM: CT ABDOMEN AND PELVIS WITH CONTRAST TECHNIQUE: Multidetector CT imaging of the abdomen and pelvis was performed using the standard protocol following bolus administration of intravenous contrast. CONTRAST:  37mL OMNIPAQUE IOHEXOL 300 MG/ML  SOLN COMPARISON:  None. FINDINGS: Lower chest: Cardiomegaly. Native  coronary disease. Partially imaged cardiac pacing leads. Respiratory motion limits evaluation of the lung bases. Hepatobiliary: Liver parenchyma relatively unremarkable. Low-density cystic lesion within the right liver measures 2.4 cm, likely benign biliary cyst with no internal complexity. Trace intrahepatic biliary ductal dilatation. Surgical changes of cholecystectomy. Common hepatic duct unremarkable. The common bile duct is not well visualized, though favored to be of unremarkable diameter. The distal common bile duct and ampulla appears to be continuous with a polypoid mass projecting into the lumen of the duodenum with intermediate Hounsfield units/enhancement pattern. This measures 2.0 cm x 2.2 cm. Pancreas: Pancreas is atrophic with dilated main pancreatic duct. Low-density cyst without internal complexity or enhancement within the pancreatic head measures 3.4 cm. Mild inflammatory changes at the head of the pancreas at the pancreatic groove, in within the fascial planes posterior to the duodenum. Spleen: Unremarkable spleen Adrenals/Urinary Tract: Unremarkable adrenal glands. Bilateral kidneys with no evidence of nephrolithiasis or hydronephrosis. Subcentimeter low-density cystic lesions of the left kidney, none of which are large enough to evaluate by CT. Unremarkable course the bilateral ureters. Unremarkable urinary bladder. Stomach/Bowel: Hiatal hernia.  Otherwise unremarkable stomach. Mass continuous with the biliary ampulla measures 2.2 cm x 2.0 cm as a filling defect within the second portion the duodenum. There is circumferential duodenal thickening at the 2/3 portion of the duodenum with associated inflammatory changes of the fat. Small bowel unremarkable without transition point or evidence of obstruction. Normal appendix. Colon decompressed. Mild diverticular change without evidence of acute inflammatory changes. Vascular/Lymphatic: Calcifications of the abdominal aorta and the iliac arteries.  Calcifications of the bilateral femoral arteries. Iliac and femoral arteries are patent. Nodularity adjacent to the duodenum may represent small implants or lymphadenopathy. No para-aortic/preaortic lymph nodes. No inguinal or iliac lymphadenopathy. Reproductive: Hysterectomy. Other: No ventral wall hernia. Musculoskeletal: Osteopenia. Multilevel degenerative changes of the thoracolumbar spine. Surgical changes of right hip arthroplasty. 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 Referral for multidisciplinary evaluation suggested, including GI for endoscopy as well as oncology. Inflammatory changes of the duodenum and the adjacent fascial planes with small local lymph nodes, likely related to tumor involvement, with or without  associated pancreatitis. These results were called by telephone at the time of interpretation on 01/05/2018 at 2:23 pm to Dr. Lavonia Drafts . Pancreatic atrophy and main duct dilation, compatible with longstanding partial occlusion/stenosis. There is mild intrahepatic biliary ductal dilatation. Cyst of the pancreatic head, may represent pseudocyst or a synchronous cystic tumor. Low-density cyst of the right liver, likely benign biliary cyst. Cholecystectomy. Aortic Atherosclerosis (ICD10-I70.0). Electronically Signed   By: Corrie Mckusick D.O.   On: 01/05/2018 14:24      Impression / Plan:   Sarah Dougherty is a 84 y.o. y/o female with a short history of abdominal pain found to have an elevated lipase and a duodenal mass on CT scan of the abdomen features are suggestive of pancreatitis likely secondary to the duodenal mass.  She is on Eliquis for atrial fibrillation which has been held , has missed 1 dose today .  I will plan to perform upper endoscopy day after tomorrow( after she has skipped 2 doses) to obtain biopsies of this mass and subsequently based on the biopsies further  evaluation in terms of an EUS staging MRCP would be needed to discuss the  options , taking into consideration her age and performance status.  I have discussed alternative options, risks & benefits,  which include, but are not limited to, bleeding, infection, perforation,respiratory complication & drug reaction.  The patient agrees with this plan & written consent will be obtained.    Thank you for involving me in the care of this patient.      LOS: 1 day   Jonathon Bellows, MD  01/06/2018, 9:55 AM

## 2018-01-07 LAB — BASIC METABOLIC PANEL
Anion gap: 5 (ref 5–15)
BUN: 13 mg/dL (ref 8–23)
CO2: 26 mmol/L (ref 22–32)
Calcium: 8 mg/dL — ABNORMAL LOW (ref 8.9–10.3)
Chloride: 109 mmol/L (ref 98–111)
Creatinine, Ser: 0.77 mg/dL (ref 0.44–1.00)
GFR calc Af Amer: >60 mL/min (ref >60)
Glucose, Bld: 121 mg/dL — ABNORMAL HIGH (ref 70–99)
POTASSIUM: 3.8 mmol/L (ref 3.5–5.1)
SODIUM: 140 mmol/L (ref 135–145)

## 2018-01-07 LAB — GLUCOSE, CAPILLARY
GLUCOSE-CAPILLARY: 116 mg/dL — AB (ref 70–99)
GLUCOSE-CAPILLARY: 189 mg/dL — AB (ref 70–99)
Glucose-Capillary: 117 mg/dL — ABNORMAL HIGH (ref 70–99)
Glucose-Capillary: 122 mg/dL — ABNORMAL HIGH (ref 70–99)
Glucose-Capillary: 158 mg/dL — ABNORMAL HIGH (ref 70–99)

## 2018-01-07 LAB — LIPASE, BLOOD: LIPASE: 33 U/L (ref 11–51)

## 2018-01-07 LAB — CANCER ANTIGEN 19-9: CA 19-9: 82 U/mL — ABNORMAL HIGH (ref 0–35)

## 2018-01-07 LAB — CEA: CEA: 6.8 ng/mL — ABNORMAL HIGH (ref 0.0–4.7)

## 2018-01-07 MED ORDER — SODIUM CHLORIDE 0.9 % IV SOLN
INTRAVENOUS | Status: DC
  Administered 2018-01-08: 08:00:00 via INTRAVENOUS

## 2018-01-07 NOTE — Progress Notes (Signed)
Physical Therapy Treatment Patient Details Name: Sarah Dougherty MRN: [id number] DOB: 02/09/1922 Today's Date: 01/07/2018    History of Present Illness Sarah Dougherty  is a 84 y.o. female presents with abdominal pain.  She states the pain started last night.  She was eating a lot of tomatoes so they thought it could be a diverticulitis.  Pain worsened this morning.  Was 7-8 out of 10 in intensity and now constant.  No nausea vomiting.  Some diarrhea last week.  No fever.  Positive for weight loss.  They went to their physician and then they decided to come to the ER for further evaluation.  In the ER, she was found to have a elevated lipase and CT scan showed a mass in the second portion of the duodenum.  Hospitalist services were contacted for further evaluation. She is currently admitted for acute pancreatitis with mass.    PT Comments    Pt did very well with mobility and ambulation.  She did not need direct assist with most tasks and overall showed good confidence with prolonged ambulation effort (200 ft with walker).  She did need occasional cuing directionally as well as with general posture, walker use and at times cues to slow down as she was able to maintain fast speed, but needed to insure appropriate use of walker. Pt should be safe to return home once medically cleared.   Follow Up Recommendations  No PT follow up     Equipment Recommendations       Recommendations for Other Services       Precautions / Restrictions Precautions Precautions: Fall Restrictions Weight Bearing Restrictions: No    Mobility  Bed Mobility Overal bed mobility: Modified Independent Bed Mobility: Supine to Sit     Supine to sit: Supervision     General bed mobility comments: Pt needed use of UEs, but not direct PT assist  Transfers Overall transfer level: Modified independent Equipment used: Rolling walker (2 wheeled) Transfers: Sit to/from Stand Sit to Stand: Supervision          General transfer comment: Pt able to get up to standing and back to sitting with only minimal cuing and no direct assist  Ambulation/Gait Ambulation/Gait assistance: Supervision Gait Distance (Feet): 200 Feet Assistive device: Rolling walker (2 wheeled)       General Gait Details: Pt did well with ambulation showing consistent speed/cadence with good confidence.  Pt on 2 liters O2 t/o the effort with no shortness of breath and only minimal subjective fatigue   Stairs             Wheelchair Mobility    Modified Rankin (Stroke Patients Only)       Balance Overall balance assessment: Needs assistance Sitting-balance support: No upper extremity supported Sitting balance-Leahy Scale: Good                                      Cognition Arousal/Alertness: Awake/alert Behavior During Therapy: WFL for tasks assessed/performed Overall Cognitive Status: Within Functional Limits for tasks assessed                                        Exercises      General Comments        Pertinent Vitals/Pain Pain Assessment: No/denies pain    Home Living  Prior Function            PT Goals (current goals can now be found in the care plan section) Progress towards PT goals: Progressing toward goals    Frequency    Min 2X/week      PT Plan Current plan remains appropriate    Co-evaluation              AM-PAC PT "6 Clicks" Daily Activity  Outcome Measure  Difficulty turning over in bed (including adjusting bedclothes, sheets and blankets)?: None Difficulty moving from lying on back to sitting on the side of the bed? : A Little Difficulty sitting down on and standing up from a chair with arms (e.g., wheelchair, bedside commode, etc,.)?: A Little Help needed moving to and from a bed to chair (including a wheelchair)?: None Help needed walking in hospital room?: None Help needed climbing 3-5 steps  with a railing? : A Lot 6 Click Score: 20    End of Session Equipment Utilized During Treatment: Gait belt Activity Tolerance: Patient tolerated treatment well Patient left: in bed;with call bell/phone within reach;with bed alarm set;with family/visitor present   PT Visit Diagnosis: Unsteadiness on feet (R26.81);Muscle weakness (generalized) (M62.81)     Time: 9629-5284 PT Time Calculation (min) (ACUTE ONLY): 17 min  Charges:  $Gait Training: 8-22 mins                     Kreg Shropshire, DPT 01/07/2018, 5:11 PM

## 2018-01-07 NOTE — Plan of Care (Signed)

## 2018-01-07 NOTE — Progress Notes (Signed)
[facility] NAME: Sarah Dougherty    MR#:  [id number]  DATE OF BIRTH:  May 23, 1921  SUBJECTIVE:  CHIEF COMPLAINT:   Chief Complaint  Patient presents with  . Abdominal Pain   - doing well, on liquid diet- tolerating - for EGD tomorrow  REVIEW OF SYSTEMS:  Review of Systems  Constitutional: Positive for weight loss. Negative for chills and fever.  HENT: Negative for congestion, ear discharge, hearing loss and nosebleeds.   Eyes: Positive for blurred vision.       In right eye  Respiratory: Negative for cough, shortness of breath and wheezing.   Cardiovascular: Negative for chest pain, palpitations and leg swelling.  Gastrointestinal: Negative for abdominal pain, constipation, diarrhea, nausea and vomiting.  Genitourinary: Negative for dysuria.  Musculoskeletal: Negative for myalgias.  Neurological: Negative for dizziness, focal weakness, seizures, weakness and headaches.  Psychiatric/Behavioral: Negative for depression.    DRUG ALLERGIES:   Allergies  Allergen Reactions  . Amoxicillin Other (See Comments)    unknown  . Cephalexin Other (See Comments)    unknown  . Nitrofurantoin Other (See Comments)     Unknown  . Penicillins Other (See Comments)    Other reaction(s): Unknown  . Sulfa Antibiotics Hives and Itching  . Atorvastatin     Other reaction(s): Muscle Pain  . Azithromycin Rash    VITALS:  Blood pressure (!) 96/57, pulse 60, temperature 98.7 F (37.1 C), temperature source Oral, resp. rate 16, height 5\' 1"  (1.549 m), weight 49.5 kg, SpO2 97 %.  PHYSICAL EXAMINATION:  Physical Exam  GENERAL:  84 y.o.-year-old elderly patient lying in the bed with no acute distress.  EYES: left pupil equal, round, reactive to light and accommodation. 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 No scleral icterus. Extraocular muscles intact.  HEENT: Head atraumatic, normocephalic. Oropharynx and nasopharynx clear.  NECK:  Supple,  no jugular venous distention. No thyroid enlargement, no tenderness.  LUNGS: Normal breath sounds bilaterally, no wheezing, rales,rhonchi or crepitation. No use of accessory muscles of respiration. Decreased bibasilar breath sounds CARDIOVASCULAR: S1, S2 normal. No  rubs, or gallops. 3/6 systolic murmur present ABDOMEN: Soft, nontender, nondistended. Bowel sounds present. No organomegaly or mass.  EXTREMITIES: No pedal edema, cyanosis, or clubbing.  NEUROLOGIC: Cranial nerves II through XII are intact. Muscle strength 5/5 in all extremities. Sensation intact. Gait not checked.  PSYCHIATRIC: The patient is alert and oriented x 3.  SKIN: No obvious rash, lesion, or ulcer.    LABORATORY PANEL:   CBC Recent Labs  Lab 01/06/18 0456  WBC 10.4  HGB 12.3  HCT 36.5  PLT 258   ------------------------------------------------------------------------------------------------------------------  Chemistries  Recent Labs  Lab 01/05/18 1104  01/07/18 0339  NA 136   < > 140  K 4.6   < > 3.8  CL 102   < > 109  CO2 23   < > 26  GLUCOSE 250*   < > 121*  BUN 24*   < > 13  CREATININE 1.07*   < > 0.77  CALCIUM 9.1   < > 8.0*  AST 15  --   --   ALT 15  --   --   ALKPHOS 78  --   --   BILITOT 1.5*  --   --    < > = values in this interval not displayed.   ------------------------------------------------------------------------------------------------------------------  Cardiac Enzymes No results for input(s): TROPONINI in the last 168 hours. ------------------------------------------------------------------------------------------------------------------  RADIOLOGY:  Ct Abdomen Pelvis W Contrast  Result Date: 01/05/2018 CLINICAL DATA:  84 year old female with a history of lower abdominal pain. EXAM: CT ABDOMEN AND PELVIS WITH CONTRAST TECHNIQUE: Multidetector CT imaging of the abdomen and pelvis was performed using the standard protocol following bolus administration of intravenous contrast.  CONTRAST:  42mL OMNIPAQUE IOHEXOL 300 MG/ML  SOLN COMPARISON:  None. FINDINGS: Lower chest: Cardiomegaly. Native coronary disease. Partially imaged cardiac pacing leads. Respiratory motion limits evaluation of the lung bases. Hepatobiliary: Liver parenchyma relatively unremarkable. Low-density cystic lesion within the right liver measures 2.4 cm, likely benign biliary cyst with no internal complexity. Trace intrahepatic biliary ductal dilatation. Surgical changes of cholecystectomy. Common hepatic duct unremarkable. The common bile duct is not well visualized, though favored to be of unremarkable diameter. The distal common bile duct and ampulla appears to be continuous with a polypoid mass projecting into the lumen of the duodenum with intermediate Hounsfield units/enhancement pattern. This measures 2.0 cm x 2.2 cm. Pancreas: Pancreas is atrophic with dilated main pancreatic duct. Low-density cyst without internal complexity or enhancement within the pancreatic head measures 3.4 cm. Mild inflammatory changes at the head of the pancreas at the pancreatic groove, in within the fascial planes posterior to the duodenum. Spleen: Unremarkable spleen Adrenals/Urinary Tract: Unremarkable adrenal glands. Bilateral kidneys with no evidence of nephrolithiasis or hydronephrosis. Subcentimeter low-density cystic lesions of the left kidney, none of which are large enough to evaluate by CT. Unremarkable course the bilateral ureters. Unremarkable urinary bladder. Stomach/Bowel: Hiatal hernia.  Otherwise unremarkable stomach. Mass continuous with the biliary ampulla measures 2.2 cm x 2.0 cm as a filling defect within the second portion the duodenum. There is circumferential duodenal thickening at the 2/3 portion of the duodenum with associated inflammatory changes of the fat. Small bowel unremarkable without transition point or evidence of obstruction. Normal appendix. Colon decompressed. Mild diverticular change without evidence  of acute inflammatory changes. Vascular/Lymphatic: Calcifications of the abdominal aorta and the iliac arteries. Calcifications of the bilateral femoral arteries. Iliac and femoral arteries are patent. Nodularity adjacent to the duodenum may represent small implants or lymphadenopathy. No para-aortic/preaortic lymph nodes. No inguinal or iliac lymphadenopathy. Reproductive: Hysterectomy. Other: No ventral wall hernia. Musculoskeletal: Osteopenia. Multilevel degenerative changes of the thoracolumbar spine. Surgical changes of right hip arthroplasty. 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 Referral for multidisciplinary evaluation suggested, including GI for endoscopy as well as oncology. Inflammatory changes of the duodenum and the adjacent fascial planes with small local lymph nodes, likely related to tumor involvement, with or without associated pancreatitis. These results were called by telephone at the time of interpretation on 01/05/2018 at 2:23 pm to Dr. Lavonia Drafts . Pancreatic atrophy and main duct dilation, compatible with longstanding partial occlusion/stenosis. There is mild intrahepatic biliary ductal dilatation. Cyst of the pancreatic head, may represent pseudocyst or a synchronous cystic tumor. Low-density cyst of the right liver, likely benign biliary cyst. Cholecystectomy. Aortic Atherosclerosis (ICD10-I70.0). Electronically Signed   By: Corrie Mckusick D.O.   On: 01/05/2018 14:24    EKG:   Orders placed or performed during the hospital encounter of 01/05/18  . EKG 12-Lead  . EKG 12-Lead    ASSESSMENT AND PLAN:   84y/o F with PMH significant for HTN, DM, afib on eliquis brought from home secondary to abdominal pain.  1. Acute pancreatitis- likely reactive pancreatitis- secondary to her pancreatic/duodenal mass - improving with fluids  - tolerating clear liquids-  full liquids  today - clinically improved- lipase normalized  2. 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 Afib- on amiodarone - hold eliquis for biopsy  4. DM- hold amaryl- SSI  5. DVT Prophylaxis- eliquis on hold- for procedure tomorrow  PT consulted.  Patient ambulated well.  No needs at discharge Sons updated at bedside     All the records are reviewed and case discussed with Care Management/Social Workerr. Management plans discussed with the patient, family and they are in agreement.  CODE STATUS: Full Code  TOTAL TIME TAKING CARE OF THIS PATIENT: 33 minutes.   POSSIBLE D/C IN 2 DAYS, DEPENDING ON CLINICAL CONDITION.   Gladstone Lighter M.D on 01/07/2018 at 1:14 PM  Between 7am to 6pm - Pager - [phone]  After 6pm go to [email or web address] - password [email or web address] [facility]  Office  [phone]  CC: Primary care physician; Perrin Maltese, MD

## 2018-01-08 ENCOUNTER — Inpatient Hospital Stay: Payer: PPO | Admitting: Anesthesiology

## 2018-01-08 ENCOUNTER — Encounter
Admission: EM | Disposition: A | Discharge status: Home / Self Care | Payer: Self-pay | Source: Home / Self Care | Attending: Internal Medicine

## 2018-01-08 ENCOUNTER — Encounter: Payer: Self-pay | Admitting: Anesthesiology

## 2018-01-08 HISTORY — PX: ESOPHAGOGASTRODUODENOSCOPY (EGD) WITH PROPOFOL: SHX5813

## 2018-01-08 LAB — GLUCOSE, CAPILLARY
GLUCOSE-CAPILLARY: 253 mg/dL — AB (ref 70–99)
Glucose-Capillary: 129 mg/dL — ABNORMAL HIGH (ref 70–99)

## 2018-01-08 SURGERY — ESOPHAGOGASTRODUODENOSCOPY (EGD) WITH PROPOFOL
Anesthesia: General

## 2018-01-08 MED ORDER — PROPOFOL 10 MG/ML IV BOLUS
INTRAVENOUS | Status: DC | PRN
  Administered 2018-01-08 (×5): 10 mg via INTRAVENOUS

## 2018-01-08 MED ORDER — LIDOCAINE HCL (PF) 2 % IJ SOLN
INTRAMUSCULAR | Status: AC
  Filled 2018-01-08: qty 10

## 2018-01-08 MED ORDER — PROPOFOL 500 MG/50ML IV EMUL
INTRAVENOUS | Status: DC | PRN
  Administered 2018-01-08: 25 ug/kg/min via INTRAVENOUS

## 2018-01-08 MED ORDER — PROPOFOL 500 MG/50ML IV EMUL
INTRAVENOUS | Status: AC
  Filled 2018-01-08: qty 50

## 2018-01-08 MED ORDER — EPHEDRINE SULFATE 50 MG/ML IJ SOLN
INTRAMUSCULAR | Status: AC
  Filled 2018-01-08: qty 1

## 2018-01-08 MED ORDER — PHENYLEPHRINE HCL 10 MG/ML IJ SOLN
INTRAMUSCULAR | Status: AC
  Filled 2018-01-08: qty 1

## 2018-01-08 MED ORDER — LIDOCAINE HCL (PF) 2 % IJ SOLN
INTRAMUSCULAR | Status: DC | PRN
  Administered 2018-01-08: 50 mg

## 2018-01-08 NOTE — Anesthesia Preprocedure Evaluation (Addendum)
Anesthesia Evaluation  Patient identified by MRN, date of birth, ID band Patient awake    Reviewed: Allergy & Precautions, NPO status , Patient's Chart, lab work & pertinent test results, reviewed documented beta blocker date and time   Airway Mallampati: II  TM Distance: >3 FB     Dental  (+) Chipped, Poor Dentition, Missing   Pulmonary asthma , sleep apnea , COPD,  oxygen dependent,           Cardiovascular hypertension, Pt. on medications and Pt. on home beta blockers + CAD  + dysrhythmias Atrial Fibrillation + pacemaker      Neuro/Psych    GI/Hepatic GERD  ,  Endo/Other  diabetes, Type 2Hyperthyroidism   Renal/GU Renal disease     Musculoskeletal  (+) Arthritis ,   Abdominal   Peds  Hematology   Anesthesia Other Findings   Reproductive/Obstetrics                            Anesthesia Physical Anesthesia Plan  ASA: III  Anesthesia Plan: General   Post-op Pain Management:    Induction: Intravenous  PONV Risk Score and Plan:   Airway Management Planned:   Additional Equipment:   Intra-op Plan:   Post-operative Plan:   Informed Consent: I have reviewed the patients History and Physical, chart, labs and discussed the procedure including the risks, benefits and alternatives for the proposed anesthesia with the patient or authorized representative who has indicated his/her understanding and acceptance.     Plan Discussed with: CRNA  Anesthesia Plan Comments:         Anesthesia Quick Evaluation

## 2018-01-08 NOTE — Transfer of Care (Signed)
Immediate Anesthesia Transfer of Care Note  Patient: Sarah Dougherty  Procedure(s) Performed: ESOPHAGOGASTRODUODENOSCOPY (EGD) WITH PROPOFOL (N/A )  Patient Location: PACU  Anesthesia Type:General  Level of Consciousness: sedated  Airway & Oxygen Therapy: Patient Spontanous Breathing and Patient connected to nasal cannula oxygen  Post-op Assessment: Report given to RN and Post -op Vital signs reviewed and stable  Post vital signs: Reviewed and stable  Last Vitals:  Vitals Value Taken Time  BP    Temp    Pulse    Resp    SpO2      Last Pain:  Vitals:   01/08/18 0414  TempSrc: Oral  PainSc:          Complications: No apparent anesthesia complications

## 2018-01-08 NOTE — Anesthesia Postprocedure Evaluation (Signed)
Anesthesia Post Note  Patient: Sarah Dougherty  Procedure(s) Performed: ESOPHAGOGASTRODUODENOSCOPY (EGD) WITH PROPOFOL (N/A )  Patient location during evaluation: Endoscopy Anesthesia Type: General Level of consciousness: awake and alert Pain management: pain level controlled Vital Signs Assessment: post-procedure vital signs reviewed and stable Respiratory status: spontaneous breathing, nonlabored ventilation, respiratory function stable and patient connected to nasal cannula oxygen Cardiovascular status: blood pressure returned to baseline and stable Postop Assessment: no apparent nausea or vomiting Anesthetic complications: no     Last Vitals:  Vitals:   01/08/18 0943 01/08/18 1013  BP:  (!) 108/59  Pulse:  (!) 59  Resp:  20  Temp:  36.6 C  SpO2: 98% 95%    Last Pain:  Vitals:   01/08/18 1013  TempSrc: Oral  PainSc:                  Isador Castille S

## 2018-01-08 NOTE — Progress Notes (Signed)
Pt being discharge at this time, discharge instructions reviewed with pt and son, states understanding, pt with no complaints at [facility]

## 2018-01-08 NOTE — H&P (Signed)
Jonathon Bellows, MD [address] [place], [place], [place], [address] [id number], [place], [place], [place], [address] Phone: [phone]  Fax: [phone]  Primary Care Physician:  Perrin Maltese, MD   Pre-Procedure History & Physical: HPI:  Sarah Dougherty is a 84 y.o. female is here for an endoscopy    Past Medical History:  Diagnosis Date  . A-fib ([place])   . Arthritis    Osteoarthritis  . Asthma   . Chronic kidney disease    UTI  . COPD (chronic obstructive pulmonary disease) ([place])   . Coronary artery disease   . Diabetes mellitus without complication ([place])   . GERD (gastroesophageal reflux disease)   . Glaucoma (increased eye pressure)   . HOH (hard of hearing)    Left Hearing Aid  . Hypertension   . Hyperthyroidism   . On home oxygen therapy    uses at night  . Presence of permanent cardiac pacemaker   . Sleep apnea    No C-PAP    Past Surgical History:  Procedure Laterality Date  . ABDOMINAL HYSTERECTOMY    . EYE SURGERY Left    Cataract Extraction with IOL  . EYE SURGERY Right    Artificial Eye  . HIP ARTHROPLASTY Right 04/07/2017   Procedure: ARTHROPLASTY BIPOLAR HIP (HEMIARTHROPLASTY);  Surgeon: Dereck Leep, MD;  Location: [facility] ORS;  Service: Orthopedics;  Laterality: Right;  . INSERT / REPLACE / REMOVE PACEMAKER    . PACEMAKER INSERTION Left 12/12/2014   Procedure: INSERTION PACEMAKER;  Surgeon: Isaias Cowman, MD;  Location: [facility] ORS;  Service: Cardiovascular;  Laterality: Left;  . PAROTIDECTOMY Left 12/10/2016   Procedure: PAROTIDECTOMY;  Surgeon: Clyde Canterbury, MD;  Location: [facility] ORS;  Service: ENT;  Laterality: Left;  . RADICAL NECK DISSECTION Left 12/10/2016   Procedure: RADICAL NECK DISSECTION;  Surgeon: Clyde Canterbury, MD;  Location: [facility] ORS;  Service: ENT;  Laterality: Left;    Prior to Admission medications   Medication Sig Start Date End Date Taking? Authorizing Provider  acetaminophen (TYLENOL 8 HOUR) 650 MG CR tablet  Take 650 mg by mouth every 8 (eight) hours as needed for pain.   Yes [provider]  albuterol (PROAIR HFA) 108 (90 Base) MCG/ACT inhaler Inhale 1 puff into the lungs every 6 (six) hours as needed for wheezing or shortness of breath.   Yes [provider]  amiodarone (PACERONE) 200 MG tablet Take 100 mg by mouth daily.   Yes [provider]  amLODipine (NORVASC) 5 MG tablet Take 5 mg by mouth daily. 11/13/14  Yes [provider]  apixaban (ELIQUIS) 2.5 MG TABS tablet Take 2.5 mg by mouth 2 (two) times daily.    Yes [provider]  atorvastatin (LIPITOR) 20 MG tablet Take 20 mg by mouth daily.   Yes [provider]  brimonidine (ALPHAGAN) 0.2 % ophthalmic solution Place 1 drop into the left eye 2 (two) times daily.   Yes [provider]  Cholecalciferol (VITAMIN D) 2000 units tablet Take 2,000 Units by mouth daily.   Yes [provider]  clotrimazole (GYNE-LOTRIMIN) 1 % vaginal cream Place 1 application vaginally daily as needed. For yeast infection. 11/15/14  Yes [provider]  glimepiride (AMARYL) 2 MG tablet Take 2 mg by mouth daily. 10/18/14  Yes [provider]  pantoprazole (PROTONIX) 40 MG tablet Take 40 mg by mouth daily.   Yes [provider]  sodium chloride (OCEAN) 0.65 % nasal spray  Place 2 sprays into the nose daily as needed. 09/14/14  Yes [provider]  travoprost, benzalkonium, (TRAVATAN) 0.004 % ophthalmic solution Place 1 drop into the left eye at bedtime.   Yes [provider]  fluconazole (DIFLUCAN) 100 MG tablet Take 1 tablet (100 mg total) by mouth daily. Patient not taking: Reported on 01/05/2018 05/18/17   Noreene Filbert, MD  nystatin (MYCOSTATIN/NYSTOP) powder Apply topically 4 (four) times daily. Patient not taking: Reported on 01/05/2018 12/10/17   Rudene Re, MD    Allergies as of 01/05/2018 - Review Complete 01/05/2018  Allergen Reaction Noted  .  Amoxicillin Other (See Comments) 12/01/2016  . Cephalexin Other (See Comments) 12/01/2016  . Nitrofurantoin Other (See Comments) 12/09/2014  . Penicillins Other (See Comments) 12/09/2014  . Sulfa antibiotics Hives and Itching 12/09/2014  . Atorvastatin  02/08/2015  . Azithromycin Rash 12/09/2014    Family History  Problem Relation Age of Onset  . Diabetes Brother   . Diabetes Son   . Stroke Mother   . Cancer Father   . Stroke Sister   . Stroke Sister   . COPD Brother   . Diabetes Son     Social History   Socioeconomic History  . Marital status: Widowed    Spouse name: Not on file  . Number of children: Not on file  . Years of education: Not on file  . Highest education level: Not on file  Occupational History  . Not on file  Social Needs  . Financial resource strain: Not on file  . Food insecurity:    Worry: Not on file    Inability: Not on file  . Transportation needs:    Medical: Not on file    Non-medical: Not on file  Tobacco Use  . Smoking status: Never Smoker  . Smokeless tobacco: Never Used  Substance and Sexual Activity  . Alcohol use: No  . Drug use: No  . Sexual activity: Never  Lifestyle  . Physical activity:    Days per week: Not on file    Minutes per session: Not on file  . Stress: Not on file  Relationships  . Social connections:    Talks on phone: Not on file    Gets together: Not on file    Attends religious service: Not on file    Active member of club or organization: Not on file    Attends meetings of clubs or organizations: Not on file    Relationship status: Not on file  . Intimate partner violence:    Fear of current or ex partner: Not on file    Emotionally abused: Not on file    Physically abused: Not on file    Forced sexual activity: Not on file  Other Topics Concern  . Not on file  Social History Narrative  . Not on file    Review of Systems: See HPI, otherwise negative ROS  Physical Exam: BP 113/66 (BP Location:  Right Arm)   Pulse 60   Temp 98.1 F (36.7 C) (Oral)   Resp 18   Ht 5\' 1"  (1.549 m)   Wt 49.5 kg   SpO2 93%   BMI 20.62 kg/m  General:   Alert,  pleasant and cooperative in NAD Head:  Normocephalic and atraumatic. Neck:  Supple; no masses or thyromegaly. Lungs:  Clear throughout to auscultation, normal respiratory effort.    Heart:  +S1, +S2, Regular rate and rhythm, No edema. Abdomen:  Soft, nontender and nondistended. Normal  bowel sounds, without guarding, and without rebound.   Neurologic:  Alert and  oriented x4;  grossly normal neurologically.  Impression/Plan: Sarah Dougherty is here for an endoscopy  to be performed for  evaluation of duodenal mass    Risks, benefits, limitations, and alternatives regarding endoscopy have been reviewed with the patient.  Questions have been answered.  All parties agreeable.   Jonathon Bellows, MD  01/08/2018, 7:30 AM

## 2018-01-08 NOTE — Progress Notes (Signed)
Hematology/Oncology Progress Note [facility] Telephone:([phone] [phone]  Patient Care Team: Perrin Maltese, MD as PCP - General (Internal Medicine) Perrin Maltese, MD as Referring Physician (Internal Medicine) Bary Castilla [place] Gleason, MD (General Surgery)   Name of the patient: Sarah Dougherty  [id number]  [birth year]  Date of visit: 01/08/18   INTERVAL HISTORY-  Status post upper endoscopy.  Which showed normal esophagus, large hiatal hernia, a medium-sized submucosal mass with no bleeding was found in the second portion of the duodenum.  Biopsy was taken with a cold forcep for histology.  Mass did not look as impressive as mentioned in the CT scan.  Rather subtotal, overlying mucosa appearing normal. Patient currently resting in the room.  Sleeping.  No family members at the bedside.   Review of systems- Review of Systems  Unable to perform ROS: Other (Sleeping)    Allergies  Allergen Reactions  . Amoxicillin Other (See Comments)    unknown  . Cephalexin Other (See Comments)    unknown  . Nitrofurantoin Other (See Comments)     Unknown  . Penicillins Other (See Comments)    Other reaction(s): Unknown  . Sulfa Antibiotics Hives and Itching  . Atorvastatin     Other reaction(s): Muscle Pain  . Azithromycin Rash    Patient Active Problem List   Diagnosis Date Noted  . Duodenal mass   . Abdominal pain   . Pancreatitis 01/05/2018  . Hip fx ([place]) 04/03/2017  . 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 COPD exacerbation ([place]) 04/07/2016  . Squamous cell carcinoma 03/19/2016  . Bradycardia 12/10/2014  . COPD (chronic obstructive pulmonary disease) ([place]) 12/09/2014     Past Medical History:  Diagnosis Date  . A-fib ([place])   . Arthritis    Osteoarthritis  . Asthma   . Chronic kidney disease    UTI  . COPD (chronic obstructive pulmonary disease) ([place])   . Coronary artery disease   . Diabetes mellitus without  complication ([place])   . GERD (gastroesophageal reflux disease)   . Glaucoma (increased eye pressure)   . HOH (hard of hearing)    Left Hearing Aid  . Hypertension   . Hyperthyroidism   . On home oxygen therapy    uses at night  . Presence of permanent cardiac pacemaker   . Sleep apnea    No C-PAP     Past Surgical History:  Procedure Laterality Date  . ABDOMINAL HYSTERECTOMY    . EYE SURGERY Left    Cataract Extraction with IOL  . EYE SURGERY Right    Artificial Eye  . HIP ARTHROPLASTY Right 04/07/2017   Procedure: ARTHROPLASTY BIPOLAR HIP (HEMIARTHROPLASTY);  Surgeon: Dereck Leep, MD;  Location: [facility] ORS;  Service: Orthopedics;  Laterality: Right;  . INSERT / REPLACE / REMOVE PACEMAKER    . PACEMAKER INSERTION Left 12/12/2014   Procedure: INSERTION PACEMAKER;  Surgeon: Isaias Cowman, MD;  Location: [facility] ORS;  Service: Cardiovascular;  Laterality: Left;  . PAROTIDECTOMY Left 12/10/2016   Procedure: PAROTIDECTOMY;  Surgeon: Clyde Canterbury, MD;  Location: [facility] ORS;  Service: ENT;  Laterality: Left;  . RADICAL NECK DISSECTION Left 12/10/2016   Procedure: RADICAL NECK DISSECTION;  Surgeon: Clyde Canterbury, MD;  Location: [facility] ORS;  Service: ENT;  Laterality: Left;    Social History   Socioeconomic History  . Marital status: Widowed    Spouse name: Not on file  . Number of children: Not on file  . Years  of education: Not on file  . Highest education level: Not on file  Occupational History  . Not on file  Social Needs  . Financial resource strain: Not on file  . Food insecurity:    Worry: Not on file    Inability: Not on file  . Transportation needs:    Medical: Not on file    Non-medical: Not on file  Tobacco Use  . Smoking status: Never Smoker  . Smokeless tobacco: Never Used  Substance and Sexual Activity  . Alcohol use: No  . Drug use: No  . Sexual activity: Never  Lifestyle  . Physical activity:    Days per week: Not on file    Minutes per session: Not  on file  . Stress: Not on file  Relationships  . Social connections:    Talks on phone: Not on file    Gets together: Not on file    Attends religious service: Not on file    Active member of club or organization: Not on file    Attends meetings of clubs or organizations: Not on file    Relationship status: Not on file  . Intimate partner violence:    Fear of current or ex partner: Not on file    Emotionally abused: Not on file    Physically abused: Not on file    Forced sexual activity: Not on file  Other Topics Concern  . Not on file  Social History Narrative  . Not on file     Family History  Problem Relation Age of Onset  . Diabetes Brother   . Diabetes Son   . Stroke Mother   . Cancer Father   . Stroke Sister   . Stroke Sister   . COPD Brother   . Diabetes Son      Current Facility-Administered Medications:  .  acetaminophen (TYLENOL) tablet 650 mg, 650 mg, Oral, Q6H PRN **OR** acetaminophen (TYLENOL) suppository 650 mg, 650 mg, Rectal, Q6H PRN, Wieting, Richard, MD .  albuterol (PROVENTIL) (2.5 MG/3ML) 0.083% nebulizer solution 3 mL, 3 mL, Inhalation, Q6H PRN, Gladstone Lighter, MD .  ALPRAZolam Duanne Moron) tablet 0.25 mg, 0.25 mg, Oral, BID PRN, Pyreddy, Pavan, MD, 0.25 mg at 01/07/18 2203 .  amiodarone (PACERONE) tablet 100 mg, 100 mg, Oral, Daily, Tressia Miners, Radhika, MD, 100 mg at 01/08/18 1016 .  amLODipine (NORVASC) tablet 5 mg, 5 mg, Oral, Daily, Tressia Miners, Radhika, MD, 5 mg at 01/08/18 1017 .  brimonidine (ALPHAGAN) 0.2 % ophthalmic solution 1 drop, 1 drop, Left Eye, BID, Gladstone Lighter, MD, 1 drop at 01/08/18 1018 .  cholecalciferol (VITAMIN D) tablet 2,000 Units, 2,000 Units, Oral, Daily, Gladstone Lighter, MD, 2,000 Units at 01/08/18 1016 .  insulin aspart (novoLOG) injection 0-9 Units, 0-9 Units, Subcutaneous, Q6H, Gladstone Lighter, MD, 5 Units at 01/08/18 1207 .  morphine 2 MG/ML injection 1 mg, 1 mg, Intravenous, Q4H PRN, Loletha Grayer, MD, 1 mg at  01/08/18 0137 .  oxyCODONE (Oxy IR/ROXICODONE) immediate release tablet 5 mg, 5 mg, Oral, Q6H PRN, Wieting, Richard, MD .  pantoprazole (PROTONIX) EC tablet 40 mg, 40 mg, Oral, Daily, Tressia Miners, Radhika, MD, 40 mg at 01/08/18 1017 .  sodium chloride (OCEAN) 0.65 % nasal spray 2 spray, 2 spray, Nasal, Daily PRN, Gladstone Lighter, MD .  Travoprost (BAK Free) (TRAVATAN) 0.004 % ophthalmic solution SOLN 1 drop, 1 drop, Left Eye, QHS, Gladstone Lighter, MD, 1 drop at 01/07/18 2018   Physical exam:  Vitals:   01/08/18 0818 01/08/18  0820 01/08/18 0943 01/08/18 1013  BP: 130/64   (!) 108/59  Pulse: (!) 59 62  (!) 59  Resp: 15 18  20   Temp:    97.8 F (36.6 C)  TempSrc:    Oral  SpO2: 94% 94% 98% 95%  Weight:      Height:       Physical Exam  Constitutional: No distress.  HENT:  Head: Normocephalic and atraumatic.  Cardiovascular: Normal rate and normal heart sounds.  Pulmonary/Chest: Effort normal.  Abdominal: Soft.  Skin: Skin is warm.       CMP Latest Ref Rng & Units 01/07/2018  Glucose 70 - 99 mg/dL 121(H)  BUN 8 - 23 mg/dL 13  Creatinine 0.44 - 1.00 mg/dL 0.77  Sodium 135 - 145 mmol/L 140  Potassium 3.5 - 5.1 mmol/L 3.8  Chloride 98 - 111 mmol/L 109  CO2 22 - 32 mmol/L 26  Calcium 8.9 - 10.3 mg/dL 8.0(L)  Total Protein 6.5 - 8.1 g/dL -  Total Bilirubin 0.3 - 1.2 mg/dL -  Alkaline Phos 38 - 126 U/L -  AST 15 - 41 U/L -  ALT 0 - 44 U/L -   CBC Latest Ref Rng & Units 01/06/2018  WBC 3.6 - 11.0 K/uL 10.4  Hemoglobin 12.0 - 16.0 g/dL 12.3  Hematocrit 35.0 - 47.0 % 36.5  Platelets 150 - 440 K/uL 258   RADIOGRAPHIC STUDIES: I have personally reviewed the radiological images as listed and agreed with the findings in the report. Ct Abdomen Pelvis W Contrast  Result Date: 01/05/2018 CLINICAL DATA:  84 year old female with a history of lower abdominal pain. EXAM: CT ABDOMEN AND PELVIS WITH CONTRAST TECHNIQUE: Multidetector CT imaging of the abdomen and pelvis was performed  using the standard protocol following bolus administration of intravenous contrast. CONTRAST:  65mL OMNIPAQUE IOHEXOL 300 MG/ML  SOLN COMPARISON:  None. FINDINGS: Lower chest: Cardiomegaly. Native coronary disease. Partially imaged cardiac pacing leads. Respiratory motion limits evaluation of the lung bases. Hepatobiliary: Liver parenchyma relatively unremarkable. Low-density cystic lesion within the right liver measures 2.4 cm, likely benign biliary cyst with no internal complexity. Trace intrahepatic biliary ductal dilatation. Surgical changes of cholecystectomy. Common hepatic duct unremarkable. The common bile duct is not well visualized, though favored to be of unremarkable diameter. The distal common bile duct and ampulla appears to be continuous with a polypoid mass projecting into the lumen of the duodenum with intermediate Hounsfield units/enhancement pattern. This measures 2.0 cm x 2.2 cm. Pancreas: Pancreas is atrophic with dilated main pancreatic duct. Low-density cyst without internal complexity or enhancement within the pancreatic head measures 3.4 cm. Mild inflammatory changes at the head of the pancreas at the pancreatic groove, in within the fascial planes posterior to the duodenum. Spleen: Unremarkable spleen Adrenals/Urinary Tract: Unremarkable adrenal glands. Bilateral kidneys with no evidence of nephrolithiasis or hydronephrosis. Subcentimeter low-density cystic lesions of the left kidney, none of which are large enough to evaluate by CT. Unremarkable course the bilateral ureters. Unremarkable urinary bladder. Stomach/Bowel: Hiatal hernia.  Otherwise unremarkable stomach. Mass continuous with the biliary ampulla measures 2.2 cm x 2.0 cm as a filling defect within the second portion the duodenum. There is circumferential duodenal thickening at the 2/3 portion of the duodenum with associated inflammatory changes of the fat. Small bowel unremarkable without transition point or evidence of  obstruction. Normal appendix. Colon decompressed. Mild diverticular change without evidence of acute inflammatory changes. Vascular/Lymphatic: Calcifications of the abdominal aorta and the iliac arteries. Calcifications of the bilateral  femoral arteries. Iliac and femoral arteries are patent. Nodularity adjacent to the duodenum may represent small implants or lymphadenopathy. No para-aortic/preaortic lymph nodes. No inguinal or iliac lymphadenopathy. Reproductive: Hysterectomy. Other: No ventral wall hernia. Musculoskeletal: Osteopenia. Multilevel degenerative changes of the thoracolumbar spine. Surgical changes of right hip arthroplasty. 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 Referral for multidisciplinary evaluation suggested, including GI for endoscopy as well as oncology. Inflammatory changes of the duodenum and the adjacent fascial planes with small local lymph nodes, likely related to tumor involvement, with or without associated pancreatitis. These results were called by telephone at the time of interpretation on 01/05/2018 at 2:23 pm to Dr. Lavonia Drafts . Pancreatic atrophy and main duct dilation, compatible with longstanding partial occlusion/stenosis. There is mild intrahepatic biliary ductal dilatation. Cyst of the pancreatic head, may represent pseudocyst or a synchronous cystic tumor. Low-density cyst of the right liver, likely benign biliary cyst. Cholecystectomy. Aortic Atherosclerosis (ICD10-I70.0). Electronically Signed   By: Corrie Mckusick D.O.   On: 01/05/2018 14:24    Assessment and plan-  Patient is a 84 y.o. 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 Submucosal mass was biopsied, if negative for malignancy will need  additional evaluation. Further management plan pending on tissue diagnosis, taking to consideration of her age and performance status.  # Acute pancreatitis, reactive?, lipase trended down. If tolerates diet, can be discharged home from oncology aspect.  She can follow up outpatient with me to discuss pathology results.  Discussed with Dr.Kalisett.    Earlie Server, MD, PhD Hematology Oncology [facility] Pager- [phone] 01/08/2018

## 2018-01-08 NOTE — Anesthesia Post-op Follow-up Note (Signed)
Anesthesia QCDR form completed.        

## 2018-01-08 NOTE — Op Note (Signed)
[facility] Gastroenterology Patient Name: Sarah Dougherty Procedure Date: 01/08/2018 7:38 AM MRN: [id number] Account #: [id number] Date of Birth: 1922/01/01 Admit Type: Inpatient Age: 84 Room: [facility] ENDO ROOM 4 Gender: Female Note Status: Finalized Procedure:            Upper GI endoscopy Indications:          Abnormal CT of the GI tract Providers:            Jonathon Bellows MD, MD Referring MD:         Perrin Maltese, MD (Referring MD) Medicines:            Monitored Anesthesia Care Complications:        No immediate complications. Procedure:            Pre-Anesthesia Assessment:                       - Prior to the procedure, a History and Physical was                        performed, and patient medications, allergies and                        sensitivities were reviewed. The patient's tolerance of                        previous anesthesia was reviewed.                       - The risks and benefits of the procedure and the                        sedation options and risks were discussed with the                        patient. All questions were answered and informed                        consent was obtained.                       - ASA Grade Assessment: III - A patient with severe                        systemic disease.                       After obtaining informed consent, the endoscope was                        passed under direct vision. Throughout the procedure,                        the patient's blood pressure, pulse, and oxygen                        saturations were monitored continuously. The Endoscope                        was introduced through the mouth, and advanced to the  third part of duodenum. The upper GI endoscopy was                        accomplished with ease. The patient tolerated the                        procedure well. Findings:      The esophagus was normal.      A large hiatal hernia was present.  The stomach was normal.      A medium-sized submucosal mass with no bleeding was found in the second       portion of the duodenum. Biopsies were taken with a cold forceps for       histology. The mass didnt look as impressive as mentioned in the CT       scan, was rather subtle , overlying mucosa appeared normal. This was       biopsied with a cold forceps for histology.      The exam was otherwise without abnormality. Impression:           - Normal esophagus.                       - Large hiatal hernia.                       - Normal stomach.                       - Rule out malignancy, duodenal mass. Biopsied.                       - The examination was otherwise normal. Recommendation:       - Return patient to hospital ward for ongoing care.                       - Advance diet as tolerated.                       - Continue present medications.                       - Await pathology results.                       - The mass was not very prominent on EGD, ?submucosal                        lesion? . If biopsies are negative for malignancy then                        suggest EUS guided repeat exam with targeted biopsies.                       Advance diet and if tolerated can go home today                       Resume anticoagulation if indicated. Procedure Code(s):    --- Professional ---                       [phone], Esophagogastroduodenoscopy, flexible, transoral;  with biopsy, single or multiple Diagnosis Code(s):    --- Professional ---                       K44.9, Diaphragmatic hernia without obstruction or                        gangrene                       K31.89, Other diseases of stomach and duodenum                       R93.3, Abnormal findings on diagnostic imaging of other                        parts of digestive tract CPT copyright 2017 [employer]. All rights reserved. The codes documented in this report are preliminary and  upon coder review may  be revised to meet current compliance requirements. Jonathon Bellows, MD Jonathon Bellows MD, MD 01/08/2018 7:54:53 AM This report has been signed electronically. Number of Addenda: 0 Note Initiated On: 01/08/2018 7:38 AM      [facility]

## 2018-01-08 NOTE — Progress Notes (Signed)
Pt is d/ced home w/son.  EGD didn't show blockage. Pt is A&O; no abd pain.  IV Removed by nurse tech.  Pt had very large BM on Wednesday. Will review d/c instructions and son will transport her home.

## 2018-01-08 NOTE — Discharge Summary (Signed)
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 A-fib ([place])   . Arthritis    Osteoarthritis  . Asthma   . Chronic kidney disease    UTI  . COPD (chronic obstructive pulmonary disease) ([place])   . Coronary artery disease   . Diabetes mellitus without complication ([place])   . GERD (gastroesophageal reflux disease)   . Glaucoma (increased eye pressure)   . HOH (hard of hearing)    Left Hearing Aid  . Hypertension   . Hyperthyroidism   . On home oxygen therapy    uses at night  . Presence of permanent cardiac pacemaker   . Sleep apnea    No C-PAP    HOSPITAL COURSE:   84y/o F with PMH significant for HTN, DM, afib on eliquis brought from home secondary to abdominal pain.  1. Acute pancreatitis- likely reactive pancreatitis- secondary to her pancreatic/duodenal mass - clinically improved- lipase normalized -Tolerating diet well.  2. 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  It seems like a submucosal mass and biopsies were done.  We will follow-up with oncology for the pathology report.  3. Afib- on amiodarone -On Eliquis for anticoagulation  4. DM-continue glimepiride  5.  Hypertension-on Norvasc  Patient has been ambulatory.  PT consulted.  No  needs at home. -Family updated at discharge  DISCHARGE CONDITIONS:   Guarded  CONSULTS OBTAINED:   GI consult by Dr. Vicente Males Oncology consult by Dr. Tasia Catchings  DRUG ALLERGIES:   Allergies  Allergen Reactions  . Amoxicillin Other (See Comments)    unknown  . Cephalexin Other (See Comments)    unknown  . Nitrofurantoin Other (See Comments)     Unknown  . Penicillins Other (See Comments)    Other reaction(s): Unknown  . Sulfa Antibiotics Hives and Itching  . Atorvastatin     Other reaction(s): Muscle Pain  . Azithromycin Rash   DISCHARGE MEDICATIONS:   Allergies as of 01/08/2018      Reactions   Amoxicillin Other (See Comments)   unknown   Cephalexin Other (See Comments)   unknown   Nitrofurantoin Other (See Comments)    Unknown   Penicillins Other (See Comments)   Other reaction(s): Unknown   Sulfa Antibiotics Hives, Itching   Atorvastatin    Other reaction(s): Muscle Pain   Azithromycin Rash      Medication List    STOP taking these medications   fluconazole 100 MG tablet Commonly known as:  DIFLUCAN   nystatin powder Commonly known as:  MYCOSTATIN/NYSTOP     TAKE these medications   amiodarone 200 MG tablet Commonly known as:  PACERONE Take 100 mg by mouth daily.   amLODipine 5 MG tablet Commonly known as:  NORVASC Take 5 mg by  mouth daily.   atorvastatin 20 MG tablet Commonly known as:  LIPITOR Take 20 mg by mouth daily.   brimonidine 0.2 % ophthalmic solution Commonly known as:  ALPHAGAN Place 1 drop into the left eye 2 (two) times daily.   clotrimazole 1 % vaginal cream Commonly known as:  GYNE-LOTRIMIN Place 1 application vaginally daily as needed. For yeast infection.   ELIQUIS 2.5 MG Tabs tablet Generic drug:  apixaban Take 2.5 mg by mouth 2 (two) times daily.   glimepiride 2 MG tablet Commonly known as:  AMARYL Take 2 mg by mouth daily.   pantoprazole 40 MG tablet Commonly known as:  PROTONIX Take 40 mg by mouth daily.   PROAIR HFA  108 (90 Base) MCG/ACT inhaler Generic drug:  albuterol Inhale 1 puff into the lungs every 6 (six) hours as needed for wheezing or shortness of breath.   sodium chloride 0.65 % nasal spray Commonly known as:  OCEAN Place 2 sprays into the nose daily as needed.   travoprost (benzalkonium) 0.004 % ophthalmic solution Commonly known as:  TRAVATAN Place 1 drop into the left eye at bedtime.   TYLENOL 8 HOUR 650 MG CR tablet Generic drug:  acetaminophen Take 650 mg by mouth every 8 (eight) hours as needed for pain.   Vitamin D 2000 units tablet Take 2,000 Units by mouth daily.        DISCHARGE INSTRUCTIONS:   1.  PCP follow-up in 2 weeks 2.  Oncology follow-up in 1 week 3.  GI follow-up in 1 to 2 weeks  DIET:   Regular diet  ACTIVITY:   Activity as tolerated  OXYGEN:   Home Oxygen: No.  Oxygen Delivery: room air  DISCHARGE LOCATION:   home   If you experience worsening of your admission symptoms, develop shortness of breath, life threatening emergency, suicidal or homicidal thoughts you must seek medical attention immediately by calling 911 or calling your MD immediately  if symptoms less severe.  You Must read complete instructions/literature along with all the possible adverse reactions/side effects for all the Medicines you take and that have been prescribed to you. Take any new Medicines after you have completely understood and accpet all the possible adverse reactions/side effects.   Please note  You were cared for by a hospitalist during your hospital stay. If you have any questions about your discharge medications or the care you received while you were in the hospital after you are discharged, you can call the unit and asked to speak with the hospitalist on call if the hospitalist that took care of you is not available. Once you are discharged, your primary care physician will handle any further medical issues. Please note that NO REFILLS for any discharge  medications will be authorized once you are discharged, as it is imperative that you return to your primary care physician (or establish a relationship with a primary care physician if you do not have one) for your aftercare needs so that they can reassess your need for medications and monitor your lab values.    On the day of Discharge:  VITAL SIGNS:   Blood pressure 94/60, pulse 60, temperature (!) 97.3 F (36.3 C), temperature source Oral, resp. rate 20, height 5\' 1"  (1.549 m), weight 49.5 kg, SpO2 95 %.  PHYSICAL EXAMINATION:    GENERAL:  84 y.o.-year-old elderly patient lying in the bed with no acute distress.  EYES: left pupil equal, round, reactive to light and accommodation. 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 No scleral icterus. Extraocular muscles intact.  HEENT: Head atraumatic, normocephalic. Oropharynx and nasopharynx clear.  NECK:  Supple, no jugular venous distention. No thyroid enlargement, no tenderness.  LUNGS: Normal breath sounds bilaterally, no wheezing, rales,rhonchi or crepitation. No use of accessory muscles of respiration. Decreased bibasilar breath sounds CARDIOVASCULAR: S1, S2 normal. No  rubs, or gallops. 3/6 systolic murmur present ABDOMEN: Soft, nontender, nondistended. Bowel sounds present. No organomegaly or mass.  EXTREMITIES: No pedal edema, cyanosis, or clubbing.  NEUROLOGIC: Cranial nerves II through XII are intact. Muscle strength 5/5 in all extremities. Sensation intact. Gait not checked.  PSYCHIATRIC: The patient is alert and oriented x 3.  SKIN: No obvious rash, lesion, or ulcer.   DATA REVIEW:   CBC Recent Labs  Lab 01/06/18 0456  WBC 10.4  HGB 12.3  HCT 36.5  PLT 258    Chemistries  Recent Labs  Lab 01/05/18 1104  01/07/18 0339  NA 136   < > 140  K 4.6   < > 3.8  CL 102   < > 109  CO2 23   < > 26  GLUCOSE 250*   < > 121*  BUN 24*   < > 13  CREATININE 1.07*   < > 0.77  CALCIUM 9.1   < > 8.0*  AST 15  --   --   ALT 15  --   --    ALKPHOS 78  --   --   BILITOT 1.5*  --   --    < > = values in this interval not displayed.     Microbiology Results  Results for orders placed or performed during the hospital encounter of 04/03/17  Surgical PCR screen     Status: None   Collection Time: 04/05/17  3:41 AM  Result Value Ref Range Status   MRSA, PCR NEGATIVE NEGATIVE Final   Staphylococcus aureus NEGATIVE NEGATIVE Final    Comment: (NOTE) The Xpert SA Assay (FDA approved for NASAL specimens in patients 54 years of age and older), is one component of a comprehensive surveillance program. It is not intended to diagnose infection nor to guide or monitor treatment. Performed at [facility], [address], [place], [place] [address]     RADIOLOGY:  No results found.   Management plans discussed with the patient, family and they are in agreement.  CODE STATUS:     Code Status Orders  (From admission, onward)         Start     Ordered   01/05/18 1537  Full code  Continuous     01/05/18 1537        Code Status History    Date Active Date Inactive Code Status Order ID Comments User Context   04/03/2017 2205 04/09/2017 2050 Full Code [id number]  Gorden Harms, MD Inpatient   12/10/2016 1327 12/12/2016 1517 Full Code [id number]  Clyde Canterbury, MD Inpatient   04/07/2016 2108 04/10/2016 1706 Full Code [id number]  Vaughan Basta, MD Inpatient   12/12/2014 1452 12/13/2014 1828 Full Code [id number]  Isaias Cowman, MD Inpatient   12/09/2014 2303 12/12/2014 1452 Full Code [id number]  Baxter Hire, MD Inpatient    Advance Directive Documentation     Most Recent Value  Type of Advance Directive  Healthcare Power of Attorney  Pre-existing out of facility DNR order (yellow form or pink MOST form)  -  "MOST" Form in Place?  -      [place]  OF THIS PATIENT: 39 minutes.    Gladstone Lighter M.D on 01/08/2018 at 4:44 PM  Between 7am to 6pm - Pager - [phone]  After  6pm go to [email or web address] - Proofreader  [employer] [facility]  Office  [phone]  CC: Primary care physician; Perrin Maltese, MD   Note: This dictation was prepared with Dragon dictation along with smaller phrase technology. Any transcriptional errors that result from this process are unintentional.

## 2018-01-11 ENCOUNTER — Encounter: Payer: Self-pay | Admitting: Gastroenterology

## 2018-01-11 LAB — SURGICAL PATHOLOGY

## 2018-01-13 ENCOUNTER — Other Ambulatory Visit: Payer: Self-pay

## 2018-01-13 ENCOUNTER — Telehealth: Payer: Self-pay | Admitting: *Deleted

## 2018-01-13 DIAGNOSIS — M810 Age-related osteoporosis without current pathological fracture: Secondary | ICD-10-CM | POA: Insufficient documentation

## 2018-01-13 DIAGNOSIS — K219 Gastro-esophageal reflux disease without esophagitis: Secondary | ICD-10-CM | POA: Insufficient documentation

## 2018-01-13 DIAGNOSIS — E785 Hyperlipidemia, unspecified: Secondary | ICD-10-CM | POA: Insufficient documentation

## 2018-01-13 DIAGNOSIS — I10 Essential (primary) hypertension: Secondary | ICD-10-CM | POA: Insufficient documentation

## 2018-01-13 DIAGNOSIS — M170 Bilateral primary osteoarthritis of knee: Secondary | ICD-10-CM | POA: Insufficient documentation

## 2018-01-13 DIAGNOSIS — N183 Chronic kidney disease, stage 3 unspecified: Secondary | ICD-10-CM | POA: Insufficient documentation

## 2018-01-13 NOTE — Telephone Encounter (Signed)
Son Sarah Dougherty called asking for results of path stating that Dr Vicente Males said he would get a call before end of today with results. He kept saying Dr Tasia Catchings was a he and Dr Vicente Males was female . I explained to him that Dr Tasia Catchings is female and that Dr Vicente Males is a female and that he needed to contact Dr Georgeann Oppenheim office if Dr Vicente Males said he would call him. He asked for the number and as I was searchin gfor the number for hiom he commented that "they don't know what they are doing over there, I hope I never get sick, I will never go there" I stated to him "I'm sorry you feel that way" to which he demanded my name and stated he was going to report me me for being a "smart ass" I told him that I did not intend any negativity and told him my name and job title. I continued my search for Dr Georgeann Oppenheim number and when I found it, I asked if he was ready for it and he said yes. I gave him the number and then he apologized to me for going off citing that he is just worried about his mother. I told him that was fine and that I understand he only wants what is best for her. I told him that if Dr Vicente Males does not contact him to call back and I would see if Dr Tasia Catchings will give him results. A short time later, I got a call from [place] stating he was on the line at their office wanting to know results and how to get where he needs to be Friday. I explained to her that he was to call Dr Vicente Males for results and gave her the number he is to call.  I printed off the path report for Dr Tasia Catchings and asked if she would please call Sarah Dougherty.She called him and gave him results

## 2018-01-13 NOTE — Patient Outreach (Signed)
[facility]) Care Management  01/13/2018  LEXA CORONADO December 21, 1921 [id number]   84 year old female outreached by [facility] services for a 30 day post discharge medication review.  PMHx includes, but not limited to, COPD, cancer of parotid gland, atrial fibrillation, type 2 diabetes mellitus, heart failure and hyperlipidemia.  Unsuccessful outreach attempt #1 to Ms. Mcelhaney.  Left HIPAA compliant voice message requesting a return call.  Plan: Outreach attempt in 3-4 business days.  Joetta Manners, PharmD Clinical Pharmacist [place] [phone]

## 2018-01-15 ENCOUNTER — Inpatient Hospital Stay: Payer: PPO | Attending: Oncology | Admitting: Oncology

## 2018-01-15 ENCOUNTER — Telehealth: Payer: Self-pay

## 2018-01-15 ENCOUNTER — Other Ambulatory Visit: Payer: Self-pay

## 2018-01-15 ENCOUNTER — Encounter: Payer: Self-pay | Admitting: Oncology

## 2018-01-15 VITALS — BP 117/67 | HR 61 | Temp 96.7°F | Resp 18 | Ht <= 58 in | Wt 106.7 lb

## 2018-01-15 DIAGNOSIS — K3189 Other diseases of stomach and duodenum: Secondary | ICD-10-CM

## 2018-01-15 DIAGNOSIS — K319 Disease of stomach and duodenum, unspecified: Secondary | ICD-10-CM | POA: Diagnosis not present

## 2018-01-15 NOTE — Telephone Encounter (Signed)
Left message on machine to call back  

## 2018-01-15 NOTE — Progress Notes (Signed)
Met with Sarah Dougherty and her son, Sarah Dougherty, following consult with Dr. Yu. They would like to pursue EUS. Educated further on EUS. First available in [place] is 11/14. They would like to have performed sooner if possible. We will send to [facility], in . They will contact Sarah Dougherty for date/time/instructions. She is on Eliquis. They were provided education on nurse navigator services and provided my contact information for future needs. Oncology Nurse Navigator Documentation  Navigator Location: [facility]Med [facility] (01/15/18 1400)   )Navigator Encounter Type: Initial MedOnc (01/15/18 1400)                     Patient Visit Type: MedOnc;Initial (01/15/18 1400)   Barriers/Navigation Needs: Coordination of Care;Education (01/15/18 1400)   Interventions: Coordination of Care;Referrals (01/15/18 1400)   Coordination of Care: EUS (01/15/18 1400)                  Time Spent with Patient: 15 (01/15/18 1400)    

## 2018-01-15 NOTE — Progress Notes (Signed)
Patient here for initial visit. Patient hard of hearing, son [name] [facility]) present.

## 2018-01-15 NOTE — Telephone Encounter (Signed)
-----   Message from Milus Banister, MD sent at 01/15/2018  2:52 PM EDT ----- Regarding: RE: EUS I reviewed her CT, EGD, path report.  Subepithelial duodenal mass about 2cm across.  Also a pancreatic head cyst.  Mucosal biopsies were non-diagnostic.  Despite advanced at (94) still reasonable continue with testing to at least make a firm diagnosis here.    She needs upper EUS radial +/- linear, +MAC sedation, next available EUS appt with myself or Dr. Rush Landmark.  She'll need to hold eliquis 2 days prior, needs the prescribing MD to ok that recommendation.  Thanks  DJ  ----- Message ----- From: Timothy Lasso, RN Sent: 01/15/2018   2:43 PM EDT To: Milus Banister, MD, Clent Jacks, RN Subject: RE: EUS                                        I will send to Dr Ardis Hughs and Dr Rush Landmark to review.  I will be in touch if we can get her in sooner.   ----- Message ----- From: Clent Jacks, RN Sent: 01/15/2018   2:38 PM EDT To: Timothy Lasso, RN Subject: EUS                                            Hi, Dr. Tasia Catchings would like an EUS to further evaluate duodenal findings on CT per patient/family request. We do not have any availability here until 11/14. They would like it performed sooner. Would you arrange with any of your providers. Contact [name] (son/POA) for instructions. She is on Eliquis. Thank you.

## 2018-01-15 NOTE — Progress Notes (Signed)
Hematology/Oncology Consult note [facility] [phone] [phone]   Patient Care Team: Perrin Maltese, MD as PCP - General (Internal Medicine) Perrin Maltese, MD as Referring Physician (Internal Medicine) Bary Castilla Forest Gleason, MD (General Surgery) Dionne Milo, [facility] as Pharmacist (Pharmacist) Clent Jacks, RN as Registered Nurse  REFERRING PROVIDER: Hospital Follow Up Dr.Byrnett CHIEF COMPLAINTS/REASON FOR VISIT:  Evaluation of duodenal mass/abnormal CT scan  HISTORY OF PRESENTING ILLNESS:  Sarah Dougherty is a  84 y.o.  female with PMH listed below who was referred to me for evaluation of abnormal CT scan. Patient presented to emergency room yesterday for evaluation of abdominal pain/right side flank pain.  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  There is also pancreatic atrophy in the main duct dilatation compatible with long-standing partial occlusion stenosis mild intrahepatic ductal dilation.  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 Patient's lipase was initially elevated at 628, trended down after IV hydration..  Pain resolved in the emergency room. Patient was evaluated by gastroenterology and underwent endoscopy [01/08/2018 ]with duodenal submucosal lesion biopsy. A medium-sized submucosal mass with no bleeding was found in the second portion of the duodenum. Biopsies were taken with a cold forceps for histology. The mass didnt look as impressive as mentioned in the CT scan, was rather subtle , overlying mucosa appeared normal. This was biopsied with a cold forceps for histology. Biopsy came back showed duodenal mucosa with nonspecific chronic duodenitis negative for dysplasia and malignancy.  Deeper sections examined.  Patient presents  to discuss pathology report and future management plan.  Accompanied by son who she lives with.  I have talked to the son over the phone during interval and communicate with him about pathology results. Patient reports feeling well today.  Denies any additional episodes of abdominal pain.  She is active as age of 78, able to do her ADLs.  Chronic hearing impairment. :   Review of Systems  Constitutional: Negative for chills, fever, malaise/fatigue and weight loss.  HENT: Positive for hearing loss. Negative for nosebleeds and sore throat.   Eyes: Negative for double vision, photophobia and redness.  Respiratory: Negative for cough, shortness of breath and wheezing.   Cardiovascular: Negative for chest pain, palpitations and orthopnea.  Gastrointestinal: Negative for abdominal pain, blood in stool, nausea and vomiting.  Genitourinary: Negative for dysuria.  Musculoskeletal: Negative for back pain, myalgias and neck pain.  Skin: Negative for itching and rash.  Neurological: Negative for dizziness, tingling and tremors.  Endo/Heme/Allergies: Negative for environmental allergies. Does not bruise/bleed easily.  Psychiatric/Behavioral: Negative for depression.    MEDICAL HISTORY:  Past Medical History:  Diagnosis Date  . A-fib ([place])   . Arthritis    Osteoarthritis  . Asthma   . Chronic kidney disease    UTI  . COPD (chronic obstructive pulmonary disease) ([place])   . Coronary artery disease   . Diabetes mellitus without complication ([place])   . GERD (gastroesophageal reflux disease)   . Glaucoma (increased eye pressure)   . HOH (hard of hearing)    Left Hearing Aid  . Hypertension   . Hyperthyroidism   . On home oxygen therapy    uses at night  . Presence of permanent cardiac pacemaker   . Sleep apnea    No C-PAP  SURGICAL HISTORY: Past Surgical History:  Procedure Laterality Date  . ABDOMINAL HYSTERECTOMY    . ESOPHAGOGASTRODUODENOSCOPY (EGD) WITH PROPOFOL N/A 01/08/2018    Procedure: ESOPHAGOGASTRODUODENOSCOPY (EGD) WITH PROPOFOL;  Surgeon: Jonathon Bellows, MD;  Location: [facility] ENDOSCOPY;  Service: Gastroenterology;  Laterality: N/A;  . EYE SURGERY Left    Cataract Extraction with IOL  . EYE SURGERY Right    Artificial Eye  . HIP ARTHROPLASTY Right 04/07/2017   Procedure: ARTHROPLASTY BIPOLAR HIP (HEMIARTHROPLASTY);  Surgeon: Dereck Leep, MD;  Location: [facility] ORS;  Service: Orthopedics;  Laterality: Right;  . INSERT / REPLACE / REMOVE PACEMAKER    . PACEMAKER INSERTION Left 12/12/2014   Procedure: INSERTION PACEMAKER;  Surgeon: Isaias Cowman, MD;  Location: [facility] ORS;  Service: Cardiovascular;  Laterality: Left;  . PAROTIDECTOMY Left 12/10/2016   Procedure: PAROTIDECTOMY;  Surgeon: Clyde Canterbury, MD;  Location: [facility] ORS;  Service: ENT;  Laterality: Left;  . RADICAL NECK DISSECTION Left 12/10/2016   Procedure: RADICAL NECK DISSECTION;  Surgeon: Clyde Canterbury, MD;  Location: [facility] ORS;  Service: ENT;  Laterality: Left;    SOCIAL HISTORY: Social History   Socioeconomic History  . Marital status: Widowed    Spouse name: Not on file  . Number of children: Not on file  . Years of education: Not on file  . Highest education level: Not on file  Occupational History  . Occupation: retired  Scientific laboratory technician  . Financial resource strain: Not on file  . Food insecurity:    Worry: Not on file    Inability: Not on file  . Transportation needs:    Medical: Not on file    Non-medical: Not on file  Tobacco Use  . Smoking status: Never Smoker  . Smokeless tobacco: Never Used  Substance and Sexual Activity  . Alcohol use: No  . Drug use: No  . Sexual activity: Never  Lifestyle  . Physical activity:    Days per week: Not on file    Minutes per session: Not on file  . Stress: Not on file  Relationships  . Social connections:    Talks on phone: Not on file    Gets together: Not on file    Attends religious service: Not on file    Active member of club or  organization: Not on file    Attends meetings of clubs or organizations: Not on file    Relationship status: Not on file  . Intimate partner violence:    Fear of current or ex partner: Not on file    Emotionally abused: Not on file    Physically abused: Not on file    Forced sexual activity: Not on file  Other Topics Concern  . Not on file  Social History Narrative  . Not on file    FAMILY HISTORY: Family History  Problem Relation Age of Onset  . Diabetes Brother   . Diabetes Son   . Stroke Mother   . Cancer Father   . Stroke Sister   . Stroke Sister   . COPD Brother   . Diabetes Son     ALLERGIES:  is allergic to amoxicillin; cephalexin; nitrofurantoin; penicillins; sulfa antibiotics; atorvastatin; and azithromycin.  MEDICATIONS:  Current Outpatient Medications  Medication Sig Dispense Refill  . acetaminophen (TYLENOL 8 HOUR) 650 MG CR tablet Take 650 mg by mouth every 8 (eight) hours as needed for pain.    Marland Kitchen albuterol (PROAIR HFA) 108 (90 Base) MCG/ACT inhaler Inhale 1 puff into the lungs every  6 (six) hours as needed for wheezing or shortness of breath.    Marland Kitchen amiodarone (PACERONE) 200 MG tablet Take 100 mg by mouth daily.    Marland Kitchen amLODipine (NORVASC) 5 MG tablet Take 5 mg by mouth daily.    Marland Kitchen apixaban (ELIQUIS) 2.5 MG TABS tablet Take 2.5 mg by mouth 2 (two) times daily.     Marland Kitchen atorvastatin (LIPITOR) 20 MG tablet Take 20 mg by mouth every other day.     . brimonidine (ALPHAGAN) 0.2 % ophthalmic solution Place 1 drop into the left eye 2 (two) times daily.    . Cholecalciferol (VITAMIN D) 2000 units tablet Take 2,000 Units by mouth daily.    . clotrimazole (GYNE-LOTRIMIN) 1 % vaginal cream Place 1 application vaginally daily as needed. For yeast infection.    Marland Kitchen glimepiride (AMARYL) 2 MG tablet Take 2 mg by mouth daily.    Marland Kitchen LORazepam (ATIVAN) 0.5 MG tablet Take 0.25 mg by mouth 2 (two) times daily as needed.  1  . pantoprazole (PROTONIX) 40 MG tablet Take 40 mg by mouth daily.     . sodium chloride (OCEAN) 0.65 % nasal spray Place 2 sprays into the nose daily as needed.    . travoprost, benzalkonium, (TRAVATAN) 0.004 % ophthalmic solution Place 1 drop into the left eye at bedtime.     No current facility-administered medications for this visit.      PHYSICAL EXAMINATION: ECOG PERFORMANCE STATUS: 1 - Symptomatic but completely ambulatory Vitals:   01/15/18 0947 01/15/18 0953  BP: 117/67   Pulse: 61   Resp: 18   Temp: (!) 96.7 F (35.9 C)   SpO2:  93%   Filed Weights   01/15/18 0947  Weight: 106 lb 11.2 oz (48.4 kg)    Physical Exam  Constitutional: She is oriented to person, place, and time. No distress.  Frail elderly female.  HENT:  Head: Normocephalic and atraumatic.  Mouth/Throat: Oropharynx is clear and moist.  Eyes: Pupils are equal, round, and reactive to light. EOM are normal. No scleral icterus.  Neck: Normal range of motion. Neck supple.  Cardiovascular: Normal rate, regular rhythm and normal heart sounds.  Pulmonary/Chest: Effort normal. No respiratory distress. She has no wheezes.  Abdominal: Soft. Bowel sounds are normal. She exhibits no distension and no mass. There is no tenderness.  Musculoskeletal: Normal range of motion. She exhibits no edema or deformity.  Neurological: She is alert and oriented to person, place, and time. No cranial nerve deficit. Coordination normal.  Skin: Skin is warm and dry. No rash noted. No erythema.  Psychiatric: She has a normal mood and affect. Her behavior is normal. Thought content normal.     LABORATORY DATA:  I have reviewed the data as listed Lab Results  Component Value Date   WBC 10.4 01/06/2018   HGB 12.3 01/06/2018   HCT 36.5 01/06/2018   MCV 89.8 01/06/2018   PLT 258 01/06/2018   Recent Labs    01/05/18 1104 01/06/18 0456 01/07/18 0339  NA 136 140 140  K 4.6 4.0 3.8  CL 102 108 109  CO2 '23 25 26  ' GLUCOSE 250* 129* 121*  BUN 24* 16 13  CREATININE 1.07* 0.71 0.77  CALCIUM  9.1 8.3* 8.0*  GFRNONAA 43* >60 >60  GFRAA 50* >60 >60  PROT 7.2  --   --   ALBUMIN 4.0  --   --   AST 15  --   --   ALT 15  --   --  ALKPHOS 78  --   --   BILITOT 1.5*  --   --    Iron/TIBC/Ferritin/ %Sat No results found for: IRON, TIBC, FERRITIN, IRONPCTSAT   CA 19.9 82 CEA 6.8   ASSESSMENT & PLAN:  1. Duodenal mass    CT was independently reviewed and discussed with patient and her son.  Pathology results from endoscopy discussed with patient 2. Ideally she would need additional diagnostic evaluation for the questionable duodenal mass/ampullary mass. Discussed with patient and his son that given patient's advanced age and fragility, she is likely not candidate for aggressive chemotherapy treatment even if a tissue diagnosis can be established.  I asked patient and her son if still want to proceed with diagnostic evaluation knowing that tissue diagnosis may not change any management plan.   Patient and son prefers to proceed with additional diagnostic evaluation.  I have notified Flossie Dibble who met patient and will schedule patient for EUS biopsy.   All questions were answered. The patient knows to call the clinic with any problems questions or concerns.  Return of visit: Follow-up to be determined Thank you for this kind referral and the opportunity to participate in the care of this patient. A copy of today's note is routed to referring provider  Total face to face encounter time for this patient visit was 76mn. >50% of the time was  spent in counseling and coordination of care.    ZEarlie Server MD, PhD Hematology Oncology CLand[facility] Medical Centerat AEl Centro Regional Medical CenterPager- [phone]/07/2017

## 2018-01-18 ENCOUNTER — Telehealth: Payer: Self-pay | Admitting: Gastroenterology

## 2018-01-18 ENCOUNTER — Telehealth: Payer: Self-pay

## 2018-01-18 NOTE — Telephone Encounter (Signed)
Juliet Rude (son) called & would like to get the results from the [facility] & talk with Dr Vicente Males

## 2018-01-18 NOTE — Telephone Encounter (Signed)
Received very lengthy voicemail from son, Sarah Dougherty, stating he does not understand why we want to do further testing on his mother. He states he spoke to a physician friend at [facility] and we do not know what we are talking about and that we are just trying to experiment on his mother. Message went on to say that we did not see the mass in her intestine that Dr. Vicente Males did and how can we do something further if we did not even see it. Dr. Vicente Males told them it was nothing and we are just trying to experiment on her. Attempted to return call to [facility] and voicemail did not pick up. It was explained to Yoe and his mother that the endoscopy was not impressive and small mass that was biopsied was negative for cancer. The EUS was offered for a definitive answer for them. If there are findings of cancer, she would not likely be a candidate for aggressive chemotherapy per Dr. Tasia Catchings. It was communicated to them that even with further diagnostic evaluation, it may not change the management plan. They elected to proceed with further diagnostics and even requested to have it done as soon as possible in [place]. I will continue to try and contact Sarah Dougherty to go over this again with him in hopes that he will have better understanding. Oncology Nurse Navigator Documentation  Navigator Location: [facility] (01/18/18 0900)   )Navigator Encounter Type: Telephone (01/18/18 0900) Telephone: Incoming Call;Outgoing Call (01/18/18 0900)                                                  Time Spent with Patient: 15 (01/18/18 0900)

## 2018-01-19 ENCOUNTER — Other Ambulatory Visit: Payer: Self-pay

## 2018-01-19 ENCOUNTER — Telehealth: Payer: Self-pay | Admitting: Gastroenterology

## 2018-01-19 ENCOUNTER — Ambulatory Visit: Payer: Self-pay

## 2018-01-19 NOTE — Patient Outreach (Signed)
[facility]) Care Management  01/19/2018  Sarah Dougherty [birth year] [phone]  84 year old female outreached by [facility] for a 30 day post discharge medication review.  PMHx includes, but not limited to, COPD, cancer of parotid gland, atrial fibrillation, type 2 diabetes mellitus, heart failure and hyperlipidemia.  Unsuccessful outreach attempt #2 to [name].  Left HIPAA compliant voice message requesting a return call.  Plan: Outreach attempt #3 in 3-4 business days.  Joetta Manners, PharmD Clinical Pharmacist [place] [phone]

## 2018-01-19 NOTE — Telephone Encounter (Signed)
Mr Keeling) would like Dr Vicente Males to call him regarding the patient.

## 2018-01-20 ENCOUNTER — Telehealth: Payer: Self-pay

## 2018-01-20 NOTE — Telephone Encounter (Signed)
Our first available is 11/13 with Dr Rush Landmark or Dr Ardis Hughs would you like [place] to schedule that?

## 2018-01-20 NOTE — Telephone Encounter (Signed)
Call placed to [facility]. Reported that they would like to proceed with further testing. He called and left voicemail asking how to get it scheduled. Ms. Ellett answered the phone and stated he was asleep. Will attempt to call back at a later time. Oncology Nurse Navigator Documentation  Navigator Location: [facility] (01/20/18 1300)   )Navigator Encounter Type: Telephone (01/20/18 1300) Telephone: [place] Call (01/20/18 1300)                                                  Time Spent with Patient: 15 (01/20/18 1300)

## 2018-01-21 ENCOUNTER — Telehealth: Payer: Self-pay

## 2018-01-21 ENCOUNTER — Other Ambulatory Visit: Payer: Self-pay

## 2018-01-21 DIAGNOSIS — K8689 Other specified diseases of pancreas: Secondary | ICD-10-CM

## 2018-01-21 DIAGNOSIS — K3189 Other diseases of stomach and duodenum: Secondary | ICD-10-CM

## 2018-01-21 NOTE — Telephone Encounter (Signed)
Clent Jacks, RN  Timothy Lasso, RN        Hi, Sarah Dougherty, son, would prefer to have it done by Dr. Ardis Hughs. Will you call him and arrange. Thank you.

## 2018-01-21 NOTE — Telephone Encounter (Signed)
Voicemail left for Sarah Dougherty to return call. EUS can be performed in [facility] 11/13 or [place] 11/14. We will confirm once he returns call.

## 2018-01-21 NOTE — Telephone Encounter (Signed)
error 

## 2018-01-21 NOTE — Telephone Encounter (Signed)
EUS has been scheduled for 02/25/18 730 am [facility] Dr Ardis Hughs.  Need to hold Eliquis.  Who manages, need to send a letter.

## 2018-01-22 ENCOUNTER — Inpatient Hospital Stay: Payer: PPO | Admitting: Oncology

## 2018-01-22 ENCOUNTER — Telehealth: Payer: Self-pay

## 2018-01-22 DIAGNOSIS — Z85831 Personal history of malignant neoplasm of soft tissue: Secondary | ICD-10-CM | POA: Diagnosis not present

## 2018-01-22 NOTE — Telephone Encounter (Signed)
Response received ok to hold Eliquis 3 days prior to procedure on 02/25/18 per Dr Humphrey Rolls.  To be scanned into Epic.  Called pt no answer no voice mail

## 2018-01-22 NOTE — Telephone Encounter (Signed)
EUS scheduled, pt instructed and medications reviewed.  Patient instructions mailed to home.  Patient to call with any questions or concerns.   Dr Humphrey Rolls is the managing provider for Eliquis.  I will send a letter for ok to hold.

## 2018-01-24 ENCOUNTER — Encounter: Payer: Self-pay | Admitting: Emergency Medicine

## 2018-01-24 ENCOUNTER — Other Ambulatory Visit: Payer: Self-pay

## 2018-01-24 ENCOUNTER — Inpatient Hospital Stay
Admission: EM | Admit: 2018-01-24 | Discharge: 2018-01-29 | DRG: 481 | Disposition: A | Discharge status: Skilled Nursing Facility | Payer: PPO | Attending: Internal Medicine | Admitting: Internal Medicine

## 2018-01-24 ENCOUNTER — Emergency Department: Payer: PPO

## 2018-01-24 DIAGNOSIS — H409 Unspecified glaucoma: Secondary | ICD-10-CM | POA: Diagnosis present

## 2018-01-24 DIAGNOSIS — I10 Essential (primary) hypertension: Secondary | ICD-10-CM | POA: Diagnosis not present

## 2018-01-24 DIAGNOSIS — H9190 Unspecified hearing loss, unspecified ear: Secondary | ICD-10-CM | POA: Diagnosis not present

## 2018-01-24 DIAGNOSIS — E119 Type 2 diabetes mellitus without complications: Secondary | ICD-10-CM | POA: Diagnosis not present

## 2018-01-24 DIAGNOSIS — Z96641 Presence of right artificial hip joint: Secondary | ICD-10-CM | POA: Diagnosis present

## 2018-01-24 DIAGNOSIS — D649 Anemia, unspecified: Secondary | ICD-10-CM | POA: Diagnosis present

## 2018-01-24 DIAGNOSIS — E785 Hyperlipidemia, unspecified: Secondary | ICD-10-CM | POA: Diagnosis present

## 2018-01-24 DIAGNOSIS — Z833 Family history of diabetes mellitus: Secondary | ICD-10-CM

## 2018-01-24 DIAGNOSIS — W1839XA Other fall on same level, initial encounter: Secondary | ICD-10-CM | POA: Diagnosis present

## 2018-01-24 DIAGNOSIS — Z7984 Long term (current) use of oral hypoglycemic drugs: Secondary | ICD-10-CM

## 2018-01-24 DIAGNOSIS — M25552 Pain in left hip: Secondary | ICD-10-CM | POA: Diagnosis present

## 2018-01-24 DIAGNOSIS — E1122 Type 2 diabetes mellitus with diabetic chronic kidney disease: Secondary | ICD-10-CM | POA: Diagnosis not present

## 2018-01-24 DIAGNOSIS — Z950 Presence of cardiac pacemaker: Secondary | ICD-10-CM

## 2018-01-24 DIAGNOSIS — Y92003 Bedroom of unspecified non-institutional (private) residence as the place of occurrence of the external cause: Secondary | ICD-10-CM | POA: Diagnosis not present

## 2018-01-24 DIAGNOSIS — Z9981 Dependence on supplemental oxygen: Secondary | ICD-10-CM

## 2018-01-24 DIAGNOSIS — I129 Hypertensive chronic kidney disease with stage 1 through stage 4 chronic kidney disease, or unspecified chronic kidney disease: Secondary | ICD-10-CM | POA: Diagnosis not present

## 2018-01-24 DIAGNOSIS — Z23 Encounter for immunization: Secondary | ICD-10-CM

## 2018-01-24 DIAGNOSIS — J441 Chronic obstructive pulmonary disease with (acute) exacerbation: Secondary | ICD-10-CM | POA: Diagnosis not present

## 2018-01-24 DIAGNOSIS — J449 Chronic obstructive pulmonary disease, unspecified: Secondary | ICD-10-CM | POA: Diagnosis not present

## 2018-01-24 DIAGNOSIS — Z7401 Bed confinement status: Secondary | ICD-10-CM | POA: Diagnosis not present

## 2018-01-24 DIAGNOSIS — I2510 Atherosclerotic heart disease of native coronary artery without angina pectoris: Secondary | ICD-10-CM | POA: Diagnosis not present

## 2018-01-24 DIAGNOSIS — I509 Heart failure, unspecified: Secondary | ICD-10-CM | POA: Diagnosis not present

## 2018-01-24 DIAGNOSIS — S72009A Fracture of unspecified part of neck of unspecified femur, initial encounter for closed fracture: Secondary | ICD-10-CM

## 2018-01-24 DIAGNOSIS — Z8679 Personal history of other diseases of the circulatory system: Secondary | ICD-10-CM | POA: Diagnosis not present

## 2018-01-24 DIAGNOSIS — Z9001 Acquired absence of eye: Secondary | ICD-10-CM | POA: Diagnosis not present

## 2018-01-24 DIAGNOSIS — I482 Chronic atrial fibrillation, unspecified: Secondary | ICD-10-CM | POA: Diagnosis present

## 2018-01-24 DIAGNOSIS — R262 Difficulty in walking, not elsewhere classified: Secondary | ICD-10-CM | POA: Diagnosis not present

## 2018-01-24 DIAGNOSIS — M80052D Age-related osteoporosis with current pathological fracture, left femur, subsequent encounter for fracture with routine healing: Secondary | ICD-10-CM | POA: Diagnosis not present

## 2018-01-24 DIAGNOSIS — M6281 Muscle weakness (generalized): Secondary | ICD-10-CM | POA: Diagnosis not present

## 2018-01-24 DIAGNOSIS — S72492A Other fracture of lower end of left femur, initial encounter for closed fracture: Secondary | ICD-10-CM | POA: Diagnosis not present

## 2018-01-24 DIAGNOSIS — S72142A Displaced intertrochanteric fracture of left femur, initial encounter for closed fracture: Principal | ICD-10-CM | POA: Diagnosis present

## 2018-01-24 DIAGNOSIS — Z79899 Other long term (current) drug therapy: Secondary | ICD-10-CM

## 2018-01-24 DIAGNOSIS — S00432A Contusion of left ear, initial encounter: Secondary | ICD-10-CM | POA: Diagnosis present

## 2018-01-24 DIAGNOSIS — R4182 Altered mental status, unspecified: Secondary | ICD-10-CM | POA: Diagnosis not present

## 2018-01-24 DIAGNOSIS — Z01818 Encounter for other preprocedural examination: Secondary | ICD-10-CM | POA: Diagnosis not present

## 2018-01-24 DIAGNOSIS — N183 Chronic kidney disease, stage 3 (moderate): Secondary | ICD-10-CM | POA: Diagnosis not present

## 2018-01-24 DIAGNOSIS — E0590 Thyrotoxicosis, unspecified without thyrotoxic crisis or storm: Secondary | ICD-10-CM | POA: Diagnosis not present

## 2018-01-24 DIAGNOSIS — R1312 Dysphagia, oropharyngeal phase: Secondary | ICD-10-CM | POA: Diagnosis not present

## 2018-01-24 DIAGNOSIS — S00432D Contusion of left ear, subsequent encounter: Secondary | ICD-10-CM | POA: Diagnosis not present

## 2018-01-24 DIAGNOSIS — S72002A Fracture of unspecified part of neck of left femur, initial encounter for closed fracture: Secondary | ICD-10-CM | POA: Diagnosis not present

## 2018-01-24 DIAGNOSIS — G4730 Sleep apnea, unspecified: Secondary | ICD-10-CM | POA: Diagnosis not present

## 2018-01-24 DIAGNOSIS — Z90710 Acquired absence of both cervix and uterus: Secondary | ICD-10-CM | POA: Diagnosis not present

## 2018-01-24 DIAGNOSIS — Z7901 Long term (current) use of anticoagulants: Secondary | ICD-10-CM

## 2018-01-24 DIAGNOSIS — S7222XA Displaced subtrochanteric fracture of left femur, initial encounter for closed fracture: Secondary | ICD-10-CM

## 2018-01-24 DIAGNOSIS — S0990XA Unspecified injury of head, initial encounter: Secondary | ICD-10-CM | POA: Diagnosis not present

## 2018-01-24 DIAGNOSIS — S199XXA Unspecified injury of neck, initial encounter: Secondary | ICD-10-CM | POA: Diagnosis not present

## 2018-01-24 DIAGNOSIS — Z9181 History of falling: Secondary | ICD-10-CM | POA: Diagnosis not present

## 2018-01-24 DIAGNOSIS — I130 Hypertensive heart and chronic kidney disease with heart failure and stage 1 through stage 4 chronic kidney disease, or unspecified chronic kidney disease: Secondary | ICD-10-CM | POA: Diagnosis not present

## 2018-01-24 DIAGNOSIS — K219 Gastro-esophageal reflux disease without esophagitis: Secondary | ICD-10-CM | POA: Diagnosis present

## 2018-01-24 DIAGNOSIS — Z970 Presence of artificial eye: Secondary | ICD-10-CM | POA: Diagnosis not present

## 2018-01-24 DIAGNOSIS — Z419 Encounter for procedure for purposes other than remedying health state, unspecified: Secondary | ICD-10-CM

## 2018-01-24 HISTORY — DX: Fracture of unspecified part of neck of unspecified femur, initial encounter for closed fracture: S72.009A

## 2018-01-24 LAB — URINALYSIS, COMPLETE (UACMP) WITH MICROSCOPIC
BACTERIA UA: NONE SEEN
Bilirubin Urine: NEGATIVE
Glucose, UA: >=500 mg/dL — AB
HGB URINE DIPSTICK: NEGATIVE
KETONES UR: NEGATIVE mg/dL
Leukocytes, UA: NEGATIVE
Nitrite: NEGATIVE
PROTEIN: NEGATIVE mg/dL
Specific Gravity, Urine: 1.011 (ref 1.005–1.030)
Squamous Epithelial / LPF: NONE SEEN (ref 0–5)
pH: 6 (ref 5.0–8.0)

## 2018-01-24 LAB — CBC WITH DIFFERENTIAL/PLATELET
ABS IMMATURE GRANULOCYTES: 0.04 10*3/uL (ref 0.00–0.07)
BASOS ABS: 0 10*3/uL (ref 0.0–0.1)
Basophils Relative: 0 %
EOS PCT: 0 %
Eosinophils Absolute: 0 10*3/uL (ref 0.0–0.5)
HEMATOCRIT: 38 % (ref 36.0–46.0)
HEMOGLOBIN: 11.7 g/dL — AB (ref 12.0–15.0)
IMMATURE GRANULOCYTES: 0 %
LYMPHS ABS: 1.5 10*3/uL (ref 0.7–4.0)
LYMPHS PCT: 16 %
MCH: 28.9 pg (ref 26.0–34.0)
MCHC: 30.8 g/dL (ref 30.0–36.0)
MCV: 93.8 fL (ref 80.0–100.0)
Monocytes Absolute: 1 10*3/uL (ref 0.1–1.0)
Monocytes Relative: 10 %
NEUTROS ABS: 6.9 10*3/uL (ref 1.7–7.7)
NEUTROS PCT: 74 %
NRBC: 0 % (ref 0.0–0.2)
Platelets: 357 10*3/uL (ref 150–400)
RBC: 4.05 MIL/uL (ref 3.87–5.11)
RDW: 14.1 % (ref 11.5–15.5)
WBC: 9.5 10*3/uL (ref 4.0–10.5)

## 2018-01-24 LAB — SURGICAL PCR SCREEN
MRSA, PCR: NEGATIVE
Staphylococcus aureus: POSITIVE — AB

## 2018-01-24 LAB — GLUCOSE, CAPILLARY
Glucose-Capillary: 166 mg/dL — ABNORMAL HIGH (ref 70–99)
Glucose-Capillary: 145 mg/dL — ABNORMAL HIGH (ref 70–99)
Glucose-Capillary: 154 mg/dL — ABNORMAL HIGH (ref 70–99)

## 2018-01-24 LAB — COMPREHENSIVE METABOLIC PANEL
ALBUMIN: 3.9 g/dL (ref 3.5–5.0)
ALT: 13 U/L (ref 0–44)
AST: 18 U/L (ref 15–41)
Alkaline Phosphatase: 68 U/L (ref 38–126)
Anion gap: 12 (ref 5–15)
BUN: 22 mg/dL (ref 8–23)
CO2: 23 mmol/L (ref 22–32)
CREATININE: 0.79 mg/dL (ref 0.44–1.00)
Calcium: 9 mg/dL (ref 8.9–10.3)
Chloride: 106 mmol/L (ref 98–111)
GFR calc Af Amer: >60 mL/min (ref >60)
GFR calc non Af Amer: >60 mL/min (ref >60)
Glucose, Bld: 194 mg/dL — ABNORMAL HIGH (ref 70–99)
Potassium: 3.8 mmol/L (ref 3.5–5.1)
SODIUM: 141 mmol/L (ref 135–145)
Total Bilirubin: 1.4 mg/dL — ABNORMAL HIGH (ref 0.3–1.2)
Total Protein: 7.1 g/dL (ref 6.5–8.1)

## 2018-01-24 LAB — TYPE AND SCREEN
ABO/RH(D): O POS
Antibody Screen: NEGATIVE

## 2018-01-24 LAB — PROTIME-INR
INR: 1.08
Prothrombin Time: 13.9 seconds (ref 11.4–15.2)

## 2018-01-24 MED ORDER — MORPHINE SULFATE (PF) 2 MG/ML IV SOLN
2.0000 mg | INTRAVENOUS | Status: DC | PRN
  Administered 2018-01-24 – 2018-01-25 (×3): 2 mg via INTRAVENOUS
  Filled 2018-01-24 (×4): qty 1

## 2018-01-24 MED ORDER — PANTOPRAZOLE SODIUM 40 MG PO TBEC
40.0000 mg | DELAYED_RELEASE_TABLET | Freq: Every day | ORAL | Status: DC
  Administered 2018-01-25 – 2018-01-29 (×4): 40 mg via ORAL
  Filled 2018-01-24 (×4): qty 1

## 2018-01-24 MED ORDER — LORAZEPAM 0.5 MG PO TABS
0.2500 mg | ORAL_TABLET | Freq: Two times a day (BID) | ORAL | Status: DC | PRN
  Administered 2018-01-25 – 2018-01-28 (×4): 0.25 mg via ORAL
  Filled 2018-01-24 (×4): qty 1

## 2018-01-24 MED ORDER — CHLORHEXIDINE GLUCONATE CLOTH 2 % EX PADS
6.0000 | MEDICATED_PAD | Freq: Every day | CUTANEOUS | Status: DC
  Administered 2018-01-25 – 2018-01-27 (×3): 6 via TOPICAL

## 2018-01-24 MED ORDER — ALBUTEROL SULFATE (2.5 MG/3ML) 0.083% IN NEBU: 2.5000 mg | INHALATION_SOLUTION | Freq: Four times a day (QID) | RESPIRATORY_TRACT | Status: DC | PRN

## 2018-01-24 MED ORDER — OXYCODONE HCL 5 MG PO TABS
5.0000 mg | ORAL_TABLET | ORAL | Status: DC | PRN
  Administered 2018-01-24 – 2018-01-25 (×4): 5 mg via ORAL
  Filled 2018-01-24 (×4): qty 1

## 2018-01-24 MED ORDER — MUPIROCIN 2 % EX OINT
1.0000 "application " | TOPICAL_OINTMENT | Freq: Two times a day (BID) | CUTANEOUS | Status: DC
  Administered 2018-01-25 – 2018-01-27 (×5): 1 via NASAL
  Filled 2018-01-24: qty 22

## 2018-01-24 MED ORDER — SODIUM CHLORIDE 0.9 % IV SOLN
INTRAVENOUS | Status: DC
  Administered 2018-01-24: 12:00:00 via INTRAVENOUS

## 2018-01-24 MED ORDER — INSULIN ASPART 100 UNIT/ML ~~LOC~~ SOLN
0.0000 [IU] | Freq: Three times a day (TID) | SUBCUTANEOUS | Status: DC
  Administered 2018-01-24: 1 [IU] via SUBCUTANEOUS
  Administered 2018-01-24 – 2018-01-25 (×2): 2 [IU] via SUBCUTANEOUS
  Administered 2018-01-25 (×2): 1 [IU] via SUBCUTANEOUS
  Administered 2018-01-26: 2 [IU] via SUBCUTANEOUS
  Administered 2018-01-26: 1 [IU] via SUBCUTANEOUS
  Administered 2018-01-26: 2 [IU] via SUBCUTANEOUS
  Administered 2018-01-27 (×3): 1 [IU] via SUBCUTANEOUS
  Administered 2018-01-28: 5 [IU] via SUBCUTANEOUS
  Administered 2018-01-28: 1 [IU] via SUBCUTANEOUS
  Administered 2018-01-29 (×2): 2 [IU] via SUBCUTANEOUS
  Filled 2018-01-24 (×15): qty 1

## 2018-01-24 MED ORDER — TRAVOPROST (BAK FREE) 0.004 % OP SOLN
1.0000 [drp] | Freq: Every day | OPHTHALMIC | Status: DC
  Administered 2018-01-28: 1 [drp] via OPHTHALMIC
  Filled 2018-01-24: qty 2.5

## 2018-01-24 MED ORDER — BRIMONIDINE TARTRATE 0.2 % OP SOLN
1.0000 [drp] | Freq: Two times a day (BID) | OPHTHALMIC | Status: DC
  Administered 2018-01-27 – 2018-01-29 (×3): 1 [drp] via OPHTHALMIC
  Filled 2018-01-24 (×2): qty 5

## 2018-01-24 MED ORDER — ALBUTEROL SULFATE HFA 108 (90 BASE) MCG/ACT IN AERS: 1.0000 | INHALATION_SPRAY | Freq: Four times a day (QID) | RESPIRATORY_TRACT | Status: DC | PRN

## 2018-01-24 MED ORDER — PNEUMOCOCCAL VAC POLYVALENT 25 MCG/0.5ML IJ INJ
0.5000 mL | INJECTION | INTRAMUSCULAR | Status: AC
  Administered 2018-01-29: 0.5 mL via INTRAMUSCULAR
  Filled 2018-01-24: qty 0.5

## 2018-01-24 MED ORDER — ATORVASTATIN CALCIUM 20 MG PO TABS
20.0000 mg | ORAL_TABLET | Freq: Every day | ORAL | Status: DC
  Administered 2018-01-25 – 2018-01-28 (×2): 20 mg via ORAL
  Filled 2018-01-24 (×4): qty 1

## 2018-01-24 MED ORDER — INSULIN ASPART 100 UNIT/ML ~~LOC~~ SOLN: 0.0000 [IU] | Freq: Every day | SUBCUTANEOUS | Status: DC

## 2018-01-24 MED ORDER — ACETAMINOPHEN 650 MG RE SUPP: 650.0000 mg | Freq: Four times a day (QID) | RECTAL | Status: DC | PRN

## 2018-01-24 MED ORDER — ACETAMINOPHEN 325 MG PO TABS
650.0000 mg | ORAL_TABLET | Freq: Four times a day (QID) | ORAL | Status: DC | PRN
  Administered 2018-01-28 – 2018-01-29 (×3): 650 mg via ORAL
  Filled 2018-01-24 (×4): qty 2

## 2018-01-24 MED ORDER — MORPHINE SULFATE (PF) 4 MG/ML IV SOLN
4.0000 mg | Freq: Once | INTRAVENOUS | Status: AC
  Administered 2018-01-24: 4 mg via INTRAVENOUS
  Filled 2018-01-24: qty 1

## 2018-01-24 MED ORDER — AMLODIPINE BESYLATE 5 MG PO TABS: 5.0000 mg | ORAL_TABLET | Freq: Every day | ORAL | Status: DC

## 2018-01-24 MED ORDER — SALINE SPRAY 0.65 % NA SOLN
2.0000 | Freq: Every day | NASAL | Status: DC | PRN
  Filled 2018-01-24: qty 44

## 2018-01-24 MED ORDER — VITAMIN D 1000 UNITS PO TABS
2000.0000 [IU] | ORAL_TABLET | Freq: Every day | ORAL | Status: DC
  Administered 2018-01-25 – 2018-01-29 (×4): 2000 [IU] via ORAL
  Filled 2018-01-24 (×4): qty 2

## 2018-01-24 MED ORDER — ONDANSETRON HCL 4 MG PO TABS: 4.0000 mg | ORAL_TABLET | Freq: Four times a day (QID) | ORAL | Status: DC | PRN

## 2018-01-24 MED ORDER — AMIODARONE HCL 200 MG PO TABS
100.0000 mg | ORAL_TABLET | Freq: Every day | ORAL | Status: DC
  Administered 2018-01-26 – 2018-01-29 (×4): 100 mg via ORAL
  Filled 2018-01-24 (×6): qty 1

## 2018-01-24 MED ORDER — ONDANSETRON HCL 4 MG/2ML IJ SOLN: 4.0000 mg | Freq: Four times a day (QID) | INTRAMUSCULAR | Status: DC | PRN

## 2018-01-24 NOTE — ED Notes (Signed)
Report to Caryl Pina, RN Request [facility] transport pt on tele

## 2018-01-24 NOTE — ED Notes (Signed)
Report to Amanda, RN.

## 2018-01-24 NOTE — ED Triage Notes (Signed)
Pt is on eliquis for AFIB

## 2018-01-24 NOTE — ED Notes (Signed)
Patient transported to CT 

## 2018-01-24 NOTE — Progress Notes (Signed)
Per MD Daine Gip pt can resume diet today and NPO after 0000,Diet order placed. Pt notified.

## 2018-01-24 NOTE — Progress Notes (Signed)
Pt. admitted to unit, room 151 from ED.Oriented to room, call bell, Ascom phones and staff. Bed in low position. Fall safety plan reviewed, non-skid socks in place, bed alarm on. Full assessment to Epic; skin assessed with Estill Bamberg, RN. Telemetry box verified with CCMD and Amanda: [id number] . Family at bedside.

## 2018-01-24 NOTE — ED Provider Notes (Signed)
[facility] Emergency Department Provider Note  ____________________________________________   First MD Initiated Contact with Patient 01/24/18 0617     (approximate)  I have reviewed the triage vital signs and the nursing notes.   HISTORY  Chief Complaint Fall    HPI Sarah Dougherty is a 84 y.o. female with extensive medical history as listed below which includes A. fib on chronic anticoagulation with Eliquis.  She presents by EMS for evaluation of acute onset and severe left hip pain after a mechanical fall at home.  It is unclear why she fell or how she fell but her son did witness the fall and says that she went down relatively lightly on carpet but had acute onset pain.  The pain was severe and she was given fentanyl by IV in route to the hospital.  The pain is now mostly relieved but her leg is shortened and externally rotated.  She has had a prior right sided hip replacement surgery by Dr. Marry Guan.  She struck the left side of her head and ear on the ground when she fell as well in the left ear is bruised.  She denies any pain except for in the hip which is worse with any kind of movement.  She denies chest pain, shortness of breath, nausea, vomiting, and abdominal pain.  Her son states that she is quite active and independent for her age, lives with him but walks around well, does her own laundry, etc.  Past Medical History:  Diagnosis Date  . A-fib ([place])   . Arthritis    Osteoarthritis  . Asthma   . Chronic kidney disease    UTI  . COPD (chronic obstructive pulmonary disease) ([place])   . Coronary artery disease   . Diabetes mellitus without complication ([place])   . GERD (gastroesophageal reflux disease)   . Glaucoma (increased eye pressure)   . HOH (hard of hearing)    Left Hearing Aid  . Hypertension   . Hyperthyroidism   . On home oxygen therapy    uses at night  . Presence of permanent cardiac pacemaker   . Sleep apnea    No C-PAP     Patient Active Problem List   Diagnosis Date Noted  . Hip fracture ([place]) 01/24/2018  . Chronic kidney disease, stage 3 ([place]) 01/13/2018  . GERD (gastroesophageal reflux disease) 01/13/2018  . Hyperlipidemia 01/13/2018  . Hypertension 01/13/2018  . Osteoarthritis of knees, bilateral 01/13/2018  . 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 Duodenal mass   . Abdominal pain   . Pancreatitis 01/05/2018  . S/P hip hemiarthroplasty 08/16/2017  . Age-related osteoporosis with current pathological fracture with routine healing 04/10/2017  . Chronic atrial fibrillation 04/10/2017  . Hip fx ([place]) 04/03/2017  . 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 COPD exacerbation ([place]) 04/07/2016  . Squamous cell carcinoma 03/19/2016  . Bradycardia 12/10/2014  . COPD (chronic obstructive pulmonary disease) ([place]) 12/09/2014  . Asthma 02/17/2014  . Congestive heart failure ([place]) 02/17/2014  . Chronic respiratory failure with hypoxia ([place]) 11/09/2013  . Benign essential hypertension 12/07/2006  . Type 2 diabetes mellitus ([place]) 12/07/2006    Past Surgical History:  Procedure Laterality Date  . ABDOMINAL HYSTERECTOMY    . ESOPHAGOGASTRODUODENOSCOPY (EGD) WITH PROPOFOL N/A 01/08/2018   Procedure: ESOPHAGOGASTRODUODENOSCOPY (EGD) WITH PROPOFOL;  Surgeon: Jonathon Bellows, MD;  Location[facility] ENDOSCOPY;  Service: Gastroenterology;  Laterality: N/A;  . EYE SURGERY Left    Cataract Extraction with IOL  .  EYE SURGERY Right    Artificial Eye  . HIP ARTHROPLASTY Right 04/07/2017   Procedure: ARTHROPLASTY BIPOLAR HIP (HEMIARTHROPLASTY);  Surgeon: Dereck Leep, MD;  Location: [facility] ORS;  Service: Orthopedics;  Laterality: Right;  . INSERT / REPLACE / REMOVE PACEMAKER    . PACEMAKER INSERTION Left 12/12/2014   Procedure: INSERTION PACEMAKER;  Surgeon: Isaias Cowman, MD;  Location: [facility] ORS;  Service: Cardiovascular;  Laterality: Left;  . PAROTIDECTOMY Left 12/10/2016   Procedure:  PAROTIDECTOMY;  Surgeon: Clyde Canterbury, MD;  Location: [facility] ORS;  Service: ENT;  Laterality: Left;  . RADICAL NECK DISSECTION Left 12/10/2016   Procedure: RADICAL NECK DISSECTION;  Surgeon: Clyde Canterbury, MD;  Location: [facility] ORS;  Service: ENT;  Laterality: Left;    Prior to Admission medications   Medication Sig Start Date End Date Taking? Authorizing Provider  acetaminophen (TYLENOL 8 HOUR) 650 MG CR tablet Take 650 mg by mouth every 8 (eight) hours as needed for pain.   Yes [provider]  albuterol (PROAIR HFA) 108 (90 Base) MCG/ACT inhaler Inhale 1 puff into the lungs every 6 (six) hours as needed for wheezing or shortness of breath.   Yes [provider]  amiodarone (PACERONE) 200 MG tablet Take 100 mg by mouth daily.   Yes [provider]  amLODipine (NORVASC) 5 MG tablet Take 5 mg by mouth daily. 11/13/14  Yes [provider]  apixaban (ELIQUIS) 2.5 MG TABS tablet Take 2.5 mg by mouth 2 (two) times daily.    Yes [provider]  atorvastatin (LIPITOR) 20 MG tablet Take 20 mg by mouth daily.    Yes [provider]  brimonidine (ALPHAGAN) 0.2 % ophthalmic solution Place 1 drop into the left eye 2 (two) times daily.   Yes [provider]  Cholecalciferol (VITAMIN D) 2000 units tablet Take 2,000 Units by mouth daily.   Yes [provider]  clotrimazole (GYNE-LOTRIMIN) 1 % vaginal cream Place 1 application vaginally daily as needed. For yeast infection. 11/15/14  Yes [provider]  glimepiride (AMARYL) 2 MG tablet Take 2 mg by mouth daily. 10/18/14  Yes [provider]  pantoprazole (PROTONIX) 40 MG tablet Take 40 mg by mouth daily.   Yes [provider]  sodium chloride (OCEAN) 0.65 % nasal spray Place 2 sprays into the nose daily as needed. 09/14/14  Yes [provider]  travoprost, benzalkonium, (TRAVATAN) 0.004 % ophthalmic solution Place 1 drop into the left eye at bedtime.   Yes [provider]  LORazepam (ATIVAN) 0.5 MG tablet Take 0.25 mg by mouth 2 (two) times daily as needed. 01/07/18   [provider]    Allergies Amoxicillin; Cephalexin; Nitrofurantoin; Penicillins; Sulfa antibiotics; Atorvastatin; and Azithromycin  Family History  Problem Relation Age of Onset  . Diabetes Brother   . Diabetes Son   . Stroke Mother   . Cancer Father   . Stroke Sister   . Stroke Sister   . COPD Brother   . Diabetes Son     Social History Social History   Tobacco Use  . Smoking status: Never Smoker  . Smokeless tobacco: Never Used  Substance Use Topics  . Alcohol use: No  . Drug use: No    Review of Systems Constitutional: No fever/chills Eyes: No visual changes. ENT: No sore throat. Cardiovascular: Denies chest pain. Respiratory: Denies shortness of breath. Gastrointestinal: No abdominal pain.  No nausea, no vomiting.  No diarrhea.  No constipation. Genitourinary: Negative for dysuria. Musculoskeletal:  Acute onset severe left hip pain as described above. Integumentary: Swelling and bruising of the left ear.  Negative for rash. Neurological: Negative for headaches, focal weakness or numbness.   ____________________________________________   PHYSICAL EXAM:  VITAL SIGNS: ED Triage Vitals  Enc Vitals Group     BP 01/24/18 0546 140/71     Pulse Rate 01/24/18 0546 74     Resp 01/24/18 0546 16     Temp 01/24/18 0546 98.4 F (36.9 C)     Temp Source 01/24/18 0546 Oral     SpO2 01/24/18 0546 90 %     Weight 01/24/18 0547 48 kg (105 lb 13.1 oz)     Height 01/24/18 0547 1.473 m (4\' 10" )     Head Circumference --      Peak Flow --      Pain Score 01/24/18 0621 0     Pain Loc --      Pain Edu? --      Excl. in [place]? --     Constitutional: Elderly, very hard of hearing, no distress at this time Eyes: Conjunctivae are normal.  False right eye. Head: Left auricular hematoma with ecchymosis throughout the majority of the external ear. Nose: No  congestion/rhinnorhea. Neck: No stridor.  No meningeal signs.  No cervical spine tenderness to palpation. Cardiovascular: Normal rate, regular rhythm. Good peripheral circulation. Grossly normal heart sounds. Respiratory: Normal respiratory effort.  No retractions. Lungs CTAB. Gastrointestinal: Soft and nontender. No distention.  Musculoskeletal: Left lower extremity is externally rotated and shortened with normal distal pulses and good perfusion.  Tenderness with any attempt at manipulation. Neurologic:  Normal speech and language. No gross focal neurologic deficits are appreciated.  Skin:  Skin is warm, dry and intact. No rash noted.  ____________________________________________   LABS (all labs ordered are listed, but only abnormal results are displayed)  Labs Reviewed  CBC WITH DIFFERENTIAL/PLATELET - Abnormal; Notable for the following components:      Result Value   Hemoglobin 11.7 (*)    All other components within normal limits  COMPREHENSIVE METABOLIC PANEL - Abnormal; Notable for the following components:   Glucose, Bld 194 (*)    Total Bilirubin 1.4 (*)    All other components within normal limits  URINALYSIS, COMPLETE (UACMP) WITH MICROSCOPIC - Abnormal; Notable for the following components:   Color, Urine STRAW (*)    APPearance CLEAR (*)    Glucose, UA >=500 (*)    All other components within normal limits  URINE CULTURE  PROTIME-INR  TYPE AND SCREEN   ____________________________________________  EKG  ED ECG REPORT I, Hinda Kehr, the attending physician, personally viewed and interpreted this ECG.  Date: 01/24/2018 EKG Time: 6:01 AM Rate: 60 Rhythm: Atrial paced rhythm QRS Axis: normal Intervals: Incomplete right bundle branch block and left anterior fascicular block ST/T Wave abnormalities: Non-specific ST segment / T-wave changes, but no evidence of acute ischemia. Narrative Interpretation: no evidence of acute  ischemia   ____________________________________________  RADIOLOGY I, Hinda Kehr, personally viewed and evaluated these images (plain radiographs) as part of my medical decision making, as well as reviewing the written report by the radiologist.  ED MD interpretation: Left displaced intertrochanteric hip fracture.  No evidence of intracranial bleeding.  Official radiology report(s): Ct Head Wo Contrast  Result Date: 01/24/2018 CLINICAL DATA:  Recent fall EXAM: CT HEAD WITHOUT CONTRAST CT CERVICAL SPINE WITHOUT CONTRAST TECHNIQUE: Multidetector CT imaging of the head and cervical spine was performed following the standard protocol without  intravenous contrast. Multiplanar CT image reconstructions of the cervical spine were also generated. COMPARISON:  03/24/2017 FINDINGS: CT HEAD FINDINGS Brain: Mild atrophic changes and chronic white matter ischemic changes are seen. No findings to suggest acute hemorrhage, acute infarction or space-occupying mass lesion are noted. Vascular: No hyperdense vessel or unexpected calcification. Skull: Burr holes are noted on the left stable in appearance from the prior exam. Sinuses/Orbits: Mild mucosal thickening is noted within the maxillary antra. No acute air-fluid level is identified. Right eye prosthesis is noted. Other: Thickening in the left ear is noted consistent with subcutaneous hematoma related to the recent injury CT CERVICAL SPINE FINDINGS Alignment: Within normal limits. Skull base and vertebrae: 7 cervical segments are well visualized. Vertebral body height is well maintained. Multilevel disc space narrowing and facet hypertrophic changes are seen. Diffuse osteophytic changes are noted as well. No acute fracture or acute facet abnormality is noted. Soft tissues and spinal canal: Mild thickening along the sternocleidomastoid on the left is noted related to the recent injury and some mild subcutaneous hemorrhage. No focal hematoma is seen. Upper chest:  Patchy atelectatic changes are noted in the right upper lobe. Other: None IMPRESSION: CT of the head: No acute intracranial abnormality is noted. Chronic atrophic changes and white matter ischemic changes are again seen. The left ear is thickened consistent with some localized hematoma related to the recent injury. CT of the cervical spine: Multilevel degenerative changes are noted without acute bony abnormality. Mild subcutaneous hemorrhage is noted in the left neck along the sternocleidomastoid consistent with the recent injury. Electronically Signed   By: Inez Catalina M.D.   On: 01/24/2018 07:30   Ct Cervical Spine Wo Contrast  Result Date: 01/24/2018 CLINICAL DATA:  Recent fall EXAM: CT HEAD WITHOUT CONTRAST CT CERVICAL SPINE WITHOUT CONTRAST TECHNIQUE: Multidetector CT imaging of the head and cervical spine was performed following the standard protocol without intravenous contrast. Multiplanar CT image reconstructions of the cervical spine were also generated. COMPARISON:  03/24/2017 FINDINGS: CT HEAD FINDINGS Brain: Mild atrophic changes and chronic white matter ischemic changes are seen. No findings to suggest acute hemorrhage, acute infarction or space-occupying mass lesion are noted. Vascular: No hyperdense vessel or unexpected calcification. Skull: Burr holes are noted on the left stable in appearance from the prior exam. Sinuses/Orbits: Mild mucosal thickening is noted within the maxillary antra. No acute air-fluid level is identified. Right eye prosthesis is noted. Other: Thickening in the left ear is noted consistent with subcutaneous hematoma related to the recent injury CT CERVICAL SPINE FINDINGS Alignment: Within normal limits. Skull base and vertebrae: 7 cervical segments are well visualized. Vertebral body height is well maintained. Multilevel disc space narrowing and facet hypertrophic changes are seen. Diffuse osteophytic changes are noted as well. No acute fracture or acute facet  abnormality is noted. Soft tissues and spinal canal: Mild thickening along the sternocleidomastoid on the left is noted related to the recent injury and some mild subcutaneous hemorrhage. No focal hematoma is seen. Upper chest: Patchy atelectatic changes are noted in the right upper lobe. Other: None IMPRESSION: CT of the head: No acute intracranial abnormality is noted. Chronic atrophic changes and white matter ischemic changes are again seen. The left ear is thickened consistent with some localized hematoma related to the recent injury. CT of the cervical spine: Multilevel degenerative changes are noted without acute bony abnormality. Mild subcutaneous hemorrhage is noted in the left neck along the sternocleidomastoid consistent with the recent injury. Electronically Signed   By:  Inez Catalina M.D.   On: 01/24/2018 07:30   Ct Hip Left Wo Contrast  Result Date: 01/24/2018 CLINICAL DATA:  Left hip fracture EXAM: CT OF THE LEFT HIP WITHOUT CONTRAST TECHNIQUE: Multidetector CT imaging of the left hip was performed according to the standard protocol. Multiplanar CT image reconstructions were also generated. COMPARISON:  01/24/2018 FINDINGS: Fracture of the inferior femoral neck just above the trochanter with approximately 90 degrees of angulation. Fracture is comminuted and impacted. Normal femoral head. Normal hip joint. Negative for joint effusion. Mild edema/hematoma in the surrounding tissues. No pelvic hematoma. Acetabulum is normal. No other pelvic fractures on the left. IMPRESSION: Fracture of the inferior femoral neck on the left with 90 degrees of angulation and fracture impaction. Normal joint space. No other fracture in the left pelvis. Electronically Signed   By: Franchot Gallo M.D.   On: 01/24/2018 07:55   Dg Chest Portable 1 View  Result Date: 01/24/2018 CLINICAL DATA:  Preoperative. Hip fracture. EXAM: PORTABLE CHEST 1 VIEW COMPARISON:  04/05/2017 FINDINGS: Shallow inspiration with elevation of  the right hemidiaphragm. Cardiac pacemaker. Heart size and pulmonary vascularity are normal for technique. No airspace disease or consolidation in the lungs. No blunting of costophrenic angles. No pneumothorax. Mediastinal contours appear intact. Calcification of the aorta. Old left rib fractures. IMPRESSION: No active disease. Electronically Signed   By: Lucienne Capers M.D.   On: 01/24/2018 06:49   Dg Hip Unilat With Pelvis 2-3 Views Left  Result Date: 01/24/2018 CLINICAL DATA:  Fall. EXAM: DG HIP (WITH OR WITHOUT PELVIS) 2-3V LEFT COMPARISON:  None. FINDINGS: Acute inter trochanteric comminuted fracture of the proximal left femur with varus angulation of the fracture fragments. Diffuse bone demineralization. Degenerative changes in the left hip and lower lumbar spine. Pelvis appears intact. SI joints and symphysis pubis are not displaced. Old right hip hemiarthroplasty. Vascular calcifications. IMPRESSION: Acute posttraumatic comminuted intertrochanteric fracture of the proximal left femur with varus angulation. Electronically Signed   By: Lucienne Capers M.D.   On: 01/24/2018 06:48    ____________________________________________   PROCEDURES  Critical Care performed: No   Procedure(s) performed:   Procedures   ____________________________________________   INITIAL IMPRESSION / ASSESSMENT AND PLAN / ED COURSE  As part of my medical decision making, I reviewed the following data within the [place] History obtained from family, Nursing notes reviewed and incorporated, Labs reviewed , EKG interpreted , Old chart reviewed, Radiograph reviewed , Discussed with admitting physician , A consult was requested and obtained from this/these consultant(s) Orthopedics and Notes from prior ED visits    Differential diagnosis includes, but is not limited to, hip fracture, syncope/collapse, intracranial bleeding, hip dislocation, hardware failure of the right hip.  Acute  infection is also possible.  Work-up is notable for an acutely displaced and angulated closed left intertrochanteric femur fracture.  I will discuss the case with orthopedics but she is on Eliquis for long-term anticoagulation.  We will place a Foley catheter.  She is in no additional pain at this time after the fentanyl by EMS.  Urinalysis is normal, CBC unremarkable, CMP within normal limits, type and screen has been ordered but is not yet completed.  CT of the head and cervical spine showed no acute or life-threatening injury such as an intracranial bleed.  Chest x-ray is clear with no sign of pneumonia.  Of note, I discussed the auricular hematoma by phone with Dr. Myna Hidalgo with ENT since the patient is an established patient with  Dr. Richardson Landry.  He agreed with me that intervening with I&D of the hematoma at this point would not be in the patient's best interest particularly since she is still on Eliquis.  He recommended no intervention at this time and either he or 1 of his colleagues will follow up with the patient subsequently.   Clinical Course as of Jan 25 800  Nancy Fetter Jan 24, 2018  0706 I spoke with Dr. Daine Gip with orthopedic surgery regarding the left intertrochanteric hip fracture.  He reviewed the images and requested that I obtain a CT scan of the hip to further characterize the nature of the fracture and help him determine if she is eligible for surgery sooner rather than later given that she is on Eliquis.  I have ordered the CT.  I will speak with the hospitalist regarding admission.     [CF]  [phone] Spoke by phone with Dr. Leslye Peer with the hospitalist service who will admit.  Discussed the workup thus far including my conversations with Drs. Eckel and Juengel.   [CF]  [phone] Patient's pain has returned, ordering morphine 4 mg IV.   [CF]    Clinical Course User Index [CF] Hinda Kehr, MD    ____________________________________________  FINAL CLINICAL IMPRESSION(S) / ED  DIAGNOSES  Final diagnoses:  Closed displaced subtrochanteric fracture of left femur, initial encounter ([place])  Traumatic hematoma of ear, left, initial encounter  Current use of long term anticoagulation     MEDICATIONS GIVEN DURING THIS VISIT:  Medications  acetaminophen (TYLENOL) tablet 650 mg (has no administration in time range)    Or  acetaminophen (TYLENOL) suppository 650 mg (has no administration in time range)  oxyCODONE (Oxy IR/ROXICODONE) immediate release tablet 5 mg (has no administration in time range)  ondansetron (ZOFRAN) tablet 4 mg (has no administration in time range)    Or  ondansetron (ZOFRAN) injection 4 mg (has no administration in time range)  morphine 2 MG/ML injection 2 mg (has no administration in time range)  morphine 4 MG/ML injection 4 mg (4 mg Intravenous Given 01/24/18 0747)     ED Discharge Orders    None       Note:  This document was prepared using Dragon voice recognition software and may include unintentional dictation errors.    Hinda Kehr, MD 01/24/18 [phone]

## 2018-01-24 NOTE — Consult Note (Signed)
ORTHOPAEDIC CONSULTATION  REQUESTING PHYSICIAN: Loletha Grayer, MD  Chief Complaint: Left hip pain  HPI: Sarah Dougherty is a 84 y.o. female who complains of  Left hip pain s/p ground level fall. She had immediate inability to bear weight after the fall. 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  Past Medical History:  Diagnosis Date  . A-fib ([place])   . Arthritis    Osteoarthritis  . Asthma   . Chronic kidney disease    UTI  . COPD (chronic obstructive pulmonary disease) ([place])   . Coronary artery disease   . Diabetes mellitus without complication ([place])   . GERD (gastroesophageal reflux disease)   . Glaucoma (increased eye pressure)   . HOH (hard of hearing)    Left Hearing Aid  . Hypertension   . Hyperthyroidism   . On home oxygen therapy    uses at night  . Presence of permanent cardiac pacemaker   . Sleep apnea    No C-PAP   Past Surgical History:  Procedure Laterality Date  . ABDOMINAL HYSTERECTOMY    . ESOPHAGOGASTRODUODENOSCOPY (EGD) WITH PROPOFOL N/A 01/08/2018   Procedure: ESOPHAGOGASTRODUODENOSCOPY (EGD) WITH PROPOFOL;  Surgeon: Jonathon Bellows, MD;  Location: [facility] END[facility]COPY;  Service: Gastroenterology;  Laterality: N/A;  . EYE SURGERY Left    Cataract Extraction with IOL  . EYE SURGERY Right    Artificial Eye  . HIP ARTHROPLASTY Right 04/07/2017   Procedure: ARTHROPLASTY BIPOLAR HIP (HEMIARTHROPLASTY);  Surgeon: Dereck Leep, MD;  Location: [facility] ORS;  Service: Orthopedics;  Laterality: Right;  . INSERT / REPLACE / REMOVE PACEMAKER    . PACEMAKER INSERTION Left 12/12/2014   Procedure: INSERTION PACEMAKER;  Surgeon: Isaias Cowman, MD;  Location: [facility] ORS;  Service: Cardiovascular;  Laterality: Left;  . PAROTIDECTOMY Left 12/10/2016   Procedure: PAROTIDECTOMY;  Surgeon: Clyde Canterbury, MD;  Location: [facility] ORS;  Service: ENT;  Laterality: Left;  . RADICAL NECK DISSECTION Left  12/10/2016   Procedure: RADICAL NECK DISSECTION;  Surgeon: Clyde Canterbury, MD;  Location: [facility] ORS;  Service: ENT;  Laterality: Left;   Social History   Socioeconomic History  . Marital status: Widowed    Spouse name: Not on file  . Number of children: Not on file  . Years of education: Not on file  . Highest education level: Not on file  Occupational History  . Occupation: retired  Scientific laboratory technician  . Financial resource strain: Not on file  . Food insecurity:    Worry: Not on file    Inability: Not on file  . Transportation needs:    Medical: Not on file    Non-medical: Not on file  Tobacco Use  . Smoking status: Never Smoker  . Smokeless tobacco: Never Used  Substance and Sexual Activity  . Alcohol use: No  . Drug use: No  . Sexual activity: Never  Lifestyle  . Physical activity:    Days per week: Not on file    Minutes per session: Not on file  . Stress: Not on file  Relationships  . Social connections:    Talks on phone: Not on file    Gets together: Not on file    Attends religious service: Not on file    Active member of club or organization: Not on file    Attends meetings of clubs or organizations: Not on file    Relationship status: Not on file  Other Topics Concern  . Not on file  Social History Narrative  . Not on file   Family History  Problem Relation Age of Onset  . Diabetes Brother   . Diabetes Son   . Stroke Mother   . Cancer Father   . Stroke Sister   . Stroke Sister   . COPD Brother   . Diabetes Son    Allergies  Allergen Reactions  . Amoxicillin Other (See Comments)    unknown  . Cephalexin Other (See Comments)    unknown  . Nitrofurantoin Other (See Comments)     Unknown  . Penicillins Other (See Comments)    Other reaction(s): Unknown  . Sulfa Antibiotics Hives and Itching  . Atorvastatin     Other reaction(s): Muscle Pain  . Azithromycin Rash   Prior to Admission medications   Medication Sig Start Date End Date Taking?  Authorizing Provider  acetaminophen (TYLENOL 8 HOUR) 650 MG CR tablet Take 650 mg by mouth every 8 (eight) hours as needed for pain.   Yes [provider]  albuterol (PROAIR HFA) 108 (90 Base) MCG/ACT inhaler Inhale 1 puff into the lungs every 6 (six) hours as needed for wheezing or shortness of breath.   Yes [provider]  amiodarone (PACERONE) 200 MG tablet Take 100 mg by mouth daily.   Yes [provider]  amLODipine (NORVASC) 5 MG tablet Take 5 mg by mouth daily. 11/13/14  Yes [provider]  apixaban (ELIQUIS) 2.5 MG TABS tablet Take 2.5 mg by mouth 2 (two) times daily.    Yes [provider]  atorvastatin (LIPITOR) 20 MG tablet Take 20 mg by mouth daily.    Yes [provider]  brimonidine (ALPHAGAN) 0.2 % ophthalmic solution Place 1 drop into the left eye 2 (two) times daily.   Yes [provider]  Cholecalciferol (VITAMIN D) 2000 units tablet Take 2,000 Units by mouth daily.   Yes [provider]  clotrimazole (GYNE-LOTRIMIN) 1 % vaginal cream Place 1 application vaginally daily as needed. For yeast infection. 11/15/14  Yes [provider]  glimepiride (AMARYL) 2 MG tablet Take 2 mg by mouth daily. 10/18/14  Yes [provider]  pantoprazole (PROTONIX) 40 MG tablet Take 40 mg by mouth daily.   Yes [provider]  sodium chloride (OCEAN) 0.65 % nasal spray Place 2 sprays into the nose daily as needed. 09/14/14  Yes [provider]  travoprost, benzalkonium, (TRAVATAN) 0.004 % ophthalmic solution Place 1 drop into the left eye at bedtime.   Yes [provider]  LORazepam (ATIVAN) 0.5 MG tablet Take 0.25 mg by mouth 2 (two) times daily as needed. 01/07/18   [provider]   Ct Head Wo Contrast  Result Date: 01/24/2018 CLINICAL DATA:  Recent fall EXAM: CT HEAD WITHOUT CONTRAST CT CERVICAL SPINE WITHOUT CONTRAST TECHNIQUE: Multidetector CT imaging of the head and cervical  spine was performed following the standard protocol without intravenous contrast. Multiplanar CT image reconstructions of the cervical spine were also generated. COMPARISON:  03/24/2017 FINDINGS: CT HEAD FINDINGS Brain: Mild atrophic changes and chronic white matter ischemic changes are seen. No findings to suggest acute hemorrhage, acute infarction or space-occupying mass lesion are noted. Vascular: No hyperdense vessel or unexpected calcification. Skull: Burr holes are noted on the left stable in appearance from the prior exam. Sinuses/Orbits: Mild mucosal thickening is noted within the maxillary antra. No acute air-fluid level is identified. Right eye prosthesis is noted. Other: Thickening  in the left ear is noted consistent with subcutaneous hematoma related to the recent injury CT CERVICAL SPINE FINDINGS Alignment: Within normal limits. Skull base and vertebrae: 7 cervical segments are well visualized. Vertebral body height is well maintained. Multilevel disc space narrowing and facet hypertrophic changes are seen. Diffuse osteophytic changes are noted as well. No acute fracture or acute facet abnormality is noted. Soft tissues and spinal canal: Mild thickening along the sternocleidomastoid on the left is noted related to the recent injury and some mild subcutaneous hemorrhage. No focal hematoma is seen. Upper chest: Patchy atelectatic changes are noted in the right upper lobe. Other: None IMPRESSION: CT of the head: No acute intracranial abnormality is noted. Chronic atrophic changes and white matter ischemic changes are again seen. The left ear is thickened consistent with some localized hematoma related to the recent injury. CT of the cervical spine: Multilevel degenerative changes are noted without acute bony abnormality. Mild subcutaneous hemorrhage is noted in the left neck along the sternocleidomastoid consistent with the recent injury. Electronically Signed   By: Inez Catalina M.D.   On: 01/24/2018 07:30    Ct Cervical Spine Wo Contrast  Result Date: 01/24/2018 CLINICAL DATA:  Recent fall EXAM: CT HEAD WITHOUT CONTRAST CT CERVICAL SPINE WITHOUT CONTRAST TECHNIQUE: Multidetector CT imaging of the head and cervical spine was performed following the standard protocol without intravenous contrast. Multiplanar CT image reconstructions of the cervical spine were also generated. COMPARISON:  03/24/2017 FINDINGS: CT HEAD FINDINGS Brain: Mild atrophic changes and chronic white matter ischemic changes are seen. No findings to suggest acute hemorrhage, acute infarction or space-occupying mass lesion are noted. Vascular: No hyperdense vessel or unexpected calcification. Skull: Burr holes are noted on the left stable in appearance from the prior exam. Sinuses/Orbits: Mild mucosal thickening is noted within the maxillary antra. No acute air-fluid level is identified. Right eye prosthesis is noted. Other: Thickening in the left ear is noted consistent with subcutaneous hematoma related to the recent injury CT CERVICAL SPINE FINDINGS Alignment: Within normal limits. Skull base and vertebrae: 7 cervical segments are well visualized. Vertebral body height is well maintained. Multilevel disc space narrowing and facet hypertrophic changes are seen. Diffuse osteophytic changes are noted as well. No acute fracture or acute facet abnormality is noted. Soft tissues and spinal canal: Mild thickening along the sternocleidomastoid on the left is noted related to the recent injury and some mild subcutaneous hemorrhage. No focal hematoma is seen. Upper chest: Patchy atelectatic changes are noted in the right upper lobe. Other: None IMPRESSION: CT of the head: No acute intracranial abnormality is noted. Chronic atrophic changes and white matter ischemic changes are again seen. The left ear is thickened consistent with some localized hematoma related to the recent injury. CT of the cervical spine: Multilevel degenerative changes are noted  without acute bony abnormality. Mild subcutaneous hemorrhage is noted in the left neck along the sternocleidomastoid consistent with the recent injury. Electronically Signed   By: Inez Catalina M.D.   On: 01/24/2018 07:30   Ct Hip Left Wo Contrast  Result Date: 01/24/2018 CLINICAL DATA:  Left hip fracture EXAM: CT OF THE LEFT HIP WITHOUT CONTRAST TECHNIQUE: Multidetector CT imaging of the left hip was performed according to the standard protocol. Multiplanar CT image reconstructions were also generated. COMPARISON:  01/24/2018 FINDINGS: Fracture of the inferior femoral neck just above the trochanter with approximately 90 degrees of angulation. Fracture is comminuted and impacted. Normal femoral head. Normal hip joint. Negative for joint effusion.  Mild edema/hematoma in the surrounding tissues. No pelvic hematoma. Acetabulum is normal. No other pelvic fractures on the left. IMPRESSION: Fracture of the inferior femoral neck on the left with 90 degrees of angulation and fracture impaction. Normal joint space. No other fracture in the left pelvis. Electronically Signed   By: Franchot Gallo M.D.   On: 01/24/2018 07:55   Dg Chest Portable 1 View  Result Date: 01/24/2018 CLINICAL DATA:  Preoperative. Hip fracture. EXAM: PORTABLE CHEST 1 VIEW COMPARISON:  04/05/2017 FINDINGS: Shallow inspiration with elevation of the right hemidiaphragm. Cardiac pacemaker. Heart size and pulmonary vascularity are normal for technique. No airspace disease or consolidation in the lungs. No blunting of costophrenic angles. No pneumothorax. Mediastinal contours appear intact. Calcification of the aorta. Old left rib fractures. IMPRESSION: No active disease. Electronically Signed   By: Lucienne Capers M.D.   On: 01/24/2018 06:49   Dg Hip Unilat With Pelvis 2-3 Views Left  Result Date: 01/24/2018 CLINICAL DATA:  Fall. EXAM: DG HIP (WITH OR WITHOUT PELVIS) 2-3V LEFT COMPARISON:  None. FINDINGS: Acute inter trochanteric comminuted  fracture of the proximal left femur with varus angulation of the fracture fragments. Diffuse bone demineralization. Degenerative changes in the left hip and lower lumbar spine. Pelvis appears intact. SI joints and symphysis pubis are not displaced. Old right hip hemiarthroplasty. Vascular calcifications. IMPRESSION: Acute posttraumatic comminuted intertrochanteric fracture of the proximal left femur with varus angulation. Electronically Signed   By: Lucienne Capers M.D.   On: 01/24/2018 06:48    Positive ROS: All other systems have been reviewed and were otherwise negative with the exception of those mentioned in the HPI and as above.  Physical Exam: General: Alert, no acute distress Cardiovascular: No pedal edema Respiratory: No cyanosis, no use of accessory musculature GI: No organomegaly, abdomen is soft and non-tender Skin: No lesions in the area of chief complaint Neurologic: Sensation intact distally Psychiatric: Patient is competent for consent with normal mood and affect Lymphatic: No axillary or cervical lymphadenopathy  MUSCULOSKELETAL: Slightly shortened, externally rotated LLE. Palpable PT pulse with intact sensation to light touch. Able to flex/ex toes and ankle.  Assessment: 84 y/o female with MMP and basicervical femoral neck fracture  Plan: Admit to hospitalist for medical optimization and risk stratification. Hold Eliquis. Proceed to surgery for cephalomedullary nail fixation pending clearance by anesthesia and hospitalist.       01/24/2018 9:29 AM

## 2018-01-24 NOTE — ED Triage Notes (Signed)
Patient presents to Emergency Department via EMS with complaints of fall with shorten and rotation to left side, injury to left ear as well    24mcg fentanyl and 4 mg zofran IV given at 534

## 2018-01-24 NOTE — H&P (Addendum)
Sarah Dougherty at [place] NAME: Sarah Dougherty    MR#:  [id number]  DATE OF BIRTH:  10-08-1921  DATE OF ADMISSION:  01/24/2018  PRIMARY CARE PHYSICIAN: Perrin Maltese, MD   REQUESTING/REFERRING PHYSICIAN: Dr Hinda Kehr  CHIEF COMPLAINT:   Chief Complaint  Patient presents with  . Fall    HISTORY OF PRESENT ILLNESS:  Sarah Dougherty  is a 84 y.o. female with a known history of orthostatic hypotension presents after a fall.  The patient states that she went to bed and she was getting up to change and probably got up too quickly and thinks she passed out.  Normally she walks with a walker.  She had a fall on the carpet and has a hematoma on the left ear and found to have a hip fracture.  Patient has severe pain in the left hip.  Unable to move her left leg secondary to pain.  Hospitalist services were contacted for further evaluation.  PAST MEDICAL HISTORY:   Past Medical History:  Diagnosis Date  . A-fib ([place])   . Arthritis    Osteoarthritis  . Asthma   . Chronic kidney disease    UTI  . COPD (chronic obstructive pulmonary disease) ([place])   . Coronary artery disease   . Diabetes mellitus without complication ([place])   . GERD (gastroesophageal reflux disease)   . Glaucoma (increased eye pressure)   . HOH (hard of hearing)    Left Hearing Aid  . Hypertension   . Hyperthyroidism   . On home oxygen therapy    uses at night  . Presence of permanent cardiac pacemaker   . Sleep apnea    No C-PAP    PAST SURGICAL HISTORY:   Past Surgical History:  Procedure Laterality Date  . ABDOMINAL HYSTERECTOMY    . ESOPHAGOGASTRODUODENOSCOPY (EGD) WITH PROPOFOL N/A 01/08/2018   Procedure: ESOPHAGOGASTRODUODENOSCOPY (EGD) WITH PROPOFOL;  Surgeon: Jonathon Bellows, MD;  Location: [facility] ENDOSCOPY;  Service: Gastroenterology;  Laterality: N/A;  . EYE SURGERY Left    Cataract Extraction with IOL  . EYE SURGERY Right    Artificial Eye  . HIP  ARTHROPLASTY Right 04/07/2017   Procedure: ARTHROPLASTY BIPOLAR HIP (HEMIARTHROPLASTY);  Surgeon: Dereck Leep, MD;  Location: [facility] ORS;  Service: Orthopedics;  Laterality: Right;  . INSERT / REPLACE / REMOVE PACEMAKER    . PACEMAKER INSERTION Left 12/12/2014   Procedure: INSERTION PACEMAKER;  Surgeon: Isaias Cowman, MD;  Location: [facility] ORS;  Service: Cardiovascular;  Laterality: Left;  . PAROTIDECTOMY Left 12/10/2016   Procedure: PAROTIDECTOMY;  Surgeon: Clyde Canterbury, MD;  Location: [facility] ORS;  Service: ENT;  Laterality: Left;  . RADICAL NECK DISSECTION Left 12/10/2016   Procedure: RADICAL NECK DISSECTION;  Surgeon: Clyde Canterbury, MD;  Location: [facility] ORS;  Service: ENT;  Laterality: Left;    SOCIAL HISTORY:   Social History   Tobacco Use  . Smoking status: Never Smoker  . Smokeless tobacco: Never Used  Substance Use Topics  . Alcohol use: No    FAMILY HISTORY:   Family History  Problem Relation Age of Onset  . Diabetes Brother   . Diabetes Son   . Stroke Mother   . Cancer Father   . Stroke Sister   . Stroke Sister   . COPD Brother   . Diabetes Son     DRUG ALLERGIES:   Allergies  Allergen Reactions  . Amoxicillin Other (See Comments)    unknown  . Cephalexin Other (  See Comments)    unknown  . Nitrofurantoin Other (See Comments)     Unknown  . Penicillins Other (See Comments)    Other reaction(s): Unknown  . Sulfa Antibiotics Hives and Itching  . Atorvastatin     Other reaction(s): Muscle Pain  . Azithromycin Rash    REVIEW OF SYSTEMS:  CONSTITUTIONAL: No fever, fatigue or weakness.  EYES: Poor vision especially right eye EARS, NOSE, AND THROAT: No tinnitus or ear pain. No sore throat.  Decreased hearing. RESPIRATORY: No cough, shortness of breath, wheezing or hemoptysis.  CARDIOVASCULAR: No chest pain, orthopnea, edema.  GASTROINTESTINAL: No nausea, vomiting, diarrhea or abdominal pain. No blood in bowel movements GENITOURINARY: No dysuria,  hematuria.  ENDOCRINE: No polyuria, nocturia,  HEMATOLOGY: No anemia, easy bruising or bleeding SKIN: No rash or lesion. MUSCULOSKELETAL: Positive for leg and back pain NEUROLOGIC: No tingling, numbness, weakness.  PSYCHIATRY: No anxiety or depression.   MEDICATIONS AT HOME:   Prior to Admission medications   Medication Sig Start Date End Date Taking? Authorizing Provider  acetaminophen (TYLENOL 8 HOUR) 650 MG CR tablet Take 650 mg by mouth every 8 (eight) hours as needed for pain.   Yes [provider]  albuterol (PROAIR HFA) 108 (90 Base) MCG/ACT inhaler Inhale 1 puff into the lungs every 6 (six) hours as needed for wheezing or shortness of breath.   Yes [provider]  amiodarone (PACERONE) 200 MG tablet Take 100 mg by mouth daily.   Yes [provider]  amLODipine (NORVASC) 5 MG tablet Take 5 mg by mouth daily. 11/13/14  Yes [provider]  apixaban (ELIQUIS) 2.5 MG TABS tablet Take 2.5 mg by mouth 2 (two) times daily.    Yes [provider]  atorvastatin (LIPITOR) 20 MG tablet Take 20 mg by mouth daily.    Yes [provider]  brimonidine (ALPHAGAN) 0.2 % ophthalmic solution Place 1 drop into the left eye 2 (two) times daily.   Yes [provider]  Cholecalciferol (VITAMIN D) 2000 units tablet Take 2,000 Units by mouth daily.   Yes [provider]  clotrimazole (GYNE-LOTRIMIN) 1 % vaginal cream Place 1 application vaginally daily as needed. For yeast infection. 11/15/14  Yes [provider]  glimepiride (AMARYL) 2 MG tablet Take 2 mg by mouth daily. 10/18/14  Yes [provider]  pantoprazole (PROTONIX) 40 MG tablet Take 40 mg by mouth daily.   Yes [provider]  sodium chloride (OCEAN) 0.65 % nasal spray Place 2 sprays into the nose daily as needed. 09/14/14  Yes [provider]  travoprost, benzalkonium, (TRAVATAN) 0.004 % ophthalmic solution Place 1 drop into the left eye at  bedtime.   Yes [provider]  LORazepam (ATIVAN) 0.5 MG tablet Take 0.25 mg by mouth 2 (two) times daily as needed. 01/07/18   [provider]      VITAL SIGNS:  Blood pressure 140/71, pulse 74, temperature 98.4 F (36.9 C), temperature source Oral, resp. rate 16, height 4\' 10"  (1.473 m), weight 48 kg, SpO2 90 %.  PHYSICAL EXAMINATION:  GENERAL:  84 y.o.-year-old patient lying in the bed with no acute distress.  EYES: Right pupil clouded over.  Left pupil reactive to light. No scleral icterus. Extraocular muscles intact.  HEENT: Head atraumatic, normocephalic. Oropharynx and nasopharynx clear.  Left ear hematoma NECK:  Supple, no jugular venous distention. No thyroid enlargement, no tenderness.  LUNGS: Normal breath sounds bilaterally, no wheezing, rales,rhonchi or crepitation. No use of accessory  muscles of respiration.  CARDIOVASCULAR: S1, S2 normal.  3 out of 6 systolic murmurs.  No rubs, or gallops.  ABDOMEN: Soft, nontender, nondistended. Bowel sounds present. No organomegaly or mass.  EXTREMITIES: No pedal edema, cyanosis, or clubbing.  Left leg shortened and externally rotated NEUROLOGIC: Cranial nerves II through XII are intact.  Patient able to flex and extend at left ankle sensation intact. Gait not checked.  PSYCHIATRIC: The patient is alert and answers questions appropriately.  SKIN: No rash, lesion, or ulcer.   LABORATORY PANEL:   CBC Recent Labs  Lab 01/24/18 0602  WBC 9.5  HGB 11.7*  HCT 38.0  PLT 357   ------------------------------------------------------------------------------------------------------------------  Chemistries  Recent Labs  Lab 01/24/18 0602  NA 141  K 3.8  CL 106  CO2 23  GLUCOSE 194*  BUN 22  CREATININE 0.79  CALCIUM 9.0  AST 18  ALT 13  ALKPHOS 68  BILITOT 1.4*   ------------------------------------------------------------------------------------------------------------------    RADIOLOGY:  Ct Head Wo  Contrast  Result Date: 01/24/2018 CLINICAL DATA:  Recent fall EXAM: CT HEAD WITHOUT CONTRAST CT CERVICAL SPINE WITHOUT CONTRAST TECHNIQUE: Multidetector CT imaging of the head and cervical spine was performed following the standard protocol without intravenous contrast. Multiplanar CT image reconstructions of the cervical spine were also generated. COMPARISON:  03/24/2017 FINDINGS: CT HEAD FINDINGS Brain: Mild atrophic changes and chronic white matter ischemic changes are seen. No findings to suggest acute hemorrhage, acute infarction or space-occupying mass lesion are noted. Vascular: No hyperdense vessel or unexpected calcification. Skull: Burr holes are noted on the left stable in appearance from the prior exam. Sinuses/Orbits: Mild mucosal thickening is noted within the maxillary antra. No acute air-fluid level is identified. Right eye prosthesis is noted. Other: Thickening in the left ear is noted consistent with subcutaneous hematoma related to the recent injury CT CERVICAL SPINE FINDINGS Alignment: Within normal limits. Skull base and vertebrae: 7 cervical segments are well visualized. Vertebral body height is well maintained. Multilevel disc space narrowing and facet hypertrophic changes are seen. Diffuse osteophytic changes are noted as well. No acute fracture or acute facet abnormality is noted. Soft tissues and spinal canal: Mild thickening along the sternocleidomastoid on the left is noted related to the recent injury and some mild subcutaneous hemorrhage. No focal hematoma is seen. Upper chest: Patchy atelectatic changes are noted in the right upper lobe. Other: None IMPRESSION: CT of the head: No acute intracranial abnormality is noted. Chronic atrophic changes and white matter ischemic changes are again seen. The left ear is thickened consistent with some localized hematoma related to the recent injury. CT of the cervical spine: Multilevel degenerative changes are noted without acute bony  abnormality. Mild subcutaneous hemorrhage is noted in the left neck along the sternocleidomastoid consistent with the recent injury. Electronically Signed   By: Inez Catalina M.D.   On: 01/24/2018 07:30   Ct Cervical Spine Wo Contrast  Result Date: 01/24/2018 CLINICAL DATA:  Recent fall EXAM: CT HEAD WITHOUT CONTRAST CT CERVICAL SPINE WITHOUT CONTRAST TECHNIQUE: Multidetector CT imaging of the head and cervical spine was performed following the standard protocol without intravenous contrast. Multiplanar CT image reconstructions of the cervical spine were also generated. COMPARISON:  03/24/2017 FINDINGS: CT HEAD FINDINGS Brain: Mild atrophic changes and chronic white matter ischemic changes are seen. No findings to suggest acute hemorrhage, acute infarction or space-occupying mass lesion are noted. Vascular: No hyperdense vessel or unexpected calcification. Skull: Burr holes are noted on the left stable in appearance from  the prior exam. Sinuses/Orbits: Mild mucosal thickening is noted within the maxillary antra. No acute air-fluid level is identified. Right eye prosthesis is noted. Other: Thickening in the left ear is noted consistent with subcutaneous hematoma related to the recent injury CT CERVICAL SPINE FINDINGS Alignment: Within normal limits. Skull base and vertebrae: 7 cervical segments are well visualized. Vertebral body height is well maintained. Multilevel disc space narrowing and facet hypertrophic changes are seen. Diffuse osteophytic changes are noted as well. No acute fracture or acute facet abnormality is noted. Soft tissues and spinal canal: Mild thickening along the sternocleidomastoid on the left is noted related to the recent injury and some mild subcutaneous hemorrhage. No focal hematoma is seen. Upper chest: Patchy atelectatic changes are noted in the right upper lobe. Other: None IMPRESSION: CT of the head: No acute intracranial abnormality is noted. Chronic atrophic changes and white  matter ischemic changes are again seen. The left ear is thickened consistent with some localized hematoma related to the recent injury. CT of the cervical spine: Multilevel degenerative changes are noted without acute bony abnormality. Mild subcutaneous hemorrhage is noted in the left neck along the sternocleidomastoid consistent with the recent injury. Electronically Signed   By: Inez Catalina M.D.   On: 01/24/2018 07:30   Ct Hip Left Wo Contrast  Result Date: 01/24/2018 CLINICAL DATA:  Left hip fracture EXAM: CT OF THE LEFT HIP WITHOUT CONTRAST TECHNIQUE: Multidetector CT imaging of the left hip was performed according to the standard protocol. Multiplanar CT image reconstructions were also generated. COMPARISON:  01/24/2018 FINDINGS: Fracture of the inferior femoral neck just above the trochanter with approximately 90 degrees of angulation. Fracture is comminuted and impacted. Normal femoral head. Normal hip joint. Negative for joint effusion. Mild edema/hematoma in the surrounding tissues. No pelvic hematoma. Acetabulum is normal. No other pelvic fractures on the left. IMPRESSION: Fracture of the inferior femoral neck on the left with 90 degrees of angulation and fracture impaction. Normal joint space. No other fracture in the left pelvis. Electronically Signed   By: Franchot Gallo M.D.   On: 01/24/2018 07:55   Dg Chest Portable 1 View  Result Date: 01/24/2018 CLINICAL DATA:  Preoperative. Hip fracture. EXAM: PORTABLE CHEST 1 VIEW COMPARISON:  04/05/2017 FINDINGS: Shallow inspiration with elevation of the right hemidiaphragm. Cardiac pacemaker. Heart size and pulmonary vascularity are normal for technique. No airspace disease or consolidation in the lungs. No blunting of costophrenic angles. No pneumothorax. Mediastinal contours appear intact. Calcification of the aorta. Old left rib fractures. IMPRESSION: No active disease. Electronically Signed   By: Lucienne Capers M.D.   On: 01/24/2018 06:49   Dg  Hip Unilat With Pelvis 2-3 Views Left  Result Date: 01/24/2018 CLINICAL DATA:  Fall. EXAM: DG HIP (WITH OR WITHOUT PELVIS) 2-3V LEFT COMPARISON:  None. FINDINGS: Acute inter trochanteric comminuted fracture of the proximal left femur with varus angulation of the fracture fragments. Diffuse bone demineralization. Degenerative changes in the left hip and lower lumbar spine. Pelvis appears intact. SI joints and symphysis pubis are not displaced. Old right hip hemiarthroplasty. Vascular calcifications. IMPRESSION: Acute posttraumatic comminuted intertrochanteric fracture of the proximal left femur with varus angulation. Electronically Signed   By: Lucienne Capers M.D.   On: 01/24/2018 06:48    EKG:   Paced 60 bpm  IMPRESSION AND PLAN:   1.  Preoperative consultation for left hip fracture closed initial encounter.  The patient is on Eliquis.  I will hold this medication.  The orthopedic surgeon  is going to review the CT scan today to see if he can do a pinning today while on Eliquis.  I will keep n.p.o. until he reviews the CAT scan and makes that decision.  If not then the patient will have to wait 2 days before surgery with Eliquis out of the system.  No contraindication to operating on a weightbearing bone.  Timing of the surgery up to orthopedic surgery because the patient is on Eliquis.  Mortality high within the first year of hip fracture especially if they do not get operated on and walk again. 2.  History of orthostatic hypotension.  Cut back on Norvasc and hold Norvasc today.  Will be unable to check that.  Monitor on telemetry. 3.  Chronic atrial fibrillation.  Hold Eliquis.  Stroke risk higher off Eliquis.  Continue amiodarone low-dose. 4.  Type 2 diabetes mellitus.  Put on sliding scale and hold glimepiride at this point 5.  Hyperlipidemia unspecified on Lipitor 6.  GERD on PPI 7.  Glaucoma continue eyedrops 8.  Left ear hematoma.  Hold Eliquis for right now.  Continue to monitor mental  status closely.  CT scan of the head negative. 9.  Patient is a full code    All the records are reviewed and case discussed with ED provider. Management plans discussed with the patient, family and they are in agreement.  CODE STATUS: Full code  TOTAL TIME TAKING CARE OF THIS PATIENT: 50 minutes, including ACP time.    Loletha Grayer M.D on 01/24/2018 at 8:04 AM  Between 7am to 6pm - Pager - [phone]  After 6pm call admission pager [phone]  [facility] Office  [phone]  CC: Primary care physician; Perrin Maltese, MD

## 2018-01-24 NOTE — Progress Notes (Signed)
Patient ID: Sarah Dougherty, female   DOB: [birth year], 84 y.o.   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  CODE STATUS discussed with family because the son said this question will upset her.  Plan.  Orthopedic consultation for hip fracture and repair.  High mortality within the first year of hip fracture especially if patients do not get up and walk again.  Time spent on ACP discussion 17 minutes Dr. Loletha Grayer

## 2018-01-25 ENCOUNTER — Ambulatory Visit: Payer: Self-pay

## 2018-01-25 ENCOUNTER — Other Ambulatory Visit: Payer: Self-pay

## 2018-01-25 LAB — BASIC METABOLIC PANEL
Anion gap: 8 (ref 5–15)
BUN: 25 mg/dL — ABNORMAL HIGH (ref 8–23)
CHLORIDE: 111 mmol/L (ref 98–111)
CO2: 24 mmol/L (ref 22–32)
CREATININE: 0.76 mg/dL (ref 0.44–1.00)
Calcium: 8.2 mg/dL — ABNORMAL LOW (ref 8.9–10.3)
GFR calc non Af Amer: >60 mL/min (ref >60)
Glucose, Bld: 168 mg/dL — ABNORMAL HIGH (ref 70–99)
Potassium: 3.9 mmol/L (ref 3.5–5.1)
Sodium: 143 mmol/L (ref 135–145)

## 2018-01-25 LAB — CBC
HEMATOCRIT: 30.5 % — AB (ref 36.0–46.0)
Hemoglobin: 9.1 g/dL — ABNORMAL LOW (ref 12.0–15.0)
MCH: 28.7 pg (ref 26.0–34.0)
MCHC: 29.8 g/dL — ABNORMAL LOW (ref 30.0–36.0)
MCV: 96.2 fL (ref 80.0–100.0)
NRBC: 0 % (ref 0.0–0.2)
Platelets: 286 10*3/uL (ref 150–400)
RBC: 3.17 MIL/uL — ABNORMAL LOW (ref 3.87–5.11)
RDW: 14.4 % (ref 11.5–15.5)
WBC: 9.7 10*3/uL (ref 4.0–10.5)

## 2018-01-25 LAB — GLUCOSE, CAPILLARY
GLUCOSE-CAPILLARY: 151 mg/dL — AB (ref 70–99)
Glucose-Capillary: 148 mg/dL — ABNORMAL HIGH (ref 70–99)
Glucose-Capillary: 155 mg/dL — ABNORMAL HIGH (ref 70–99)
Glucose-Capillary: 150 mg/dL — ABNORMAL HIGH (ref 70–99)

## 2018-01-25 LAB — URINE CULTURE: Culture: NO GROWTH

## 2018-01-25 NOTE — Clinical Social Work Note (Signed)
Clinical Social Work Assessment  Patient Details  Name: ETHELYN CERNIGLIA MRN: [id number] Date of Birth: July 30, 1921  Date of referral:  01/25/18               Reason for consult:  Facility Placement                Permission sought to share information with:  Chartered certified accountant granted to share information::  Yes, Verbal Permission Granted  Name::      [place]::   [place]   Relationship::     Contact Information:     Housing/Transportation Living arrangements for the past 2 months:  [place] of Information:  Adult Children, Other (Comment Required)(Sister in Sports coach ) Patient Interpreter Needed:  None Criminal Activity/Legal Involvement Pertinent to Current Situation/Hospitalization:  No - Comment as needed Significant Relationships:  Adult Children Lives with:  Adult Children Do you feel safe going back to the place where you live?  Yes Need for family participation in patient care:  Yes (Comment)  Care giving concerns:  Patient lives in [place] with her son [name].    Social Worker assessment / plan:  Holiday representative ([place]) reviewed chart and noted that patient has a hip fracture. Per RN patient will have surgery tomorrow with Dr. Marry Guan. CSW met with patient and her sister in law was at bedside. Patient was asleep and did not participate in assessment. Per sister in law patient lives in [place] with her son [name] and her son [name] is her HPOA. Per sister in law the family would like to take patient home with home health after surgery. CSW explained that after surgery PT will evaluate patient and make a recommendation of home health or SNF. CSW explained that Health Team will have to approve SNF. Patient went to [facility] in December 2018 after a hip fracture. Per sister in law patient's sons will make the decision about the D/C plan. CSW left patient's son [name] a Advertising account executive. CSW contacted patient's son  [name] and made him aware of above. Per [name] is on vacation in [place] this week and did not know patient was in the hospital. CSW encouraged [name] to contact is brother [name] for more information. CSW will continue to follow and assist as needed.    Employment status:  Retired Nurse, adult PT Recommendations:  Not assessed at this time Information / Referral to community resources:  [place]  Patient/Family's Response to care:  Patient's family prefers to take patient home with home health.   Patient/Family's Understanding of and Emotional Response to Diagnosis, Current Treatment, and Prognosis:  Patient's sister in law was very pleasant and thanked CSW for assistance.   Emotional Assessment Appearance:  Appears stated age Attitude/Demeanor/Rapport:    Affect (typically observed):  Accepting, Adaptable, Pleasant Orientation:  Oriented to Self, Oriented to Place, Oriented to  Time, Oriented to Situation Alcohol / Substance use:  Not Applicable Psych involvement (Current and /or in the community):  No (Comment)  Discharge Needs  Concerns to be addressed:  Discharge Planning Concerns Readmission within the last 30 days:  No Current discharge risk:  Dependent with Mobility Barriers to Discharge:  Continued Medical Work up   [employer], Veronia Beets, LCSW 01/25/2018, 10:15 AM

## 2018-01-25 NOTE — Telephone Encounter (Signed)
Left message on machine to call back letter mailed with recommendation to hold Eliquis 3 days prior to procedure

## 2018-01-25 NOTE — Progress Notes (Signed)
Subjective :Patient is day 2 fall injuring left hip Patient reports pain as moderate.   no nausea and no vomiting Denies any chest pains or shortness of breath. Buck's traction to left leg has been applied Family member states that she has been talking out of her head occasionally but appeared to be alert during this visit.   Objective: Vital signs in last 24 hours: Temp:  [97.4 F (36.3 C)-98.6 F (37 C)] 97.4 F (36.3 C) (10/14 0747) Pulse Rate:  [59-60] 60 (10/14 0747) Resp:  [16-19] 16 (10/14 0747) BP: (111-134)/(51-61) 134/58 (10/14 0747) SpO2:  [87 %-99 %] 99 % (10/14 0747)  Intake/Output from previous day: 10/13 0701 - 10/14 0700 In: 582.7 [I.V.:582.7] Out: 200 [Urine:200] Intake/Output this shift: No intake/output data recorded.  Recent Labs    01/24/18 0602 01/25/18 0257  HGB 11.7* 9.1*   Recent Labs    01/24/18 0602 01/25/18 0257  WBC 9.5 9.7  RBC 4.05 3.17*  HCT 38.0 30.5*  PLT 357 286   Recent Labs    01/24/18 0602 01/25/18 0257  NA 141 143  K 3.8 3.9  CL 106 111  CO2 23 24  BUN 22 25*  CREATININE 0.79 0.76  GLUCOSE 194* 168*  CALCIUM 9.0 8.2*   Recent Labs    01/24/18 0602  INR 1.08    Neurologically intact Neurovascular intact Sensation intact distally Intact pulses distally  Assessment/Plan: Femoral neck fracture left hip Case management to assist with discharge planning Bowel movement today Labs in am Plan to discharge 2 days postop. Surgery tentatively scheduled for Wednesday afternoon secondary to being on Eliquis   Jariana Shumard R. 01/25/2018, 11:15 AM

## 2018-01-25 NOTE — NC FL2 (Signed)
[place] LEVEL OF CARE SCREENING TOOL     IDENTIFICATION  Patient Name: Sarah Dougherty Birthdate: March 29, 1922 Sex: female Admission Date (Current Location): 01/24/2018  [place] and [place] Number:  Engineering geologist and Address:  [facility], [address] [place], [place] [address]      Provider Number: [id number]  Attending Physician Name and Address:  Loletha Grayer, MD  Relative Name and Phone Number:       Current Level of Care: Hospital Recommended Level of Care: [place] Prior Approval Number:    Date Approved/Denied:   PASRR Number: [id number])  Discharge Plan: SNF    Current Diagnoses: Patient Active Problem List   Diagnosis Date Noted  . Hip fracture ([place]) 01/24/2018  . Chronic kidney disease, stage 3 ([place]) 01/13/2018  . GERD (gastroesophageal reflux disease) 01/13/2018  . Hyperlipidemia 01/13/2018  . Hypertension 01/13/2018  . Osteoarthritis of knees, bilateral 01/13/2018  . 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 Duodenal mass   . Abdominal pain   . Pancreatitis 01/05/2018  . S/P hip hemiarthroplasty 08/16/2017  . Age-related osteoporosis with current pathological fracture with routine healing 04/10/2017  . Chronic atrial fibrillation 04/10/2017  . Hip fx ([place]) 04/03/2017  . Malignant neoplasm of parotid gland ([place]) 12/10/2016  . COPD exacerbation ([place]) 04/07/2016  . Squamous cell carcinoma 03/19/2016  . Bradycardia 12/10/2014  . COPD (chronic obstructive pulmonary disease) ([place]) 12/09/2014  . Asthma 02/17/2014  . Congestive heart failure ([place]) 02/17/2014  . Chronic respiratory failure with hypoxia ([place]) 11/09/2013  . Benign essential hypertension 12/07/2006  . Type 2 diabetes mellitus ([place]) 12/07/2006    Orientation RESPIRATION BLADDER Height & Weight     Self, Time, Place, Situation  O2(2 Liters Oxygen. ) Continent Weight: 102 lb (46.3 kg) Height:  4\' 10"  (147.3 cm)   BEHAVIORAL SYMPTOMS/MOOD NEUROLOGICAL BOWEL NUTRITION STATUS      Continent Diet(Diet: Regular )  AMBULATORY STATUS COMMUNICATION OF NEEDS Skin   Extensive Assist Verbally Surgical wounds                       Personal Care Assistance Level of Assistance  Bathing, Feeding, Dressing Bathing Assistance: Limited assistance Feeding assistance: Independent Dressing Assistance: Limited assistance     Functional Limitations Info  Sight, Hearing, Speech Sight Info: Adequate Hearing Info: Impaired Speech Info: Adequate    SPECIAL CARE FACTORS FREQUENCY  PT (By licensed PT), OT (By licensed OT)     PT Frequency: (5) OT Frequency: (5)            Contractures      Additional Factors Info  Code Status, Allergies Code Status Info: (Full Code. ) Allergies Info: (Amoxicillin, Cephalexin, Nitrofurantoin, Penicillins, Sulfa Antibiotics, Atorvastatin, Azithromycin)           Current Medications (01/25/2018):  This is the current hospital active medication list Current Facility-Administered Medications  Medication Dose Route Frequency Provider Last Rate Last Dose  . acetaminophen (TYLENOL) tablet 650 mg  650 mg Oral Q6H PRN Loletha Grayer, MD       Or  . acetaminophen (TYLENOL) suppository 650 mg  650 mg Rectal Q6H PRN Wieting, Richard, MD      . albuterol (PROVENTIL) (2.5 MG/3ML) 0.083% nebulizer solution 2.5 mg  2.5 mg Nebulization Q6H PRN Maccia, Melissa D, RPH      . amiodarone (PACERONE) tablet 100 mg  100 mg Oral Daily Wieting, Richard, MD      . atorvastatin (LIPITOR)  tablet 20 mg  20 mg Oral q1800 Wieting, Richard, MD      . brimonidine (ALPHAGAN) 0.2 % ophthalmic solution 1 drop  1 drop Left Eye BID Wieting, Richard, MD      . Chlorhexidine Gluconate Cloth 2 % PADS 6 each  6 each Topical Daily Wieting, Richard, MD      . cholecalciferol (VITAMIN D) tablet 2,000 Units  2,000 Units Oral Daily Wieting, Richard, MD      . insulin aspart (novoLOG) injection 0-5 Units   0-5 Units Subcutaneous QHS Wieting, Richard, MD      . insulin aspart (novoLOG) injection 0-9 Units  0-9 Units Subcutaneous TID WC Loletha Grayer, MD   1 Units at 01/25/18 [phone]  . LORazepam (ATIVAN) tablet 0.25 mg  0.25 mg Oral BID PRN Loletha Grayer, MD   0.25 mg at 01/25/18 0022  . morphine 2 MG/ML injection 2 mg  2 mg Intravenous Q4H PRN Loletha Grayer, MD   2 mg at 01/25/18 0417  . mupirocin ointment (BACTROBAN) 2 % 1 application  1 application Nasal BID Loletha Grayer, MD   1 application at 32/67/12 0029  . ondansetron (ZOFRAN) tablet 4 mg  4 mg Oral Q6H PRN Loletha Grayer, MD       Or  . ondansetron [facility]) injection 4 mg  4 mg Intravenous Q6H PRN Wieting, Richard, MD      . oxyCODONE (Oxy IR/ROXICODONE) immediate release tablet 5 mg  5 mg Oral Q4H PRN Loletha Grayer, MD   5 mg at 01/25/18 4580  . pantoprazole (PROTONIX) EC tablet 40 mg  40 mg Oral Daily Loletha Grayer, MD      . Derrill Memo ON 01/26/2018] pneumococcal 23 valent vaccine (PNU-IMMUNE) injection 0.5 mL  0.5 mL Intramuscular Tomorrow-1000 Wieting, Richard, MD      . sodium chloride (OCEAN) 0.65 % nasal spray 2 spray  2 spray Nasal Daily PRN Wieting, Richard, MD      . Travoprost (BAK Free) (TRAVATAN) 0.004 % ophthalmic solution SOLN 1 drop  1 drop Left Eye QHS Loletha Grayer, MD         Discharge Medications: Please see discharge summary for a list of discharge medications.  Relevant Imaging Results:  Relevant Lab Results:   Additional Information (SSN: [id number])  Demyan Fugate, Veronia Beets, LCSW

## 2018-01-25 NOTE — Plan of Care (Signed)
  Problem: Education: Goal: Knowledge of General Education information will improve Description Including pain rating scale, medication(s)/side effects and non-pharmacologic comfort measures Outcome: Progressing   

## 2018-01-25 NOTE — Clinical Social Work Placement (Signed)
   CLINICAL SOCIAL WORK PLACEMENT  NOTE  Date:  01/25/2018  Patient Details  Name: Sarah Dougherty MRN: [id number] Date of Birth: Oct 20, 1921  Clinical Social Work is seeking post-discharge placement for this patient at the [place] level of care (*CSW will initial, date and re-position this form in  chart as items are completed):  Yes   Patient/family provided with [place] Work Department's list of facilities offering this level of care within the geographic area requested by the patient (or if unable, by the patient's family).  Yes   Patient/family informed of their freedom to choose among providers that offer the needed level of care, that participate in Medicare, Medicaid or managed care program needed by the patient, have an available bed and are willing to accept the patient.  Yes   Patient/family informed of [facility]'s ownership interest in [facility] and [facility], as well as of the fact that they are under no obligation to receive care at these facilities.  PASRR submitted to EDS on       PASRR number received on       Existing PASRR number confirmed on 01/25/18     FL2 transmitted to all facilities in geographic area requested by pt/family on 01/25/18     FL2 transmitted to all facilities within larger geographic area on       Patient informed that his/her managed care company has contracts with or will negotiate with certain facilities, including the following:            Patient/family informed of bed offers received.  Patient chooses bed at       Physician recommends and patient chooses bed at      Patient to be transferred to   on  .  Patient to be transferred to facility by       Patient family notified on   of transfer.  Name of family member notified:        PHYSICIAN       Additional Comment:    _______________________________________________ Hanna Ra, Veronia Beets, LCSW 01/25/2018, 10:14 AM

## 2018-01-25 NOTE — Patient Outreach (Signed)
[name]  01/25/2018  Sarah Dougherty 08-13-1921 [id number]  84year old femaleoutreached by [place] [facility] services for a 30 day post discharge medication review. PMHx includes, but not limited to, COPD, cancer of parotid gland, atrial fibrillation, type 2 diabetes mellitus, heart failure and hyperlipidemia.  Unsuccessful telephone call attempt # 3 to patient.   Left HIPAA voice message requesting a return call.  Plan:  I will follow-up on 10th business day from opening case.  If no response from patient at this time, I will close [facility] case.   Joetta Manners, PharmD Clinical Pharmacist [place] [phone]

## 2018-01-25 NOTE — Progress Notes (Signed)
Patient ID: Sarah Dougherty, female   DOB: 1921-10-12, 84 y.o.   MRN: [id number]  [employer] PROGRESS NOTE  Sarah Dougherty:[id number] DOB: 1921-11-27 DOA: 01/24/2018 PCP: Perrin Maltese, MD  HPI/Subjective: Patient feels okay.  No complaints of shortness of breath or chest pain.  Having a lot of pain in the hip.  Objective: Vitals:   01/25/18 0338 01/25/18 0412  BP: (!) 111/51   Pulse: 60   Resp: 19   Temp: 98.1 F (36.7 C)   SpO2: (!) 87% 94%    Filed Weights   01/24/18 0547 01/24/18 1100  Weight: 48 kg 46.3 kg    ROS: Review of Systems  Constitutional: Negative for chills and fever.  Eyes: Negative for blurred vision.  Respiratory: Negative for cough and shortness of breath.   Cardiovascular: Negative for chest pain.  Gastrointestinal: Negative for abdominal pain, constipation, diarrhea, nausea and vomiting.  Genitourinary: Negative for dysuria.  Musculoskeletal: Positive for joint pain.  Neurological: Negative for dizziness and headaches.   Exam: Physical Exam  HENT:  Nose: No mucosal edema.  Mouth/Throat: No oropharyngeal exudate or posterior oropharyngeal edema.  Eyes: Pupils are equal, round, and reactive to light. Conjunctivae, EOM and lids are normal.  Neck: No JVD present. Carotid bruit is not present. No edema present. No thyroid mass and no thyromegaly present.  Cardiovascular: S1 normal and S2 normal. Exam reveals no gallop.  No murmur heard. Pulses:      Dorsalis pedis pulses are 2+ on the right side, and 2+ on the left side.  Respiratory: No respiratory distress. She has no wheezes. She has no rhonchi. She has no rales.  GI: Soft. Bowel sounds are normal. There is no tenderness.  Musculoskeletal:       Right ankle: She exhibits no swelling.       Left ankle: She exhibits no swelling.  Left leg shortened and externally rotated  Lymphadenopathy:    She has no cervical adenopathy.  Neurological: She is alert. No cranial nerve deficit.  Skin:  Skin is warm. No rash noted. Nails show no clubbing.  Left ear hematoma  Psychiatric: She has a normal mood and affect.      Data Reviewed: Basic Metabolic Panel: Recent Labs  Lab 01/24/18 0602 01/25/18 0257  NA 141 143  K 3.8 3.9  CL 106 111  CO2 23 24  GLUCOSE 194* 168*  BUN 22 25*  CREATININE 0.79 0.76  CALCIUM 9.0 8.2*   Liver Function Tests: Recent Labs  Lab 01/24/18 0602  AST 18  ALT 13  ALKPHOS 68  BILITOT 1.4*  PROT 7.1  ALBUMIN 3.9   CBC: Recent Labs  Lab 01/24/18 0602 01/25/18 0257  WBC 9.5 9.7  NEUTROABS 6.9  --   HGB 11.7* 9.1*  HCT 38.0 30.5*  MCV 93.8 96.2  PLT 357 286    CBG: Recent Labs  Lab 01/24/18 1135 01/24/18 1657 01/24/18 2119  GLUCAP 166* 145* 154*    Recent Results (from the past 240 hour(s))  Surgical pcr screen     Status: Abnormal   Collection Time: 01/24/18  7:09 PM  Result Value Ref Range Status   MRSA, PCR NEGATIVE NEGATIVE Final   Staphylococcus aureus POSITIVE (A) NEGATIVE Final    Comment: (NOTE) The Xpert SA Assay (FDA approved for NASAL specimens in patients 21 years of age and older), is one component of a comprehensive surveillance program. It is not intended to diagnose infection nor to guide or monitor  treatment. Performed at [facility], [address] [place], [place] [address]      Studies: Ct Head Wo Contrast  Result Date: 01/24/2018 CLINICAL DATA:  Recent fall EXAM: CT HEAD WITHOUT CONTRAST CT CERVICAL SPINE WITHOUT CONTRAST TECHNIQUE: Multidetector CT imaging of the head and cervical spine was performed following the standard protocol without intravenous contrast. Multiplanar CT image reconstructions of the cervical spine were also generated. COMPARISON:  03/24/2017 FINDINGS: CT HEAD FINDINGS Brain: Mild atrophic changes and chronic white matter ischemic changes are seen. No findings to suggest acute hemorrhage, acute infarction or space-occupying mass lesion are noted. Vascular: No  hyperdense vessel or unexpected calcification. Skull: Burr holes are noted on the left stable in appearance from the prior exam. Sinuses/Orbits: Mild mucosal thickening is noted within the maxillary antra. No acute air-fluid level is identified. Right eye prosthesis is noted. Other: Thickening in the left ear is noted consistent with subcutaneous hematoma related to the recent injury CT CERVICAL SPINE FINDINGS Alignment: Within normal limits. Skull base and vertebrae: 7 cervical segments are well visualized. Vertebral body height is well maintained. Multilevel disc space narrowing and facet hypertrophic changes are seen. Diffuse osteophytic changes are noted as well. No acute fracture or acute facet abnormality is noted. Soft tissues and spinal canal: Mild thickening along the sternocleidomastoid on the left is noted related to the recent injury and some mild subcutaneous hemorrhage. No focal hematoma is seen. Upper chest: Patchy atelectatic changes are noted in the right upper lobe. Other: None IMPRESSION: CT of the head: No acute intracranial abnormality is noted. Chronic atrophic changes and white matter ischemic changes are again seen. The left ear is thickened consistent with some localized hematoma related to the recent injury. CT of the cervical spine: Multilevel degenerative changes are noted without acute bony abnormality. Mild subcutaneous hemorrhage is noted in the left neck along the sternocleidomastoid consistent with the recent injury. Electronically Signed   By: Inez Catalina M.D.   On: 01/24/2018 07:30   Ct Cervical Spine Wo Contrast  Result Date: 01/24/2018 CLINICAL DATA:  Recent fall EXAM: CT HEAD WITHOUT CONTRAST CT CERVICAL SPINE WITHOUT CONTRAST TECHNIQUE: Multidetector CT imaging of the head and cervical spine was performed following the standard protocol without intravenous contrast. Multiplanar CT image reconstructions of the cervical spine were also generated. COMPARISON:  03/24/2017  FINDINGS: CT HEAD FINDINGS Brain: Mild atrophic changes and chronic white matter ischemic changes are seen. No findings to suggest acute hemorrhage, acute infarction or space-occupying mass lesion are noted. Vascular: No hyperdense vessel or unexpected calcification. Skull: Burr holes are noted on the left stable in appearance from the prior exam. Sinuses/Orbits: Mild mucosal thickening is noted within the maxillary antra. No acute air-fluid level is identified. Right eye prosthesis is noted. Other: Thickening in the left ear is noted consistent with subcutaneous hematoma related to the recent injury CT CERVICAL SPINE FINDINGS Alignment: Within normal limits. Skull base and vertebrae: 7 cervical segments are well visualized. Vertebral body height is well maintained. Multilevel disc space narrowing and facet hypertrophic changes are seen. Diffuse osteophytic changes are noted as well. No acute fracture or acute facet abnormality is noted. Soft tissues and spinal canal: Mild thickening along the sternocleidomastoid on the left is noted related to the recent injury and some mild subcutaneous hemorrhage. No focal hematoma is seen. Upper chest: Patchy atelectatic changes are noted in the right upper lobe. Other: None IMPRESSION: CT of the head: No acute intracranial abnormality is noted. Chronic atrophic changes  and white matter ischemic changes are again seen. The left ear is thickened consistent with some localized hematoma related to the recent injury. CT of the cervical spine: Multilevel degenerative changes are noted without acute bony abnormality. Mild subcutaneous hemorrhage is noted in the left neck along the sternocleidomastoid consistent with the recent injury. Electronically Signed   By: Inez Catalina M.D.   On: 01/24/2018 07:30   Ct Hip Left Wo Contrast  Result Date: 01/24/2018 CLINICAL DATA:  Left hip fracture EXAM: CT OF THE LEFT HIP WITHOUT CONTRAST TECHNIQUE: Multidetector CT imaging of the left hip  was performed according to the standard protocol. Multiplanar CT image reconstructions were also generated. COMPARISON:  01/24/2018 FINDINGS: Fracture of the inferior femoral neck just above the trochanter with approximately 90 degrees of angulation. Fracture is comminuted and impacted. Normal femoral head. Normal hip joint. Negative for joint effusion. Mild edema/hematoma in the surrounding tissues. No pelvic hematoma. Acetabulum is normal. No other pelvic fractures on the left. IMPRESSION: Fracture of the inferior femoral neck on the left with 90 degrees of angulation and fracture impaction. Normal joint space. No other fracture in the left pelvis. Electronically Signed   By: Franchot Gallo M.D.   On: 01/24/2018 07:55   Dg Chest Portable 1 View  Result Date: 01/24/2018 CLINICAL DATA:  Preoperative. Hip fracture. EXAM: PORTABLE CHEST 1 VIEW COMPARISON:  04/05/2017 FINDINGS: Shallow inspiration with elevation of the right hemidiaphragm. Cardiac pacemaker. Heart size and pulmonary vascularity are normal for technique. No airspace disease or consolidation in the lungs. No blunting of costophrenic angles. No pneumothorax. Mediastinal contours appear intact. Calcification of the aorta. Old left rib fractures. IMPRESSION: No active disease. Electronically Signed   By: Lucienne Capers M.D.   On: 01/24/2018 06:49   Dg Hip Unilat With Pelvis 2-3 Views Left  Result Date: 01/24/2018 CLINICAL DATA:  Fall. EXAM: DG HIP (WITH OR WITHOUT PELVIS) 2-3V LEFT COMPARISON:  None. FINDINGS: Acute inter trochanteric comminuted fracture of the proximal left femur with varus angulation of the fracture fragments. Diffuse bone demineralization. Degenerative changes in the left hip and lower lumbar spine. Pelvis appears intact. SI joints and symphysis pubis are not displaced. Old right hip hemiarthroplasty. Vascular calcifications. IMPRESSION: Acute posttraumatic comminuted intertrochanteric fracture of the proximal left femur with  varus angulation. Electronically Signed   By: Lucienne Capers M.D.   On: 01/24/2018 06:48    Scheduled Meds: . amiodarone  100 mg Oral Daily  . atorvastatin  20 mg Oral q1800  . brimonidine  1 drop Left Eye BID  . Chlorhexidine Gluconate Cloth  6 each Topical Daily  . cholecalciferol  2,000 Units Oral Daily  . insulin aspart  0-5 Units Subcutaneous QHS  . insulin aspart  0-9 Units Subcutaneous TID WC  . mupirocin ointment  1 application Nasal BID  . pantoprazole  40 mg Oral Daily  . [START ON 01/26/2018] pneumococcal 23 valent vaccine  0.5 mL Intramuscular Tomorrow-1000  . Travoprost (BAK Free)  1 drop Left Eye QHS   Continuous Infusions:  Assessment/Plan:  1. Left hip fracture requiring operative repair.  Looks like orthopedic surgery plans on doing this tomorrow.  Discontinue IV fluids.  Continue to hold Eliquis.  No contraindications to surgery on a weightbearing bone. 2. History of orthostatic hypotension.  Discontinue Norvasc since blood pressure on the lower side. 3. Chronic atrial fibrillation.  Continue to hold Eliquis.  Stroke risk higher off the Eliquis.  Continue amiodarone low-dose 4. Type 2 diabetes mellitus put  on sliding scale and hold glimepiride at this point. 5. Hyperlipidemia unspecified on Lipitor 6. GERD on PPI 7. Glaucoma continue eyedrops 8. Left ear hematoma.  Hold Eliquis for right now.  Continue to watch mental status closely. 9. Pulse ox on room air dipped down into the high 80s.  Oxygen as needed.  Could be related to pain medications.  Lungs are clear.  I will discontinue IV fluids today.  Code Status:     Code Status Orders  (From admission, onward)         Start     Ordered   01/24/18 0758  Full code  Continuous     01/24/18 0758        Code Status History    Date Active Date Inactive Code Status Order ID Comments User Context   01/05/2018 1537 01/08/2018 1714 Full Code [id number]  Loletha Grayer, MD ED   04/03/2017 2205 04/09/2017 2050  Full Code [id number]  Salary, Avel Peace, MD Inpatient   12/10/2016 1327 12/12/2016 1517 Full Code [id number]  Clyde Canterbury, MD Inpatient   04/07/2016 2108 04/10/2016 1706 Full Code [id number]  Vaughan Basta, MD Inpatient   12/12/2014 1452 12/13/2014 1828 Full Code [id number]  Isaias Cowman, MD Inpatient   12/09/2014 2303 12/12/2014 1452 Full Code [id number]  Baxter Hire, MD Inpatient    Advance Directive Documentation     Most Recent Value  Type of Advance Directive  Healthcare Power of Attorney, Living will  Pre-existing out of facility DNR order (yellow form or pink MOST form)  -  "MOST" Form in Place?  -     Family Communication: Son at the bedside Disposition Plan: Patient's son would like to take the patient home.  This will likely be 2 to 3 days postoperatively which will bring [place] into Thursday afternoon or Friday morning.  Consultants: Orthopedic surgery  Time spent: 27 minutes  [place]

## 2018-01-26 LAB — GLUCOSE, CAPILLARY
GLUCOSE-CAPILLARY: 164 mg/dL — AB (ref 70–99)
Glucose-Capillary: 163 mg/dL — ABNORMAL HIGH (ref 70–99)
Glucose-Capillary: 158 mg/dL — ABNORMAL HIGH (ref 70–99)
Glucose-Capillary: 128 mg/dL — ABNORMAL HIGH (ref 70–99)

## 2018-01-26 MED ORDER — MORPHINE SULFATE (PF) 2 MG/ML IV SOLN
1.0000 mg | INTRAVENOUS | Status: DC | PRN
  Administered 2018-01-26 – 2018-01-27 (×4): 1 mg via INTRAVENOUS
  Filled 2018-01-26 (×5): qty 1

## 2018-01-26 MED ORDER — OXYCODONE HCL 5 MG PO TABS
2.5000 mg | ORAL_TABLET | ORAL | Status: DC | PRN
  Administered 2018-01-26: 2.5 mg via ORAL
  Filled 2018-01-26 (×2): qty 1

## 2018-01-26 MED ORDER — CLINDAMYCIN PHOSPHATE 600 MG/50ML IV SOLN
600.0000 mg | INTRAVENOUS | Status: DC
  Filled 2018-01-26 (×2): qty 50

## 2018-01-26 NOTE — Progress Notes (Addendum)
Patient ID: Sarah Dougherty, female   DOB: Dec 24, 1921, 84 y.o.   MRN: [id number]  [facility] PROGRESS NOTE  DORSIE SETHI QIH:[id number] DOB: 08-12-1921 DOA: 01/24/2018 PCP: Perrin Maltese, MD  HPI/Subjective: Patient was being adjusted in the bed and was having some pain in the left hip.  No complaints of chest pain or shortness of breath.  Son was saying that the patient was talking out of her head with the pain medications.  Objective: Vitals:   01/26/18 0001 01/26/18 0739  BP: (!) 99/54 140/76  Pulse: (!) 59 60  Resp: 19 16  Temp: 99.2 F (37.3 C) 98.6 F (37 C)  SpO2: 97% 96%    Filed Weights   01/24/18 0547 01/24/18 1100  Weight: 48 kg 46.3 kg    ROS: Review of Systems  Constitutional: Negative for chills and fever.  Eyes: Negative for blurred vision.  Respiratory: Negative for cough and shortness of breath.   Cardiovascular: Negative for chest pain.  Gastrointestinal: Negative for abdominal pain, nausea and vomiting.  Genitourinary: Negative for dysuria.  Musculoskeletal: Positive for joint pain.  Neurological: Negative for dizziness and headaches.   Exam: Physical Exam  HENT:  Nose: No mucosal edema.  Mouth/Throat: No oropharyngeal exudate or posterior oropharyngeal edema.  Eyes: Pupils are equal, round, and reactive to light. Conjunctivae, EOM and lids are normal.  Neck: No JVD present. Carotid bruit is not present. No edema present. No thyroid mass and no thyromegaly present.  Cardiovascular: S1 normal and S2 normal. Exam reveals no gallop.  No murmur heard. Pulses:      Dorsalis pedis pulses are 2+ on the right side, and 2+ on the left side.  Respiratory: No respiratory distress. She has no wheezes. She has no rhonchi. She has no rales.  GI: Soft. Bowel sounds are normal. There is no tenderness.  Musculoskeletal:       Right ankle: She exhibits no swelling.       Left ankle: She exhibits no swelling.  Left leg shortened and externally rotated   Lymphadenopathy:    She has no cervical adenopathy.  Neurological: She is alert. No cranial nerve deficit.  Skin: Skin is warm. No rash noted. Nails show no clubbing.  Left ear hematoma  Psychiatric: She has a normal mood and affect.      Data Reviewed: Basic Metabolic Panel: Recent Labs  Lab 01/24/18 0602 01/25/18 0257  NA 141 143  K 3.8 3.9  CL 106 111  CO2 23 24  GLUCOSE 194* 168*  BUN 22 25*  CREATININE 0.79 0.76  CALCIUM 9.0 8.2*   Liver Function Tests: Recent Labs  Lab 01/24/18 0602  AST 18  ALT 13  ALKPHOS 68  BILITOT 1.4*  PROT 7.1  ALBUMIN 3.9   CBC: Recent Labs  Lab 01/24/18 0602 01/25/18 0257  WBC 9.5 9.7  NEUTROABS 6.9  --   HGB 11.7* 9.1*  HCT 38.0 30.5*  MCV 93.8 96.2  PLT 357 286    CBG: Recent Labs  Lab 01/24/18 2119 01/25/18 0742 01/25/18 1212 01/25/18 1709 01/25/18 2108  GLUCAP 154* 148* 155* 150* 151*    Recent Results (from the past 240 hour(s))  Urine culture     Status: None   Collection Time: 01/24/18  6:02 AM  Result Value Ref Range Status   Specimen Description   Final    URINE, CATHETERIZED Performed at [facility], [address] [place], [place] [address]    Special Requests  Final    NONE Performed at [facility], [address] [place], [place] [address]    Culture   Final    NO GROWTH Performed at [facility], [place] [address], [place], [place] [address]    Report Status 01/25/2018 FINAL  Final  Surgical pcr screen     Status: Abnormal   Collection Time: 01/24/18  7:09 PM  Result Value Ref Range Status   MRSA, PCR NEGATIVE NEGATIVE Final   Staphylococcus aureus POSITIVE (A) NEGATIVE Final    Comment: (NOTE) The Xpert SA Assay (FDA approved for NASAL specimens in patients 48 years of age and older), is one component of a comprehensive surveillance program. It is not intended to diagnose infection nor to guide or monitor treatment. Performed at [facility],  [address], [place], [place] [address]      Studies: No results found.  Scheduled Meds: . amiodarone  100 mg Oral Daily  . atorvastatin  20 mg Oral q1800  . brimonidine  1 drop Left Eye BID  . Chlorhexidine Gluconate Cloth  6 each Topical Daily  . cholecalciferol  2,000 Units Oral Daily  . insulin aspart  0-5 Units Subcutaneous QHS  . insulin aspart  0-9 Units Subcutaneous TID WC  . mupirocin ointment  1 application Nasal BID  . pantoprazole  40 mg Oral Daily  . pneumococcal 23 valent vaccine  0.5 mL Intramuscular Tomorrow-1000  . Travoprost (BAK Free)  1 drop Left Eye QHS   Continuous Infusions:  Assessment/Plan:  1. Left hip fracture requiring operative repair.  Anesthesia wants to do a spinal for anesthesia for the procedure and wants to wait an extra day.  Surgery will be put off till tomorrow.  Continue to hold Eliquis.  No contraindications to surgery on a weightbearing bone. 2. History of orthostatic hypotension.  Continue to hold Norvasc 3. Chronic atrial fibrillation.  Continue to hold Eliquis.  Stroke risk higher off the Eliquis.  Continue amiodarone low-dose. 4. Type 2 diabetes mellitus.  Continue sliding scale and hold glimepiride at this point. 5. Hyperlipidemia unspecified on Lipitor 6. GERD on PPI 7. Glaucoma continue eyedrops 8. Left ear hematoma.  Hold Eliquis for right now. 9. Pulse ox dipped down into the high 80s and oxygen was placed.  Pulse ox is fine on 2 L of oxygen. 10. Drop in hemoglobin.  Anemia.  I think her hemoglobin does vary based on her volume status.  Check hemoglobin tomorrow morning.  Continue to hold blood thinner.    Code Status:     Code Status Orders  (From admission, onward)         Start     Ordered   01/24/18 0758  Full code  Continuous     01/24/18 0758        Code Status History    Date Active Date Inactive Code Status Order ID Comments User Context   01/05/2018 1537 01/08/2018 1714 Full Code [id number]  Loletha Grayer, MD ED   04/03/2017 2205 04/09/2017 2050 Full Code [id number]  Salary, Avel Peace, MD Inpatient   12/10/2016 1327 12/12/2016 1517 Full Code [id number]  Clyde Canterbury, MD Inpatient   04/07/2016 2108 04/10/2016 1706 Full Code [id number]  Vaughan Basta, MD Inpatient   12/12/2014 1452 12/13/2014 1828 Full Code [id number]  Isaias Cowman, MD Inpatient   12/09/2014 2303 12/12/2014 1452 Full Code [id number]  Baxter Hire, MD Inpatient    Advance Directive Documentation     Most  Recent Value  Type of Advance Directive  Healthcare Power of Attorney, Living will  Pre-existing out of facility DNR order (yellow form or pink MOST form)  -  "MOST" Form in Place?  -     Family Communication: Son at the bedside Disposition Plan: Patient's son would like to take the patient home.  This will likely be 2 to 3 days postoperatively which will bring [place] into Friday afternoon versus Saturday morning  Consultants: Orthopedic surgery  Time spent: 28 minutes, case discussed with orthopedic surgery  [place]

## 2018-01-26 NOTE — Progress Notes (Signed)
Clinical Education officer, museum (CSW) met with patient's sons [name] and [place] at bedside. Per [name] he is going to take patient home after surgery and requested [facility] for home health. Per [name] he does not want patient to go to a SNF and had a bad experience at [facility]. CSW explained that another facility can be looked into and he stated he will take patient home and decline SNF. Per son he has all the equipment needed at home. Son thanked CSW for visit. CSW will continue to follow and assist as needed.   [employer], LCSW [phone]

## 2018-01-26 NOTE — Progress Notes (Signed)
Subjective :Patient is day 3 admission for left hip fracture Patient sleeping very soundly this morning.  Was not able to arouse.  Son also in the room in the recliner sleeping very sound. Does not appear to have any real changes from yesterday.  Objective: Vital signs in last 24 hours: Temp:  [98.1 F (36.7 C)-99.2 F (37.3 C)] 98.6 F (37 C) (10/15 0739) Pulse Rate:  [59-60] 60 (10/15 0739) Resp:  [16-20] 16 (10/15 0739) BP: (99-140)/(51-76) 140/76 (10/15 0739) SpO2:  [96 %-97 %] 96 % (10/15 0739) Buck's traction in place to left leg  Intake/Output from previous day: 10/14 0701 - 10/15 0700 In: 240 [P.O.:240] Out: 400 [Urine:400] Intake/Output this shift: No intake/output data recorded.  Recent Labs    01/24/18 0602 01/25/18 0257  HGB 11.7* 9.1*   Recent Labs    01/24/18 0602 01/25/18 0257  WBC 9.5 9.7  RBC 4.05 3.17*  HCT 38.0 30.5*  PLT 357 286   Recent Labs    01/24/18 0602 01/25/18 0257  NA 141 143  K 3.8 3.9  CL 106 111  CO2 23 24  BUN 22 25*  CREATININE 0.79 0.76  GLUCOSE 194* 168*  CALCIUM 9.0 8.2*   Recent Labs    01/24/18 0602  INR 1.08    Neurologically intact Neurovascular intact Intact pulses distally Compartment soft  Assessment/Plan: Left femoral neck fracture Case management to assist with discharge planning Surgery planned for tomorrow afternoon.  Delayed secondary to patient being on Eliquis and anesthesia would like to do a spinal because of pulmonary issues. Bowel movement today Labs in am Plan to discharge 2 days postop Will need to follow-up in [facility] 2 weeks postop for staple removal   WOLFE,JON R. 01/26/2018, 7:57 AM

## 2018-01-27 ENCOUNTER — Inpatient Hospital Stay: Payer: PPO | Admitting: Anesthesiology

## 2018-01-27 ENCOUNTER — Encounter: Payer: Self-pay | Admitting: Anesthesiology

## 2018-01-27 ENCOUNTER — Encounter
Admission: EM | Disposition: A | Discharge status: Skilled Nursing Facility | Payer: Self-pay | Source: Home / Self Care | Attending: Internal Medicine

## 2018-01-27 ENCOUNTER — Inpatient Hospital Stay: Payer: PPO

## 2018-01-27 HISTORY — PX: INTRAMEDULLARY (IM) NAIL INTERTROCHANTERIC: SHX5875

## 2018-01-27 HISTORY — PX: FRACTURE SURGERY: SHX138

## 2018-01-27 LAB — GLUCOSE, CAPILLARY
GLUCOSE-CAPILLARY: 162 mg/dL — AB (ref 70–99)
GLUCOSE-CAPILLARY: 148 mg/dL — AB (ref 70–99)
GLUCOSE-CAPILLARY: 106 mg/dL — AB (ref 70–99)
Glucose-Capillary: 127 mg/dL — ABNORMAL HIGH (ref 70–99)
Glucose-Capillary: 126 mg/dL — ABNORMAL HIGH (ref 70–99)

## 2018-01-27 LAB — HEMOGLOBIN: Hemoglobin: 9.4 g/dL — ABNORMAL LOW (ref 12.0–15.0)

## 2018-01-27 SURGERY — FIXATION, FRACTURE, INTERTROCHANTERIC, WITH INTRAMEDULLARY ROD
Anesthesia: General | Site: Hip | Laterality: Left

## 2018-01-27 MED ORDER — SODIUM CHLORIDE 0.9 % IV SOLN
INTRAVENOUS | Status: DC
  Administered 2018-01-27: 23:00:00 via INTRAVENOUS

## 2018-01-27 MED ORDER — PROPOFOL 500 MG/50ML IV EMUL
INTRAVENOUS | Status: AC
  Filled 2018-01-27: qty 50

## 2018-01-27 MED ORDER — ACETAMINOPHEN 10 MG/ML IV SOLN
INTRAVENOUS | Status: DC | PRN
  Administered 2018-01-27: 1000 mg via INTRAVENOUS

## 2018-01-27 MED ORDER — MENTHOL 3 MG MT LOZG
1.0000 | LOZENGE | OROMUCOSAL | Status: DC | PRN
  Filled 2018-01-27: qty 9

## 2018-01-27 MED ORDER — ACETAMINOPHEN 10 MG/ML IV SOLN
1000.0000 mg | Freq: Four times a day (QID) | INTRAVENOUS | Status: AC
  Administered 2018-01-28 (×2): 1000 mg via INTRAVENOUS
  Filled 2018-01-27 (×5): qty 100

## 2018-01-27 MED ORDER — ONDANSETRON HCL 4 MG PO TABS: 4.0000 mg | ORAL_TABLET | Freq: Four times a day (QID) | ORAL | Status: DC | PRN

## 2018-01-27 MED ORDER — OXYCODONE HCL 5 MG PO TABS
10.0000 mg | ORAL_TABLET | ORAL | Status: DC | PRN
  Administered 2018-01-29: 10 mg via ORAL
  Filled 2018-01-27: qty 2

## 2018-01-27 MED ORDER — SODIUM CHLORIDE 0.9 % IV SOLN
INTRAVENOUS | Status: DC | PRN
  Administered 2018-01-27: 15 ug/min via INTRAVENOUS

## 2018-01-27 MED ORDER — CLINDAMYCIN PHOSPHATE 600 MG/50ML IV SOLN
600.0000 mg | Freq: Four times a day (QID) | INTRAVENOUS | Status: AC
  Administered 2018-01-28 (×2): 600 mg via INTRAVENOUS
  Filled 2018-01-27 (×2): qty 50

## 2018-01-27 MED ORDER — HYDROMORPHONE HCL 1 MG/ML IJ SOLN: 0.5000 mg | INTRAMUSCULAR | Status: DC | PRN

## 2018-01-27 MED ORDER — PROPOFOL 10 MG/ML IV BOLUS
INTRAVENOUS | Status: DC | PRN
  Administered 2018-01-27: 20 mg via INTRAVENOUS

## 2018-01-27 MED ORDER — SENNOSIDES-DOCUSATE SODIUM 8.6-50 MG PO TABS
1.0000 | ORAL_TABLET | Freq: Two times a day (BID) | ORAL | Status: DC
  Administered 2018-01-27 – 2018-01-29 (×4): 1 via ORAL
  Filled 2018-01-27 (×4): qty 1

## 2018-01-27 MED ORDER — NEOMYCIN-POLYMYXIN B GU 40-200000 IR SOLN
Status: DC | PRN
  Administered 2018-01-27: 4 mL

## 2018-01-27 MED ORDER — BISACODYL 10 MG RE SUPP
10.0000 mg | Freq: Every day | RECTAL | Status: DC | PRN
  Administered 2018-01-29: 10 mg via RECTAL
  Filled 2018-01-27: qty 1

## 2018-01-27 MED ORDER — PROPOFOL 500 MG/50ML IV EMUL
INTRAVENOUS | Status: DC | PRN
  Administered 2018-01-27: 30 ug/kg/min via INTRAVENOUS

## 2018-01-27 MED ORDER — ACETAMINOPHEN 325 MG PO TABS: 325.0000 mg | ORAL_TABLET | Freq: Four times a day (QID) | ORAL | Status: DC | PRN

## 2018-01-27 MED ORDER — SODIUM CHLORIDE 0.9 % IV SOLN
INTRAVENOUS | Status: DC
  Administered 2018-01-27: 10:00:00 via INTRAVENOUS

## 2018-01-27 MED ORDER — ACETAMINOPHEN 10 MG/ML IV SOLN
INTRAVENOUS | Status: AC
  Filled 2018-01-27: qty 100

## 2018-01-27 MED ORDER — FLEET ENEMA 7-19 GM/118ML RE ENEM: 1.0000 | ENEMA | Freq: Once | RECTAL | Status: DC | PRN

## 2018-01-27 MED ORDER — ONDANSETRON HCL 4 MG/2ML IJ SOLN: 4.0000 mg | Freq: Once | INTRAMUSCULAR | Status: DC | PRN

## 2018-01-27 MED ORDER — PHENOL 1.4 % MT LIQD
1.0000 | OROMUCOSAL | Status: DC | PRN
  Filled 2018-01-27: qty 177

## 2018-01-27 MED ORDER — MAGNESIUM HYDROXIDE 400 MG/5ML PO SUSP
30.0000 mL | Freq: Every day | ORAL | Status: DC | PRN
  Administered 2018-01-28: 30 mL via ORAL
  Filled 2018-01-27: qty 30

## 2018-01-27 MED ORDER — METOCLOPRAMIDE HCL 5 MG/ML IJ SOLN: 5.0000 mg | Freq: Three times a day (TID) | INTRAMUSCULAR | Status: DC | PRN

## 2018-01-27 MED ORDER — METOCLOPRAMIDE HCL 10 MG PO TABS: 5.0000 mg | ORAL_TABLET | Freq: Three times a day (TID) | ORAL | Status: DC | PRN

## 2018-01-27 MED ORDER — ONDANSETRON HCL 4 MG/2ML IJ SOLN: 4.0000 mg | Freq: Four times a day (QID) | INTRAMUSCULAR | Status: DC | PRN

## 2018-01-27 MED ORDER — FENTANYL CITRATE (PF) 100 MCG/2ML IJ SOLN: 25.0000 ug | INTRAMUSCULAR | Status: DC | PRN

## 2018-01-27 MED ORDER — ENOXAPARIN SODIUM 40 MG/0.4ML ~~LOC~~ SOLN
40.0000 mg | SUBCUTANEOUS | Status: DC
  Administered 2018-01-28: 40 mg via SUBCUTANEOUS
  Filled 2018-01-27: qty 0.4

## 2018-01-27 MED ORDER — KETAMINE HCL 50 MG/ML IJ SOLN
INTRAMUSCULAR | Status: AC
  Filled 2018-01-27: qty 10

## 2018-01-27 MED ORDER — PROPOFOL 10 MG/ML IV BOLUS
INTRAVENOUS | Status: AC
  Filled 2018-01-27: qty 20

## 2018-01-27 MED ORDER — OXYCODONE HCL 5 MG PO TABS
2.5000 mg | ORAL_TABLET | ORAL | Status: DC | PRN
  Administered 2018-01-28 – 2018-01-29 (×2): 2.5 mg via ORAL
  Filled 2018-01-27 (×3): qty 1

## 2018-01-27 MED ORDER — CLINDAMYCIN PHOSPHATE 900 MG/50ML IV SOLN
INTRAVENOUS | Status: DC | PRN
  Administered 2018-01-27: 900 mg via INTRAVENOUS

## 2018-01-27 MED ORDER — KETAMINE HCL 50 MG/ML IJ SOLN
INTRAMUSCULAR | Status: DC | PRN
  Administered 2018-01-27: 20 mg via INTRAMUSCULAR

## 2018-01-27 SURGICAL SUPPLY — 38 items
BIT DRILL FLUTED FEMUR 4.2/3 (BIT) ×3 IMPLANT
BLADE 85 HELICAL TFNA (Anchor) ×3 IMPLANT
BNDG COHESIVE 6X5 TAN STRL LF (GAUZE/BANDAGES/DRESSINGS) ×3 IMPLANT
CANISTER SUCT 1200ML W/VALVE (MISCELLANEOUS) ×3 IMPLANT
COVER WAND RF STERILE (DRAPES) IMPLANT
DRAPE C-ARMOR (DRAPES) IMPLANT
DRAPE SHEET LG 3/4 BI-LAMINATE (DRAPES) ×3 IMPLANT
DRSG DERMACEA 8X12 NADH (GAUZE/BANDAGES/DRESSINGS) ×3 IMPLANT
DRSG OPSITE POSTOP 3X4 (GAUZE/BANDAGES/DRESSINGS) ×3 IMPLANT
DRSG OPSITE POSTOP 4X12 (GAUZE/BANDAGES/DRESSINGS) ×3 IMPLANT
DURAPREP 26ML APPLICATOR (WOUND CARE) ×3 IMPLANT
ELECT REM PT RETURN 9FT ADLT (ELECTROSURGICAL) ×3
ELECTRODE REM PT RTRN 9FT ADLT (ELECTROSURGICAL) ×1 IMPLANT
GAUZE SPONGE 4X4 12PLY STRL (GAUZE/BANDAGES/DRESSINGS) ×3 IMPLANT
GLOVE BIOGEL M STRL SZ7.5 (GLOVE) ×6 IMPLANT
GLOVE INDICATOR 8.0 STRL GRN (GLOVE) IMPLANT
GOWN STRL REUS W/ TWL LRG LVL3 (GOWN DISPOSABLE) ×2 IMPLANT
GOWN STRL REUS W/TWL LRG LVL3 (GOWN DISPOSABLE) ×4
GUIDEWIRE 3.2X400 (WIRE) ×3 IMPLANT
IMPL DEG TI CANN 11MM/130 (Orthopedic Implant) ×1 IMPLANT
IMPLANT DEG TI CANN 11MM/130 (Orthopedic Implant) ×3 IMPLANT
KIT TURNOVER CYSTO (KITS) ×3 IMPLANT
MAT ABSORB  FLUID 56X50 GRAY (MISCELLANEOUS) ×2
MAT ABSORB FLUID 56X50 GRAY (MISCELLANEOUS) ×1 IMPLANT
NS IRRIG 1000ML POUR BTL (IV SOLUTION) ×3 IMPLANT
PACK HIP COMPR (MISCELLANEOUS) ×3 IMPLANT
REAMER ROD DEEP FLUTE 2.5X950 (INSTRUMENTS) ×3 IMPLANT
SCREW LOCKING 5.0X32MM (Screw) ×3 IMPLANT
SOL PREP PVP 2OZ (MISCELLANEOUS) ×3
SOLUTION PREP PVP 2OZ (MISCELLANEOUS) ×1 IMPLANT
STAPLER SKIN PROX 35W (STAPLE) ×3 IMPLANT
SUCTION FRAZIER HANDLE 10FR (MISCELLANEOUS) ×2
SUCTION TUBE FRAZIER 10FR DISP (MISCELLANEOUS) ×1 IMPLANT
SUT VIC AB 0 CT1 36 (SUTURE) ×3 IMPLANT
SUT VIC AB 1 CT1 36 (SUTURE) ×3 IMPLANT
SUT VIC AB 2-0 CT1 27 (SUTURE) ×2
SUT VIC AB 2-0 CT1 TAPERPNT 27 (SUTURE) ×1 IMPLANT
TAPE MICROFOAM 4IN (TAPE) ×3 IMPLANT

## 2018-01-27 NOTE — Care Management Important Message (Signed)
Copy of signed IM left with patient in room.  

## 2018-01-27 NOTE — Progress Notes (Signed)
Pharm  Notified  for scheduled medications. See mar . Awaiting meds

## 2018-01-27 NOTE — Anesthesia Post-op Follow-up Note (Signed)
Anesthesia QCDR form completed.        

## 2018-01-27 NOTE — Transfer of Care (Signed)
Immediate Anesthesia Transfer of Care Note  Patient: Sarah Dougherty  Procedure(s) Performed: INTRAMEDULLARY (IM) NAIL INTERTROCHANTRIC ( TFNA ) (Left Hip)  Patient Location: PACU  Anesthesia Type:Spinal  Level of Consciousness: confused  Airway & Oxygen Therapy: Patient Spontanous Breathing  Post-op Assessment: Report given to RN and Post -op Vital signs reviewed and stable  Post vital signs: Reviewed and stable  Last Vitals:  Vitals Value Taken Time  BP 111/61 01/27/2018  9:44 PM  Temp    Pulse 60 01/27/2018  9:46 PM  Resp 12 01/27/2018  9:46 PM  SpO2 95 % 01/27/2018  9:46 PM  Vitals shown include unvalidated device data.  Last Pain:  Vitals:   01/27/18 1647  TempSrc: Oral  PainSc:       Patients Stated Pain Goal: 1 (94/32/00 3794)  Complications: No apparent anesthesia complications

## 2018-01-27 NOTE — Progress Notes (Signed)
Patient ID: Sarah Dougherty, female   DOB: Feb 29, 1922, 84 y.o.   MRN: [id number]  [facility] PROGRESS NOTE  Sarah Dougherty PPI:[id number] DOB: 01-14-1922 DOA: 01/24/2018 PCP: Perrin Maltese, MD  HPI/Subjective: Patient seen earlier.  Feeling okay.  Offers no complaints.  Objective: Vitals:   01/27/18 0806 01/27/18 1647  BP: (!) 136/59 110/61  Pulse: 62 61  Resp: 18 20  Temp: 98.1 F (36.7 C) 98.4 F (36.9 C)  SpO2: 99% 99%    Filed Weights   01/24/18 0547 01/24/18 1100  Weight: 48 kg 46.3 kg    ROS: Review of Systems  Constitutional: Negative for fever.  HENT: Positive for hearing loss.   Respiratory: Negative for cough and shortness of breath.   Cardiovascular: Negative for chest pain.  Gastrointestinal: Negative for abdominal pain.  Musculoskeletal: Positive for joint pain.  Neurological: Negative for dizziness.   Exam: Physical Exam  HENT:  Nose: No mucosal edema.  Mouth/Throat: No oropharyngeal exudate or posterior oropharyngeal edema.  Eyes: Pupils are equal, round, and reactive to light. Conjunctivae, EOM and lids are normal.  Neck: No JVD present. Carotid bruit is not present. No edema present. No thyroid mass and no thyromegaly present.  Cardiovascular: S1 normal and S2 normal. Exam reveals no gallop.  No murmur heard. Pulses:      Dorsalis pedis pulses are 2+ on the right side, and 2+ on the left side.  Respiratory: No respiratory distress. She has no wheezes. She has no rhonchi. She has no rales.  GI: Soft. Bowel sounds are normal. There is no tenderness.  Musculoskeletal:       Right ankle: She exhibits no swelling.       Left ankle: She exhibits no swelling.  Left leg shortened and externally rotated.  Lymphadenopathy:    She has no cervical adenopathy.  Neurological: She is alert. No cranial nerve deficit.  Skin: Skin is warm. No rash noted. Nails show no clubbing.  Psychiatric: She has a normal mood and affect.      Data  Reviewed: Basic Metabolic Panel: Recent Labs  Lab 01/24/18 0602 01/25/18 0257  NA 141 143  K 3.8 3.9  CL 106 111  CO2 23 24  GLUCOSE 194* 168*  BUN 22 25*  CREATININE 0.79 0.76  CALCIUM 9.0 8.2*   Liver Function Tests: Recent Labs  Lab 01/24/18 0602  AST 18  ALT 13  ALKPHOS 68  BILITOT 1.4*  PROT 7.1  ALBUMIN 3.9   CBC: Recent Labs  Lab 01/24/18 0602 01/25/18 0257 01/27/18 0426  WBC 9.5 9.7  --   NEUTROABS 6.9  --   --   HGB 11.7* 9.1* 9.4*  HCT 38.0 30.5*  --   MCV 93.8 96.2  --   PLT 357 286  --     CBG: Recent Labs  Lab 01/26/18 2116 01/27/18 0809 01/27/18 0919 01/27/18 1200 01/27/18 1649  GLUCAP 164* 162* 148* 127* 126*    Recent Results (from the past 240 hour(s))  Urine culture     Status: None   Collection Time: 01/24/18  6:02 AM  Result Value Ref Range Status   Specimen Description   Final    URINE, CATHETERIZED Performed at [facility], [address], [place], [place] [address]    Special Requests   Final    NONE Performed at [facility], [address] [place],  [address]    Culture   Final    NO GROWTH Performed  at [facility], [place] [address], [place], [place] [address]    Report Status 01/25/2018 FINAL  Final  Surgical pcr screen     Status: Abnormal   Collection Time: 01/24/18  7:09 PM  Result Value Ref Range Status   MRSA, PCR NEGATIVE NEGATIVE Final   Staphylococcus aureus POSITIVE (A) NEGATIVE Final    Comment: (NOTE) The Xpert SA Assay (FDA approved for NASAL specimens in patients 27 years of age and older), is one component of a comprehensive surveillance program. It is not intended to diagnose infection nor to guide or monitor treatment. Performed at [facility], [address] [place], [place] [address]      Scheduled Meds: . amiodarone  100 mg Oral Daily  . atorvastatin  20 mg Oral q1800  . brimonidine  1 drop Left Eye BID  . Chlorhexidine Gluconate Cloth  6  each Topical Daily  . cholecalciferol  2,000 Units Oral Daily  . insulin aspart  0-5 Units Subcutaneous QHS  . insulin aspart  0-9 Units Subcutaneous TID WC  . mupirocin ointment  1 application Nasal BID  . pantoprazole  40 mg Oral Daily  . pneumococcal 23 valent vaccine  0.5 mL Intramuscular Tomorrow-1000  . Travoprost (BAK Free)  1 drop Left Eye QHS   Continuous Infusions: . sodium chloride 50 mL/hr at 01/27/18 1005  . clindamycin (CLEOCIN) IV      Assessment/Plan:   1. Left hip fracture requiring operative repair.  Hopefully surgery will be done this evening. 2. History of orthostatic hypotension.  Holding Norvasc at this time 3. Chronic atrial fibrillation holding Eliquis.  Stroke risk higher off the Eliquis.  Continue low-dose amiodarone. 4. Type 2 diabetes on sliding scale at this point.  Continue to hold [place] provide 5. Hyperlipidemia unspecified on Lipitor 6. GERD on PPI 7. Glaucoma continue eyedrops 8. Left ear hematoma 9. Continue oxygen supplementation from right now. 10. Drop in hemoglobin and anemia.  Continue to watch closely. 11. 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  Code Status:     Code Status Orders  (From admission, onward)         Start     Ordered   01/24/18 0758  Full code  Continuous     01/24/18 0758        Code Status History    Date Active Date Inactive Code Status Order ID Comments User Context   01/05/2018 1537 01/08/2018 1714 Full Code [id number]  Loletha Grayer, MD ED   04/03/2017 2205 04/09/2017 2050 Full Code [id number]  Salary, Avel Peace, MD Inpatient   12/10/2016 1327 12/12/2016 1517 Full Code [id number]  Clyde Canterbury, MD Inpatient   04/07/2016 2108 04/10/2016 1706 Full Code [id number]  Vaughan Basta, MD Inpatient   12/12/2014 1452 12/13/2014 1828 Full Code [id number]  Isaias Cowman, MD Inpatient   12/09/2014 2303 12/12/2014 1452 Full Code [id number]  Baxter Hire, MD Inpatient    Advance Directive  Documentation     Most Recent Value  Type of Advance Directive  Healthcare Power of Attorney, Living will  Pre-existing out of facility DNR order (yellow form or pink MOST form)  -  "MOST" Form in Place?  -     Family Communication: Spoke with son yesterday Disposition Plan: Son would like to take home  Consultants:  Orthopedic surgery  Time spent: 24 minutes  [place]

## 2018-01-27 NOTE — Anesthesia Preprocedure Evaluation (Signed)
Anesthesia Evaluation  Patient identified by MRN, date of birth, ID band Patient awake    Reviewed: Allergy & Precautions, H&P , NPO status , Patient's Chart, lab work & pertinent test results, reviewed documented beta blocker date and time   Airway Mallampati: III   Neck ROM: full    Dental  (+) Poor Dentition   Pulmonary neg pulmonary ROS, asthma , sleep apnea , COPD,    Pulmonary exam normal        Cardiovascular Exercise Tolerance: Poor hypertension, On Medications + CAD and +CHF  negative cardio ROS Normal cardiovascular exam+ pacemaker  Rhythm:regular Rate:Normal     Neuro/Psych negative neurological ROS  negative psych ROS   GI/Hepatic negative GI ROS, Neg liver ROS, GERD  Medicated,  Endo/Other  negative endocrine ROSdiabetesHyperthyroidism   Renal/GU Renal diseasenegative Renal ROS  negative genitourinary   Musculoskeletal   Abdominal   Peds  Hematology negative hematology ROS (+)   Anesthesia Other Findings Past Medical History: No date: A-fib ([place]) No date: Arthritis     Comment:  Osteoarthritis No date: Asthma No date: Chronic kidney disease     Comment:  UTI No date: COPD (chronic obstructive pulmonary disease) (HCC) No date: Coronary artery disease No date: Diabetes mellitus without complication (HCC) No date: GERD (gastroesophageal reflux disease) No date: Glaucoma (increased eye pressure) No date: HOH (hard of hearing)     Comment:  Left Hearing Aid No date: Hypertension No date: Hyperthyroidism No date: On home oxygen therapy     Comment:  uses at night No date: Presence of permanent cardiac pacemaker No date: Sleep apnea     Comment:  No C-PAP Past Surgical History: No date: ABDOMINAL HYSTERECTOMY 01/08/2018: ESOPHAGOGASTRODUODENOSCOPY (EGD) WITH PROPOFOL; N/A     Comment:  Procedure: ESOPHAGOGASTRODUODENOSCOPY (EGD) WITH               PROPOFOL;  Surgeon: Jonathon Bellows, MD;  Location:  [facility]               ENDOSCOPY;  Service: Gastroenterology;  Laterality: N/A; No date: EYE SURGERY; Left     Comment:  Cataract Extraction with IOL No date: EYE SURGERY; Right     Comment:  Artificial Eye 04/07/2017: HIP ARTHROPLASTY; Right     Comment:  Procedure: ARTHROPLASTY BIPOLAR HIP (HEMIARTHROPLASTY);               Surgeon: Dereck Leep, MD;  Location: [facility] ORS;                Service: Orthopedics;  Laterality: Right; No date: INSERT / REPLACE / REMOVE PACEMAKER 12/12/2014: PACEMAKER INSERTION; Left     Comment:  Procedure: INSERTION PACEMAKER;  Surgeon: Isaias Cowman, MD;  Location: [facility] ORS;  Service:               Cardiovascular;  Laterality: Left; 12/10/2016: PAROTIDECTOMY; Left     Comment:  Procedure: [id number];  Surgeon: Clyde Canterbury, MD;                Location: [facility] ORS;  Service: ENT;  Laterality: Left; 12/10/2016: RADICAL NECK DISSECTION; Left     Comment:  Procedure: RADICAL NECK DISSECTION;  Surgeon: Clyde Canterbury, MD;  Location: [facility] ORS;  Service: ENT;                Laterality:  Left; BMI    Body Mass Index:  21.32 kg/m     Reproductive/Obstetrics negative OB ROS                             Anesthesia Physical Anesthesia Plan  ASA: IV and emergent  Anesthesia Plan: General   Post-op Pain Management:    Induction:   PONV Risk Score and Plan:   Airway Management Planned:   Additional Equipment:   Intra-op Plan:   Post-operative Plan:   Informed Consent: I have reviewed the patients History and Physical, chart, labs and discussed the procedure including the risks, benefits and alternatives for the proposed anesthesia with the patient or authorized representative who has indicated his/her understanding and acceptance.   Dental Advisory Given  Plan Discussed with: CRNA  Anesthesia Plan Comments: (Pt is high risk for any procedure.  Will plan on SAB for above. Off eliquis for four  days.  Situation discussed with son.  JA)        Anesthesia Quick Evaluation

## 2018-01-27 NOTE — Anesthesia Postprocedure Evaluation (Signed)
Anesthesia Post Note  Patient: Sarah Dougherty  Procedure(s) Performed: INTRAMEDULLARY (IM) NAIL INTERTROCHANTRIC ( TFNA ) (Left Hip)  Patient location during evaluation: PACU Anesthesia Type: General Level of consciousness: awake and alert Pain management: pain level controlled Vital Signs Assessment: post-procedure vital signs reviewed and stable Respiratory status: spontaneous breathing, nonlabored ventilation, respiratory function stable and patient connected to nasal cannula oxygen Cardiovascular status: blood pressure returned to baseline and stable Postop Assessment: no apparent nausea or vomiting Anesthetic complications: no     Last Vitals:  Vitals:   01/27/18 2144 01/27/18 2159  BP: 111/61 (!) 127/59  Pulse: (!) 59 60  Resp: 13 16  Temp: 36.4 C   SpO2: 94% 98%    Last Pain:  Vitals:   01/27/18 1647  TempSrc: Oral  PainSc:                  Molli Barrows

## 2018-01-27 NOTE — Op Note (Signed)
OPERATIVE NOTE  DATE OF SURGERY:  01/27/2018  PATIENT NAME:  Sarah Dougherty   DOB: 1921/09/29  MRN: [id number]  PRE-OPERATIVE DIAGNOSIS: Left intertrochanteric femur fracture  POST-OPERATIVE DIAGNOSIS:  Same  PROCEDURE: Open reduction and internal fixation of a left intertrochanteric femur fracture   SURGEON:  Marciano Sequin. M.D.  ANESTHESIA: spinal  ESTIMATED BLOOD LOSS: 50 mL  FLUIDS REPLACED: 400 mL of crystalloid  DRAINS: None  IMPLANTS UTILIZED: Synthes 11 mm/130 trochanteric fixation nail, 85 mm helical blade, 32 mm x 5.0 mm locking screw  INDICATIONS FOR SURGERY: Sarah Dougherty is a 84 y.o. year old female who fell and sustained a displaced left intertrochanteric femur fracture. After discussion of the risks and benefits of surgical intervention, the patient expressed understanding of the risks benefits and agree with plans for open reduction and internal fixation.   The risks, benefits, and alternatives were discussed at length including but not limited to the risks of infection, bleeding, nerve injury, stiffness, blood clots, the need for revision surgery, limb length inequality, cardiopulmonary complications, among others, and they were willing to proceed.  PROCEDURE IN DETAIL: The patient was brought into the operating room and, after adequate spinal anesthesia was achieved, patient was placed on the fracture table. All bony prominences were well-padded. The left lower extremity was placed in traction and a provisional reduction was performed and verified using the C-arm. The patient's left hip and leg were cleaned and prepped with alcohol and DuraPrep and draped in the usual sterile fashion. A "timeout" was performed as per usual protocol. A lateral incision was made extended from the proximal portion of the greater trochanter proximally. The fascia was incised in line with the skin incision and the fibers of the hip abductors were split in line. The tip of the  greater trochanter was palpated and a distally threaded guide pin was inserted into the tip of the greater trochanter and advanced into the medullary canal. Position was confirmed in both AP and lateral planes using the C-arm. A pilot hole was enlarged using a step drill. A Synthes 11 mm/130 trochanteric fixation nail was advanced over the guidepin and position confirmed using the C-arm. A second stab incision was made and the tissue protector was inserted through the outrigger device and advanced to the lateral cortex of the femur. A threaded screw guide pin was inserted into the femoral neck and head and position was again confirmed in both AP and lateral planes. Vision was were obtained and it was felt that an 85 mm helical blade was appropriate. The cortex was reamed and then a cannulated reamer was advanced over the guidepin to the appropriate depth. An 85 mm helical blade was then advanced over the guidepin and impacted into place. Good position was noted in multiple planes using the C-arm. The locking sleeve was engaged. Finally, a third stab incision was made and the tissue protector was inserted through the outrigger device and advanced the lateral cortex of the femur for placement of the distal locking screw. A 32 mm x 5.0 mm locking screw was then inserted. The outrigger device was removed. The hip was visualized in all planes using the C-arm with good reduction appreciated and good position of the hardware noted.  The wound was irrigated with copious amounts of normal saline with antibiotic solution and suctioned dry. Good hemostasis was appreciated. The fascia was reapproximated using interrupted sutures of #1 Vicryl. Subcutaneous tissue was approximated layers using first #0 Vicryl followed #2-0 Vicryl.  The skin was closed with skin staples. A sterile dressing was applied.  The patient tolerated the procedure well and was transported to the recovery room in stable condition.   Marciano Sequin., M.D.

## 2018-01-28 ENCOUNTER — Encounter: Payer: Self-pay | Admitting: Orthopedic Surgery

## 2018-01-28 ENCOUNTER — Encounter
Admission: RE | Admit: 2018-01-28 | Discharge: 2018-01-28 | Disposition: A | Discharge status: Home / Self Care | Payer: PPO | Source: Ambulatory Visit | Attending: Internal Medicine | Admitting: Internal Medicine

## 2018-01-28 LAB — CBC
HCT: 31.8 % — ABNORMAL LOW (ref 36.0–46.0)
Hemoglobin: 9.5 g/dL — ABNORMAL LOW (ref 12.0–15.0)
MCH: 29 pg (ref 26.0–34.0)
MCHC: 29.9 g/dL — ABNORMAL LOW (ref 30.0–36.0)
MCV: 97 fL (ref 80.0–100.0)
NRBC: 0 % (ref 0.0–0.2)
PLATELETS: 292 10*3/uL (ref 150–400)
RBC: 3.28 MIL/uL — ABNORMAL LOW (ref 3.87–5.11)
RDW: 14 % (ref 11.5–15.5)
WBC: 7.8 10*3/uL (ref 4.0–10.5)

## 2018-01-28 LAB — GLUCOSE, CAPILLARY
Glucose-Capillary: 134 mg/dL — ABNORMAL HIGH (ref 70–99)
Glucose-Capillary: 245 mg/dL — ABNORMAL HIGH (ref 70–99)
Glucose-Capillary: 136 mg/dL — ABNORMAL HIGH (ref 70–99)

## 2018-01-28 MED ORDER — APIXABAN 2.5 MG PO TABS
2.5000 mg | ORAL_TABLET | Freq: Two times a day (BID) | ORAL | Status: DC
  Administered 2018-01-29: 2.5 mg via ORAL
  Filled 2018-01-28: qty 1

## 2018-01-28 NOTE — Progress Notes (Signed)
Foley cath removed .

## 2018-01-28 NOTE — Progress Notes (Signed)
Physical Therapy Treatment Patient Details Name: Sarah Dougherty MRN: [id number] DOB: 07-29-1921 Today's Date: 01/28/2018    History of Present Illness Pt is a 84 y.o. female with a known history of orthostatic hypotension presented after a fall.  The patient stated that she went to bed and she was getting up to change and probably got up too quickly and thinks she passed out.  Pt found to have a L hip fracture.  Pt is now s/p open reduction and internal fixation of a left intertrochanteric femur fracture.  Assessment also includes: chronic A-fib, DM II, HLD, GERD, COPD on home O2, CKD, and CAD.       PT Comments    Pt presents with deficits in strength, transfers, mobility, gait, balance, and activity tolerance.  Pt continues to require extensive assistance with all bed mobility tasks.  Pt/son education provided on sequencing for bed mobility and transfers with pt only able to take several very small steps at the EOB before requiring to return to sitting.  Pt had difficultly lifting the RLE to advance it during amb and mostly shuffled that foot forward/backward while walking.  Pt required encouragement to participate this session but effort improved as the session progressed.  Pt will benefit from PT services in a SNF setting upon discharge to safely address above deficits for decreased caregiver assistance and eventual return to PLOF.     Follow Up Recommendations  SNF;Supervision for mobility/OOB     Equipment Recommendations  None recommended by PT    Recommendations for Other Services       Precautions / Restrictions Precautions Precautions: Fall Restrictions Weight Bearing Restrictions: Yes LLE Weight Bearing: Weight bearing as tolerated    Mobility  Bed Mobility Overal bed mobility: Needs Assistance Bed Mobility: Supine to Sit;Sit to Supine     Supine to sit: Mod assist Sit to supine: Mod assist   General bed mobility comments: Mod A for BLEs in and out of bed and for  coming up to full upright sitting position  Transfers Overall transfer level: Needs assistance Equipment used: Rolling walker (2 wheeled) Transfers: Sit to/from Stand Sit to Stand: From elevated surface;Mod assist         General transfer comment: Mod verbal and tactile cues for proper sequencing   Ambulation/Gait Ambulation/Gait assistance: Min assist;Mod assist Gait Distance (Feet): 3 Feet Assistive device: Rolling walker (2 wheeled) Gait Pattern/deviations: Leaning posteriorly;Step-to pattern;Antalgic Gait velocity: Decreased   General Gait Details: Min-mod A during limited amb to progress the RW and to prevent posterior LOB   Stairs             Wheelchair Mobility    Modified Rankin (Stroke Patients Only)       Balance Overall balance assessment: Needs assistance Sitting-balance support: No upper extremity supported Sitting balance-Leahy Scale: Fair     Standing balance support: Bilateral upper extremity supported Standing balance-Leahy Scale: Poor Standing balance comment: Posterior instability                            Cognition Arousal/Alertness: Awake/alert Behavior During Therapy: WFL for tasks assessed/performed Overall Cognitive Status: Difficult to assess                                 General Comments: Pt agitated about getting to use the bedpan fast enough when she felt her bowels move and when having  difficulty moving her bowels, she tried to reach to her anal area stating that she helps with her hand at home.  NSG placed glove on her hand to help with hygiene and sanitation.  Pt was taking a while on bedpan and rec use of BSC vs bedpan to have gravity help with her bowels.  NSG to talk to MD about remedies for this using medication.      Exercises Total Joint Exercises Ankle Circles/Pumps: AROM;AAROM;Both;10 reps Quad Sets: Strengthening;Both;10 reps Heel Slides: AROM;AAROM;Both;10 reps Hip ABduction/ADduction:  AROM;AAROM;Both;10 reps Long Arc Quad: AROM;Both;10 reps Knee Flexion: AROM;Both;10 reps Other Exercises Other Exercises: Static standing at EOB progressing to weight shifting L/R Other Exercises: HEP with pt and son for BLE APs, QS, and LAQs x 10 each 5-6x/day    General Comments        Pertinent Vitals/Pain Pain Assessment: Faces Pain Score: 6  Faces Pain Scale: Hurts even more Pain Location: L hip Pain Descriptors / Indicators: Aching;Sore Pain Intervention(s): Premedicated before session;Monitored during session    [place] expects to be discharged to:: Private residence(History provided by son) Living Arrangements: Children Available Help at Discharge: Family;Available 24 hours/day Type of Home: House Home Access: Ramped entrance   Home Layout: One level Home Equipment: Walker - 2 wheels;Walker - 4 wheels;Cane - single point;Toilet riser;Shower seat      Prior Function Level of Independence: Needs assistance  Gait / Transfers Assistance Needed: Mod Ind amb with a RW, 2 falls in the last year ADL's / Homemaking Assistance Needed: Ind with dressing with family assisting with bathing Comments: Patient lives with her son who provides 24 hour care.  Patient was in the bedroom when she fell and thinks she passed out.  Patient is hard of hearing and usually wears hearing aids, son to bring in tomorrow. She also has an artificial eye on the right .   PT Goals (current goals can now be found in the care plan section) Acute Rehab PT Goals Patient Stated Goal: "to go home" PT Goal Formulation: Patient unable to participate in goal setting Time For Goal Achievement: 02/10/18 Potential to Achieve Goals: Fair    Frequency    BID      PT Plan Current plan remains appropriate    Co-evaluation              AM-PAC PT "6 Clicks" Daily Activity  Outcome Measure  Difficulty turning over in bed (including adjusting bedclothes, sheets and blankets)?:  Unable Difficulty moving from lying on back to sitting on the side of the bed? : Unable Difficulty sitting down on and standing up from a chair with arms (e.g., wheelchair, bedside commode, etc,.)?: Unable Help needed moving to and from a bed to chair (including a wheelchair)?: A Lot Help needed walking in hospital room?: Total Help needed climbing 3-5 steps with a railing? : Total 6 Click Score: 7    End of Session Equipment Utilized During Treatment: Gait belt Activity Tolerance: Patient limited by pain Patient left: in bed;with bed alarm set;Other (comment);with family/visitor present(Nursing contacted and getting pt a call bell) Nurse Communication: Mobility status PT Visit Diagnosis: Unsteadiness on feet (R26.81);Muscle weakness (generalized) (M62.81);Other abnormalities of gait and mobility (R26.89)     Time: 1610-9604 PT Time Calculation (min) (ACUTE ONLY): 24 min  Charges:  $Therapeutic Exercise: 8-22 mins $Therapeutic Activity: 8-22 mins                     D. Nicki Reaper  Adellyn Capek PT, DPT 01/28/18, 5:02 PM

## 2018-01-28 NOTE — Evaluation (Signed)
Physical Therapy Evaluation Patient Details Name: Sarah Dougherty MRN: [id number] DOB: March 30, 1922 Today's Date: 01/28/2018   History of Present Illness  Pt is a 84 y.o. female with a known history of orthostatic hypotension presented after a fall.  The patient stated that she went to bed and she was getting up to change and probably got up too quickly and thinks she passed out.  Pt found to have a L hip fracture.  Pt is now s/p open reduction and internal fixation of a left intertrochanteric femur fracture.  Assessment also includes: chronic A-fib, DM II, HLD, GERD, COPD on home O2, CKD, and CAD.       Clinical Impression  Pt presents with deficits in strength, transfers, mobility, gait, balance, and activity tolerance.  Pt required extensive assistance with bed mobility tasks, transfers, and amb and was only able to amb several feet with assist needed to prevent posterior LOB.  Pt presents with a significant decline in functional mobility levels compared to her baseline as stated by family.  Due to the amount of physical assistance the pt requires and her significant current fall risk the pt would not be safe to return to her prior living situation at this time.  Pt will benefit from PT services in a SNF setting upon discharge to safely address above deficits for decreased caregiver assistance and eventual return to PLOF.      Follow Up Recommendations SNF;Supervision for mobility/OOB    Equipment Recommendations  None recommended by PT    Recommendations for Other Services       Precautions / Restrictions Precautions Precautions: Fall Restrictions Weight Bearing Restrictions: Yes LLE Weight Bearing: Weight bearing as tolerated      Mobility  Bed Mobility Overal bed mobility: Needs Assistance Bed Mobility: Supine to Sit;Sit to Supine     Supine to sit: Mod assist Sit to supine: Mod assist   General bed mobility comments: Mod A for BLEs in and out of bed and for coming up to  full upright sitting position  Transfers Overall transfer level: Needs assistance Equipment used: Rolling walker (2 wheeled) Transfers: Sit to/from Stand Sit to Stand: From elevated surface;Mod assist         General transfer comment: Mod verbal and tactile cues for proper sequencing   Ambulation/Gait Ambulation/Gait assistance: Min assist;Mod assist Gait Distance (Feet): 3 Feet Assistive device: Rolling walker (2 wheeled) Gait Pattern/deviations: Leaning posteriorly;Step-to pattern;Antalgic Gait velocity: Decreased   General Gait Details: Min-mod A during limited amb to progress the RW and to prevent posterior LOB  Stairs            Wheelchair Mobility    Modified Rankin (Stroke Patients Only)       Balance Overall balance assessment: Needs assistance Sitting-balance support: No upper extremity supported Sitting balance-Leahy Scale: Fair     Standing balance support: Bilateral upper extremity supported Standing balance-Leahy Scale: Poor Standing balance comment: Posterior instability                             Pertinent Vitals/Pain Pain Assessment: Faces Faces Pain Scale: Hurts even more Pain Location: L hip and pain from constipation per patient Pain Descriptors / Indicators: Aching;Sore Pain Intervention(s): Premedicated before session;Monitored during session;Limited activity within patient's tolerance    Home Living Family/patient expects to be discharged to:: Private residence(History provided by son) Living Arrangements: Children Available Help at Discharge: Family;Available 24 hours/day Type of Home: House Home Access:  Ramped entrance     Home Layout: One level Home Equipment: [place] - 2 wheels;Walker - 4 wheels;Cane - single point;Toilet riser;Shower seat      Prior Function Level of Independence: Needs assistance   Gait / Transfers Assistance Needed: Mod Ind amb with a RW, 2 falls in the last year  ADL's / Homemaking  Assistance Needed: Ind with dressing with family assisting with bathing  Comments: Patient lives with her son who provides 24 hour care.  Patient was in the bedroom when she fell and thinks she passed out.  Patient is hard of hearing and usually wears hearing aids, son to bring in tomorrow. She also has an artificial eye on the right .     Hand Dominance   Dominant Hand: Right    Extremity/Trunk Assessment   Upper Extremity Assessment Upper Extremity Assessment: Defer to OT evaluation    Lower Extremity Assessment Lower Extremity Assessment: Generalized weakness       Communication   Communication: HOH  Cognition Arousal/Alertness: Awake/alert Behavior During Therapy: Agitated Overall Cognitive Status: Difficult to assess                                 General Comments: Pt agitated about getting to use the bedpan fast enough when she felt her bowels move and when having difficulty moving her bowels, she tried to reach to her anal area stating that she helps with her hand at home.  NSG placed glove on her hand to help with hygiene and sanitation.  Pt was taking a while on bedpan and rec use of BSC vs bedpan to have gravity help with her bowels.  NSG to talk to MD about remedies for this using medication.      General Comments      Exercises Other Exercises Other Exercises: Multiple sit to/from stand transfer training from various height surfaces Other Exercises: Static standing training with BUE support for improved activity tolerance with pt able to stand for a max of 2 min x 2   Assessment/Plan    PT Assessment Patient needs continued PT services  PT Problem List Decreased strength;Decreased activity tolerance;Decreased balance;Decreased knowledge of use of DME;Decreased mobility;Decreased safety awareness       PT Treatment Interventions DME instruction;Functional mobility training;Therapeutic activities;Therapeutic exercise;Balance  training;Patient/family education;Gait training    PT Goals (Current goals can be found in the Care Plan section)  Acute Rehab PT Goals Patient Stated Goal: "to go home" PT Goal Formulation: Patient unable to participate in goal setting Time For Goal Achievement: 02/10/18 Potential to Achieve Goals: Fair    Frequency BID   Barriers to discharge Inaccessible home environment      Co-evaluation               AM-PAC PT "6 Clicks" Daily Activity  Outcome Measure Difficulty turning over in bed (including adjusting bedclothes, sheets and blankets)?: Unable Difficulty moving from lying on back to sitting on the side of the bed? : Unable Difficulty sitting down on and standing up from a chair with arms (e.g., wheelchair, bedside commode, etc,.)?: Unable Help needed moving to and from a bed to chair (including a wheelchair)?: A Lot Help needed walking in hospital room?: Total Help needed climbing 3-5 steps with a railing? : Total 6 Click Score: 7    End of Session Equipment Utilized During Treatment: Gait belt Activity Tolerance: Patient limited by pain Patient left: in bed;Other (  comment);with family/visitor present(Nursing present with pt assisting with hygiene at end of session) Nurse Communication: Mobility status PT Visit Diagnosis: Unsteadiness on feet (R26.81);Muscle weakness (generalized) (M62.81);Other abnormalities of gait and mobility (R26.89)    Time: 1120-1150 PT Time Calculation (min) (ACUTE ONLY): 30 min   Charges:   PT Evaluation $PT Eval Low Complexity: 1 Low PT Treatments $Therapeutic Activity: 8-22 mins       D. Scott Linsi Humann PT, DPT 01/28/18, 1:43 PM

## 2018-01-28 NOTE — Evaluation (Signed)
Occupational Therapy Evaluation Patient Details Name: Sarah Dougherty MRN: [id number] DOB: 1922-04-13 Today's Date: 01/28/2018    History of Present Illness Sarah Dougherty is 84 y/o woman Sarah Dougherty  is a 84 y.o. female with a known history of orthostatic hypotension presents after a fall.  The patient states that she went to bed and she was getting up to change and probably got up too quickly and thinks she passed out.  Normally she walks with a walker.  She had a fall on the carpet and has a hematoma on the left ear and found to have a hip fracture. She is s/p L ORIF on 01-27-18 by Dr Marry Guan.   Clinical Impression   Pt is 84year old woman s/p L hip ORIF after falling and fracturing her hip while getting up to go to the bathroom and thinks she passed out and hit her L side of head on the dresser.  She  lives at home with her son who has been caring for her 24/7.  Pt was independent in all dressing and sponge bathing prior to surgery with help grocery shopping and laundry with use of FWW for mobiltiy and is eager to return to PLOF. 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  Pt is currently limited in functional ADLs due to pain and decreased ROM.  Pt requires maximum to total assist for LB dressing and toileting skills due to pain and decreased AROM of L LE and would benefit from continued skilled OT services for education in assistive devices, functional mobility, and education in recommendations for home modifications to increase safety and prevent falls. Pt reports she has been doing digital stimulation for managing bowel movements and NSG aware and to discuss with MD to better manage for hygiene and sanitation reasons. Pt did not seem aware of the fact that doing this was unsanitary when using her own bare hand vs gloves or the risk of injury to area. Pt is a good candidate for SNF to continue rehabilitation but her son Sarah Dougherty has declined SNF for her and insists on taking her home with [facility].  Rec OT HH  after discharge.      Follow Up Recommendations  SNF(Rec SNF but her son Sarah Dougherty insists on no SNF and plans to take her home with [facility])    Equipment Recommendations       Recommendations for Other Services       Precautions / Restrictions Precautions Precautions: Fall Restrictions Weight Bearing Restrictions: Yes LLE Weight Bearing: Weight bearing as tolerated      Mobility Bed Mobility                  Transfers                      Balance                                           ADL either performed or assessed with clinical judgement   ADL Overall ADL's : Needs assistance/impaired Eating/Feeding: Maximal assistance;Set up Eating/Feeding Details (indicate cue type and reason): Pt states that her son feeds her every meal even though she is capable of feeding herself.  Some arthritis changes in joints but able to hold utensils. Grooming: Wash/dry hands;Wash/dry face;Oral care;Brushing hair;Set up;Minimal assistance  Lower Body Dressing: Total assistance;Set up Lower Body Dressing Details (indicate cue type and reason): Pt in pain limiting participation in use of AD and is currently dependent for LB dressing skills.  Toilet Transfer: Set up;Maximal assistance Toilet Transfer Details (indicate cue type and reason): Pt currently using bedpan due to being in a lot of pain in bed and did not want to use BSC but rec use of BSC if possible to help her move her bowels since she does own "digital stimulation" at home.           General ADL Comments: Pt is HOH and yells out with any movement of LLE during bed pan use but rec use of BSC to help with bowels and NSG aware of her doing own "digital stimulation" at home with concerns about hygiene and sanitation.       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     Perception     Praxis       Pertinent Vitals/Pain Pain Assessment: Faces Faces Pain Scale: Hurts even more Pain Location: L hip Pain Descriptors / Indicators: Constant;Discomfort;Sore Pain Intervention(s): Limited activity within patient's tolerance;Monitored during session;Premedicated before session;Ice applied;Repositioned     Hand Dominance Right   Extremity/Trunk Assessment Upper Extremity Assessment Upper Extremity Assessment: Generalized weakness(AROM limited to about 95-100 degrees in shoulder flexion but able to reach face and head for ADLs )   Lower Extremity Assessment Lower Extremity Assessment: Generalized weakness       Communication Communication Communication: HOH   Cognition Arousal/Alertness: Awake/alert Behavior During Therapy: WFL for tasks assessed/performed;Agitated Overall Cognitive Status: Within Functional Limits for tasks assessed                                 General Comments: Pt agitated about getting to use the bedpan fast enough when she felt her bowels move and when having difficulty moving her bowels, she tried to reach to her anal area stating that she helps with her hand at home.  NSG placed glove on her hand to help with hygiene and sanitation.  Pt was taking a while on bedpan and rec use of BSC vs bedpan to have gravity help with her bowels.  NSG to talk to MD about remedies for this using medication.   General Comments       Exercises     Shoulder Instructions      Home Living Family/patient expects to be discharged to:: Private residence Living Arrangements: Children Available Help at Discharge: Family Type of Home: House Home Access: [place]: One level     Bathroom Shower/Tub: Teacher, early years/pre: [place]: Environmental consultant - 2 wheels;Walker - 4 wheels;Cane - single point;Toilet riser;Shower seat          Prior Functioning/Environment Level of Independence: Needs assistance     ADL's / Homemaking Assistance Needed: Patient was receiving Irene services at home with PT prior to admission and was admitted to the hospital a month ago for abdominal mass.  Per chart review, pt was independent with meidcation mgt and ADLs and helped with laundry and cooking but needed help for grocery shopping and meal prep.     Comments: Patient lives with her son who provides 24 hour care.  Patient was in the  bedroom when she fell and thinks she passed out.  Patient is hard of hearing and usually wears hearing aids, son to bring in tomorrow. She also has an artificial eye on the right .        OT Problem List: Decreased strength;Impaired balance (sitting and/or standing);Decreased activity tolerance;Decreased safety awareness;Pain;Decreased knowledge of use of DME or AE      OT Treatment/Interventions: Self-care/ADL training;DME and/or AE instruction;Balance training;Patient/family education    OT Goals(Current goals can be found in the care plan section) Acute Rehab OT Goals Patient Stated Goal: "to go home" OT Goal Formulation: With patient Time For Goal Achievement: 02/11/18 Potential to Achieve Goals: Good ADL Goals Pt Will Perform Lower Body Dressing: with mod assist;with set-up;sit to/from stand Pt Will Transfer to Toilet: stand pivot transfer;with set-up;bedside commode;with mod assist  OT Frequency: Min 2X/week   Barriers to D/C:            Co-evaluation              AM-PAC PT "6 Clicks" Daily Activity     Outcome Measure Help from another person eating meals?: A Lot Help from another person taking care of personal grooming?: A Little Help from another person toileting, which includes using toliet, bedpan, or urinal?: A Lot Help from another person bathing (including washing, rinsing, drying)?: A Lot Help from another person to put on and taking off regular upper body clothing?: A Little Help from another person to put on and taking off regular lower body  clothing?: Total 6 Click Score: 13   End of Session Nurse Communication: Other (comment)(pt needed bedpan at end of session)  Activity Tolerance: Patient limited by pain Patient left: in bed;with call bell/phone within reach;with nursing/sitter in room;Other (comment)(RN and CNA in room with her while on bedpan)  OT Visit Diagnosis: Muscle weakness (generalized) (M62.81);Pain;History of falling (Z91.81);Unsteadiness on feet (R26.81);Repeated falls (R29.6) Pain - Right/Left: Left Pain - part of body: Hip                Time: 1030-1100 OT Time Calculation (min): 30 min Charges:  OT General Charges $OT Visit: 1 Visit OT Evaluation $OT Eval Low Complexity: 1 Low OT Treatments $Self Care/Home Management : 8-22 mins  Chrys Racer, OTR/L ascom [phone] 01/28/18, 1:25 PM

## 2018-01-28 NOTE — Progress Notes (Signed)
Notified Dr. Marry Guan, via office Nurse that patient has lost iv access and we are unable to administer iv tylenol Patient taking po meds at this time

## 2018-01-28 NOTE — Progress Notes (Signed)
   Subjective: 1 Day Post-Op Procedure(s) (LRB): INTRAMEDULLARY (IM) NAIL INTERTROCHANTRIC ( TFNA ) (Left) Patient reports pain as mild.   Patient is well, and has had no acute complaints or problems We will start therapy today.  Plan is to go Home after hospital stay. no nausea and no vomiting Patient denies any chest pains or shortness of breath. Objective: Vital signs in last 24 hours: Temp:  [97 F (36.1 C)-98.4 F (36.9 C)] 98 F (36.7 C) (10/17 0309) Pulse Rate:  [59-62] 60 (10/17 0209) Resp:  [13-21] 18 (10/17 0209) BP: (91-136)/(56-65) 104/65 (10/17 0209) SpO2:  [94 %-100 %] 99 % (10/17 0209) well approximated incision Heels are non tender and elevated off the bed using rolled towels Intake/Output from previous day: 10/16 0701 - 10/17 0700 In: 771.7 [I.V.:571.7; IV Piggyback:200] Out: 650 [Urine:600; Blood:50] Intake/Output this shift: No intake/output data recorded.  Recent Labs    01/27/18 0426  HGB 9.4*   No results for input(s): WBC, RBC, HCT, PLT in the last 72 hours. No results for input(s): NA, K, CL, CO2, BUN, CREATININE, GLUCOSE, CALCIUM in the last 72 hours. No results for input(s): LABPT, INR in the last 72 hours.  EXAM General - Patient is Alert, Appropriate and Oriented Extremity - Neurologically intact Neurovascular intact Sensation intact distally Intact pulses distally Dorsiflexion/Plantar flexion intact No cellulitis present Compartment soft Dressing - dressing C/D/I Motor Function - intact, moving foot and toes well on exam.    Past Medical History:  Diagnosis Date  . A-fib ([place])   . Arthritis    Osteoarthritis  . Asthma   . Chronic kidney disease    UTI  . COPD (chronic obstructive pulmonary disease) ([place])   . Coronary artery disease   . Diabetes mellitus without complication ([place])   . GERD (gastroesophageal reflux disease)   . Glaucoma (increased eye pressure)   . HOH (hard of hearing)    Left Hearing Aid  . Hypertension    . Hyperthyroidism   . On home oxygen therapy    uses at night  . Presence of permanent cardiac pacemaker   . Sleep apnea    No C-PAP    Assessment/Plan: 1 Day Post-Op Procedure(s) (LRB): INTRAMEDULLARY (IM) NAIL INTERTROCHANTRIC ( TFNA ) (Left) Active Problems:   Hip fracture (HCC)  Estimated body mass index is 21.32 kg/m as calculated from the following:   Height as of this encounter: 4\' 10"  (1.473 m).   Weight as of this encounter: 46.3 kg. Advance diet Up with therapy D/C IV fluids Plan for discharge tomorrow Discharge home with home health  Labs: None DVT Prophylaxis - Lovenox, Foot Pumps and TED hose Weight-Bearing as tolerated to left leg D/C O2 and Pulse OX and try on Room Air Begin working on bowel movement Patient will need to follow-up in [facility] in 2 weeks for staple removal Recommend Lovenox 30 mg daily for 14 days after discharge No showering until the staples are removed Follow-up Dr. Marry Guan in 6 weeks  [name]. [place] 01/28/2018, 7:25 AM

## 2018-01-28 NOTE — Progress Notes (Signed)
Patient ID: Sarah Dougherty, female   DOB: [birth year], 84 y.o.   MRN: [id number]  [facility] PROGRESS NOTE  Sarah Dougherty JJH:[id number] DOB: [birth year] DOA: 01/24/2018 PCP: Perrin Maltese, MD  HPI/Subjective: No complaints of chest pain or shortness of breath.  Feeling well after surgery.  Some pain but not much.  Objective: Vitals:   01/28/18 0610 01/28/18 1023  BP: 116/65 104/65  Pulse: 61   Resp: 18   Temp: 97.8 F (36.6 C)   SpO2: 100%     Filed Weights   01/24/18 0547 01/24/18 1100  Weight: 48 kg 46.3 kg    ROS: Review of Systems  Constitutional: Negative for fever.  HENT: Positive for hearing loss.   Respiratory: Negative for cough and shortness of breath.   Cardiovascular: Negative for chest pain.  Gastrointestinal: Negative for abdominal pain.  Musculoskeletal: Positive for joint pain.  Neurological: Negative for dizziness.   Exam: Physical Exam  HENT:  Nose: No mucosal edema.  Mouth/Throat: No oropharyngeal exudate or posterior oropharyngeal edema.  Eyes: Conjunctivae and lids are normal.  Neck: No JVD present. Carotid bruit is not present. No edema present. No thyroid mass and no thyromegaly present.  Cardiovascular: S1 normal and S2 normal. Exam reveals no gallop.  No murmur heard. Pulses:      Dorsalis pedis pulses are 2+ on the right side, and 2+ on the left side.  Respiratory: No respiratory distress. She has no wheezes. She has no rhonchi. She has no rales.  GI: Soft. Bowel sounds are normal. There is no tenderness.  Musculoskeletal:       Right ankle: She exhibits no swelling.       Left ankle: She exhibits no swelling.  Lymphadenopathy:    She has no cervical adenopathy.  Neurological: She is alert. No cranial nerve deficit.  Skin: Skin is warm. No rash noted. Nails show no clubbing.  Psychiatric: She has a normal mood and affect.      Data Reviewed: Basic Metabolic Panel: Recent Labs  Lab 01/24/18 0602 01/25/18 0257  NA 141  143  K 3.8 3.9  CL 106 111  CO2 23 24  GLUCOSE 194* 168*  BUN 22 25*  CREATININE 0.79 0.76  CALCIUM 9.0 8.2*   Liver Function Tests: Recent Labs  Lab 01/24/18 0602  AST 18  ALT 13  ALKPHOS 68  BILITOT 1.4*  PROT 7.1  ALBUMIN 3.9   CBC: Recent Labs  Lab 01/24/18 0602 01/25/18 0257 01/27/18 0426 01/28/18 0829  WBC 9.5 9.7  --  7.8  NEUTROABS 6.9  --   --   --   HGB 11.7* 9.1* 9.4* 9.5*  HCT 38.0 30.5*  --  31.8*  MCV 93.8 96.2  --  97.0  PLT 357 286  --  292    CBG: Recent Labs  Lab 01/27/18 0919 01/27/18 1200 01/27/18 1649 01/27/18 2147 01/28/18 0808  GLUCAP 148* 127* 126* 106* 134*    Recent Results (from the past 240 hour(s))  Urine culture     Status: None   Collection Time: 01/24/18  6:02 AM  Result Value Ref Range Status   Specimen Description   Final    URINE, CATHETERIZED Performed at [facility], [address] [place], [place] [address]    Special Requests   Final    NONE Performed at [facility], [address] [place], [place] [address]    Culture   Final    NO GROWTH Performed  at [facility], [place] [address], [place], [place] [address]    Report Status 01/25/2018 FINAL  Final  Surgical pcr screen     Status: Abnormal   Collection Time: 01/24/18  7:09 PM  Result Value Ref Range Status   MRSA, PCR NEGATIVE NEGATIVE Final   Staphylococcus aureus POSITIVE (A) NEGATIVE Final    Comment: (NOTE) The Xpert SA Assay (FDA approved for NASAL specimens in patients 3 years of age and older), is one component of a comprehensive surveillance program. It is not intended to diagnose infection nor to guide or monitor treatment. Performed at [facility] [address] [place], [place] [address]      Scheduled Meds: . amiodarone  100 mg Oral Daily  . atorvastatin  20 mg Oral q1800  . brimonidine  1 drop Left Eye BID  . cholecalciferol  2,000 Units Oral Daily  . enoxaparin (LOVENOX) injection  40 mg  Subcutaneous Q24H  . insulin aspart  0-5 Units Subcutaneous QHS  . insulin aspart  0-9 Units Subcutaneous TID WC  . pantoprazole  40 mg Oral Daily  . pneumococcal 23 valent vaccine  0.5 mL Intramuscular Tomorrow-1000  . senna-docusate  1 tablet Oral BID  . Travoprost (BAK Free)  1 drop Left Eye QHS   Continuous Infusions: . sodium chloride Stopped (01/28/18 0024)  . acetaminophen 1,000 mg (01/28/18 3295)    Assessment/Plan:   1. Left hip fracture requiring operative repair.  Pain control.  Son interested in taking home.  Potentially Friday afternoon versus Saturday morning.  Home health will have to be set up. 2. History of orthostatic hypotension.  Holding Norvasc at this time 3. Chronic atrial fibrillation.  Son interested in going back on Eliquis for stroke prevention.  Continue low-dose amiodarone 4. Type 2 diabetes on sliding scale at this point. 5. Hyperlipidemia unspecified on Lipitor 6. GERD on PPI 7. Glaucoma continue eyedrops 8. Left ear hematoma 9. Try to taper off oxygen 10. Anemia.  Hemoglobin stable but on the lower side. 11. 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  Code Status:     Code Status Orders  (From admission, onward)         Start     Ordered   01/24/18 0758  Full code  Continuous     01/24/18 0758        Code Status History    Date Active Date Inactive Code Status Order ID Comments User Context   01/05/2018 1537 01/08/2018 1714 Full Code [id number]  Loletha Grayer, MD ED   04/03/2017 2205 04/09/2017 2050 Full Code [id number]  Salary, Avel Peace, MD Inpatient   12/10/2016 1327 12/12/2016 1517 Full Code [id number]  Clyde Canterbury, MD Inpatient   04/07/2016 2108 04/10/2016 1706 Full Code [id number]  Vaughan Basta, MD Inpatient   12/12/2014 1452 12/13/2014 1828 Full Code [id number]  Isaias Cowman, MD Inpatient   12/09/2014 2303 12/12/2014 1452 Full Code [id number]  Baxter Hire, MD Inpatient    Advance Directive Documentation      Most Recent Value  Type of Advance Directive  Healthcare Power of Attorney, Living will  Pre-existing out of facility DNR order (yellow form or pink MOST form)  -  "MOST" Form in Place?  -     Family Communication: Spoke with son at the bedside Disposition Plan: Son would like to take home  Consultants:  Orthopedic surgery  Time spent: 25 minutes  [place]

## 2018-01-28 NOTE — Progress Notes (Signed)
ANTICOAGULATION CONSULT NOTE - Initial Consult  Pharmacy Consult for Apixaban Indication: atrial fibrillation  Allergies  Allergen Reactions  . Amoxicillin Other (See Comments)    unknown  . Cephalexin Other (See Comments)    unknown  . Nitrofurantoin Other (See Comments)     Unknown  . Penicillins Other (See Comments)    Other reaction(s): Unknown  . Sulfa Antibiotics Hives and Itching  . Atorvastatin     Other reaction(s): Muscle Pain  . Azithromycin Rash    Patient Measurements: Height: 4\' 10"  (147.3 cm) Weight: 102 lb (46.3 kg) IBW/kg (Calculated) : 40.9 Heparin Dosing Weight:    Vital Signs: Temp: 97.8 F (36.6 C) (10/17 0610) Temp Source: Oral (10/17 0610) BP: 104/65 (10/17 1023) Pulse Rate: 61 (10/17 0610)  Labs: Recent Labs    01/27/18 0426 01/28/18 0829  HGB 9.4* 9.5*  HCT  --  31.8*  PLT  --  292    Estimated Creatinine Clearance: 27.8 mL/min (by C-G formula based on SCr of 0.76 mg/dL).   Medical History: Past Medical History:  Diagnosis Date  . A-fib ([place])   . Arthritis    Osteoarthritis  . Asthma   . Chronic kidney disease    UTI  . COPD (chronic obstructive pulmonary disease) ([place])   . Coronary artery disease   . Diabetes mellitus without complication ([place])   . GERD (gastroesophageal reflux disease)   . Glaucoma (increased eye pressure)   . HOH (hard of hearing)    Left Hearing Aid  . Hypertension   . Hyperthyroidism   . On home oxygen therapy    uses at night  . Presence of permanent cardiac pacemaker   . Sleep apnea    No C-PAP    Assessment: Patient is a 84yo female admitted for hip fracture. Patient with a history of afib and was taking Apixaban 2.5mg  bid prior to admission. Apixaban was initially held due to decrease in Hgb and pending surgery. Patient is now postop and is on Lovenox 40mg  SQ daily. Pharmacy consulted to restart Apixaban on 01/29/18 AM.   Plan:  Will restart Apixaban 2.5mg  PO bid beginning tomorrow morning.  Will discontinue Lovenox as dose for today has already been given and patient will be on full dose anticoagulation tomorrow.  Paulina Fusi, PharmD, BCPS 01/28/2018 3:03 PM

## 2018-01-28 NOTE — Progress Notes (Signed)
[facility] SNF authorization has been received, authorization # [id number]. Patient's son Carlis Abbott is aware of above. Clinical Education officer, museum (CSW) sent [employer] at [employer] a message making her aware of above.   [employer], LCSW [phone]

## 2018-01-28 NOTE — Progress Notes (Signed)
Clinical Social Worker (CSW) met with patient's son [name] to discuss SNF placement. Per [name] he is agreeable to SNF and prefers [facility]. CSW presented bed offers to [facility] and he chose [facility]. He is okay with a semi-private room. [facility] SNF authorization is pending. [facility] admissions coordiantor at [facility] is aware of above. CSW will continue to follow and assist as needed.   [employer], LCSW [phone]

## 2018-01-28 NOTE — Progress Notes (Signed)
PT is recommending SNF. Clinical Social Worker (CSW) met with patient's son [name] outside of patient's room and he stated that he wants to pursue SNF. Per [name] he will talk to his brothers [name] and [facility]. CSW made [name] aware that Health Team will have to approve SNF. [name] verbalized his understanding. CSW contacted patient's son Carlis Abbott and made him aware of above. Per Carlis Abbott he prefers to take patient home but will talk to his brothers about it. CSW started [facility] SNF authorization today. CSW attempted to contact patient's son [name] however he did not answer and a voicemail was left.   [employer], LCSW [phone]

## 2018-01-29 DIAGNOSIS — Z950 Presence of cardiac pacemaker: Secondary | ICD-10-CM | POA: Diagnosis not present

## 2018-01-29 DIAGNOSIS — Z7901 Long term (current) use of anticoagulants: Secondary | ICD-10-CM | POA: Diagnosis not present

## 2018-01-29 DIAGNOSIS — Z970 Presence of artificial eye: Secondary | ICD-10-CM | POA: Diagnosis not present

## 2018-01-29 DIAGNOSIS — N183 Chronic kidney disease, stage 3 (moderate): Secondary | ICD-10-CM | POA: Diagnosis not present

## 2018-01-29 DIAGNOSIS — E785 Hyperlipidemia, unspecified: Secondary | ICD-10-CM | POA: Diagnosis not present

## 2018-01-29 DIAGNOSIS — Z9181 History of falling: Secondary | ICD-10-CM | POA: Diagnosis not present

## 2018-01-29 DIAGNOSIS — Z23 Encounter for immunization: Secondary | ICD-10-CM | POA: Diagnosis not present

## 2018-01-29 DIAGNOSIS — S72002S Fracture of unspecified part of neck of left femur, sequela: Secondary | ICD-10-CM | POA: Diagnosis not present

## 2018-01-29 DIAGNOSIS — Y92003 Bedroom of unspecified non-institutional (private) residence as the place of occurrence of the external cause: Secondary | ICD-10-CM | POA: Diagnosis not present

## 2018-01-29 DIAGNOSIS — I129 Hypertensive chronic kidney disease with stage 1 through stage 4 chronic kidney disease, or unspecified chronic kidney disease: Secondary | ICD-10-CM | POA: Diagnosis not present

## 2018-01-29 DIAGNOSIS — Z8679 Personal history of other diseases of the circulatory system: Secondary | ICD-10-CM | POA: Diagnosis not present

## 2018-01-29 DIAGNOSIS — M80052D Age-related osteoporosis with current pathological fracture, left femur, subsequent encounter for fracture with routine healing: Secondary | ICD-10-CM | POA: Diagnosis not present

## 2018-01-29 DIAGNOSIS — I2510 Atherosclerotic heart disease of native coronary artery without angina pectoris: Secondary | ICD-10-CM | POA: Diagnosis not present

## 2018-01-29 DIAGNOSIS — W1839XA Other fall on same level, initial encounter: Secondary | ICD-10-CM | POA: Diagnosis not present

## 2018-01-29 DIAGNOSIS — E119 Type 2 diabetes mellitus without complications: Secondary | ICD-10-CM | POA: Diagnosis not present

## 2018-01-29 DIAGNOSIS — S00432D Contusion of left ear, subsequent encounter: Secondary | ICD-10-CM | POA: Diagnosis not present

## 2018-01-29 DIAGNOSIS — E1122 Type 2 diabetes mellitus with diabetic chronic kidney disease: Secondary | ICD-10-CM | POA: Diagnosis not present

## 2018-01-29 DIAGNOSIS — R262 Difficulty in walking, not elsewhere classified: Secondary | ICD-10-CM | POA: Diagnosis not present

## 2018-01-29 DIAGNOSIS — R1312 Dysphagia, oropharyngeal phase: Secondary | ICD-10-CM | POA: Diagnosis not present

## 2018-01-29 DIAGNOSIS — Z7401 Bed confinement status: Secondary | ICD-10-CM | POA: Diagnosis not present

## 2018-01-29 DIAGNOSIS — I482 Chronic atrial fibrillation, unspecified: Secondary | ICD-10-CM | POA: Diagnosis not present

## 2018-01-29 DIAGNOSIS — Z9001 Acquired absence of eye: Secondary | ICD-10-CM | POA: Diagnosis not present

## 2018-01-29 DIAGNOSIS — I10 Essential (primary) hypertension: Secondary | ICD-10-CM | POA: Diagnosis not present

## 2018-01-29 DIAGNOSIS — D649 Anemia, unspecified: Secondary | ICD-10-CM | POA: Diagnosis not present

## 2018-01-29 DIAGNOSIS — R4182 Altered mental status, unspecified: Secondary | ICD-10-CM | POA: Diagnosis not present

## 2018-01-29 DIAGNOSIS — M8000XD Age-related osteoporosis with current pathological fracture, unspecified site, subsequent encounter for fracture with routine healing: Secondary | ICD-10-CM | POA: Diagnosis not present

## 2018-01-29 DIAGNOSIS — J449 Chronic obstructive pulmonary disease, unspecified: Secondary | ICD-10-CM | POA: Diagnosis not present

## 2018-01-29 DIAGNOSIS — S72002A Fracture of unspecified part of neck of left femur, initial encounter for closed fracture: Secondary | ICD-10-CM | POA: Diagnosis not present

## 2018-01-29 DIAGNOSIS — Z01818 Encounter for other preprocedural examination: Secondary | ICD-10-CM | POA: Diagnosis not present

## 2018-01-29 DIAGNOSIS — M6281 Muscle weakness (generalized): Secondary | ICD-10-CM | POA: Diagnosis not present

## 2018-01-29 DIAGNOSIS — H409 Unspecified glaucoma: Secondary | ICD-10-CM | POA: Diagnosis not present

## 2018-01-29 DIAGNOSIS — Z7984 Long term (current) use of oral hypoglycemic drugs: Secondary | ICD-10-CM | POA: Diagnosis not present

## 2018-01-29 DIAGNOSIS — K219 Gastro-esophageal reflux disease without esophagitis: Secondary | ICD-10-CM | POA: Diagnosis not present

## 2018-01-29 LAB — GLUCOSE, CAPILLARY
Glucose-Capillary: 173 mg/dL — ABNORMAL HIGH (ref 70–99)
Glucose-Capillary: 163 mg/dL — ABNORMAL HIGH (ref 70–99)

## 2018-01-29 MED ORDER — AMIODARONE HCL 100 MG PO TABS: 100.0000 mg | ORAL_TABLET | Freq: Every day | ORAL | Status: DC

## 2018-01-29 MED ORDER — LORAZEPAM 0.5 MG PO TABS: 0.2500 mg | ORAL_TABLET | Freq: Two times a day (BID) | ORAL | 1 refills | Status: DC | PRN

## 2018-01-29 MED ORDER — OXYCODONE HCL 5 MG PO TABS: 2.5000 mg | ORAL_TABLET | Freq: Four times a day (QID) | ORAL | 0 refills | Status: DC | PRN

## 2018-01-29 MED ORDER — SENNOSIDES-DOCUSATE SODIUM 8.6-50 MG PO TABS: 1.0000 | ORAL_TABLET | Freq: Every evening | ORAL | 0 refills | Status: DC | PRN

## 2018-01-29 NOTE — Care Management Important Message (Signed)
Copy of signed IM left with patient and son in room.

## 2018-01-29 NOTE — Progress Notes (Signed)
   Subjective: 2 Days Post-Op Procedure(s) (LRB): INTRAMEDULLARY (IM) NAIL INTERTROCHANTRIC ( TFNA ) (Left) Patient reports pain as moderate.  Having increased pain today where she did not have this yesterday. Patient is well, and has had no acute complaints or problems Patient was able to sit in chair yesterday with therapy assistance Plan is to go Rehab after hospital stay. no nausea and no vomiting Patient denies any chest pains or shortness of breath. Objective: Vital signs in last 24 hours: Temp:  [98.3 F (36.8 C)] 98.3 F (36.8 C) (10/17 1654) Pulse Rate:  [63] 63 (10/17 1654) BP: (104-109)/(61-65) 109/61 (10/17 1654) SpO2:  [98 %] 98 % (10/17 1654) well approximated incision Heels are non tender and elevated off the bed using rolled towels Intake/Output from previous day: 10/17 0701 - 10/18 0700 In: 170 [P.O.:170] Out: -  Intake/Output this shift: No intake/output data recorded.  Recent Labs    01/27/18 0426 01/28/18 0829  HGB 9.4* 9.5*   Recent Labs    01/28/18 0829  WBC 7.8  RBC 3.28*  HCT 31.8*  PLT 292   No results for input(s): NA, K, CL, CO2, BUN, CREATININE, GLUCOSE, CALCIUM in the last 72 hours. No results for input(s): LABPT, INR in the last 72 hours.  EXAM General - Patient is Alert, Appropriate and Oriented Extremity - Neurologically intact Neurovascular intact Sensation intact distally Intact pulses distally Dorsiflexion/Plantar flexion intact No cellulitis present Compartment soft Dressing - dressing C/D/I Motor Function - intact, moving foot and toes well on exam.    Past Medical History:  Diagnosis Date  . A-fib ([place])   . Arthritis    Osteoarthritis  . Asthma   . Chronic kidney disease    UTI  . COPD (chronic obstructive pulmonary disease) ([place])   . Coronary artery disease   . Diabetes mellitus without complication ([place])   . GERD (gastroesophageal reflux disease)   . Glaucoma (increased eye pressure)   . HOH (hard of hearing)     Left Hearing Aid  . Hypertension   . Hyperthyroidism   . On home oxygen therapy    uses at night  . Presence of permanent cardiac pacemaker   . Sleep apnea    No C-PAP    Assessment/Plan: 2 Days Post-Op Procedure(s) (LRB): INTRAMEDULLARY (IM) NAIL INTERTROCHANTRIC ( TFNA ) (Left) Active Problems:   Hip fracture ([facility])  Estimated body mass index is 21.32 kg/m as calculated from the following:   Height as of this encounter: 4\' 10"  (1.473 m).   Weight as of this encounter: 46.3 kg. Up with therapy Discharge to SNF when medically cleared  Labs: None DVT Prophylaxis - Lovenox, Foot Pumps and TED hose Weight-Bearing as tolerated to left leg Patient will need to follow-up in [facility] in 2 weeks for staple removal Recommend Lovenox 30 mg daily for 14 days after discharge No showering until the staples are removed Follow-up Dr. Marry Guan in 6 weeks  [name]. [facility] 01/29/2018, 8:05 AM

## 2018-01-29 NOTE — Plan of Care (Signed)

## 2018-01-29 NOTE — Discharge Summary (Signed)
[facility] [place] NAME: Sarah Dougherty    MR#:  [id number]  DATE OF BIRTH:  March 27, 1922  DATE OF ADMISSION:  01/24/2018 ADMITTING PHYSICIAN: Loletha Grayer, MD  DATE OF DISCHARGE: 01/29/2018  PRIMARY CARE PHYSICIAN: Perrin Maltese, MD    ADMISSION DIAGNOSIS:  Current use of long term anticoagulation [Z79.01] Closed displaced subtrochanteric fracture of left femur, initial encounter ([place]) [S72.22XA] Traumatic hematoma of ear, left, initial encounter [S00.432A]  DISCHARGE DIAGNOSIS:  Active Problems:   Hip fracture ([place])   SECONDARY DIAGNOSIS:   Past Medical History:  Diagnosis Date  . A-fib ([place])   . Arthritis    Osteoarthritis  . Asthma   . Chronic kidney disease    UTI  . COPD (chronic obstructive pulmonary disease) ([place])   . Coronary artery disease   . Diabetes mellitus without complication ([place])   . GERD (gastroesophageal reflux disease)   . Glaucoma (increased eye pressure)   . HOH (hard of hearing)    Left Hearing Aid  . Hypertension   . Hyperthyroidism   . On home oxygen therapy    uses at night  . Presence of permanent cardiac pacemaker   . Sleep apnea    No C-PAP    HOSPITAL COURSE:   1.  Left hip fracture requiring operative repair.  Pain control with half tablet of oxycodone as needed pain.  Conversion to Tylenol as quickly as possible.  Since the patient takes Eliquis it took a while to get the Eliquis out of the system and anesthesia to feel comfortable to do a spinal anesthesia.  Mortality is high within the first year of hip fracture and this was explained to the son. 2.  History of orthostatic hypotension I held Norvasc at this time 3.  Chronic atrial fibrillation.  Son interested in going back on low-dose Eliquis for stroke prevention.  Continue low-dose amiodarone. 4.  Type 2 diabetes mellitus.  Patient was on sliding scale here.  Can go back on her Amaryl as outpatient.  Can check fingersticks  daily 5.  Hyperlipidemia unspecified we will stop the Lipitor at this time 6.  GERD on PPI 7.  Glaucoma on eyedrops 8.  Left ear hematoma with fall 9.  Patient was on oxygen during the hospital course but initial pulse ox now is 95% on room air I would leave oxygen as needed for pulse ox less than 88%. 10.  Anemia.  Continue to watch hemoglobin as outpatient. 11.  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  DISCHARGE CONDITIONS:   Fair  CONSULTS OBTAINED:  Treatment Team:  Dereck Leep, MD  DRUG ALLERGIES:   Allergies  Allergen Reactions  . Amoxicillin Other (See Comments)    unknown  . Cephalexin Other (See Comments)    unknown  . Nitrofurantoin Other (See Comments)     Unknown  . Penicillins Other (See Comments)    Other reaction(s): Unknown  . Sulfa Antibiotics Hives and Itching  . Atorvastatin     Other reaction(s): Muscle Pain  . Azithromycin Rash    DISCHARGE MEDICATIONS:   Allergies as of 01/29/2018      Reactions   Amoxicillin Other (See Comments)   unknown   Cephalexin Other (See Comments)   unknown   Nitrofurantoin Other (See Comments)    Unknown   Penicillins Other (See Comments)   Other reaction(s): Unknown   Sulfa Antibiotics Hives, Itching   Atorvastatin    Other reaction(s):  Muscle Pain   Azithromycin Rash      Medication List    STOP taking these medications   amLODipine 5 MG tablet Commonly known as:  NORVASC   atorvastatin 20 MG tablet Commonly known as:  LIPITOR     TAKE these medications   amiodarone 100 MG tablet Commonly known as:  PACERONE Take 1 tablet (100 mg total) by mouth daily. What changed:  medication strength   brimonidine 0.2 % ophthalmic solution Commonly known as:  ALPHAGAN Place 1 drop into the left eye 2 (two) times daily.   clotrimazole 1 % vaginal cream Commonly known as:  GYNE-LOTRIMIN Place 1 application vaginally daily as needed. For yeast infection.   ELIQUIS 2.5 MG Tabs  tablet Generic drug:  apixaban Take 2.5 mg by mouth 2 (two) times daily.   glimepiride 2 MG tablet Commonly known as:  AMARYL Take 2 mg by mouth daily.   LORazepam 0.5 MG tablet Commonly known as:  ATIVAN Take 0.5 tablets (0.25 mg total) by mouth 2 (two) times daily as needed.   oxyCODONE 5 MG immediate release tablet Commonly known as:  Oxy IR/ROXICODONE Take 0.5 tablets (2.5 mg total) by mouth every 6 (six) hours as needed for moderate pain or severe pain.   pantoprazole 40 MG tablet Commonly known as:  PROTONIX Take 40 mg by mouth daily.   PROAIR HFA 108 (90 Base) MCG/ACT inhaler Generic drug:  albuterol Inhale 1 puff into the lungs every 6 (six) hours as needed for wheezing or shortness of breath.   senna-docusate 8.6-50 MG tablet Commonly known as:  Senokot-S Take 1 tablet by mouth at bedtime as needed for mild constipation.   sodium chloride 0.65 % nasal spray Commonly known as:  OCEAN Place 2 sprays into the nose daily as needed.   travoprost (benzalkonium) 0.004 % ophthalmic solution Commonly known as:  TRAVATAN Place 1 drop into the left eye at bedtime.   TYLENOL 8 HOUR 650 MG CR tablet Generic drug:  acetaminophen Take 650 mg by mouth every 8 (eight) hours as needed for pain.   Vitamin D 2000 units tablet Take 2,000 Units by mouth daily.        DISCHARGE INSTRUCTIONS:   Follow-up with team at rehab 1 day  If you experience worsening of your admission symptoms, develop shortness of breath, life threatening emergency, suicidal or homicidal thoughts you must seek medical attention immediately by calling 911 or calling your MD immediately  if symptoms less severe.  You Must read complete instructions/literature along with all the possible adverse reactions/side effects for all the Medicines you take and that have been prescribed to you. Take any new Medicines after you have completely understood and accept all the possible adverse reactions/side effects.    Please note  You were cared for by a hospitalist during your hospital stay. If you have any questions about your discharge medications or the care you received while you were in the hospital after you are discharged, you can call the unit and asked to speak with the hospitalist on call if the hospitalist that took care of you is not available. Once you are discharged, your primary care physician will handle any further medical issues. Please note that NO REFILLS for any discharge medications will be authorized once you are discharged, as it is imperative that you return to your primary care physician (or establish a relationship with a primary care physician if you do not have one) for your aftercare needs so that  they can reassess your need for medications and monitor your lab values.    Today   CHIEF COMPLAINT:   Chief Complaint  Patient presents with  . Fall    HISTORY OF PRESENT ILLNESS:  Martika Egler  is a 84 y.o. female presented after a fall and found to have a fractured hip   VITAL SIGNS:  Blood pressure 130/69, pulse (!) 59, temperature 98.3 F (36.8 C), temperature source Oral, resp. rate 16, height 4\' 10"  (1.473 m), weight 46.3 kg, SpO2 95 %.    PHYSICAL EXAMINATION:  GENERAL:  84 y.o.-year-old patient lying in the bed with no acute distress.  EYES: no scleral icterus.  Right eye clouded over HEENT: Head atraumatic, normocephalic. Oropharynx and nasopharynx clear.  NECK:  Supple, no jugular venous distention. No thyroid enlargement, no tenderness.  LUNGS: Normal breath sounds bilaterally, no wheezing, rales,rhonchi or crepitation. No use of accessory muscles of respiration.  CARDIOVASCULAR: S1, S2 normal. No murmurs, rubs, or gallops.  ABDOMEN: Soft, non-tender, non-distended. Bowel sounds present. No organomegaly or mass.  EXTREMITIES: No pedal edema, cyanosis, or clubbing.  NEUROLOGIC: Cranial nerves II through XII are intact. Muscle strength 5/5 in all  extremities. Sensation intact. Gait not checked.  PSYCHIATRIC: The patient is alert and answers some questions.  SKIN: No obvious rash, lesion, or ulcer.   DATA REVIEW:   CBC Recent Labs  Lab 01/28/18 0829  WBC 7.8  HGB 9.5*  HCT 31.8*  PLT 292    Chemistries  Recent Labs  Lab 01/24/18 0602 01/25/18 0257  NA 141 143  K 3.8 3.9  CL 106 111  CO2 23 24  GLUCOSE 194* 168*  BUN 22 25*  CREATININE 0.79 0.76  CALCIUM 9.0 8.2*  AST 18  --   ALT 13  --   ALKPHOS 68  --   BILITOT 1.4*  --     Microbiology Results  Results for orders placed or performed during the hospital encounter of 01/24/18  Urine culture     Status: None   Collection Time: 01/24/18  6:02 AM  Result Value Ref Range Status   Specimen Description   Final    URINE, CATHETERIZED Performed at [facility], [address], [place], [place] [address]    Special Requests   Final    NONE Performed at [facility], [address] [place], [place] [address]    Culture   Final    NO GROWTH Performed at [facility], [place] [address], [place], [place] [address]    Report Status 01/25/2018 FINAL  Final  Surgical pcr screen     Status: Abnormal   Collection Time: 01/24/18  7:09 PM  Result Value Ref Range Status   MRSA, PCR NEGATIVE NEGATIVE Final   Staphylococcus aureus POSITIVE (A) NEGATIVE Final    Comment: (NOTE) The Xpert SA Assay (FDA approved for NASAL specimens in patients 46 years of age and older), is one component of a comprehensive surveillance program. It is not intended to diagnose infection nor to guide or monitor treatment. Performed at [facility], [address] [place], [place] [address]     RADIOLOGY:  Dg Hip Operative Unilat W Or W/o Pelvis Left  Result Date: 01/27/2018 CLINICAL DATA:  Hip fracture EXAM: OPERATIVE left HIP (WITH PELVIS IF PERFORMED) 4 VIEWS TECHNIQUE: Fluoroscopic spot image(s) were submitted for interpretation  post-operatively. COMPARISON:  CT 01/24/2018 FINDINGS: Four low resolution intraoperative spot views of the left hip. Total fluoroscopy  time was 33 seconds. The images demonstrate intramedullary rod and distal screw fixation of the left femur for comminuted fracture IMPRESSION: Intraoperative fluoroscopic assistance provided during surgical fixation of proximal left femur fracture Electronically Signed   By: Donavan Foil M.D.   On: 01/27/2018 21:44     Management plans discussed with the patient, family and they are in agreement.  CODE STATUS:     Code Status Orders  (From admission, onward)         Start     Ordered   01/24/18 0758  Full code  Continuous     01/24/18 0758        Code Status History    Date Active Date Inactive Code Status Order ID Comments User Context   01/05/2018 1537 01/08/2018 1714 Full Code [id number]  Loletha Grayer, MD ED   04/03/2017 2205 04/09/2017 2050 Full Code [id number]  Salary, Avel Peace, MD Inpatient   12/10/2016 1327 12/12/2016 1517 Full Code [id number]  Clyde Canterbury, MD Inpatient   04/07/2016 2108 04/10/2016 1706 Full Code [id number]  [place] Basta, MD Inpatient   12/12/2014 1452 12/13/2014 1828 Full Code [id number]  Isaias Cowman, MD Inpatient   12/09/2014 2303 12/12/2014 1452 Full Code [id number]  B[place]ter Hire, MD Inpatient    Advance Directive Documentation     Most Recent Value  Type of Advance Directive  Healthcare Power of Attorney, Living will  Pre-existing out of facility DNR order (yellow form or pink MOST form)  -  "MOST" Form in Place?  -      TOTAL TIME TAKING CARE OF THIS PATIENT: 32 minutes.    Loletha Grayer M.D on 01/29/2018 at 11:11 AM  Between 7am to 6pm - Pager - [phone]  After 6pm go to [email or web address] - password [email or web address] [facility]  [phone]  CC: Primary care physician; Perrin Maltese, MD

## 2018-01-29 NOTE — Progress Notes (Signed)
Patient is medically stable for D/C to [facility] today. [facility] SNF authorization has been received. Per [facility] admissions coordinator at [facility] patient can come today to room 216. RN will call report at [phone] and arrange EMS for transport. Clinical Education officer, museum (CSW) sent D/C orders to [facility] via [employer]. Patient's son [name] is at bedside and aware of above. Please reconsult if future social work needs arise. CSW signing off.   [employer], LCSW [phone]

## 2018-01-29 NOTE — Clinical Social Work Placement (Signed)
   CLINICAL SOCIAL WORK PLACEMENT  NOTE  Date:  01/29/2018  Patient Details  Name: Sarah Dougherty MRN: [id number] Date of Birth: 1921/10/18  Clinical Social Work is seeking post-discharge placement for this patient at the [place] level of care (*CSW will initial, date and re-position this form in  chart as items are completed):  Yes   Patient/family provided with [facility] Work Department's list of facilities offering this level of care within the geographic area requested by the patient (or if unable, by the patient's family).  Yes   Patient/family informed of their freedom to choose among providers that offer the needed level of care, that participate in Medicare, Medicaid or managed care program needed by the patient, have an available bed and are willing to accept the patient.  Yes   Patient/family informed of [facility]'s ownership interest in [facility] and [facility], as well as of the fact that they are under no obligation to receive care at these facilities.  PASRR submitted to EDS on       PASRR number received on       Existing PASRR number confirmed on 01/25/18     FL2 transmitted to all facilities in geographic area requested by pt/family on 01/25/18     FL2 transmitted to all facilities within larger geographic area on       Patient informed that his/her managed care company has contracts with or will negotiate with certain facilities, including the following:        Yes   Patient/family informed of bed offers received.  Patient chooses bed at [facility] )     Physician recommends and patient chooses bed at      Patient to be transferred to [facility] ) on 01/29/18.  Patient to be transferred to facility by [facility] )     Patient family notified on 01/29/18 of transfer.  Name of family member notified:  (Patient's son [name] is at bedside and aware of D/C today. )     PHYSICIAN        Additional Comment:    _______________________________________________ Bayden Gil, Veronia Beets, LCSW 01/29/2018, 11:43 AM

## 2018-01-29 NOTE — Progress Notes (Signed)
Pt ready for transfer to [facility] place. Report called to Nurse, Colletta Maryland at [place]. Pt will transfer via EMS. They have been contacted for pickup. Family at bedside.

## 2018-01-29 NOTE — Progress Notes (Signed)
Physical Therapy Treatment Patient Details Name: Sarah Dougherty MRN: [id number] DOB: September 24, 1921 Today's Date: 01/29/2018    History of Present Illness Pt is a 84 y.o. female with a known history of orthostatic hypotension presented after a fall.  The patient stated that she went to bed and she was getting up to change and probably got up too quickly and thinks she passed out.  Pt found to have a L hip fracture.  Pt is now s/p open reduction and internal fixation of a left intertrochanteric femur fracture.  Assessment also includes: chronic A-fib, DM II, HLD, GERD, COPD on home O2, CKD, and CAD.       PT Comments    Pt able to ambulate a few feet forward with RW (min to mod assist) and then a few feet backwards with RW (mod to max assist).  Increased assist required taking steps backwards with RW d/t pt c/o dizziness (pt requesting to lay down right away in bed and then pt reporting dizziness resolved).  Pt appearing with antalgic gait with ambulation trial but pt reporting no pain beginning and end of session at rest.  Will continue to progress pt with strengthening, balance, and progressive functional mobility per pt tolerance.    Follow Up Recommendations  SNF     Equipment Recommendations  Rolling walker with 5" wheels(youth sized)    Recommendations for Other Services       Precautions / Restrictions Precautions Precautions: Fall Restrictions Weight Bearing Restrictions: Yes LLE Weight Bearing: Weight bearing as tolerated    Mobility  Bed Mobility Overal bed mobility: Needs Assistance Bed Mobility: Supine to Sit;Sit to Supine     Supine to sit: Mod assist Sit to supine: Mod assist   General bed mobility comments: Assist for B LE's and trunk semi-supine to/from sit; vc's for technique  Transfers Overall transfer level: Needs assistance Equipment used: Rolling walker (2 wheeled) Transfers: Sit to/from Stand Sit to Stand: Mod assist         General transfer  comment: vc's for UE and LE placement and walker use; assist to initiate stand  Ambulation/Gait Ambulation/Gait assistance: Min assist;Mod assist;Max assist Gait Distance (Feet): (3 feet forward and 3 feet backwards) Assistive device: Rolling walker (2 wheeled) Gait Pattern/deviations: Step-to pattern;Antalgic Gait velocity: decreased   General Gait Details: decreased stance time L LE; decreased B LE step length/foot clearance/heelstrike; min to mod assist to steady ambulating a few feet forwards but mod to max assist to walk a few feet backwards d/t pt c/o dizziness   Stairs             Wheelchair Mobility    Modified Rankin (Stroke Patients Only)       Balance Overall balance assessment: Needs assistance Sitting-balance support: Bilateral upper extremity supported Sitting balance-Leahy Scale: Poor Sitting balance - Comments: requires B UE support for static sitting balance   Standing balance support: Bilateral upper extremity supported Standing balance-Leahy Scale: Poor Standing balance comment: requires B UE support on walker for static standing balance                            Cognition Arousal/Alertness: Awake/alert Behavior During Therapy: WFL for tasks assessed/performed Overall Cognitive Status: Within Functional Limits for tasks assessed  Exercises      General Comments General comments (skin integrity, edema, etc.): L hip incision dressings in place.  Pt agreeable to PT session.  Family present and encouraging pt to participate.      Pertinent Vitals/Pain Pain Score: 0-No pain Pain Location: L hip Pain Intervention(s): Limited activity within patient's tolerance;Monitored during session;Premedicated before session;Repositioned  Vitals (HR and O2 on room air) stable and WFL throughout treatment session.    Home Living                      Prior Function            PT  Goals (current goals can now be found in the care plan section) Acute Rehab PT Goals Patient Stated Goal: to improve mobility PT Goal Formulation: With patient Time For Goal Achievement: 02/10/18 Potential to Achieve Goals: Fair Progress towards PT goals: Progressing toward goals    Frequency    BID      PT Plan Current plan remains appropriate    Co-evaluation              AM-PAC PT "6 Clicks" Daily Activity  Outcome Measure  Difficulty turning over in bed (including adjusting bedclothes, sheets and blankets)?: Unable Difficulty moving from lying on back to sitting on the side of the bed? : Unable Difficulty sitting down on and standing up from a chair with arms (e.g., wheelchair, bedside commode, etc,.)?: Unable Help needed moving to and from a bed to chair (including a wheelchair)?: A Lot Help needed walking in hospital room?: Total Help needed climbing 3-5 steps with a railing? : Total 6 Click Score: 7    End of Session Equipment Utilized During Treatment: Gait belt Activity Tolerance: Patient limited by pain;Other (comment)(Limited d/t c/o dizziness with standing) Patient left: in bed;with call bell/phone within reach;with bed alarm set;with family/visitor present;with SCD's reapplied;Other (comment)(B heels elevated via pillow) Nurse Communication: Mobility status PT Visit Diagnosis: Unsteadiness on feet (R26.81);Muscle weakness (generalized) (M62.81);Other abnormalities of gait and mobility (R26.89);Pain Pain - Right/Left: Left Pain - part of body: Hip     Time: 2297-9892 PT Time Calculation (min) (ACUTE ONLY): 31 min  Charges:  $Therapeutic Activity: 23-37 mins                    Leitha Bleak, PT 01/29/18, 2:19 PM [phone]

## 2018-02-01 DIAGNOSIS — E1122 Type 2 diabetes mellitus with diabetic chronic kidney disease: Secondary | ICD-10-CM | POA: Diagnosis not present

## 2018-02-01 DIAGNOSIS — N183 Chronic kidney disease, stage 3 (moderate): Secondary | ICD-10-CM | POA: Diagnosis not present

## 2018-02-01 DIAGNOSIS — M8000XD Age-related osteoporosis with current pathological fracture, unspecified site, subsequent encounter for fracture with routine healing: Secondary | ICD-10-CM | POA: Diagnosis not present

## 2018-02-01 DIAGNOSIS — J449 Chronic obstructive pulmonary disease, unspecified: Secondary | ICD-10-CM | POA: Diagnosis not present

## 2018-02-01 DIAGNOSIS — I10 Essential (primary) hypertension: Secondary | ICD-10-CM | POA: Diagnosis not present

## 2018-02-09 ENCOUNTER — Encounter: Payer: Self-pay | Admitting: Adult Health

## 2018-02-09 ENCOUNTER — Other Ambulatory Visit: Payer: Self-pay | Admitting: Adult Health

## 2018-02-09 ENCOUNTER — Non-Acute Institutional Stay (SKILLED_NURSING_FACILITY): Payer: PPO | Admitting: Adult Health

## 2018-02-09 DIAGNOSIS — S72002S Fracture of unspecified part of neck of left femur, sequela: Secondary | ICD-10-CM | POA: Diagnosis not present

## 2018-02-09 DIAGNOSIS — I10 Essential (primary) hypertension: Secondary | ICD-10-CM | POA: Diagnosis not present

## 2018-02-09 DIAGNOSIS — J449 Chronic obstructive pulmonary disease, unspecified: Secondary | ICD-10-CM

## 2018-02-09 MED ORDER — GLIMEPIRIDE 2 MG PO TABS: 2.0000 mg | ORAL_TABLET | Freq: Every day | ORAL | 0 refills | Status: AC

## 2018-02-09 MED ORDER — TRAVOPROST 0.004 % OP SOLN: 1.0000 [drp] | Freq: Every day | OPHTHALMIC | 0 refills | Status: AC

## 2018-02-09 MED ORDER — PANTOPRAZOLE SODIUM 40 MG PO TBEC: 40.0000 mg | DELAYED_RELEASE_TABLET | Freq: Every day | ORAL | 0 refills | Status: DC

## 2018-02-09 MED ORDER — ALBUTEROL SULFATE HFA 108 (90 BASE) MCG/ACT IN AERS: 1.0000 | INHALATION_SPRAY | Freq: Four times a day (QID) | RESPIRATORY_TRACT | 0 refills | Status: AC | PRN

## 2018-02-09 MED ORDER — AMIODARONE HCL 100 MG PO TABS: 100.0000 mg | ORAL_TABLET | Freq: Every day | ORAL | Status: AC

## 2018-02-09 MED ORDER — OXYCODONE HCL 5 MG PO TABS: 5.0000 mg | ORAL_TABLET | ORAL | 0 refills | Status: DC | PRN

## 2018-02-09 MED ORDER — CLOTRIMAZOLE 1 % VA CREA: 1.0000 | TOPICAL_CREAM | Freq: Every day | VAGINAL | 0 refills | Status: DC | PRN

## 2018-02-09 MED ORDER — BRIMONIDINE TARTRATE 0.2 % OP SOLN: 1.0000 [drp] | Freq: Two times a day (BID) | OPHTHALMIC | 0 refills | Status: AC

## 2018-02-09 MED ORDER — APIXABAN 2.5 MG PO TABS: 2.5000 mg | ORAL_TABLET | Freq: Two times a day (BID) | ORAL | 0 refills | Status: AC

## 2018-02-09 NOTE — Progress Notes (Signed)
[facility]   [facility] Room Number: 216 A Place of Service:  SNF (31)    CODE STATUS: DNR  Allergies  Allergen Reactions  . Amoxicillin Other (See Comments)    unknown  . Cephalexin Other (See Comments)    unknown  . Nitrofurantoin Other (See Comments)     Unknown  . Penicillins Other (See Comments)    Other reaction(s): Unknown  . Sulfa Antibiotics Hives and Itching  . Atorvastatin     Other reaction(s): Muscle Pain  . Azithromycin Rash    Chief Complaint  Patient presents with  . Discharge Note    Discharging to home 02/09/18    HPI:  She is being discharged to home with home health for pt/ot. She will not need any dme. She will need her prescriptions written and will need to follow up with her medical provider. She had been hospitalized for a left femur fracture. She was admitted to this facility for short term rehab and is ready for discharge to home.   Past Medical History:  Diagnosis Date  . A-fib ([place])   . Arthritis    Osteoarthritis  . Asthma   . Chronic kidney disease    UTI  . COPD (chronic obstructive pulmonary disease) ([place])   . Coronary artery disease   . Diabetes mellitus without complication ([place])   . GERD (gastroesophageal reflux disease)   . Glaucoma (increased eye pressure)   . HOH (hard of hearing)    Left Hearing Aid  . Hypertension   . Hyperthyroidism   . On home oxygen therapy    uses at night  . Presence of permanent cardiac pacemaker   . Sleep apnea    No C-PAP    Past Surgical History:  Procedure Laterality Date  . ABDOMINAL HYSTERECTOMY    . ESOPHAGOGASTRODUODENOSCOPY (EGD) WITH PROPOFOL N/A 01/08/2018   Procedure: ESOPHAGOGASTRODUODENOSCOPY (EGD) WITH PROPOFOL;  Surgeon: Jonathon Bellows, MD;  Location: [facility] ENDOSCOPY;  Service: Gastroenterology;  Laterality: N/A;  . EYE SURGERY Left    Cataract Extraction with IOL  . EYE SURGERY Right    Artificial Eye  . HIP ARTHROPLASTY Right 04/07/2017   Procedure: ARTHROPLASTY BIPOLAR HIP (HEMIARTHROPLASTY);  Surgeon: Dereck Leep, MD;  Location: [facility] ORS;  Service: Orthopedics;  Laterality: Right;  . INSERT / REPLACE / REMOVE PACEMAKER    . INTRAMEDULLARY (IM) NAIL INTERTROCHANTERIC Left 01/27/2018   Procedure: INTRAMEDULLARY (IM) NAIL INTERTROCHANTRIC ( TFNA );  Surgeon: Dereck Leep, MD;  Location: [facility] ORS;  Service: Orthopedics;  Laterality: Left;  . PACEMAKER INSERTION Left 12/12/2014   Procedure: INSERTION PACEMAKER;  Surgeon: Isaias Cowman, MD;  Location: [facility] ORS;  Service: Cardiovascular;  Laterality: Left;  . PAROTIDECTOMY Left 12/10/2016   Procedure: PAROTIDECTOMY;  Surgeon: Clyde Canterbury, MD;  Location: [facility] ORS;  Service: ENT;  Laterality: Left;  . RADICAL NECK DISSECTION Left 12/10/2016   Procedure: RADICAL NECK DISSECTION;  Surgeon: Clyde Canterbury, MD;  Location: [facility] ORS;  Service: ENT;  Laterality: Left;    Social History   Socioeconomic History  . Marital status: Widowed    Spouse name: Not on file  . Number of children: Not on file  . Years of education: Not on file  . Highest education level: Not on file  Occupational History  . Occupation: retired  Scientific laboratory technician  . Financial resource strain: Not on file  . Food insecurity:    Worry: Not on file    Inability: Not on file  .  Transportation needs:    Medical: Not on file    Non-medical: Not on file  Tobacco Use  . Smoking status: Never Smoker  . Smokeless tobacco: Never Used  Substance and Sexual Activity  . Alcohol use: No  . Drug use: No  . Sexual activity: Never  Lifestyle  . Physical activity:    Days per week: Not on file    Minutes per session: Not on file  . Stress: Not on file  Relationships  . Social connections:    Talks on phone: Not on file    Gets together: Not on file    Attends religious service: Not on file    Active member of club or organization: Not on file    Attends meetings of clubs or organizations: Not on file     Relationship status: Not on file  . Intimate partner violence:    Fear of current or ex partner: Not on file    Emotionally abused: Not on file    Physically abused: Not on file    Forced sexual activity: Not on file  Other Topics Concern  . Not on file  Social History Narrative  . Not on file   Family History  Problem Relation Age of Onset  . Diabetes Brother   . Diabetes Son   . Stroke Mother   . Cancer Father   . Stroke Sister   . Stroke Sister   . COPD Brother   . Diabetes Son     VITAL SIGNS BP (!) 150/69   Pulse 73   Temp 98.6 F (37 C)   Resp 20   Ht 4\' 10"  (1.473 m)   Wt 102 lb 11.2 oz (46.6 kg)   SpO2 94%   BMI 21.46 kg/m   Patient's Medications  New Prescriptions   No medications on file  Previous Medications   ACETAMINOPHEN (TYLENOL 8 HOUR) 650 MG CR TABLET    Take 650 mg by mouth every 8 (eight) hours as needed for pain.   ALBUTEROL (PROAIR HFA) 108 (90 BASE) MCG/ACT INHALER    Inhale 1 puff into the lungs every 6 (six) hours as needed for wheezing or shortness of breath.   AMIODARONE (PACERONE) 100 MG TABLET    Take 1 tablet (100 mg total) by mouth daily.   APIXABAN (ELIQUIS) 2.5 MG TABS TABLET    Take 2.5 mg by mouth 2 (two) times daily.    BISACODYL (BISAC-EVAC) 10 MG SUPPOSITORY    Place 10 mg rectally daily as needed for moderate constipation.   BRIMONIDINE (ALPHAGAN) 0.2 % OPHTHALMIC SOLUTION    Place 1 drop into the left eye 2 (two) times daily.   CHOLECALCIFEROL (VITAMIN D) 2000 UNITS TABLET    Take 2,000 Units by mouth daily.   CLOTRIMAZOLE (GYNE-LOTRIMIN) 1 % VAGINAL CREAM    Place 1 application vaginally daily as needed. For yeast infection.   GLIMEPIRIDE (AMARYL) 2 MG TABLET    Take 2 mg by mouth daily.   INFANT CARE PRODUCTS (DERMACLOUD) CREA    Apply liberal amount to area of skin irritation every shift.  Okay to leave at bedside   NON FORMULARY    Diet Type: Mechanical soft ground meats   OXYCODONE (ROXICODONE) 5 MG IMMEDIATE RELEASE TABLET     Take 5 mg by mouth every 4 (four) hours as needed for severe pain.   PANTOPRAZOLE (PROTONIX) 40 MG TABLET    Take 40 mg by mouth daily.   SENNA-DOCUSATE (SENOKOT-S) 8.6-50  MG TABLET    Take 1 tablet by mouth at bedtime as needed for mild constipation.   SODIUM CHLORIDE (OCEAN) 0.65 % NASAL SPRAY    Place 2 sprays into the nose daily as needed.   TRAVOPROST, BENZALKONIUM, (TRAVATAN) 0.004 % OPHTHALMIC SOLUTION    Place 1 drop into the left eye at bedtime.  Modified Medications   No medications on file  Discontinued Medications   LORAZEPAM (ATIVAN) 0.5 MG TABLET    Take 0.5 tablets (0.25 mg total) by mouth 2 (two) times daily as needed.   OXYCODONE (OXY IR/ROXICODONE) 5 MG IMMEDIATE RELEASE TABLET    Take 0.5 tablets (2.5 mg total) by mouth every 6 (six) hours as needed for moderate pain or severe pain.     SIGNIFICANT DIAGNOSTIC EXAMS   LABS REVIEWED:   01-28-18: wbc 7.8; hgb 9.5; hct 31.8; mcv 97.0;plt 292  Review of Systems  Constitutional: Negative for malaise/fatigue.  Respiratory: Negative for cough and shortness of breath.   Cardiovascular: Negative for chest pain, palpitations and leg swelling.  Gastrointestinal: Negative for abdominal pain, constipation and heartburn.  Musculoskeletal: Negative for back pain, joint pain and myalgias.  Skin: Negative.   Neurological: Negative for dizziness.  Psychiatric/Behavioral: The patient is not nervous/anxious.     Physical Exam  Constitutional: She is oriented to person, place, and time. She appears well-developed and well-nourished. No distress.  Neck: No thyromegaly present.  Cardiovascular: Normal rate, regular rhythm, normal heart sounds and intact distal pulses.  Pulmonary/Chest: Effort normal and breath sounds normal. No respiratory distress.  Abdominal: Soft. Bowel sounds are normal. She exhibits no distension. There is no tenderness.  Musculoskeletal: She exhibits no edema.  Is able to move all extremities Status post  left femur fracture  Lymphadenopathy:    She has no cervical adenopathy.  Neurological: She is alert and oriented to person, place, and time.  Skin: Skin is warm and dry. She is not diaphoretic.  Psychiatric: She has a normal mood and affect.      ASSESSMENT/ PLAN:   Patient is being discharged with the following home health services:  Pt/ot: to evaluate and treat as indicated for gait balance strength adl training   Patient is being discharged with the following durable medical equipment:  None needed   Patient has been advised to f/u with their PCP in 1-2 weeks to bring them up to date on their rehab stay.  Social services at facility was responsible for arranging this appointment.  Pt was provided with a 30 day supply of prescriptions for medications and refills must be obtained from their PCP.  For controlled substances, a more limited supply may be provided adequate until PCP appointment only.   A 30 day supply of her prescription medications with #30 oxycodone 5 mg tabs to [facility] on [address].   Time spent with patient: 35 minutes: discussed medications; dme needs and home health needs; verbalized understanding.   Ok Edwards NP [facility] Adult Medicine  Contact [phone] Monday through Friday 8am- 5pm  After hours call [phone]

## 2018-02-11 ENCOUNTER — Encounter: Payer: Self-pay | Admitting: Adult Health

## 2018-02-11 ENCOUNTER — Other Ambulatory Visit: Payer: Self-pay

## 2018-02-11 DIAGNOSIS — Z87440 Personal history of urinary (tract) infections: Secondary | ICD-10-CM | POA: Diagnosis not present

## 2018-02-11 DIAGNOSIS — Z9181 History of falling: Secondary | ICD-10-CM | POA: Diagnosis not present

## 2018-02-11 DIAGNOSIS — I2510 Atherosclerotic heart disease of native coronary artery without angina pectoris: Secondary | ICD-10-CM | POA: Diagnosis not present

## 2018-02-11 DIAGNOSIS — I482 Chronic atrial fibrillation, unspecified: Secondary | ICD-10-CM | POA: Diagnosis not present

## 2018-02-11 DIAGNOSIS — M810 Age-related osteoporosis without current pathological fracture: Secondary | ICD-10-CM | POA: Diagnosis not present

## 2018-02-11 DIAGNOSIS — I129 Hypertensive chronic kidney disease with stage 1 through stage 4 chronic kidney disease, or unspecified chronic kidney disease: Secondary | ICD-10-CM | POA: Diagnosis not present

## 2018-02-11 DIAGNOSIS — J449 Chronic obstructive pulmonary disease, unspecified: Secondary | ICD-10-CM | POA: Diagnosis not present

## 2018-02-11 DIAGNOSIS — Z950 Presence of cardiac pacemaker: Secondary | ICD-10-CM | POA: Diagnosis not present

## 2018-02-11 DIAGNOSIS — K219 Gastro-esophageal reflux disease without esophagitis: Secondary | ICD-10-CM | POA: Diagnosis not present

## 2018-02-11 DIAGNOSIS — M1990 Unspecified osteoarthritis, unspecified site: Secondary | ICD-10-CM | POA: Diagnosis not present

## 2018-02-11 DIAGNOSIS — E1122 Type 2 diabetes mellitus with diabetic chronic kidney disease: Secondary | ICD-10-CM | POA: Diagnosis not present

## 2018-02-11 DIAGNOSIS — Z7901 Long term (current) use of anticoagulants: Secondary | ICD-10-CM | POA: Diagnosis not present

## 2018-02-11 DIAGNOSIS — N183 Chronic kidney disease, stage 3 (moderate): Secondary | ICD-10-CM | POA: Diagnosis not present

## 2018-02-11 DIAGNOSIS — Z96641 Presence of right artificial hip joint: Secondary | ICD-10-CM | POA: Diagnosis not present

## 2018-02-11 DIAGNOSIS — D631 Anemia in chronic kidney disease: Secondary | ICD-10-CM | POA: Diagnosis not present

## 2018-02-11 DIAGNOSIS — S72012D Unspecified intracapsular fracture of left femur, subsequent encounter for closed fracture with routine healing: Secondary | ICD-10-CM | POA: Diagnosis not present

## 2018-02-11 NOTE — Patient Outreach (Signed)
[place] [facility]) Care Management  02/11/2018  Sarah Dougherty [birth year] [id number]   Referral Date: 02/11/18 Referral Source: HTA report Date of Admission: 01/29/18 Diagnosis: Left hip fracture Date of Discharge: 02/09/18 Facility:  [place]: HTA  Outreach attempt: Several tries to number listed.  Line busy each time  Plan: RN CM will attempt patient again within 4 business days and send letter.   Jone Baseman, RN, MSN [facility] Care Management Care Management Coordinator Direct Line [phone] Toll Free: [phone]  Fax: [phone]

## 2018-02-12 ENCOUNTER — Other Ambulatory Visit: Payer: Self-pay | Admitting: *Deleted

## 2018-02-12 NOTE — Patient Outreach (Signed)
[place] [facility]) Care Management  02/12/2018  AMAL RENBARGER 03/22/1922 [id number]   Transition of Care Referral on behalf of Karren Burly   Referral Date: 02/11/18 Referral Source: HTA report Date of Admission: 01/29/18 Diagnosis: Left hip fracture Date of Discharge: 02/09/18 Facility:  [place]: HTA  Outreach attempt # 2 Female answered and stated patient was not there at the time of the call  [facility] RN CM left HIPAA compliant voicemail message along with CM's contact info.   Plan: [facility] RN CM scheduled this patient for another call attempt within 4 business days  Suvi Archuletta L. Lavina Hamman, RN, BSN, [place] Management Care Coordinator Direct Number [phone] Mobile number [phone]  Main [facility] number [phone] Fax number [phone]

## 2018-02-15 ENCOUNTER — Ambulatory Visit: Payer: Self-pay

## 2018-02-15 ENCOUNTER — Other Ambulatory Visit: Payer: Self-pay

## 2018-02-15 NOTE — Patient Outreach (Signed)
[place] [facility]) Care Management [place]  02/15/2018  NYESHA CLIFF 27-Aug-1921 [id number]  Reason for referral: post discharge medication review  [facility] pharmacy case is being closed due to the following reasons:  We have been unable to establish and/or maintain contact with the patient.   Thank you for allowing [facility] pharmacy to be involved in this patient's care.    Joetta Manners, PharmD Clinical Pharmacist [place] [phone]

## 2018-02-15 NOTE — Patient Outreach (Addendum)
Sarah Dougherty [facility]) Care Management  02/15/2018  [name] November 30, 1921 [id number]   Referral Date: 02/11/18 Referral Source: HTA report Date of Admission: 01/29/18 Diagnosis: Left hip fracture Date of Discharge: 02/09/18 Facility:  [place]: HTA    Outreach attempt # 3 to patient. Spoke with patient's son-[name](DPR on file). He voices that patient is doing fairly well since return home. He acknowledges that patient is 84yrs old and her recovery will take longer than most people. Son confirmed that he has all of patient's meds in the home but did not wish to review them at this time. He states that [facility] services is coming out today to see patient. He has called surgeon office for follow up appt and they will contact him back with date and time of appt. Patient's son is taking her to appts. Caregiver denies any RN CM needs or concerns at this time. Discussed [facility] services with son. He does not feel like they need these services at this time.     Plan: RN CM will close case at this time.   Enzo Montgomery, RN,BSN,CCM [place] Management Telephonic Care Management Coordinator Direct Phone: [phone] Toll Free: [phone] Fax: [phone]

## 2018-02-16 ENCOUNTER — Other Ambulatory Visit: Payer: Self-pay | Admitting: Adult Health

## 2018-02-16 MED ORDER — OXYCODONE HCL 5 MG PO TABS: 5.0000 mg | ORAL_TABLET | ORAL | 0 refills | Status: DC | PRN

## 2018-02-19 ENCOUNTER — Ambulatory Visit: Payer: PPO | Admitting: Oncology

## 2018-02-22 DIAGNOSIS — M810 Age-related osteoporosis without current pathological fracture: Secondary | ICD-10-CM | POA: Diagnosis not present

## 2018-02-22 DIAGNOSIS — S72012D Unspecified intracapsular fracture of left femur, subsequent encounter for closed fracture with routine healing: Secondary | ICD-10-CM | POA: Diagnosis not present

## 2018-02-22 DIAGNOSIS — I482 Chronic atrial fibrillation, unspecified: Secondary | ICD-10-CM | POA: Diagnosis not present

## 2018-02-22 DIAGNOSIS — I2510 Atherosclerotic heart disease of native coronary artery without angina pectoris: Secondary | ICD-10-CM | POA: Diagnosis not present

## 2018-02-22 DIAGNOSIS — M1990 Unspecified osteoarthritis, unspecified site: Secondary | ICD-10-CM | POA: Diagnosis not present

## 2018-02-22 DIAGNOSIS — I129 Hypertensive chronic kidney disease with stage 1 through stage 4 chronic kidney disease, or unspecified chronic kidney disease: Secondary | ICD-10-CM | POA: Diagnosis not present

## 2018-02-22 DIAGNOSIS — N183 Chronic kidney disease, stage 3 (moderate): Secondary | ICD-10-CM | POA: Diagnosis not present

## 2018-02-22 DIAGNOSIS — E1122 Type 2 diabetes mellitus with diabetic chronic kidney disease: Secondary | ICD-10-CM | POA: Diagnosis not present

## 2018-02-24 ENCOUNTER — Encounter (HOSPITAL_COMMUNITY): Payer: Self-pay | Admitting: Anesthesiology

## 2018-02-24 NOTE — Anesthesia Preprocedure Evaluation (Deleted)
Anesthesia Evaluation    Reviewed: Allergy & Precautions, Patient's Chart, lab work & pertinent test results  Airway        Dental   Pulmonary asthma , sleep apnea (no CPAP) , COPD (O2 at night),  oxygen dependent,           Cardiovascular hypertension, Pt. on medications + CAD and +CHF  + dysrhythmias Atrial Fibrillation + pacemaker   TTE 2016  - Left ventricle: The cavity size was normal. Systolic function was normal. The estimated ejection fraction was in the range of 60% to 65%. Wall motion was normal; there were no regional wall motion abnormalities. Doppler parameters are consistent with abnormal left ventricular relaxation (grade 1 diastolic dysfunction). - Aortic valve: There was mild regurgitation. Valve area (Vmax): 2.5 cm^2. - Left atrium: The atrium was mildly dilated. - Atrial septum: No defect or patent foramen ovale was identified.   Neuro/Psych negative neurological ROS  negative psych ROS   GI/Hepatic Neg liver ROS, GERD  ,  Endo/Other  diabetes, Type 2Hyperthyroidism   Renal/GU negative Renal ROS  negative genitourinary   Musculoskeletal  (+) Arthritis , Osteoarthritis,    Abdominal   Peds  Hematology negative hematology ROS (+)   Anesthesia Other Findings Pancreatic cyst  On eliquis  Reproductive/Obstetrics                             Anesthesia Physical Anesthesia Plan  ASA: III  Anesthesia Plan: MAC   Post-op Pain Management:    Induction: Intravenous  PONV Risk Score and Plan: 2 and Treatment may vary due to age or medical condition and Propofol infusion  Airway Management Planned: Natural Airway  Additional Equipment:   Intra-op Plan:   Post-operative Plan:   Informed Consent: I have reviewed the patients History and Physical, chart, labs and discussed the procedure including the risks, benefits and alternatives for the proposed anesthesia with the  patient or authorized representative who has indicated his/her understanding and acceptance.   Dental advisory given  Plan Discussed with: CRNA  Anesthesia Plan Comments:         Anesthesia Quick Evaluation

## 2018-02-25 ENCOUNTER — Ambulatory Visit (HOSPITAL_COMMUNITY): Admission: RE | Admit: 2018-02-25 | Payer: PPO | Source: Ambulatory Visit | Admitting: Gastroenterology

## 2018-02-25 ENCOUNTER — Encounter (HOSPITAL_COMMUNITY): Admission: RE | Payer: Self-pay | Source: Ambulatory Visit

## 2018-02-25 ENCOUNTER — Telehealth: Payer: Self-pay | Admitting: Gastroenterology

## 2018-02-25 DIAGNOSIS — S72002A Fracture of unspecified part of neck of left femur, initial encounter for closed fracture: Secondary | ICD-10-CM | POA: Diagnosis not present

## 2018-02-25 SURGERY — UPPER ENDOSCOPIC ULTRASOUND (EUS) RADIAL
Anesthesia: Monitor Anesthesia Care

## 2018-02-25 NOTE — Progress Notes (Signed)
Patient scheduled for upper EUS with Dr. Ardis Hughs at [facility]. Pt has not shown up for appointment. Attempted to call home number at 0630 am . There was no answer and no way to leave a message.

## 2018-02-25 NOTE — Telephone Encounter (Signed)
She did not show for appt today.  Quick review of epic looks like she's had hip fracture recently.  Given her very advanced age and mounting comorbid conditions I think she needs to return to her referring MD (Dr. Vicente Males) to see if going ahead with further testing still makes sense.

## 2018-02-25 NOTE — Telephone Encounter (Signed)
Dr Vicente Males please see note from Dr Ardis Hughs regarding pt procedure.

## 2018-02-28 NOTE — Telephone Encounter (Signed)
[place]   Inform that I agree with Dr Olena Heckle- review of her chart and it seems she may be too sick for any endoscopic procedure right now. I will defer to Dr Tasia Catchings , in case further evaluation is needed at a later date we can re asses her performance status and decide.    Bailey Mech

## 2018-03-01 ENCOUNTER — Telehealth: Payer: Self-pay

## 2018-03-01 NOTE — Telephone Encounter (Signed)
Albina Billet,  I agree with both of you.  Previously family desires diagnosis despite knowing that patient may not be candidate of any treatment.  Steffanie Dunn, can you reach out to patient's family. If they want to have another meet, I am happy to discuss. Thanks.

## 2018-03-01 NOTE — Telephone Encounter (Signed)
Received call back from son, [name]. He was asking our involvement with his mother. He once again stated that he has seen Dr. Vicente Males and [facility] and was told she did not have malignancy in her body. We reviewed that when they were here for consult they wished to pursue EUS for further diagnostic reasons, even if did not change her treatment plan. He stated that he did not want to come back here because we did not know what we were doing and that we were just trying to make money off of her by doing further tests. Stated that if he wanted to have EUS performed later that he would call Dr. Ardis Hughs and arrange. I did let him now that we have cancelled the appointment with Dr. Tasia Catchings and that if he needs our assistance in the future to call and let us know.

## 2018-03-01 NOTE — Telephone Encounter (Signed)
Call placed to son, [name]. He reports his mother did break her hip and is recovering from this. I wanted to follow up with him regarding his mother's upcoming appointment with Dr. Tasia Catchings for follow up. Since she did not have EUS, if he would like, she can still be seen for follow up. As soon as I mentioned the appointment, he stated she will not be coming for this appointment because we did not know what we were doing. States Dr. Vicente Males told him it was a "small spot" and he went to [facility] and they were not impressed with this area as well. I will send a scheduling message to cancel appointment with Dr. Tasia Catchings. He was encouraged to call if he needed [place] in the future and wished her a speedy recovery with her hip.

## 2018-03-02 NOTE — Telephone Encounter (Signed)
Spoke with pt son and explained that Dr. Vicente Males wants to hold off on any endoscopic procedures given Sarah Dougherty's current condition. I explained that if further evaluation is needed at a later date , Dr. Vicente Males will reassess pt performance and decide on next steps.

## 2018-03-03 ENCOUNTER — Inpatient Hospital Stay: Payer: PPO | Admitting: Oncology

## 2018-03-04 DIAGNOSIS — S72142K Displaced intertrochanteric fracture of left femur, subsequent encounter for closed fracture with nonunion: Secondary | ICD-10-CM | POA: Diagnosis not present

## 2018-03-08 ENCOUNTER — Inpatient Hospital Stay: Admission: RE | Admit: 2018-03-08 | Payer: PPO | Source: Ambulatory Visit

## 2018-03-09 ENCOUNTER — Encounter
Admission: RE | Admit: 2018-03-09 | Discharge: 2018-03-09 | Disposition: A | Discharge status: Home / Self Care | Payer: PPO | Source: Ambulatory Visit | Attending: Orthopedic Surgery | Admitting: Orthopedic Surgery

## 2018-03-09 ENCOUNTER — Other Ambulatory Visit: Payer: Self-pay

## 2018-03-09 HISTORY — DX: Cardiac arrhythmia, unspecified: I49.9

## 2018-03-09 HISTORY — DX: Heart failure, unspecified: I50.9

## 2018-03-09 HISTORY — DX: Malignant (primary) neoplasm, unspecified: C80.1

## 2018-03-09 NOTE — Patient Instructions (Signed)
Your procedure is scheduled on:03/10/18 Report to Day Surgery. [facility] SECOND FLOOR To find out your arrival time please call [phone] between 1PM - 3PM on 03/09/18  Remember: Instructions that are not followed completely may result in serious medical risk,  up to and including death, or upon the discretion of your surgeon and anesthesiologist your  surgery may need to be rescheduled.     _X__ 1. Do not eat food after midnight the night before your procedure.                 No gum chewing or hard candies. You may drink clear liquids up to 2 hours                 before you are scheduled to arrive for your surgery- DO not drink clear                 liquids within 2 hours of the start of your surgery.                 Clear Liquids include:  water, apple juice without pulp, clear carbohydrate                 drink such as Clearfast of Gatorade, Black Coffee or Tea (Do not add                 anything to coffee or tea).  __X__2.  On the morning of surgery brush your teeth with toothpaste and water, you                may rinse your mouth with mouthwash if you wish.  Do not swallow any toothpaste of mouthwash.     _X__ 3.  No Alcohol for 24 hours before or after surgery.   _X__ 4.  Do Not Smoke or use e-cigarettes For 24 Hours Prior to Your Surgery.                 Do not use any chewable tobacco products for at least 6 hours prior to                 surgery.  ____  5.  Bring all medications with you on the day of surgery if instructed.   __X__  6.  Notify your doctor if there is any change in your medical condition      (cold, fever, infections).     Do not wear jewelry, make-up, hairpins, clips or nail polish. Do not wear lotions, powders, or perfumes. You may wear deodorant. Do not shave 48 hours prior to surgery. Men may shave face and neck. Do not bring valuables to the hospital.    [facility] is not responsible for any belongings or  valuables.  Contacts, dentures or bridgework may not be worn into surgery. Leave your suitcase in the car. After surgery it may be brought to your room. For patients admitted to the hospital, discharge time is determined by your treatment team.   Patients discharged the day of surgery will not be allowed to drive home.    __X__ Take these medicines the morning of surgery with A SIP OF WATER:    1. AMIODARONE  2. AMLODIPINE  3.   4.  5.  6.  ____ Fleet Enema (as directed)   ____ Use CHG Soap as directed  ___X_ Use inhalers on the day of surgery  AND BRING  ____ Stop metformin 2 days prior to  surgery    ____ Take 1/2 of usual insulin dose the night before surgery. No insulin the morning          of surgery.   __X__ Stop Coumadin/Plavix/aspirin on   ELIQUIS ALEADY STOPPED BY SON  ____ Stop Anti-inflammatories on    ____ Stop supplements until after surgery.    ____ Bring C-Pap to the hospital.

## 2018-03-10 ENCOUNTER — Inpatient Hospital Stay
Admission: RE | Admit: 2018-03-10 | Discharge: 2018-03-13 | DRG: 467 | Disposition: A | Discharge status: Home Health | Payer: PPO | Source: Ambulatory Visit | Attending: Orthopedic Surgery | Admitting: Orthopedic Surgery

## 2018-03-10 ENCOUNTER — Other Ambulatory Visit: Payer: Self-pay

## 2018-03-10 ENCOUNTER — Inpatient Hospital Stay: Payer: PPO

## 2018-03-10 ENCOUNTER — Encounter: Payer: Self-pay | Admitting: Orthopedic Surgery

## 2018-03-10 ENCOUNTER — Inpatient Hospital Stay: Payer: PPO | Admitting: Anesthesiology

## 2018-03-10 ENCOUNTER — Encounter
Admission: RE | Disposition: A | Discharge status: Home Health | Payer: Self-pay | Source: Ambulatory Visit | Attending: Orthopedic Surgery

## 2018-03-10 DIAGNOSIS — T84091A Other mechanical complication of internal left hip prosthesis, initial encounter: Principal | ICD-10-CM | POA: Diagnosis present

## 2018-03-10 DIAGNOSIS — G4730 Sleep apnea, unspecified: Secondary | ICD-10-CM | POA: Diagnosis not present

## 2018-03-10 DIAGNOSIS — Z882 Allergy status to sulfonamides status: Secondary | ICD-10-CM | POA: Diagnosis not present

## 2018-03-10 DIAGNOSIS — Z888 Allergy status to other drugs, medicaments and biological substances status: Secondary | ICD-10-CM

## 2018-03-10 DIAGNOSIS — Z7984 Long term (current) use of oral hypoglycemic drugs: Secondary | ICD-10-CM | POA: Diagnosis not present

## 2018-03-10 DIAGNOSIS — N183 Chronic kidney disease, stage 3 (moderate): Secondary | ICD-10-CM | POA: Diagnosis not present

## 2018-03-10 DIAGNOSIS — I482 Chronic atrial fibrillation, unspecified: Secondary | ICD-10-CM | POA: Diagnosis not present

## 2018-03-10 DIAGNOSIS — I2510 Atherosclerotic heart disease of native coronary artery without angina pectoris: Secondary | ICD-10-CM | POA: Diagnosis present

## 2018-03-10 DIAGNOSIS — K219 Gastro-esophageal reflux disease without esophagitis: Secondary | ICD-10-CM | POA: Diagnosis present

## 2018-03-10 DIAGNOSIS — Z950 Presence of cardiac pacemaker: Secondary | ICD-10-CM | POA: Diagnosis not present

## 2018-03-10 DIAGNOSIS — I130 Hypertensive heart and chronic kidney disease with heart failure and stage 1 through stage 4 chronic kidney disease, or unspecified chronic kidney disease: Secondary | ICD-10-CM | POA: Diagnosis not present

## 2018-03-10 DIAGNOSIS — Z881 Allergy status to other antibiotic agents status: Secondary | ICD-10-CM | POA: Diagnosis not present

## 2018-03-10 DIAGNOSIS — Z7901 Long term (current) use of anticoagulants: Secondary | ICD-10-CM | POA: Diagnosis not present

## 2018-03-10 DIAGNOSIS — E1122 Type 2 diabetes mellitus with diabetic chronic kidney disease: Secondary | ICD-10-CM | POA: Diagnosis not present

## 2018-03-10 DIAGNOSIS — H9190 Unspecified hearing loss, unspecified ear: Secondary | ICD-10-CM | POA: Diagnosis not present

## 2018-03-10 DIAGNOSIS — S72009A Fracture of unspecified part of neck of unspecified femur, initial encounter for closed fracture: Secondary | ICD-10-CM | POA: Diagnosis not present

## 2018-03-10 DIAGNOSIS — Z8639 Personal history of other endocrine, nutritional and metabolic disease: Secondary | ICD-10-CM

## 2018-03-10 DIAGNOSIS — H409 Unspecified glaucoma: Secondary | ICD-10-CM | POA: Diagnosis present

## 2018-03-10 DIAGNOSIS — I509 Heart failure, unspecified: Secondary | ICD-10-CM | POA: Diagnosis present

## 2018-03-10 DIAGNOSIS — Z79899 Other long term (current) drug therapy: Secondary | ICD-10-CM | POA: Diagnosis not present

## 2018-03-10 DIAGNOSIS — S72142K Displaced intertrochanteric fracture of left femur, subsequent encounter for closed fracture with nonunion: Secondary | ICD-10-CM | POA: Diagnosis not present

## 2018-03-10 DIAGNOSIS — J449 Chronic obstructive pulmonary disease, unspecified: Secondary | ICD-10-CM | POA: Diagnosis present

## 2018-03-10 DIAGNOSIS — Z880 Allergy status to penicillin: Secondary | ICD-10-CM

## 2018-03-10 DIAGNOSIS — S72141A Displaced intertrochanteric fracture of right femur, initial encounter for closed fracture: Secondary | ICD-10-CM | POA: Diagnosis not present

## 2018-03-10 DIAGNOSIS — Z96649 Presence of unspecified artificial hip joint: Secondary | ICD-10-CM

## 2018-03-10 DIAGNOSIS — Z79891 Long term (current) use of opiate analgesic: Secondary | ICD-10-CM | POA: Diagnosis not present

## 2018-03-10 DIAGNOSIS — S72142A Displaced intertrochanteric fracture of left femur, initial encounter for closed fracture: Secondary | ICD-10-CM | POA: Diagnosis not present

## 2018-03-10 DIAGNOSIS — Y792 Prosthetic and other implants, materials and accessory orthopedic devices associated with adverse incidents: Secondary | ICD-10-CM | POA: Diagnosis present

## 2018-03-10 DIAGNOSIS — Z970 Presence of artificial eye: Secondary | ICD-10-CM | POA: Diagnosis not present

## 2018-03-10 DIAGNOSIS — J441 Chronic obstructive pulmonary disease with (acute) exacerbation: Secondary | ICD-10-CM | POA: Diagnosis not present

## 2018-03-10 DIAGNOSIS — Z471 Aftercare following joint replacement surgery: Secondary | ICD-10-CM | POA: Diagnosis not present

## 2018-03-10 DIAGNOSIS — Z96642 Presence of left artificial hip joint: Secondary | ICD-10-CM | POA: Diagnosis not present

## 2018-03-10 HISTORY — PX: TOTAL HIP REVISION: SHX763

## 2018-03-10 LAB — COMPREHENSIVE METABOLIC PANEL
ALT: 10 U/L (ref 0–44)
AST: 14 U/L — ABNORMAL LOW (ref 15–41)
Albumin: 3 g/dL — ABNORMAL LOW (ref 3.5–5.0)
Alkaline Phosphatase: 112 U/L (ref 38–126)
Anion gap: 12 (ref 5–15)
BILIRUBIN TOTAL: 0.8 mg/dL (ref 0.3–1.2)
BUN: 16 mg/dL (ref 8–23)
CHLORIDE: 103 mmol/L (ref 98–111)
CO2: 25 mmol/L (ref 22–32)
Calcium: 8.6 mg/dL — ABNORMAL LOW (ref 8.9–10.3)
Creatinine, Ser: 0.7 mg/dL (ref 0.44–1.00)
Glucose, Bld: 165 mg/dL — ABNORMAL HIGH (ref 70–99)
POTASSIUM: 3.7 mmol/L (ref 3.5–5.1)
Sodium: 140 mmol/L (ref 135–145)
TOTAL PROTEIN: 6.2 g/dL — AB (ref 6.5–8.1)

## 2018-03-10 LAB — CBC WITH DIFFERENTIAL/PLATELET
Abs Immature Granulocytes: 0.08 10*3/uL — ABNORMAL HIGH (ref 0.00–0.07)
BASOS PCT: 1 %
Basophils Absolute: 0.1 10*3/uL (ref 0.0–0.1)
EOS ABS: 0.1 10*3/uL (ref 0.0–0.5)
EOS PCT: 1 %
HCT: 35.7 % — ABNORMAL LOW (ref 36.0–46.0)
Hemoglobin: 10.6 g/dL — ABNORMAL LOW (ref 12.0–15.0)
Immature Granulocytes: 1 %
Lymphocytes Relative: 14 %
Lymphs Abs: 1.5 10*3/uL (ref 0.7–4.0)
MCH: 27.2 pg (ref 26.0–34.0)
MCHC: 29.7 g/dL — AB (ref 30.0–36.0)
MCV: 91.5 fL (ref 80.0–100.0)
MONO ABS: 1.1 10*3/uL — AB (ref 0.1–1.0)
MONOS PCT: 11 %
NEUTROS PCT: 72 %
Neutro Abs: 7.6 10*3/uL (ref 1.7–7.7)
Platelets: 394 10*3/uL (ref 150–400)
RBC: 3.9 MIL/uL (ref 3.87–5.11)
RDW: 15.1 % (ref 11.5–15.5)
WBC: 10.4 10*3/uL (ref 4.0–10.5)
nRBC: 0 % (ref 0.0–0.2)

## 2018-03-10 LAB — URINALYSIS, MICROSCOPIC (REFLEX)

## 2018-03-10 LAB — URINALYSIS, ROUTINE W REFLEX MICROSCOPIC
BILIRUBIN URINE: NEGATIVE
Glucose, UA: 150 mg/dL — AB
HGB URINE DIPSTICK: NEGATIVE
Ketones, ur: NEGATIVE mg/dL
Nitrite: POSITIVE — AB
PROTEIN: NEGATIVE mg/dL
SPECIFIC GRAVITY, URINE: 1.027 (ref 1.005–1.030)
pH: 5 (ref 5.0–8.0)

## 2018-03-10 LAB — SURGICAL PCR SCREEN
MRSA, PCR: NEGATIVE
STAPHYLOCOCCUS AUREUS: NEGATIVE

## 2018-03-10 LAB — GLUCOSE, CAPILLARY
GLUCOSE-CAPILLARY: 209 mg/dL — AB (ref 70–99)
Glucose-Capillary: 152 mg/dL — ABNORMAL HIGH (ref 70–99)
Glucose-Capillary: 262 mg/dL — ABNORMAL HIGH (ref 70–99)
Glucose-Capillary: 181 mg/dL — ABNORMAL HIGH (ref 70–99)

## 2018-03-10 LAB — TYPE AND SCREEN
ABO/RH(D): O POS
Antibody Screen: NEGATIVE

## 2018-03-10 LAB — PROTIME-INR
INR: 1.08
PROTHROMBIN TIME: 13.9 s (ref 11.4–15.2)

## 2018-03-10 LAB — APTT: aPTT: 31 seconds (ref 24–36)

## 2018-03-10 SURGERY — TOTAL HIP REVISION
Anesthesia: Spinal | Laterality: Left

## 2018-03-10 MED ORDER — METOCLOPRAMIDE HCL 5 MG/ML IJ SOLN: 5.0000 mg | Freq: Three times a day (TID) | INTRAMUSCULAR | Status: DC | PRN

## 2018-03-10 MED ORDER — TRANEXAMIC ACID-NACL 1000-0.7 MG/100ML-% IV SOLN
1000.0000 mg | INTRAVENOUS | Status: AC
  Administered 2018-03-10: 1000 mg via INTRAVENOUS
  Filled 2018-03-10: qty 100

## 2018-03-10 MED ORDER — CLINDAMYCIN PHOSPHATE 600 MG/50ML IV SOLN
600.0000 mg | Freq: Four times a day (QID) | INTRAVENOUS | Status: AC
  Administered 2018-03-10 (×2): 600 mg via INTRAVENOUS
  Filled 2018-03-10 (×2): qty 50

## 2018-03-10 MED ORDER — ONDANSETRON HCL 4 MG PO TABS: 4.0000 mg | ORAL_TABLET | Freq: Four times a day (QID) | ORAL | Status: DC | PRN

## 2018-03-10 MED ORDER — FENTANYL CITRATE (PF) 100 MCG/2ML IJ SOLN
INTRAMUSCULAR | Status: AC
  Filled 2018-03-10: qty 2

## 2018-03-10 MED ORDER — AMIODARONE HCL 200 MG PO TABS
100.0000 mg | ORAL_TABLET | Freq: Every day | ORAL | Status: DC
  Administered 2018-03-11 – 2018-03-13 (×3): 100 mg via ORAL
  Filled 2018-03-10 (×3): qty 1

## 2018-03-10 MED ORDER — ONDANSETRON HCL 4 MG/2ML IJ SOLN: 4.0000 mg | Freq: Once | INTRAMUSCULAR | Status: DC | PRN

## 2018-03-10 MED ORDER — BUPIVACAINE HCL (PF) 0.5 % IJ SOLN
INTRAMUSCULAR | Status: DC | PRN
  Administered 2018-03-10: 2.5 mL

## 2018-03-10 MED ORDER — TETRACAINE HCL 1 % IJ SOLN
INTRAMUSCULAR | Status: DC | PRN
  Administered 2018-03-10: 5 mg via INTRASPINAL

## 2018-03-10 MED ORDER — ONDANSETRON HCL 4 MG/2ML IJ SOLN
4.0000 mg | Freq: Four times a day (QID) | INTRAMUSCULAR | Status: DC | PRN
  Administered 2018-03-11 – 2018-03-13 (×3): 4 mg via INTRAVENOUS
  Filled 2018-03-10 (×3): qty 2

## 2018-03-10 MED ORDER — PROPOFOL 500 MG/50ML IV EMUL
INTRAVENOUS | Status: AC
  Filled 2018-03-10: qty 50

## 2018-03-10 MED ORDER — METOCLOPRAMIDE HCL 10 MG PO TABS: 5.0000 mg | ORAL_TABLET | Freq: Three times a day (TID) | ORAL | Status: DC | PRN

## 2018-03-10 MED ORDER — TRAVOPROST (BAK FREE) 0.004 % OP SOLN
1.0000 [drp] | Freq: Every day | OPHTHALMIC | Status: DC
  Administered 2018-03-10 – 2018-03-12 (×3): 1 [drp] via OPHTHALMIC
  Filled 2018-03-10: qty 2.5

## 2018-03-10 MED ORDER — SENNOSIDES-DOCUSATE SODIUM 8.6-50 MG PO TABS
1.0000 | ORAL_TABLET | Freq: Two times a day (BID) | ORAL | Status: DC
  Administered 2018-03-10 – 2018-03-13 (×6): 1 via ORAL
  Filled 2018-03-10 (×6): qty 1

## 2018-03-10 MED ORDER — DEXAMETHASONE SODIUM PHOSPHATE 10 MG/ML IJ SOLN
INTRAMUSCULAR | Status: AC
  Filled 2018-03-10: qty 1

## 2018-03-10 MED ORDER — PHENOL 1.4 % MT LIQD
1.0000 | OROMUCOSAL | Status: DC | PRN
  Filled 2018-03-10: qty 177

## 2018-03-10 MED ORDER — MENTHOL 3 MG MT LOZG
1.0000 | LOZENGE | OROMUCOSAL | Status: DC | PRN
  Filled 2018-03-10: qty 9

## 2018-03-10 MED ORDER — NEOMYCIN-POLYMYXIN B GU 40-200000 IR SOLN
Status: AC
  Filled 2018-03-10: qty 20

## 2018-03-10 MED ORDER — PROPOFOL 500 MG/50ML IV EMUL
INTRAVENOUS | Status: DC | PRN
  Administered 2018-03-10: 35 ug/kg/min via INTRAVENOUS

## 2018-03-10 MED ORDER — FLEET ENEMA 7-19 GM/118ML RE ENEM: 1.0000 | ENEMA | Freq: Once | RECTAL | Status: DC | PRN

## 2018-03-10 MED ORDER — CHLORHEXIDINE GLUCONATE 4 % EX LIQD
60.0000 mL | Freq: Once | CUTANEOUS | Status: AC
  Administered 2018-03-10: 4 via TOPICAL

## 2018-03-10 MED ORDER — FAMOTIDINE 20 MG PO TABS
ORAL_TABLET | ORAL | Status: AC
  Filled 2018-03-10: qty 1

## 2018-03-10 MED ORDER — DEXAMETHASONE SODIUM PHOSPHATE 10 MG/ML IJ SOLN
8.0000 mg | Freq: Once | INTRAMUSCULAR | Status: AC
  Administered 2018-03-10: 8 mg via INTRAVENOUS

## 2018-03-10 MED ORDER — GLIMEPIRIDE 2 MG PO TABS
2.0000 mg | ORAL_TABLET | Freq: Every day | ORAL | Status: DC
  Administered 2018-03-11 – 2018-03-13 (×3): 2 mg via ORAL
  Filled 2018-03-10 (×3): qty 1

## 2018-03-10 MED ORDER — OXYCODONE HCL 5 MG PO TABS
10.0000 mg | ORAL_TABLET | ORAL | Status: DC | PRN
  Administered 2018-03-10 – 2018-03-12 (×4): 10 mg via ORAL
  Filled 2018-03-10 (×4): qty 2

## 2018-03-10 MED ORDER — FERROUS SULFATE 325 (65 FE) MG PO TABS
325.0000 mg | ORAL_TABLET | Freq: Two times a day (BID) | ORAL | Status: DC
  Administered 2018-03-10 – 2018-03-12 (×5): 325 mg via ORAL
  Filled 2018-03-10 (×6): qty 1

## 2018-03-10 MED ORDER — SALINE SPRAY 0.65 % NA SOLN
2.0000 | Freq: Every day | NASAL | Status: DC | PRN
  Administered 2018-03-12: 2 via NASAL
  Filled 2018-03-10 (×2): qty 44

## 2018-03-10 MED ORDER — ALBUTEROL SULFATE (2.5 MG/3ML) 0.083% IN NEBU: 3.0000 mL | INHALATION_SOLUTION | Freq: Four times a day (QID) | RESPIRATORY_TRACT | Status: DC | PRN

## 2018-03-10 MED ORDER — KETAMINE HCL 50 MG/ML IJ SOLN
INTRAMUSCULAR | Status: AC
  Filled 2018-03-10: qty 10

## 2018-03-10 MED ORDER — NEOMYCIN-POLYMYXIN B GU 40-200000 IR SOLN
Status: DC | PRN
  Administered 2018-03-10: 4 mL

## 2018-03-10 MED ORDER — HYDROMORPHONE HCL 1 MG/ML IJ SOLN
0.5000 mg | INTRAMUSCULAR | Status: DC | PRN
  Administered 2018-03-11: 0.5 mg via INTRAVENOUS
  Filled 2018-03-10: qty 1

## 2018-03-10 MED ORDER — CLINDAMYCIN PHOSPHATE 900 MG/50ML IV SOLN
INTRAVENOUS | Status: AC
  Filled 2018-03-10: qty 50

## 2018-03-10 MED ORDER — KETAMINE HCL 50 MG/ML IJ SOLN
INTRAMUSCULAR | Status: DC | PRN
  Administered 2018-03-10 (×2): 25 mg via INTRAMUSCULAR

## 2018-03-10 MED ORDER — ACETAMINOPHEN 325 MG PO TABS
325.0000 mg | ORAL_TABLET | Freq: Four times a day (QID) | ORAL | Status: DC | PRN
  Administered 2018-03-11: 650 mg via ORAL
  Administered 2018-03-12: 325 mg via ORAL
  Filled 2018-03-10 (×2): qty 2

## 2018-03-10 MED ORDER — MAGNESIUM HYDROXIDE 400 MG/5ML PO SUSP: 30.0000 mL | Freq: Every day | ORAL | Status: DC | PRN

## 2018-03-10 MED ORDER — INSULIN ASPART 100 UNIT/ML ~~LOC~~ SOLN
0.0000 [IU] | Freq: Three times a day (TID) | SUBCUTANEOUS | Status: DC
  Administered 2018-03-10: 8 [IU] via SUBCUTANEOUS
  Administered 2018-03-11: 2 [IU] via SUBCUTANEOUS
  Administered 2018-03-11: 5 [IU] via SUBCUTANEOUS
  Administered 2018-03-11: 3 [IU] via SUBCUTANEOUS
  Administered 2018-03-12: 8 [IU] via SUBCUTANEOUS
  Administered 2018-03-13: 5 [IU] via SUBCUTANEOUS
  Administered 2018-03-13: 3 [IU] via SUBCUTANEOUS
  Filled 2018-03-10 (×7): qty 1

## 2018-03-10 MED ORDER — BISACODYL 10 MG RE SUPP
10.0000 mg | Freq: Every day | RECTAL | Status: DC | PRN
  Administered 2018-03-12: 10 mg via RECTAL
  Filled 2018-03-10: qty 1

## 2018-03-10 MED ORDER — FENTANYL CITRATE (PF) 100 MCG/2ML IJ SOLN
INTRAMUSCULAR | Status: AC
  Administered 2018-03-10: 25 ug via INTRAVENOUS
  Filled 2018-03-10: qty 2

## 2018-03-10 MED ORDER — CLINDAMYCIN PHOSPHATE 900 MG/50ML IV SOLN
900.0000 mg | INTRAVENOUS | Status: AC
  Administered 2018-03-10: 900 mg via INTRAVENOUS

## 2018-03-10 MED ORDER — APIXABAN 2.5 MG PO TABS
2.5000 mg | ORAL_TABLET | Freq: Two times a day (BID) | ORAL | Status: DC
  Administered 2018-03-11 – 2018-03-13 (×5): 2.5 mg via ORAL
  Filled 2018-03-10 (×5): qty 1

## 2018-03-10 MED ORDER — SODIUM CHLORIDE 0.9 % IV SOLN
INTRAVENOUS | Status: DC
  Administered 2018-03-10 (×2): via INTRAVENOUS

## 2018-03-10 MED ORDER — ACETAMINOPHEN 10 MG/ML IV SOLN
INTRAVENOUS | Status: AC
  Filled 2018-03-10: qty 100

## 2018-03-10 MED ORDER — VITAMIN D 25 MCG (1000 UNIT) PO TABS
2000.0000 [IU] | ORAL_TABLET | Freq: Every day | ORAL | Status: DC
  Administered 2018-03-11 – 2018-03-13 (×3): 2000 [IU] via ORAL
  Filled 2018-03-10 (×3): qty 2

## 2018-03-10 MED ORDER — BRIMONIDINE TARTRATE 0.2 % OP SOLN
1.0000 [drp] | Freq: Two times a day (BID) | OPHTHALMIC | Status: DC
  Administered 2018-03-10 – 2018-03-13 (×6): 1 [drp] via OPHTHALMIC
  Filled 2018-03-10: qty 5

## 2018-03-10 MED ORDER — METOCLOPRAMIDE HCL 10 MG PO TABS
10.0000 mg | ORAL_TABLET | Freq: Three times a day (TID) | ORAL | Status: AC
  Administered 2018-03-10 – 2018-03-12 (×7): 10 mg via ORAL
  Filled 2018-03-10 (×7): qty 1

## 2018-03-10 MED ORDER — PANTOPRAZOLE SODIUM 40 MG PO TBEC
40.0000 mg | DELAYED_RELEASE_TABLET | Freq: Two times a day (BID) | ORAL | Status: DC
  Administered 2018-03-10 – 2018-03-13 (×6): 40 mg via ORAL
  Filled 2018-03-10 (×6): qty 1

## 2018-03-10 MED ORDER — TRAMADOL HCL 50 MG PO TABS
50.0000 mg | ORAL_TABLET | ORAL | Status: DC | PRN
  Administered 2018-03-10 – 2018-03-12 (×2): 100 mg via ORAL
  Administered 2018-03-12: 50 mg via ORAL
  Administered 2018-03-13 (×2): 100 mg via ORAL
  Filled 2018-03-10: qty 1
  Filled 2018-03-10 (×4): qty 2
  Filled 2018-03-10: qty 1

## 2018-03-10 MED ORDER — SODIUM CHLORIDE 0.9 % IV SOLN
INTRAVENOUS | Status: DC | PRN
  Administered 2018-03-10: 20 ug/min via INTRAVENOUS

## 2018-03-10 MED ORDER — ACETAMINOPHEN 10 MG/ML IV SOLN
1000.0000 mg | Freq: Four times a day (QID) | INTRAVENOUS | Status: AC
  Administered 2018-03-10 – 2018-03-11 (×4): 1000 mg via INTRAVENOUS
  Filled 2018-03-10 (×4): qty 100

## 2018-03-10 MED ORDER — FAMOTIDINE 20 MG PO TABS
20.0000 mg | ORAL_TABLET | Freq: Once | ORAL | Status: AC
  Administered 2018-03-10: 20 mg via ORAL

## 2018-03-10 MED ORDER — METRONIDAZOLE 0.75 % VA GEL
1.0000 | Freq: Every day | VAGINAL | Status: DC
  Administered 2018-03-12: 1 via VAGINAL
  Filled 2018-03-10: qty 70

## 2018-03-10 MED ORDER — SODIUM CHLORIDE 0.9 % IV SOLN
INTRAVENOUS | Status: DC
  Administered 2018-03-10 – 2018-03-11 (×2): via INTRAVENOUS

## 2018-03-10 MED ORDER — FENTANYL CITRATE (PF) 100 MCG/2ML IJ SOLN
25.0000 ug | INTRAMUSCULAR | Status: DC | PRN
  Administered 2018-03-10: 25 ug via INTRAVENOUS

## 2018-03-10 MED ORDER — OXYCODONE HCL 5 MG PO TABS
5.0000 mg | ORAL_TABLET | ORAL | Status: DC | PRN
  Administered 2018-03-13: 5 mg via ORAL
  Filled 2018-03-10: qty 1

## 2018-03-10 MED ORDER — PROPOFOL 10 MG/ML IV BOLUS
INTRAVENOUS | Status: DC | PRN
  Administered 2018-03-10 (×2): 20 mg via INTRAVENOUS

## 2018-03-10 MED ORDER — AMLODIPINE BESYLATE 5 MG PO TABS
5.0000 mg | ORAL_TABLET | Freq: Every day | ORAL | Status: DC
  Administered 2018-03-12 – 2018-03-13 (×2): 5 mg via ORAL
  Filled 2018-03-10 (×3): qty 1

## 2018-03-10 SURGICAL SUPPLY — 59 items
BLADE SAW SAG 25X90X1.19 (BLADE) ×3 IMPLANT
BLADE SURG SZ10 CARB STEEL (BLADE) ×3 IMPLANT
CANISTER SUCT 1200ML W/VALVE (MISCELLANEOUS) ×3 IMPLANT
CANISTER SUCT 3000ML PPV (MISCELLANEOUS) ×6 IMPLANT
COVER WAND RF STERILE (DRAPES) ×3 IMPLANT
DRAPE INCISE IOBAN 66X60 STRL (DRAPES) ×3 IMPLANT
DRAPE SHEET LG 3/4 BI-LAMINATE (DRAPES) ×6 IMPLANT
DRAPE TABLE BACK 80X90 (DRAPES) ×3 IMPLANT
DRSG DERMACEA 8X12 NADH (GAUZE/BANDAGES/DRESSINGS) ×3 IMPLANT
DRSG OPSITE POSTOP 4X14 (GAUZE/BANDAGES/DRESSINGS) ×3 IMPLANT
DURAPREP 26ML APPLICATOR (WOUND CARE) ×3 IMPLANT
ELECT BLADE 6.5 EXT (BLADE) ×3 IMPLANT
ELECT CAUTERY BLADE 6.4 (BLADE) ×3 IMPLANT
GAUZE SPONGE 4X4 12PLY STRL (GAUZE/BANDAGES/DRESSINGS) ×3 IMPLANT
GLOVE BIOGEL M STRL SZ7.5 (GLOVE) ×6 IMPLANT
GLOVE BIOGEL PI IND STRL 9 (GLOVE) ×1 IMPLANT
GLOVE BIOGEL PI INDICATOR 9 (GLOVE) ×2
GLOVE INDICATOR 8.0 STRL GRN (GLOVE) ×3 IMPLANT
GLOVE SURG SYN 9.0  PF PI (GLOVE) ×2
GLOVE SURG SYN 9.0 PF PI (GLOVE) ×1 IMPLANT
GOWN STRL REUS W/ TWL LRG LVL3 (GOWN DISPOSABLE) ×2 IMPLANT
GOWN STRL REUS W/TWL 2XL LVL3 (GOWN DISPOSABLE) ×3 IMPLANT
GOWN STRL REUS W/TWL LRG LVL3 (GOWN DISPOSABLE) ×4
HANDPIECE VERSAJET DEBRIDEMENT (MISCELLANEOUS) IMPLANT
HEAD FEM UNIPOLAR 44 OD STRL (Hips) ×3 IMPLANT
HEMOVAC 400CC 10FR (MISCELLANEOUS) ×3 IMPLANT
HOLDER FOLEY CATH W/STRAP (MISCELLANEOUS) ×3 IMPLANT
HOOD PEEL AWAY FLYTE STAYCOOL (MISCELLANEOUS) ×6 IMPLANT
IRRIGATION STRYKERFLOW (MISCELLANEOUS) ×1 IMPLANT
IRRIGATOR STRYKERFLOW (MISCELLANEOUS) ×3
IV NS 100ML SINGLE PACK (IV SOLUTION) ×3 IMPLANT
NDL SAFETY ECLIPSE 18X1.5 (NEEDLE) ×1 IMPLANT
NEEDLE FILTER BLUNT 18X 1/2SAF (NEEDLE) ×2
NEEDLE FILTER BLUNT 18X1 1/2 (NEEDLE) ×1 IMPLANT
NEEDLE HYPO 18GX1.5 SHARP (NEEDLE) ×2
NS IRRIG 1000ML POUR BTL (IV SOLUTION) ×3 IMPLANT
PACK HIP PROSTHESIS (MISCELLANEOUS) ×3 IMPLANT
PENCIL SMOKE ULTRAEVAC 22 CON (MISCELLANEOUS) ×3 IMPLANT
PULSAVAC PLUS IRRIG FAN TIP (DISPOSABLE) ×3
SOL .9 NS 3000ML IRR  AL (IV SOLUTION) ×2
SOL .9 NS 3000ML IRR UROMATIC (IV SOLUTION) ×1 IMPLANT
SPACER DEPUY (Hips) ×3 IMPLANT
STAPLER SKIN PROX 35W (STAPLE) ×3 IMPLANT
STEM FEM CMNTLSS SM AML 13.5 (Hips) ×3 IMPLANT
STRAP SAFETY 5IN WIDE (MISCELLANEOUS) ×3 IMPLANT
SUCTION FRAZIER HANDLE 10FR (MISCELLANEOUS) ×2
SUCTION TUBE FRAZIER 10FR DISP (MISCELLANEOUS) ×1 IMPLANT
SUT ETHIBOND #5 BRAIDED 30INL (SUTURE) ×3 IMPLANT
SUT VIC AB 0 CT1 36 (SUTURE) ×3 IMPLANT
SUT VIC AB 1 CT1 36 (SUTURE) ×6 IMPLANT
SUT VIC AB 2-0 CT1 27 (SUTURE) ×2
SUT VIC AB 2-0 CT1 TAPERPNT 27 (SUTURE) ×1 IMPLANT
SYR 20CC LL (SYRINGE) ×3 IMPLANT
TAPE CLOTH 3X10 WHT NS LF (GAUZE/BANDAGES/DRESSINGS) ×3 IMPLANT
TAPE TRANSPORE STRL 2 31045 (GAUZE/BANDAGES/DRESSINGS) ×3 IMPLANT
TIP BRUSH PULSAVAC PLUS 24.33 (MISCELLANEOUS) IMPLANT
TIP FAN IRRIG PULSAVAC PLUS (DISPOSABLE) ×1 IMPLANT
TOWEL OR 17X26 4PK STRL BLUE (TOWEL DISPOSABLE) ×3 IMPLANT
TRAY FOLEY MTR SLVR 16FR STAT (SET/KITS/TRAYS/PACK) ×3 IMPLANT

## 2018-03-10 NOTE — Transfer of Care (Signed)
Immediate Anesthesia Transfer of Care Note  Patient: Sarah Dougherty  Procedure(s) Performed: TOTAL HIP REVISION (Left )  Patient Location: PACU  Anesthesia Type:Spinal  Level of Consciousness: awake and alert   Airway & Oxygen Therapy: Patient Spontanous Breathing  Post-op Assessment: Report given to RN and Post -op Vital signs reviewed and stable  Post vital signs: Reviewed and stable  Last Vitals:  Vitals Value Taken Time  BP 110/61 03/10/2018  2:40 PM  Temp    Pulse 58 03/10/2018  2:45 PM  Resp 19 03/10/2018  2:45 PM  SpO2 97 % 03/10/2018  2:45 PM  Vitals shown include unvalidated device data.  Last Pain:  Vitals:   03/10/18 0922  TempSrc: Temporal  PainSc: 10-Worst pain ever         Complications: No apparent anesthesia complications

## 2018-03-10 NOTE — H&P (Signed)
The patient has been re-examined, and the chart reviewed, and there have been no interval changes to the documented history and physical.    The risks, benefits, and alternatives have been discussed at length. The patient expressed understanding of the risks benefits and agreed with plans for surgical intervention.  Bradlee Bridgers P. Lakie Mclouth, Jr. M.D.    

## 2018-03-10 NOTE — Anesthesia Post-op Follow-up Note (Signed)
Anesthesia QCDR form completed.        

## 2018-03-10 NOTE — Anesthesia Procedure Notes (Signed)
Spinal  Patient location during procedure: OR Start time: 03/10/2018 11:05 AM End time: 03/10/2018 11:16 AM Staffing Resident/CRNA: Nelda Marseille, CRNA Performed: resident/CRNA  Preanesthetic Checklist Completed: patient identified, site marked, surgical consent, pre-op evaluation, timeout performed, IV checked, risks and benefits discussed and monitors and equipment checked Spinal Block Patient position: sitting Prep: Betadine Patient monitoring: heart rate, continuous pulse ox, blood pressure and cardiac monitor Approach: midline Location: L3-4 Injection technique: single-shot Needle Needle type: [place] and Introducer  Needle gauge: 24 G Needle length: 9 cm Assessment Sensory level: T10 Additional Notes Negative paresthesia. Negative blood return. Positive free-flowing CSF. Expiration date of kit checked and confirmed. Patient tolerated procedure well, without complications.

## 2018-03-10 NOTE — Op Note (Signed)
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 M.D.  ANESTHESIA: spinal and general  ESTIMATED BLOOD LOSS: 150 mL  FLUIDS REPLACED: 1100 mL of crystalloid  DRAINS: 2 medium drains to a Hemovac reservoir  IMPLANTS UTILIZED: DePuy 13.5 mm small stature AML femoral stem, 44 mm OD Cathcart hip ball, -3 mm tapered spacer  INDICATIONS FOR SURGERY: Sarah Dougherty is a 84 y.o. year old female who underwent ORIF of a left intertrochanteric femur fracture approximately 6 weeks ago.  Upon follow-up, radiographs demonstrated evidence of nonunion with cut out of the helical blade.  After discussion of the risks and benefits of surgical intervention, the patient expressed understanding of the risks benefits and agree with plans for hardware removal and conversion to hip hemiarthroplasty.   The risks, benefits, and alternatives were discussed at length including but not limited to the risks of infection, bleeding, nerve injury, stiffness, blood clots, the need for revision surgery, limb length inequality, dislocation, cardiopulmonary complications, among others, and they were willing to proceed.  PROCEDURE IN DETAIL: The patient was brought into the operating room and, after adequate spinal and general anesthesia was achieved, patient was placed in a right lateral decubitus position. Axillary roll was placed and all bony prominences were well-padded. The patient's left hip was cleaned and prepped with alcohol and DuraPrep and draped in the usual sterile fashion. A "timeout" was performed as per usual protocol. A lateral curvilinear incision was made gently curving towards the posterior  superior iliac spine. The IT band was incised in line with the skin incision and the fibers of the gluteus maximus were split in line.  The proximal portion of the trochanteric fixation nail was identified and the outrigger device was attached.  The locking screw was backed out and then the helical blade was removed.  Finally, the 5.0 mm distal locking screw was removed.  The trochanteric fixation nail was then easily removed.  Next, dissection was carried out posteriorly with incision and elevation of the short external rotators.  A T type posterior capsulotomy was performed. The femoral head was then removed using a corkscrew device.  Findings were consistent with gross nonunion of the previous fracture site.  The femoral head was measured using calipers and ring gauges and determined to be 44 mm in diameter. Most of the calcar was absent.  The acetabulum was inspected for any bony fragments. The articular surface was in good condition.  Attention was then directed to the proximal femur.  The femoral canal was reamed in a sequential fashion up to a 13.5 mm diameter.  Serial broaches were inserted up to a 13.5 mm small stature AML broach.  A trial reduction was performed using a 44 mm OD Cathcart ball with a -3 mm neck length. Good equalization of limb lengths was appreciated and excellent stability was noted both anteriorly and posteriorly. Trial components were removed.  A 13.5 mm small stature AML femoral component was positioned and impacted into place to the predetermined depth.  Good fixation was appreciated.  Trial reduction was again performed with a 44 mm Cathcart hip ball and a -3 mm tapered spacer.  The hip was reduced and placed range of motion  with excellent stability and good restoration of hip offset and equalization of limb length.  The femoral trial was dislocated in the trial hip ball was removed.  The Morse taper was cleaned and dried. A 44 mm outer diameter Cathcart hip ball with a 93 mm  tapered spacer was placed on the trunnion and impacted into place. The acetabulum was again irrigated and suctioned dry, making sure to inspect for any residal bony debris. The femoral head was then reduced and placed through a range of motion. Excellent stability was noted both anteriorly and posteriorly. Good equalization of limb lengths was appreciated.   The wound was irrigated with copious amounts of normal saline with antibiotic solution and suctioned dry. Good hemostasis was appreciated. The posterior capsulotomy was repaired using #5 Ethibond. Piriformis tendon was reapproximated to the undersurface of the gluteus medius tendon using #5 Ethibond. Two medium drains were placed in the wound bed and brought out through separate stab incisions to be attached to a Hemovac reservoir. The IT band was reapproximated using interrupted sutures of #1 Vicryl. Subcutaneous tissue was proximal phalanx using first #0 Vicryl followed by #2-0 Vicryl. The skin was closed with skin staples.  The patient tolerated the procedure well and was transported to the recovery room in stable condition.   Marciano Sequin., M.D.

## 2018-03-10 NOTE — Discharge Instructions (Signed)
Instructions after Total Hip Replacement ° ° °  Liandra Mendia P. Perez Dirico, Jr., M.D.    ° Dept. of Orthopaedics & Sports Medicine ° [facility] ° [address] ° [place], [place]  [address] ° Phone: [phone]   Fax: [phone] ° °  °DIET: °• Drink plenty of non-alcoholic fluids. °• Resume your normal diet. Include foods high in fiber. ° °ACTIVITY:  °• You may use crutches or a walker with weight-bearing as tolerated, unless instructed otherwise. °• You may be weaned off of the walker or crutches by your Physical Therapist.  °• Do NOT reach below the level of your knees or cross your legs until allowed.    °• Continue doing gentle exercises. Exercising will reduce the pain and swelling, increase motion, and prevent muscle weakness.   °• Please continue to use the TED compression stockings for 6 weeks. You may remove the stockings at night, but should reapply them in the morning. °• Do not drive or operate any equipment until instructed. ° °WOUND CARE:  °• Continue to use ice packs periodically to reduce pain and swelling. °• Keep the incision clean and dry. °• You may bathe or shower after the staples are removed at the first office visit following surgery. ° °MEDICATIONS: °• You may resume your regular medications. °• Please take the pain medication as prescribed on the medication. °• Do not take pain medication on an empty stomach. °• You have been given a prescription for a blood thinner to prevent blood clots. Please take the medication as instructed. (NOTE: After completing a 2 week course of Lovenox, take one Enteric-coated aspirin once a day.) °• Pain medications and iron supplements can cause constipation. Use a stool softener (Senokot or Colace) on a daily basis and a laxative (dulcolax or miralax) as needed. °• Do not drive or drink alcoholic beverages when taking pain medications. ° °CALL THE OFFICE FOR: °• Temperature above 101 degrees °• Excessive bleeding or drainage on the dressing. °• Excessive  swelling, coldness, or paleness of the toes. °• Persistent nausea and vomiting. ° °FOLLOW-UP:  °• You should have an appointment to return to the office in 6 weeks after surgery. °• Arrangements have been made for continuation of Physical Therapy (either home therapy or outpatient therapy). °  °

## 2018-03-10 NOTE — Anesthesia Preprocedure Evaluation (Signed)
Anesthesia Evaluation  Patient identified by MRN, date of birth, ID band Patient awake    Reviewed: Allergy & Precautions, H&P , NPO status , Patient's Chart, lab work & pertinent test results, reviewed documented beta blocker date and time   History of Anesthesia Complications Negative for: history of anesthetic complications  Airway Mallampati: II  TM Distance: >3 FB Neck ROM: full    Dental  (+) Edentulous Upper, Edentulous Lower, Poor Dentition, Dental Advidsory Given   Pulmonary neg pulmonary ROS, neg shortness of breath, asthma , sleep apnea , COPD,  oxygen dependent, neg recent URI,           Cardiovascular Exercise Tolerance: Good hypertension, (-) angina+ CAD and +CHF  (-) Past MI, (-) Cardiac Stents and (-) CABG + dysrhythmias + pacemaker (-) Valvular Problems/Murmurs     Neuro/Psych negative neurological ROS  negative psych ROS   GI/Hepatic Neg liver ROS, GERD  ,  Endo/Other  negative endocrine ROSdiabetesHyperthyroidism   Renal/GU Renal disease  negative genitourinary   Musculoskeletal   Abdominal   Peds  Hematology negative hematology ROS (+)   Anesthesia Other Findings Past Medical History: No date: A-fib (HCC) No date: Arthritis     Comment:  Osteoarthritis No date: Asthma No date: Chronic kidney disease     Comment:  UTI No date: COPD (chronic obstructive pulmonary disease) (HCC) No date: Coronary artery disease No date: Diabetes mellitus without complication (HCC) No date: GERD (gastroesophageal reflux disease) No date: Glaucoma (increased eye pressure) No date: HOH (hard of hearing)     Comment:  Left Hearing Aid No date: Hypertension No date: Hyperthyroidism No date: On home oxygen therapy     Comment:  uses at night No date: Presence of permanent cardiac pacemaker No date: Sleep apnea     Comment:  No C-PAP   Reproductive/Obstetrics negative OB ROS                              Anesthesia Physical  Anesthesia Plan  ASA: IV  Anesthesia Plan: Spinal   Post-op Pain Management:    Induction: Intravenous  PONV Risk Score and Plan: 2 and Ondansetron, Propofol infusion and TIVA  Airway Management Planned: Simple Face Mask and Natural Airway  Additional Equipment:   Intra-op Plan:   Post-operative Plan:   Informed Consent: I have reviewed the patients History and Physical, chart, labs and discussed the procedure including the risks, benefits and alternatives for the proposed anesthesia with the patient or authorized representative who has indicated his/her understanding and acceptance.   Dental Advisory Given  Plan Discussed with: Anesthesiologist, CRNA and Surgeon  Anesthesia Plan Comments:         Anesthesia Quick Evaluation

## 2018-03-11 LAB — GLUCOSE, CAPILLARY
GLUCOSE-CAPILLARY: 155 mg/dL — AB (ref 70–99)
GLUCOSE-CAPILLARY: 144 mg/dL — AB (ref 70–99)
Glucose-Capillary: 205 mg/dL — ABNORMAL HIGH (ref 70–99)
Glucose-Capillary: 125 mg/dL — ABNORMAL HIGH (ref 70–99)

## 2018-03-11 MED ORDER — TRAMADOL HCL 50 MG PO TABS: 50.0000 mg | ORAL_TABLET | ORAL | 1 refills | Status: DC | PRN

## 2018-03-11 MED ORDER — OXYCODONE HCL 5 MG PO TABS: 5.0000 mg | ORAL_TABLET | ORAL | 0 refills | Status: DC | PRN

## 2018-03-11 NOTE — Progress Notes (Signed)
  Subjective: 1 Day Post-Op Procedure(s) (LRB): TOTAL HIP REVISION (Left) Patient reports pain as mild.   Patient seen in rounds with Dr. Roland Rack. Patient is well, and has had no acute complaints or problems Plan is to go Home versus rehab after hospital stay. Negative for chest pain and shortness of breath Fever: no Gastrointestinal: Negative for nausea and vomiting  Objective: Vital signs in last 24 hours: Temp:  [97.1 F (36.2 C)-98.8 F (37.1 C)] 98.6 F (37 C) (11/28 0300) Pulse Rate:  [59-68] 67 (11/28 0300) Resp:  [12-18] 15 (11/28 0300) BP: (96-139)/(53-71) 130/71 (11/28 0300) SpO2:  [92 %-100 %] 98 % (11/28 0300) Weight:  [45 kg] 45 kg (11/27 0922)  Intake/Output from previous day:  Intake/Output Summary (Last 24 hours) at 03/11/2018 0719 Last data filed at 03/11/2018 0600 Gross per 24 hour  Intake 2862.83 ml  Output 1330 ml  Net 1532.83 ml    Intake/Output this shift: No intake/output data recorded.  Labs: Recent Labs    03/10/18 0931  HGB 10.6*   Recent Labs    03/10/18 0931  WBC 10.4  RBC 3.90  HCT 35.7*  PLT 394   Recent Labs    03/10/18 0931  NA 140  K 3.7  CL 103  CO2 25  BUN 16  CREATININE 0.70  GLUCOSE 165*  CALCIUM 8.6*   Recent Labs    03/10/18 0931  INR 1.08     EXAM General - Patient is Alert and Confused Extremity - Sensation intact distally Intact pulses distally Compartment soft Dressing/Incision - clean, dry, blood tinged drainage with the Hemovac intact.  The patient had pulled the tubing from the Hemovac container, and this was reattached. Motor Function - intact, moving foot and toes well on exam.  Able to straight leg raise independently.  Past Medical History:  Diagnosis Date  . A-fib ([place])   . Arthritis    Osteoarthritis  . Asthma   . Cancer (HCC)    PAROTID GLAND  . CHF (congestive heart failure) ([place])   . Chronic kidney disease     / CKD STAGE 3  . COPD (chronic obstructive pulmonary disease) ([place])    . Coronary artery disease   . Diabetes mellitus without complication ([place])   . Dysrhythmia    CHRONIC AFIB  . GERD (gastroesophageal reflux disease)   . Glaucoma (increased eye pressure)   . Hip fracture ([place]) 01/24/2018  . HOH (hard of hearing)    RIGHT HEARING AID  . Hypertension   . Hyperthyroidism   . Presence of permanent cardiac pacemaker   . Sleep apnea    No C-PAP/ NO LONGER WEARS OXYGEN    Assessment/Plan: 1 Day Post-Op Procedure(s) (LRB): TOTAL HIP REVISION (Left) Active Problems:   S/P hip hemiarthroplasty  Estimated body mass index is 20.73 kg/m as calculated from the following:   Height as of this encounter: 4\' 10"  (1.473 m).   Weight as of this encounter: 45 kg. Advance diet Up with therapy D/C IV fluids Plan for discharge tomorrow to home versus rehab.  DVT Prophylaxis - Foot Pumps, TED hose and Eliquis Weight-Bearing as tolerated to left leg  Reche Dixon, PA-C Orthopaedic Surgery 03/11/2018, 7:19 AM

## 2018-03-11 NOTE — Evaluation (Signed)
Physical Therapy Evaluation Patient Details Name: Sarah Dougherty MRN: [id number] DOB: Dec 08, 1921 Today's Date: 03/11/2018   History of Present Illness  admitted for acute hospitalization status post L total hip revision (03/10/18), WBAT, post THPs.  Clinical Impression  Upon evaluation, patient alert and oriented; exceptionally HOH (requiring close range and very loud cuing).  Minimal reports of L LE pain; demonstrating good strength and ROM with isolated therex.  Minimal awareness and understanding of THPS; constant cuing and assist for adherence.  Currently requiring min assist for bed mobility; mod assist for sit/stand, basic transfers and very short-distance gait (bed/chair) with RW.  Generally unsteady with inconsistent foot placement, poor balance; requiring hands-on assist at all times for safety. Also of note, patient with reports of dizziness with transition to upright; positive orthostatic assessment noted (see vitals flowsheet for details); RN informed/aware. Would benefit from skilled PT to address above deficits and promote optimal return to PLOF; recommend transition to STR upon discharge from acute hospitalization.     Follow Up Recommendations SNF    Equipment Recommendations       Recommendations for Other Services       Precautions / Restrictions Precautions Precautions: Fall;Posterior Hip Restrictions Weight Bearing Restrictions: Yes LLE Weight Bearing: Weight bearing as tolerated      Mobility  Bed Mobility Overal bed mobility: Needs Assistance Bed Mobility: Supine to Sit     Supine to sit: Min assist     General bed mobility comments: for position and protection of L LE  Transfers Overall transfer level: Needs assistance Equipment used: Rolling walker (2 wheeled) Transfers: Sit to/from Stand Sit to Stand: Mod assist         General transfer comment: cuing for hand placement, lift off and standing balance  Ambulation/Gait Ambulation/Gait  assistance: Mod assist Gait Distance (Feet): 5 Feet Assistive device: Rolling walker (2 wheeled)       General Gait Details: very short, choppy steps; impulsive, with inconsistent foot placement.  Poor balance.  Distance limited by symptomatic orthostasis  Stairs            Wheelchair Mobility    Modified Rankin (Stroke Patients Only)       Balance Overall balance assessment: Needs assistance Sitting-balance support: Feet supported;No upper extremity supported Sitting balance-Leahy Scale: Good     Standing balance support: Bilateral upper extremity supported Standing balance-Leahy Scale: Poor                               Pertinent Vitals/Pain Pain Assessment: Faces Faces Pain Scale: Hurts a little bit Pain Location: L hip Pain Descriptors / Indicators: Aching;Grimacing;Guarding Pain Intervention(s): Monitored during session;Repositioned;Limited activity within patient's tolerance    Home Living Family/patient expects to be discharged to:: Private residence Living Arrangements: Children Available Help at Discharge: Family;Available 24 hours/day Type of Home: House Home Access: Ramped entrance     Home Layout: One level Home Equipment: Walker - 2 wheels;Walker - 4 wheels;Cane - single point;Toilet riser;Shower seat      Prior Function Level of Independence: Independent with assistive device(s)         Comments: Mod indep with RW for transfers and household mobilization; family assists with bathing/dressing, household responsibilties as needed.  Family able to provide 24/7 supervision.     Hand Dominance        Extremity/Trunk Assessment   Upper Extremity Assessment Upper Extremity Assessment: Overall WFL for tasks assessed    Lower  Extremity Assessment Lower Extremity Assessment: Generalized weakness(L hip grossly 3-/5, limited pain complaints; good isolated strength and control.  R LE grossly WFL)       Communication    Communication: HOH(extremely HOH (declines using her hearing aides))  Cognition Arousal/Alertness: Awake/alert Behavior During Therapy: WFL for tasks assessed/performed Overall Cognitive Status: Within Functional Limits for tasks assessed                                        General Comments      Exercises Other Exercises Other Exercises: Supine L LE therex, 1x10, AROM: ankle pumps, SAQs, heel slides, hip abduct/adduct.  Good isolated strenght and muscle activation; minimal reports of pain. Other Exercises: Sit/stand x3 with RW, mod assist-cuing for hand placement, lift off and overall balance.   Other Exercises: Orthostatic BP assessment-significant drop in BP with transition to upright (symptomatic); gait distance, OOB activity limited as result.  See vitals flowsheet for details.   Assessment/Plan    PT Assessment Patient needs continued PT services  PT Problem List Decreased strength;Decreased activity tolerance;Decreased balance;Decreased range of motion;Decreased mobility;Decreased coordination;Decreased cognition;Decreased knowledge of use of DME;Decreased knowledge of precautions       PT Treatment Interventions DME instruction;Gait training;Functional mobility training;Therapeutic activities;Therapeutic exercise;Balance training;Patient/family education    PT Goals (Current goals can be found in the Care Plan section)  Acute Rehab PT Goals Patient Stated Goal: to return home PT Goal Formulation: With patient Time For Goal Achievement: 03/25/18 Potential to Achieve Goals: Good    Frequency BID   Barriers to discharge        Co-evaluation               AM-PAC PT "6 Clicks" Mobility  Outcome Measure Help needed turning from your back to your side while in a flat bed without using bedrails?: A Little Help needed moving from lying on your back to sitting on the side of a flat bed without using bedrails?: A Little Help needed moving to and from a  bed to a chair (including a wheelchair)?: A Lot Help needed standing up from a chair using your arms (e.g., wheelchair or bedside chair)?: A Lot Help needed to walk in hospital room?: A Lot Help needed climbing 3-5 steps with a railing? : Total 6 Click Score: 13    End of Session Equipment Utilized During Treatment: Gait belt Activity Tolerance: Patient tolerated treatment well Patient left: in chair;with chair alarm set;with family/visitor present;with call bell/phone within reach Nurse Communication: Mobility status PT Visit Diagnosis: Muscle weakness (generalized) (M62.81);Difficulty in walking, not elsewhere classified (R26.2);Pain Pain - Right/Left: Left Pain - part of body: Hip    Time: 3009-2330 PT Time Calculation (min) (ACUTE ONLY): 36 min   Charges:   PT Evaluation $PT Eval Moderate Complexity: 1 Mod PT Treatments $Therapeutic Exercise: 8-22 mins $Therapeutic Activity: 8-22 mins        Chanice Brenton H. Owens Shark, PT, DPT, NCS 03/11/18, 9:29 AM [phone]

## 2018-03-11 NOTE — Anesthesia Postprocedure Evaluation (Signed)
Anesthesia Post Note  Patient: Sarah Dougherty  Procedure(s) Performed: TOTAL HIP REVISION (Left )  Patient location during evaluation: Other Anesthesia Type: Spinal Level of consciousness: awake and alert and oriented Pain management: pain level controlled Vital Signs Assessment: post-procedure vital signs reviewed and stable Respiratory status: spontaneous breathing Cardiovascular status: blood pressure returned to baseline Postop Assessment: no headache and no backache Anesthetic complications: no     Last Vitals:  Vitals:   03/11/18 0300 03/11/18 0741  BP: 130/71 130/65  Pulse: 67 66  Resp: 15 18  Temp: 37 C   SpO2: 98% 93%    Last Pain:  Vitals:   03/11/18 1015  TempSrc:   PainSc: 6                  Adileny Delon

## 2018-03-11 NOTE — Clinical Social Work Note (Signed)
CSW spoke with patient and her son Sarah Dougherty [phone] who was at bedside.  Sarah Dougherty states he would like patient to come home with home health verse going to a SNF.  Patient's son stated that patient is taken care of by himself and his wife, he does not want her to go to SNF because of previous negative experiences.  Patient's son reported that she has been living with her for about 30 years, and they are able to continue to take care of her.  Patient's son stated that she has bedside commode, walker, canes, and lift chair at home, and feels like they are well equipped to have patient return home.  CSW to notify case manager, CSW to sign off.  Jones Broom. Norval Morton, MSW, [place]  03/11/2018 4:44 PM

## 2018-03-12 LAB — URINE CULTURE: Culture: >=100000 — AB

## 2018-03-12 LAB — GLUCOSE, CAPILLARY
Glucose-Capillary: 128 mg/dL — ABNORMAL HIGH (ref 70–99)
Glucose-Capillary: 200 mg/dL — ABNORMAL HIGH (ref 70–99)
Glucose-Capillary: 257 mg/dL — ABNORMAL HIGH (ref 70–99)
Glucose-Capillary: 69 mg/dL — ABNORMAL LOW (ref 70–99)

## 2018-03-12 MED ORDER — SODIUM CHLORIDE 0.9 % IV SOLN
Freq: Once | INTRAVENOUS | Status: AC
  Administered 2018-03-12: 12:00:00 via INTRAVENOUS

## 2018-03-12 MED ORDER — SALINE SPRAY 0.65 % NA SOLN
1.0000 | NASAL | Status: DC | PRN
  Administered 2018-03-12: 1 via NASAL
  Filled 2018-03-12: qty 44

## 2018-03-12 NOTE — Evaluation (Signed)
Occupational Therapy Evaluation Patient Details Name: Sarah Dougherty MRN: [id number] DOB: [birth year] Today's Date: 03/12/2018    History of Present Illness admitted for acute hospitalization status post L total hip revision (03/10/18), WBAT, post THPs.   Clinical Impression   Patient is a 84 yo female who was admitted to [facility] for a left total hip revision.  She lives with her son and has 24 hour care and assistance.  She was modified independent with self care months ago however since her last fall she has required increased assistance at home.  She has adaptive equipment at home including a reacher, sockaid and shoehorn, along with RW, rollator, external female catheter at night, tub bench and elevated toilet.  She now has posterior hip precautions, muscle weakness, decreased transfers/functional mobility and requires increased assistance with all self care tasks including feeding herself since hospital admission.  Recommend follow up OT services at discharge.  She would benefit from SNF however her son would like to take her home to be in her own environment and would benefit from [facility].  She will benefit from skilled OT services during her hospital stay.      Follow Up Recommendations  SNF;Other (comment)(rec SNF however son wants to take patient home, if so HHOT recommended)    Equipment Recommendations       Recommendations for Other Services       Precautions / Restrictions Precautions Precautions: Fall;Posterior Hip Precaution Comments: contact iso Restrictions Weight Bearing Restrictions: Yes LLE Weight Bearing: Weight bearing as tolerated      Mobility Bed Mobility Overal bed mobility: Needs Assistance Bed Mobility: Supine to Sit     Supine to sit: Min assist;Mod assist     General bed mobility comments: for position and protection of L LE  Transfers Overall transfer level: Needs assistance Equipment used: Rolling walker (2 wheeled) Transfers: Sit to/from  Stand Sit to Stand: Mod assist         General transfer comment: limited anterior weight translation/forward trunk lean; constant cuing for hand placement; extensive assist fo rlift off.  Generally tremulous and fearful of movement transition    Balance Overall balance assessment: Needs assistance Sitting-balance support: No upper extremity supported;Feet supported Sitting balance-Leahy Scale: Fair     Standing balance support: Bilateral upper extremity supported Standing balance-Leahy Scale: Poor                             ADL either performed or assessed with clinical judgement   ADL Overall ADL's : Needs assistance/impaired Eating/Feeding: Set up;Minimal assistance Eating/Feeding Details (indicate cue type and reason): Patient requiring some assist with feeding since hospitalization Grooming: Wash/dry hands   Upper Body Bathing: Moderate assistance   Lower Body Bathing: Total assistance   Upper Body Dressing : Moderate assistance   Lower Body Dressing: Total assistance Lower Body Dressing Details (indicate cue type and reason): total hip precautions Toilet Transfer: +2 for physical assistance             General ADL Comments: Patient requiring increased assist in recent weeks with self care tasks and mobility.  Son present to assist with history and prior level of function.  He is aware of current hip precautions and able to state 3/3.  He has all necessary equipment in place at home.       Vision   Additional Comments: Pt has artificial eye on the right side     Perception  Praxis      Pertinent Vitals/Pain Pain Assessment: Faces Pain Score: 4  Faces Pain Scale: Hurts little more Pain Location: L hip Pain Descriptors / Indicators: Aching;Grimacing;Guarding Pain Intervention(s): Limited activity within patient's tolerance;Monitored during session     Hand Dominance Right   Extremity/Trunk Assessment Upper Extremity Assessment Upper  Extremity Assessment: Generalized weakness   Lower Extremity Assessment Lower Extremity Assessment: Defer to PT evaluation       Communication Communication Communication: HOH   Cognition Arousal/Alertness: Lethargic Behavior During Therapy: Anxious Overall Cognitive Status: Within Functional Limits for tasks assessed                                 General Comments: limited insight into deficits and overall medical situation, precautions   General Comments       Exercises   Shoulder Instructions      Home Living Family/patient expects to be discharged to:: Private residence Living Arrangements: Children Available Help at Discharge: Family;Available 24 hours/day Type of Home: House Home Access: Ramped entrance     Home Layout: One level     Bathroom Shower/Tub: Teacher, early years/pre: Standard Bathroom Accessibility: Yes   Home Equipment: Environmental consultant - 2 wheels;Walker - 4 wheels;Cane - single point;Toilet riser;Shower seat Adaptive Equipment: Reacher;Sock aid;Long-handled shoe horn        Prior Functioning/Environment Level of Independence: Independent with assistive device(s)        Comments: Mod indep with RW for transfers and household mobilization; family assists with bathing/dressing, household responsibilties as needed.  Family able to provide 24/7 supervision.        OT Problem List: Decreased strength;Impaired balance (sitting and/or standing);Decreased knowledge of precautions;Decreased cognition;Pain;Decreased range of motion;Decreased safety awareness;Decreased activity tolerance;Decreased knowledge of use of DME or AE      OT Treatment/Interventions: Self-care/ADL training;DME and/or AE instruction;Therapeutic activities;Balance training;Therapeutic exercise;Patient/family education    OT Goals(Current goals can be found in the care plan section) Acute Rehab OT Goals Patient Stated Goal: to get back home in her  environment OT Goal Formulation: With patient/family Time For Goal Achievement: 03/26/18 Potential to Achieve Goals: Good  OT Frequency: Min 1X/week   Barriers to D/C:            Co-evaluation              AM-PAC OT "6 Clicks" Daily Activity     Outcome Measure Help from another person eating meals?: A Little Help from another person taking care of personal grooming?: A Little Help from another person toileting, which includes using toliet, bedpan, or urinal?: A Lot Help from another person bathing (including washing, rinsing, drying)?: Total Help from another person to put on and taking off regular upper body clothing?: A Lot Help from another person to put on and taking off regular lower body clothing?: Total 6 Click Score: 12   End of Session    Activity Tolerance: Patient limited by lethargy Patient left: in chair;with call bell/phone within reach;with family/visitor present  OT Visit Diagnosis: Repeated falls (R29.6);Muscle weakness (generalized) (M62.81);Unsteadiness on feet (R26.81);Feeding difficulties (R63.3);Pain Pain - Right/Left: Left Pain - part of body: Hip                Time: 1100-1117 OT Time Calculation (min): 17 min Charges:  OT General Charges $OT Visit: 1 Visit OT Evaluation $OT Eval Low Complexity: 1 Low  Kemp Gomes T Siyah Mault, OTR/L, CLT  Majed Pellegrin 03/12/2018, 11:33 AM

## 2018-03-12 NOTE — Care Management (Addendum)
Patient suffers from left hip injury which impairs their ability to perform daily activities like toileting, feeding, dressing, grooming, and bathing in the home. A cane, walker, crutch will not resolve issue with performing activities of daily living. A wheelchair will allow patient to safely perform daily activities.  Patient can safely propel the wheelchair in the home or has a caregiver who can provide assistance. Wille Glaser PA updated of this request. Brad with [facility] also notified.

## 2018-03-12 NOTE — Progress Notes (Signed)
Physical Therapy Treatment Patient Details Name: Sarah Dougherty MRN: [id number] DOB: 1922-02-28 Today's Date: 03/12/2018    History of Present Illness admitted for acute hospitalization status post L total hip revision (03/10/18), WBAT, post THPs.    PT Comments    Patient sleeping soundly upon arrival to room.  Opens eyes to verbal cuing, but difficulty maintaining alertness throughout session.  Session emphasis on supine L LE therex for strengthening/ROM; frequent verbal cuing for attention to and participation with task.  Will continue OOB efforts and re-assess orthostatics next session.  Follow Up Recommendations  SNF     Equipment Recommendations       Recommendations for Other Services       Precautions / Restrictions Precautions Precautions: Fall;Posterior Hip Precaution Comments: contact iso Restrictions Weight Bearing Restrictions: Yes LLE Weight Bearing: Weight bearing as tolerated    Mobility  Bed Mobility                  Transfers                    Ambulation/Gait                 Stairs             Wheelchair Mobility    Modified Rankin (Stroke Patients Only)       Balance                                            Cognition Arousal/Alertness: Lethargic Behavior During Therapy: Anxious Overall Cognitive Status: Impaired/Different from baseline                                        Exercises Other Exercises Other Exercises: L LE supine therex, 2x15, act assist ROM: ankle pumps, SAQs, heel slides, hip abduct/adduct.  Frequent verbal cuing for attention to and participation with task.  Increased confusion, lethargy noted; OOB deferred as result.    General Comments        Pertinent Vitals/Pain Pain Assessment: Faces Faces Pain Scale: Hurts even more Pain Location: L hip Pain Descriptors / Indicators: Aching;Grimacing;Guarding Pain Intervention(s): Limited activity  within patient's tolerance;Monitored during session;Repositioned    Home Living                      Prior Function            PT Goals (current goals can now be found in the care plan section) Acute Rehab PT Goals Patient Stated Goal: to get back home in her environment PT Goal Formulation: With patient Time For Goal Achievement: 03/25/18 Potential to Achieve Goals: Good Progress towards PT goals: Progressing toward goals    Frequency    BID      PT Plan Current plan remains appropriate    Co-evaluation              AM-PAC PT "6 Clicks" Mobility   Outcome Measure  Help needed turning from your back to your side while in a flat bed without using bedrails?: Total Help needed moving from lying on your back to sitting on the side of a flat bed without using bedrails?: Total Help needed moving to and from a bed to a chair (including a wheelchair)?:  Total Help needed standing up from a chair using your arms (e.g., wheelchair or bedside chair)?: Total Help needed to walk in hospital room?: Total Help needed climbing 3-5 steps with a railing? : Total 6 Click Score: 6    End of Session   Activity Tolerance: Patient limited by lethargy;Patient limited by pain Patient left: in bed;with call bell/phone within reach;with bed alarm set;with family/visitor present Nurse Communication: Mobility status PT Visit Diagnosis: Muscle weakness (generalized) (M62.81);Difficulty in walking, not elsewhere classified (R26.2);Pain Pain - Right/Left: Left Pain - part of body: Hip     Time: 2542-7062 PT Time Calculation (min) (ACUTE ONLY): 12 min  Charges:  $Therapeutic Exercise: 8-22 mins                     Natahlia Hoggard H. Owens Shark, PT, DPT, NCS 03/12/18, 3:45 PM [phone]

## 2018-03-12 NOTE — Discharge Summary (Addendum)
Physician Discharge Summary  Patient ID: Sarah Dougherty MRN: [id number] DOB/AGE: Sep 01, 1921 84 y.o.  Admit date: 03/10/2018 Discharge date: 03/13/2018  Admission Diagnoses:  closed displaced intertrochanteric fracture of left femur   Discharge Diagnoses: Patient Active Problem List   Diagnosis Date Noted  . Hip fracture ([place]) 01/24/2018  . Chronic kidney disease, stage 3 ([place]) 01/13/2018  . GERD (gastroesophageal reflux disease) 01/13/2018  . Hyperlipidemia 01/13/2018  . Hypertension 01/13/2018  . Osteoarthritis of knees, bilateral 01/13/2018  . 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 Duodenal mass   . Abdominal pain   . Pancreatitis 01/05/2018  . S/P hip hemiarthroplasty 08/16/2017  . Age-related osteoporosis with current pathological fracture with routine healing 04/10/2017  . Chronic atrial fibrillation 04/10/2017  . Hip fx ([place]) 04/03/2017  . 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 COPD exacerbation ([place]) 04/07/2016  . Squamous cell carcinoma 03/19/2016  . Bradycardia 12/10/2014  . COPD (chronic obstructive pulmonary disease) ([place]) 12/09/2014  . Asthma 02/17/2014  . Congestive heart failure ([place]) 02/17/2014  . Chronic respiratory failure with hypoxia ([place]) 11/09/2013  . Benign essential hypertension 12/07/2006  . Type 2 diabetes mellitus ([place]) 12/07/2006    Past Medical History:  Diagnosis Date  . A-fib ([place])   . Arthritis    Osteoarthritis  . Asthma   . Cancer (HCC)    PAROTID GLAND  . CHF (congestive heart failure) ([place])   . Chronic kidney disease     / CKD STAGE 3  . COPD (chronic obstructive pulmonary disease) ([place])   . Coronary artery disease   . Diabetes mellitus without complication ([place])   . Dysrhythmia    CHRONIC AFIB  . GERD (gastroesophageal reflux disease)   . Glaucoma (increased eye pressure)   . Hip fracture ([place]) 01/24/2018  . HOH (hard of hearing)    RIGHT HEARING AID  . Hypertension   . Hyperthyroidism   .  Presence of permanent cardiac pacemaker   . Sleep apnea    No C-PAP/ NO LONGER WEARS OXYGEN     Transfusion: No transfusions during this admission   Consultants (if any):   Discharged Condition: Improved  Hospital Course: Sarah Dougherty is an 84 y.o. 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 M.D.  ANESTHESIA: spinal and general  ESTIMATED BLOOD LOSS: 150 mL  FLUIDS REPLACED: 1100 mL of crystalloid  DRAINS: 2 medium drains to a Hemovac reservoir  IMPLANTS UTILIZED: DePuy 13.5 mm small stature AML femoral stem, 44 mm OD Cathcart hip ball, -3 mm tapered spacer  INDICATIONS FOR SURGERY: Sarah Dougherty is a 84 y.o. year old female who underwent ORIF of a left intertrochanteric femur fracture approximately 6 weeks ago.  Upon follow-up, radiographs demonstrated evidence of nonunion with cut out of the helical blade.  After discussion of the risks and benefits of surgical intervention, the patient expressed understanding of the risks benefits and agree with plans for hardware removal and conversion to hip hemiarthroplasty.   The risks, benefits, and alternatives were discussed at length including but not limited to the risks of infection,  bleeding, nerve injury, stiffness, blood clots, the need for revision surgery, limb length inequality, dislocation, cardiopulmonary complications, among others, and they were willing to proceed.  Patient tolerated the surgery well. No complications .Patient  was taken to PACU where she was stabilized and then transferred to the orthopedic floor.  Patient started on Eliquis. Foot pumps applied bilaterally at 80 mm hgb. Heels elevated off bed with rolled towels. No evidence of DVT. Calves non tender. Negative Homan. Physical therapy started on day #1 for gait training and transfer with OT starting on  day #1 for ADL and assisted devices. Patient has done well with therapy. Ambulated minimally upon being discharged. Son would like for his mother to go home.  States that he is taking care of her for 30 years.  States that he went to this last year and is able to do so.  Patient's IV And Foley were discontinued on day #1 with Hemovac being discontinued on day #2. Dressing was changed on day 2 prior to patient being discharged  On postop day 3, patient was stable and ready for discharge to home with family members.  Patient will have home health PT at home   She was given perioperative antibiotics:  Anti-infectives (From admission, onward)   Start     Dose/Rate Route Frequency Ordered Stop   03/10/18 1730  clindamycin (CLEOCIN) IVPB 600 mg     600 mg 100 mL/hr over 30 Minutes Intravenous Every 6 hours 03/10/18 1617 03/10/18 2343   03/10/18 0954  clindamycin (CLEOCIN) 900 MG/50ML IVPB    Note to Pharmacy:  Josephina Shih   : cabinet override      03/10/18 0954 03/10/18 1155   03/10/18 0915  clindamycin (CLEOCIN) IVPB 900 mg     900 mg 100 mL/hr over 30 Minutes Intravenous On call to O.R. 03/10/18 0177 03/10/18 1205    .  She was fitted with AV 1 compression foot pump devices, instructed on heel pumps, early ambulation, and fitted with TED stockings bilaterally for DVT prophylaxis.  She benefited maximally from the hospital stay and there were no complications.    Recent vital signs:  Vitals:   03/12/18 1442 03/12/18 2323  BP: (!) 112/52 (!) 101/44  Pulse: 60 (!) 59  Resp:  19  Temp: 97.8 F (36.6 C) 98 F (36.7 C)  SpO2: 92% 94%     Recent laboratory studies:  Lab Results  Component Value Date   HGB 10.6 (L) 03/10/2018   HGB 9.5 (L) 01/28/2018   HGB 9.4 (L) 01/27/2018   Lab Results  Component Value Date   WBC 10.4 03/10/2018   PLT 394 03/10/2018   Lab Results  Component Value Date   INR 1.08 03/10/2018   Lab Results  Component Value Date   NA 140 03/10/2018   K 3.7 03/10/2018   CL 103 03/10/2018   CO2 25 03/10/2018   BUN 16 03/10/2018   CREATININE 0.70 03/10/2018   GLUCOSE 165 (H) 03/10/2018    Discharge Medications:   Allergies as of 03/13/2018      Reactions   Amoxicillin Other (See Comments)   unknown   Cephalexin Other (See Comments)   unknown   Nitrofurantoin Other (See Comments)    Unknown   Penicillins Other (See Comments)   Other reaction(s): Unknown   Sulfa Antibiotics Hives, Itching   Atorvastatin    Other reaction(s): Muscle Pain   Azithromycin Rash      Medication List    TAKE these  medications   albuterol 108 (90 Base) MCG/ACT inhaler Commonly known as:  PROVENTIL HFA;VENTOLIN HFA Inhale 1 puff into the lungs every 6 (six) hours as needed for wheezing or shortness of breath.   amiodarone 100 MG tablet Commonly known as:  PACERONE Take 1 tablet (100 mg total) by mouth daily.   amLODipine 5 MG tablet Commonly known as:  NORVASC Take 1 tablet by mouth daily.   apixaban 2.5 MG Tabs tablet Commonly known as:  ELIQUIS Take 1 tablet (2.5 mg total) by mouth 2 (two) times daily.   brimonidine 0.2 % ophthalmic solution Commonly known as:  ALPHAGAN Place 1 drop into the left eye 2 (two) times daily.   CRANBERRY PO Take 1 capsule by mouth daily.   DERMACLOUD Crea Apply liberal amount to area of skin irritation every shift.  Okay to leave at bedside   glimepiride 2 MG tablet Commonly known as:  AMARYL Take 1 tablet (2 mg total) by mouth daily.   metroNIDAZOLE 0.75 % vaginal gel Commonly known as:  METROGEL Place 1 Applicatorful vaginally at bedtime.    oxyCODONE 5 MG immediate release tablet Commonly known as:  Oxy IR/ROXICODONE Take 1 tablet (5 mg total) by mouth every 4 (four) hours as needed for moderate pain (pain score 4-6).   sodium chloride 0.65 % nasal spray Commonly known as:  OCEAN Place 2 sprays into the nose daily as needed.   traMADol 50 MG tablet Commonly known as:  ULTRAM Take 1-2 tablets (50-100 mg total) by mouth every 4 (four) hours as needed for moderate pain.   travoprost (benzalkonium) 0.004 % ophthalmic solution Commonly known as:  TRAVATAN Place 1 drop into the left eye at bedtime.   TYLENOL 8 HOUR 650 MG CR tablet Generic drug:  acetaminophen Take 650 mg by mouth every 8 (eight) hours as needed for pain.   Vitamin D 50 MCG (2000 UT) tablet Take 2,000 Units by mouth daily.            Durable Medical Equipment  (From admission, onward)         Start     Ordered   03/12/18 1026  For home use only DME standard manual wheelchair with seat cushion  Once     03/12/18 1026   03/12/18 1014  For home use only DME standard manual wheelchair with seat cushion  Once    Comments:  Patient suffers from left hip injury which impairs their ability to perform daily activities like toileting, feeding, dressing, grooming, and bathing in the home. A cane, walker, crutch will not resolve issue with performing activities of daily living. A wheelchair will allow patient to safely perform daily activities.  Patient can safely propel the wheelchair in the home or has a caregiver who can provide assistance.  Accessories: elevating leg rests (ELRs), wheel locks, extensions and anti-tippers.   03/12/18 1014          Diagnostic Studies: Dg Hip Port Unilat With Pelvis 1v Left  Result Date: 03/10/2018 CLINICAL DATA:  Postop EXAM: DG HIP (WITH OR WITHOUT PELVIS) 1V PORT LEFT COMPARISON:  CT 01/24/2018, radiograph 01/24/2018, 04/07/2017 FINDINGS: Prior right hip replacement. Normal alignment. Chronic widening of the pubic  symphysis. Interval left hip replacement with normal alignment. Surgical drain and cutaneous staples over the left hip. Small soft tissue emphysema. IMPRESSION: Interval left hip replacement with intact hardware and expected postsurgical changes. Chronic widened appearance of the pubic symphysis. Electronically Signed   By: Madie Reno.D.  On: 03/10/2018 15:25    Disposition:    Discharge Instructions    Increase activity slowly   Complete by:  As directed       Follow-up Information    Hooten, Laurice Record, MD On 04/22/2018.   Specialty:  Orthopedic Surgery Why:  at 2:30pm Contact information: [place] [address] [phone]            Signed: Feliberto Gottron 03/13/2018, 8:55 AM

## 2018-03-12 NOTE — Care Management Note (Signed)
Case Management Note  Patient Details  Name: Sarah Dougherty MRN: [id number] Date of Birth: March 31, 1922  Subjective/Objective:                  RNCM spoke with patient's son and he plans to take patient home today with resumption of care through [facility] home health.  He would like for them to visit on Monday. She has a rollator, bedside commode, female urinal with suction, and wheelchair.  She will have assistance in the home per son.   Action/Plan: [place] with [facility] updated on patient's discharge to home today.   Expected Discharge Date:  03/12/18               Expected Discharge Plan:  [place]  In-House Referral:     Discharge planning Services  CM Consult  Post Acute Care Choice:  Home Health, Resumption of Svcs/PTA Provider Choice offered to:  Adult Children  DME Arranged:    DME Agency:     HH Arranged:  PT HH Agency:  [facility]  Status of Service:  Completed, signed off  If discussed at [place] of Stay Meetings, dates discussed:    Additional Comments:  Marshell Garfinkel, RN 03/12/2018, 8:32 AM

## 2018-03-12 NOTE — Clinical Social Work Note (Signed)
Clinical Social Work Assessment  Patient Details  Name: Sarah Dougherty MRN: [id number] Date of Birth: [birth year]  Date of referral:  03/12/18               Reason for consult:  Facility Placement                Permission sought to share information with:  Case Manager Permission granted to share information::  Yes, Verbal Permission Granted  Name::        Agency::     Relationship::     Contact Information:     Housing/Transportation Living arrangements for the past 2 months:  Single Family Home Source of Information:  Adult Children Patient Interpreter Needed:  None Criminal Activity/Legal Involvement Pertinent to Current Situation/Hospitalization:  No - Comment as needed Significant Relationships:  Adult Children Lives with:  Adult Children Do you feel safe going back to the place where you live?  Yes Need for family participation in patient care:  Yes (Comment)  Care giving concerns:  Patient lives in [place] with her son [name].    Social Worker assessment / plan:  Holiday representative (CSW) reviewed chart and noted that PT is recommending SNF. CSW met with patient and her son [name] was at bedside. Patient was asleep and did not participate in assessment. Per son patient lives with him and his wife in [place]. CSW explained SNF process. Per son he feels comfortable taking care of patient at home and has watched her PT sessions here at [facility]. Son reported that he can provide 24/7 care and patient is close to her baseline. Per son patient did not walk much at home prior to this surgery. Son requested resumption of services with [facility] home health. RN case manager aware of above.   Employment status:  Retired, Disabled (Comment on whether or not currently receiving Disability) Insurance information:  Programmer, applications PT Recommendations:  [place] / Referral to community resources:  Other (Comment Required)(Patient's son [name] prefers to take  patient home. )  Patient/Family's Response to care:  Patient's son [name] declined SNF.   Patient/Family's Understanding of and Emotional Response to Diagnosis, Current Treatment, and Prognosis:  Patient's son [name] was very pleasant and thanked CSW for assistance.   Emotional Assessment Appearance:  Appears stated age Attitude/Demeanor/Rapport:  Unable to Assess Affect (typically observed):  Unable to Assess Orientation:  Oriented to Self, Oriented to Place, Fluctuating Orientation (Suspected and/or reported Sundowners) Alcohol / Substance use:  Not Applicable Psych involvement (Current and /or in the community):  No (Comment)  Discharge Needs  Concerns to be addressed:  Discharge Planning Concerns Readmission within the last 30 days:  No Current discharge risk:  Dependent with Mobility Barriers to Discharge:  Continued Medical Work up   [employer], Veronia Beets, LCSW 03/12/2018, 4:03 PM

## 2018-03-12 NOTE — Plan of Care (Signed)
  Problem: Activity: Goal: Risk for activity intolerance will decrease Outcome: Progressing   Problem: Nutrition: Goal: Adequate nutrition will be maintained Outcome: Progressing   Problem: Elimination: Goal: Will not experience complications related to bowel motility Outcome: Progressing   Problem: Pain Managment: Goal: General experience of comfort will improve Outcome: Progressing   Problem: Skin Integrity: Goal: Risk for impaired skin integrity will decrease Outcome: Progressing   

## 2018-03-12 NOTE — NC FL2 (Signed)
[place] LEVEL OF CARE SCREENING TOOL     IDENTIFICATION  Patient Name: Sarah Dougherty Birthdate: January 11, 1922 Sex: female Admission Date (Current Location): 03/10/2018  [place] and [place] Number:  Engineering geologist and Address:  [facility], [address] [place], [place] [address]      Provider Number: [id number]  Attending Physician Name and Address:  Dereck Leep, MD  Relative Name and Phone Number:       Current Level of Care: Hospital Recommended Level of Care: [place] Prior Approval Number:    Date Approved/Denied:   PASRR Number: ([id number])  Discharge Plan: SNF    Current Diagnoses: Patient Active Problem List   Diagnosis Date Noted  . Hip fracture ([place]) 01/24/2018  . Chronic kidney disease, stage 3 ([place]) 01/13/2018  . GERD (gastroesophageal reflux disease) 01/13/2018  . Hyperlipidemia 01/13/2018  . Hypertension 01/13/2018  . Osteoarthritis of knees, bilateral 01/13/2018  . 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 Duodenal mass   . Abdominal pain   . Pancreatitis 01/05/2018  . S/P hip hemiarthroplasty 08/16/2017  . Age-related osteoporosis with current pathological fracture with routine healing 04/10/2017  . Chronic atrial fibrillation 04/10/2017  . Hip fx ([place]) 04/03/2017  . 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 COPD exacerbation ([place]) 04/07/2016  . Squamous cell carcinoma 03/19/2016  . Bradycardia 12/10/2014  . COPD (chronic obstructive pulmonary disease) ([place]) 12/09/2014  . Asthma 02/17/2014  . Congestive heart failure ([place]) 02/17/2014  . Chronic respiratory failure with hypoxia ([place]) 11/09/2013  . Benign essential hypertension 12/07/2006  . Type 2 diabetes mellitus ([place]) 12/07/2006    Orientation RESPIRATION BLADDER Height & Weight     Self, Time, Situation, Place  Normal Incontinent Weight: 99 lb 3.3 oz (45 kg) Height:  4\' 10"  (147.3 cm)  BEHAVIORAL  SYMPTOMS/MOOD NEUROLOGICAL BOWEL NUTRITION STATUS      Continent Diet(Diet: Regular )  AMBULATORY STATUS COMMUNICATION OF NEEDS Skin   Extensive Assist Verbally Surgical wounds(Incision: Left Leg. )                       Personal Care Assistance Level of Assistance  Bathing, Feeding, Dressing Bathing Assistance: Limited assistance Feeding assistance: Independent Dressing Assistance: Limited assistance     Functional Limitations Info  Sight, Hearing, Speech Sight Info: Adequate Hearing Info: Impaired Speech Info: Adequate    SPECIAL CARE FACTORS FREQUENCY  PT (By licensed PT), OT (By licensed OT)     PT Frequency: (5) OT Frequency: (5)            Contractures      Additional Factors Info  Code Status, Allergies, Isolation Precautions Code Status Info: (Full Code. ) Allergies Info: (Amoxicillin, Cephalexin, Nitrofurantoin, Penicillins, Sulfa Antibiotics, Atorvastatin, Azithromycin)     Isolation Precautions Info: (E coli/ ESBL in Urine. )     Current Medications (03/12/2018):  This is the current hospital active medication list Current Facility-Administered Medications  Medication Dose Route Frequency Provider Last Rate Last Dose  . 0.9 %  sodium chloride infusion   Intravenous Continuous Hooten, Laurice Record, MD   Stopped at 03/11/18 1401  . acetaminophen (TYLENOL) tablet 325-650 mg  325-650 mg Oral Q6H PRN Dereck Leep, MD   325 mg at 03/12/18 0004  . albuterol (PROVENTIL) (2.5 MG/3ML) 0.083% nebulizer solution 3 mL  3 mL Inhalation Q6H PRN Hooten, Laurice Record, MD      . amiodarone (PACERONE) tablet 100 mg  100 mg Oral Daily Dereck Leep, MD   100 mg at 03/12/18 5638  . amLODipine (NORVASC) tablet 5 mg  5 mg Oral Daily Hooten, Laurice Record, MD   5 mg at 03/12/18 7564  . apixaban (ELIQUIS) tablet 2.5 mg  2.5 mg Oral BID Dereck Leep, MD   2.5 mg at 03/12/18 3329  . bisacodyl (DULCOLAX) suppository 10 mg  10 mg Rectal Daily PRN Dereck Leep, MD   10 mg at 03/12/18  1441  . brimonidine (ALPHAGAN) 0.2 % ophthalmic solution 1 drop  1 drop Left Eye BID Hooten, Laurice Record, MD   1 drop at 03/12/18 5188  . cholecalciferol (VITAMIN D3) tablet 2,000 Units  2,000 Units Oral Daily Dereck Leep, MD   2,000 Units at 03/12/18 0820  . ferrous sulfate tablet 325 mg  325 mg Oral BID WC Dereck Leep, MD   325 mg at 03/12/18 4166  . glimepiride (AMARYL) tablet 2 mg  2 mg Oral Daily Hooten, Laurice Record, MD   2 mg at 03/12/18 0630  . HYDROmorphone (DILAUDID) injection 0.5-1 mg  0.5-1 mg Intravenous Q4H PRN Dereck Leep, MD   0.5 mg at 03/11/18 2109  . insulin aspart (novoLOG) injection 0-15 Units  0-15 Units Subcutaneous TID WC Hooten, Laurice Record, MD   2 Units at 03/11/18 1718  . magnesium hydroxide (MILK OF MAGNESIA) suspension 30 mL  30 mL Oral Daily PRN Hooten, Laurice Record, MD      . menthol-cetylpyridinium (CEPACOL) lozenge 3 mg  1 lozenge Oral PRN Hooten, Laurice Record, MD       Or  . phenol (CHLORASEPTIC) mouth spray 1 spray  1 spray Mouth/Throat PRN Hooten, Laurice Record, MD      . metoCLOPramide (REGLAN) tablet 5-10 mg  5-10 mg Oral Q8H PRN Hooten, Laurice Record, MD       Or  . metoCLOPramide (REGLAN) injection 5-10 mg  5-10 mg Intravenous Q8H PRN Hooten, Laurice Record, MD      . metoCLOPramide (REGLAN) tablet 10 mg  10 mg Oral TID AC & HS Hooten, Laurice Record, MD   10 mg at 03/12/18 0821  . metroNIDAZOLE (METROGEL) 0.75 % vaginal gel 1 Applicatorful  1 Applicatorful Vaginal QHS Hooten, Laurice Record, MD      . ondansetron (ZOFRAN) tablet 4 mg  4 mg Oral Q6H PRN Hooten, Laurice Record, MD       Or  . ondansetron (ZOFRAN) injection 4 mg  4 mg Intravenous Q6H PRN Hooten, Laurice Record, MD   4 mg at 03/12/18 0956  . oxyCODONE (Oxy IR/ROXICODONE) immediate release tablet 10 mg  10 mg Oral Q4H PRN Dereck Leep, MD   10 mg at 03/12/18 0232  . oxyCODONE (Oxy IR/ROXICODONE) immediate release tablet 5 mg  5 mg Oral Q4H PRN Hooten, Laurice Record, MD      . pantoprazole (PROTONIX) EC tablet 40 mg  40 mg Oral BID Dereck Leep, MD    40 mg at 03/12/18 0820  . senna-docusate (Senokot-S) tablet 1 tablet  1 tablet Oral BID Dereck Leep, MD   1 tablet at 03/12/18 0820  . sodium chloride (OCEAN) 0.65 % nasal spray 2 spray  2 spray Nasal Daily PRN Hooten, Laurice Record, MD   2 spray at 03/12/18 0547  . sodium phosphate (FLEET) 7-19 GM/118ML enema 1 enema  1 enema Rectal Once PRN Hooten, Laurice Record, MD      . traMADol Veatrice Bourbon) tablet 50-100  mg  50-100 mg Oral Q4H PRN Dereck Leep, MD   50 mg at 03/12/18 1139  . Travoprost (BAK Free) (TRAVATAN) 0.004 % ophthalmic solution SOLN 1 drop  1 drop Left Eye QHS Hooten, Laurice Record, MD   1 drop at 03/11/18 2108     Discharge Medications: Please see discharge summary for a list of discharge medications.  Relevant Imaging Results:  Relevant Lab Results:   Additional Information (SSN: [id number])  Koa Zoeller, Veronia Beets, LCSW

## 2018-03-12 NOTE — Care Management Important Message (Signed)
Important Message  Patient Details  Name: Sarah Dougherty MRN: [id number] Date of Birth: [birth year]   Medicare Important Message Given:  Yes(given to son [name])    Marshell Garfinkel, RN 03/12/2018, 10:40 AM

## 2018-03-12 NOTE — Care Management (Signed)
Message sent to [place] with [facility] requesting home health to see her before Monday per PT concern with progression. She will also need a wheelchair which has been requested from MD and [facility] with [facility].

## 2018-03-12 NOTE — Progress Notes (Signed)
Spoke to Dr. Marry Guan regarding patient's orthostatic blood pressures this morning. He asked this nurse to give patient 256mL bolus and to cancel patient's discharge.

## 2018-03-12 NOTE — Progress Notes (Signed)
Physical Therapy Treatment Patient Details Name: Sarah Dougherty MRN: [id number] DOB: 05-27-1921 Today's Date: 03/12/2018    History of Present Illness admitted for acute hospitalization status post L total hip revision (03/10/18), WBAT, post THPs.    PT Comments    Patient generally anxious/fearful of all functional activities, requiring consistent mod assist from therapist at all times.  Limited insight/awareness into deficits and overall safety needs noted. Remains orthostatic with transition to upright; unable to recover despite accommodation to position or introduction of exertional activity.  Distance/activity limited as result.  RN informed/aware. Will require +1 hands-on assist at all times for all functional activities; recommend limiting to essential mobility needs only outside of therapy at this time to minimize fall risk.  May consider use of manual WC for optimal safety.   Patient suffers from L hip THR revision which impairs his/her ability to perform daily activities like toileting, feeding, dressing, grooming, bathing in the home. A cane, walker, crutch will not resolve the patient's issue with performing activities of daily living. A wheelchair is required/recommended and will allow patient to safely perform daily activities.   Patient can safely propel the wheelchair in the home or has a caregiver who can provide assistance.     Follow Up Recommendations  SNF     Equipment Recommendations       Recommendations for Other Services       Precautions / Restrictions Precautions Precautions: Fall;Posterior Hip Precaution Comments: contact iso Restrictions Weight Bearing Restrictions: Yes LLE Weight Bearing: Weight bearing as tolerated    Mobility  Bed Mobility Overal bed mobility: Needs Assistance Bed Mobility: Supine to Sit     Supine to sit: Min assist;Mod assist     General bed mobility comments: for position and protection of L LE  Transfers Overall  transfer level: Needs assistance Equipment used: Rolling walker (2 wheeled) Transfers: Sit to/from Stand Sit to Stand: Mod assist         General transfer comment: limited anterior weight translation/forward trunk lean; constant cuing for hand placement; extensive assist fo rlift off.  Generally tremulous and fearful of movement transition  Ambulation/Gait Ambulation/Gait assistance: Min assist;Mod assist;+2 safety/equipment Gait Distance (Feet): 12 Feet Assistive device: Rolling walker (2 wheeled)       General Gait Details: 3-point, step to gait pattern with decreaed stance time L LE; min assist for walker management. Impulsive, tends to pick walker up from ground.  Poor balance; high fall risk.  Continues to be limited by symptomatic orthostasis.   Stairs             Wheelchair Mobility    Modified Rankin (Stroke Patients Only)       Balance Overall balance assessment: Needs assistance Sitting-balance support: No upper extremity supported;Feet supported Sitting balance-Leahy Scale: Fair     Standing balance support: Bilateral upper extremity supported Standing balance-Leahy Scale: Poor                              Cognition Arousal/Alertness: Awake/alert Behavior During Therapy: Anxious Overall Cognitive Status: Within Functional Limits for tasks assessed                                 General Comments: limited insight into deficits and overall medical situation, precautions      Exercises Other Exercises Other Exercises: Sit/stand x3 with RW, mod assist for forward trunk  lean/anterior weight shift, lift off and static standing balance.  Generally tremulous and fearful of standing activities. Other Exercises: Remains orthostatic with transition to upright; unable to recover despite accommodation to position or introduction of exertional activity.  Distance/activity limited as result.  RN informed/aware. Other Exercises: Issued  handout with written/pictorial descriptions of supine LE therex for use as HEP; reviewed posterior hip precautions and functional implications.  Son voiced understanding; will continue to reinforce as needed throughout remaining stay.  Also discussed strategies for management of symptomatic orthostasis (slow, incremental position changes); son voiced understanding, stating "she does that at home sometimes" and voices plan for managing.    General Comments        Pertinent Vitals/Pain Pain Assessment: Faces Faces Pain Scale: Hurts little more Pain Location: L hip Pain Descriptors / Indicators: Aching;Grimacing;Guarding Pain Intervention(s): Limited activity within patient's tolerance;Monitored during session;Repositioned    Home Living                      Prior Function            PT Goals (current goals can now be found in the care plan section) Acute Rehab PT Goals Patient Stated Goal: to return home PT Goal Formulation: With patient Time For Goal Achievement: 03/25/18 Potential to Achieve Goals: Good Progress towards PT goals: Progressing toward goals    Frequency    BID      PT Plan Current plan remains appropriate    Co-evaluation              AM-PAC PT "6 Clicks" Mobility   Outcome Measure  Help needed turning from your back to your side while in a flat bed without using bedrails?: A Lot Help needed moving from lying on your back to sitting on the side of a flat bed without using bedrails?: A Lot Help needed moving to and from a bed to a chair (including a wheelchair)?: A Lot Help needed standing up from a chair using your arms (e.g., wheelchair or bedside chair)?: A Lot Help needed to walk in hospital room?: A Lot Help needed climbing 3-5 steps with a railing? : Total 6 Click Score: 11    End of Session Equipment Utilized During Treatment: Gait belt Activity Tolerance: Treatment limited secondary to medical complications (Comment)(symptomatic  orthostasis) Patient left: in chair;with chair alarm set;with call bell/phone within reach Nurse Communication: Mobility status PT Visit Diagnosis: Muscle weakness (generalized) (M62.81);Difficulty in walking, not elsewhere classified (R26.2);Pain Pain - Right/Left: Left Pain - part of body: Hip     Time: 2992-4268 PT Time Calculation (min) (ACUTE ONLY): 38 min  Charges:  $Gait Training: 8-22 mins $Therapeutic Activity: 23-37 mins                     Danila Eddie H. Owens Shark, PT, DPT, NCS 03/12/18, 9:47 AM [phone]

## 2018-03-12 NOTE — Progress Notes (Signed)
PT Cancellation Note  Patient Details Name: Sarah Dougherty MRN: [id number] DOB: Apr 25, 1921   Cancelled Treatment:    Reason Eval/Treat Not Completed: (Patient sleeping soundly upon arrival to room.  Unable to awaken, maintain arousal, for participation with session this AM. Will continue efforts at later time this AM.)   Elandra Powell H. Owens Shark, PT, DPT, NCS 03/12/18, 8:40 AM [phone]

## 2018-03-12 NOTE — Progress Notes (Addendum)
Subjective: 2 Days Post-Op Procedure(s) (LRB): TOTAL HIP REVISION (Left) Patient reports pain as mild.   Patient is well, and has had no acute complaints or problems Minimal ambulation yesterday with therapy. Plan is to go Home after hospital stay. no nausea and no vomiting Patient denies any chest pains or shortness of breath.  Son still adamant about patient going home.  He states that he has been taking care of her and can do this himself. Objective: Vital signs in last 24 hours: Temp:  [97.7 F (36.5 C)] 97.7 F (36.5 C) (11/28 1630) Pulse Rate:  [64-66] 64 (11/28 1630) Resp:  [16-18] 16 (11/28 1630) BP: (129-130)/(62-65) 129/62 (11/28 1630) SpO2:  [92 %-93 %] 92 % (11/28 1630) well approximated incision Heels are non tender and elevated off the bed using rolled towels Intake/Output from previous day: 11/28 0701 - 11/29 0700 In: 1303.5 [P.O.:360; I.V.:743.5; IV Piggyback:200] Out: -  Intake/Output this shift: No intake/output data recorded.  Recent Labs    03/10/18 0931  HGB 10.6*   Recent Labs    03/10/18 0931  WBC 10.4  RBC 3.90  HCT 35.7*  PLT 394   Recent Labs    03/10/18 0931  NA 140  K 3.7  CL 103  CO2 25  BUN 16  CREATININE 0.70  GLUCOSE 165*  CALCIUM 8.6*   Recent Labs    03/10/18 0931  INR 1.08    EXAM General - Patient is Alert, Appropriate and Oriented Extremity - Neurologically intact Neurovascular intact Sensation intact distally Intact pulses distally Dorsiflexion/Plantar flexion intact No cellulitis present Compartment soft Dressing - moderate drainage Motor Function - intact, moving foot and toes well on exam.    Past Medical History:  Diagnosis Date  . A-fib ([place])   . Arthritis    Osteoarthritis  . Asthma   . Cancer (HCC)    PAROTID GLAND  . CHF (congestive heart failure) ([place])   . Chronic kidney disease     / CKD STAGE 3  . COPD (chronic obstructive pulmonary disease) ([place])   . Coronary artery disease   .  Diabetes mellitus without complication ([place])   . Dysrhythmia    CHRONIC AFIB  . GERD (gastroesophageal reflux disease)   . Glaucoma (increased eye pressure)   . Hip fracture ([place]) 01/24/2018  . HOH (hard of hearing)    RIGHT HEARING AID  . Hypertension   . Hyperthyroidism   . Presence of permanent cardiac pacemaker   . Sleep apnea    No C-PAP/ NO LONGER WEARS OXYGEN    Assessment/Plan: 2 Days Post-Op Procedure(s) (LRB): TOTAL HIP REVISION (Left) Active Problems:   S/P hip hemiarthroplasty  Estimated body mass index is 20.73 kg/m as calculated from the following:   Height as of this encounter: 4\' 10"  (1.473 m).   Weight as of this encounter: 45 kg. Up with therapy Discharge home with home health    Labs: None DVT Prophylaxis - Foot Pumps, TED hose and Eliquis Weight-Bearing as tolerated to left leg Hemovac was discontinued today.  Ends of the drain appeared to be intact Patient may be discharged home after therapy this morning. Please change dressing prior to being discharged Please give the patient 2 extra honeycomb dressings to take home    Patient suffers from left hip hemiarthroplasty which impairs their ability to perform daily activities like bathing, dressing, feeding, grooming and toileting in the home.  A cane, crutch or walker will not resolve  issue with performing activities  of daily living. A wheelchair will allow patient to safely perform daily activities. Patient can safely propel the wheelchair in the home or has a caregiver who can provide assistance.  Accessories: elevating leg rests (ELRs), wheel locks, extensions and anti-tippers.   [name]. [place] 03/12/2018, 7:05 AM

## 2018-03-12 NOTE — Care Management (Signed)
RNCM spoke with RN and patient's son [name] regarding wheelchair and IM.  Discharge has been cancelled by MD regarding blood pressure symptoms.

## 2018-03-13 LAB — GLUCOSE, CAPILLARY
Glucose-Capillary: 157 mg/dL — ABNORMAL HIGH (ref 70–99)
Glucose-Capillary: 208 mg/dL — ABNORMAL HIGH (ref 70–99)

## 2018-03-13 NOTE — Care Management Note (Signed)
Case Management Note  Patient Details  Name: Sarah Dougherty MRN: [id number] Date of Birth: 1921/10/29  Subjective/Objective:   Patient to be discharged per MD order. Orders in place for home health services. Previous RNCM had completed discharge workup for Home health and DME. [facility] to be used for home health and per Leroy Sea from [place] care they will be able to provide a wheelchair via home delivery.                   Action/Plan:   Expected Discharge Date:  03/13/18               Expected Discharge Plan:  [place]  In-House Referral:     Discharge planning Services  CM Consult  Post Acute Care Choice:  Home Health, Resumption of Svcs/PTA Provider Choice offered to:  Adult Children  DME Arranged:  Programmer, multimedia DME Agency:  [place] Arranged:  PT, RN, Nurse's Aide [place] Agency:  [facility]  Status of Service:  Completed, signed off  If discussed at [place] of Stay Meetings, dates discussed:    Additional Comments:  Sarah Drown Gavrielle Streck, RN 03/13/2018, 11:19 AM

## 2018-03-13 NOTE — Progress Notes (Signed)
   Subjective: 3 Days Post-Op Procedure(s) (LRB): TOTAL HIP REVISION (Left) Patient reports pain as mild.   Patient is well, and has had no acute complaints or problems Denies any CP, SOB, ABD pain. We will continue therapy today.  Plan is to go Home after hospital stay.  Objective: Vital signs in last 24 hours: Temp:  [97.8 F (36.6 C)-98 F (36.7 C)] 98 F (36.7 C) (11/29 2323) Pulse Rate:  [59-60] 59 (11/29 2323) Resp:  [19] 19 (11/29 2323) BP: (101-112)/(44-52) 101/44 (11/29 2323) SpO2:  [92 %-94 %] 94 % (11/29 2323)  Intake/Output from previous day: No intake/output data recorded. Intake/Output this shift: No intake/output data recorded.  Recent Labs    03/10/18 0931  HGB 10.6*   Recent Labs    03/10/18 0931  WBC 10.4  RBC 3.90  HCT 35.7*  PLT 394   Recent Labs    03/10/18 0931  NA 140  K 3.7  CL 103  CO2 25  BUN 16  CREATININE 0.70  GLUCOSE 165*  CALCIUM 8.6*   Recent Labs    03/10/18 0931  INR 1.08    EXAM General - Patient is Alert, Appropriate and Oriented Extremity - Neurovascular intact Sensation intact distally Intact pulses distally Dorsiflexion/Plantar flexion intact No cellulitis present Compartment soft  ABDOMEN - soft non tender, non distended Dressing - dressing C/D/I and no drainage Motor Function - intact, moving foot and toes well on exam.   Past Medical History:  Diagnosis Date  . A-fib ([place])   . Arthritis    Osteoarthritis  . Asthma   . Cancer (HCC)    PAROTID GLAND  . CHF (congestive heart failure) ([place])   . Chronic kidney disease     / CKD STAGE 3  . COPD (chronic obstructive pulmonary disease) ([place])   . Coronary artery disease   . Diabetes mellitus without complication ([place])   . Dysrhythmia    CHRONIC AFIB  . GERD (gastroesophageal reflux disease)   . Glaucoma (increased eye pressure)   . Hip fracture ([place]) 01/24/2018  . HOH (hard of hearing)    RIGHT HEARING AID  . Hypertension   . Hyperthyroidism    . Presence of permanent cardiac pacemaker   . Sleep apnea    No C-PAP/ NO LONGER WEARS OXYGEN    Assessment/Plan:   3 Days Post-Op Procedure(s) (LRB): TOTAL HIP REVISION (Left) Active Problems:   S/P hip hemiarthroplasty  Estimated body mass index is 20.73 kg/m as calculated from the following:   Height as of this encounter: 4\' 10"  (1.473 m).   Weight as of this encounter: 45 kg. Advance diet Up with therapy  Needs  BM, order for FLEET enema placed Vital signs and labs stable Discharge home with home health PT today pending bowel movement   DVT Prophylaxis - TED hose and SCDs, Eliquis Weight-Bearing as tolerated to left leg   T. Rachelle Hora, PA-C [place] 03/13/2018, 8:48 AM

## 2018-03-13 NOTE — Progress Notes (Signed)
Physical Therapy Treatment Patient Details Name: Sarah Dougherty MRN: [id number] DOB: [birth year] Today's Date: 03/13/2018    History of Present Illness admitted for acute hospitalization status post L total hip revision (03/10/18), WBAT, post THPs.    PT Comments    Pt in bed, ready for session.  Son [place] in for session.  To edge of bed with min assist and good effort.  She was able to stand at bedside for marching in place with min/mod a x 1.  During seated rest, BP 122/64 in sitting.  Pt voices being nervous to discharge home in the rain fearing she will get sick.  Education provided to son regarding transfers and he was able to assist pt  X 2 to recliner with min/mod a x 1.  Education for guarding and walker position given.  She was able to ambulate to door and back with walker and min/mod a x 1 from son.  BP upon return to chair 93/79.  No reports of increased dizziness noted.  LE's elevated.  Son reports wanting to take his mother back home and reports being comfortable with her care.  She will require a wheelchair given her limited ambulation distances for mobility in the home and community.  She will also need a rolling walker as she only has a rollator (4 wheeled walker) at home.  Will update discharge recommendation to reflect above.    Follow Up Recommendations  Home health PT;Supervision for mobility/OOB;Supervision/Assistance - 24 hour     Equipment Recommendations  Rolling walker with 5" wheels;Wheelchair (measurements PT)    Recommendations for Other Services       Precautions / Restrictions Precautions Precautions: Fall;Posterior Hip Precaution Comments: contact iso Restrictions Weight Bearing Restrictions: Yes LLE Weight Bearing: Weight bearing as tolerated    Mobility  Bed Mobility Overal bed mobility: Needs Assistance Bed Mobility: Supine to Sit     Supine to sit: Min assist;Min guard        Transfers Overall transfer level: Needs assistance Equipment  used: Rolling walker (2 wheeled) Transfers: Sit to/from Stand Sit to Stand: Mod assist            Ambulation/Gait Ambulation/Gait assistance: Min assist;+2 physical assistance Gait Distance (Feet): 15 Feet Assistive device: Rolling walker (2 wheeled) Gait Pattern/deviations: Step-to pattern   Gait velocity interpretation: <1.31 ft/sec, indicative of household ambulator General Gait Details: unsteady step to gait with irregular step height and length.  Unsafe to walk without assist.   Stairs             Wheelchair Mobility    Modified Rankin (Stroke Patients Only)       Balance Overall balance assessment: Needs assistance Sitting-balance support: No upper extremity supported;Feet supported Sitting balance-Leahy Scale: Fair     Standing balance support: Bilateral upper extremity supported Standing balance-Leahy Scale: Poor                              Cognition Arousal/Alertness: Awake/alert Behavior During Therapy: WFL for tasks assessed/performed;Anxious Overall Cognitive Status: Within Functional Limits for tasks assessed                                        Exercises Other Exercises Other Exercises: transfers bed to chair x 2 with son for pt/family education     General Comments  Pertinent Vitals/Pain Pain Assessment: Faces Faces Pain Scale: Hurts a little bit Pain Location: L hip Pain Descriptors / Indicators: Operative site guarding Pain Intervention(s): Limited activity within patient's tolerance;Monitored during session    Home Living                      Prior Function            PT Goals (current goals can now be found in the care plan section) Progress towards PT goals: Progressing toward goals    Frequency    BID      PT Plan Current plan remains appropriate    Co-evaluation              AM-PAC PT "6 Clicks" Mobility   Outcome Measure  Help needed turning from your  back to your side while in a flat bed without using bedrails?: A Little Help needed moving from lying on your back to sitting on the side of a flat bed without using bedrails?: A Little Help needed moving to and from a bed to a chair (including a wheelchair)?: A Lot Help needed standing up from a chair using your arms (e.g., wheelchair or bedside chair)?: A Lot Help needed to walk in hospital room?: A Lot Help needed climbing 3-5 steps with a railing? : Total 6 Click Score: 13    End of Session Equipment Utilized During Treatment: Gait belt Activity Tolerance: Patient limited by lethargy;Patient limited by pain Patient left: in chair;with call bell/phone within reach;with chair alarm set;with family/visitor present   Pain - Right/Left: Left Pain - part of body: Hip     Time: 1155-2080 PT Time Calculation (min) (ACUTE ONLY): 18 min  Charges:  $Gait Training: 8-22 mins                     Chesley Noon, PTA 03/13/18, 10:45 AM

## 2018-03-13 NOTE — Care Management (Signed)
Patient suffers from left hip injury which impairs their ability to perform daily activities like toileting, feeding, dressing, grooming, and bathing in the home. A cane, walker, crutch will not resolve issue with performing activities of daily living. A wheelchair will allow patient to safely perform daily activities.  Patient can safely propel the wheelchair in the home or has a caregiver who can provide assistance. Wille Glaser PA updated of this request. Brad with [facility] also notified.

## 2018-03-14 ENCOUNTER — Encounter: Payer: Self-pay | Admitting: Emergency Medicine

## 2018-03-14 ENCOUNTER — Emergency Department: Payer: PPO

## 2018-03-14 ENCOUNTER — Other Ambulatory Visit: Payer: Self-pay

## 2018-03-14 ENCOUNTER — Observation Stay
Admission: EM | Admit: 2018-03-14 | Discharge: 2018-03-15 | Disposition: A | Discharge status: Home / Self Care | Payer: PPO | Attending: Specialist | Admitting: Specialist

## 2018-03-14 DIAGNOSIS — Z7901 Long term (current) use of anticoagulants: Secondary | ICD-10-CM | POA: Insufficient documentation

## 2018-03-14 DIAGNOSIS — J441 Chronic obstructive pulmonary disease with (acute) exacerbation: Secondary | ICD-10-CM | POA: Insufficient documentation

## 2018-03-14 DIAGNOSIS — Z66 Do not resuscitate: Secondary | ICD-10-CM | POA: Diagnosis not present

## 2018-03-14 DIAGNOSIS — K219 Gastro-esophageal reflux disease without esophagitis: Secondary | ICD-10-CM | POA: Diagnosis not present

## 2018-03-14 DIAGNOSIS — Z96642 Presence of left artificial hip joint: Secondary | ICD-10-CM | POA: Diagnosis not present

## 2018-03-14 DIAGNOSIS — D649 Anemia, unspecified: Secondary | ICD-10-CM | POA: Diagnosis not present

## 2018-03-14 DIAGNOSIS — E1122 Type 2 diabetes mellitus with diabetic chronic kidney disease: Secondary | ICD-10-CM | POA: Diagnosis not present

## 2018-03-14 DIAGNOSIS — I130 Hypertensive heart and chronic kidney disease with heart failure and stage 1 through stage 4 chronic kidney disease, or unspecified chronic kidney disease: Secondary | ICD-10-CM | POA: Insufficient documentation

## 2018-03-14 DIAGNOSIS — K922 Gastrointestinal hemorrhage, unspecified: Secondary | ICD-10-CM

## 2018-03-14 DIAGNOSIS — Z79899 Other long term (current) drug therapy: Secondary | ICD-10-CM | POA: Diagnosis not present

## 2018-03-14 DIAGNOSIS — I509 Heart failure, unspecified: Secondary | ICD-10-CM | POA: Diagnosis not present

## 2018-03-14 DIAGNOSIS — K449 Diaphragmatic hernia without obstruction or gangrene: Secondary | ICD-10-CM | POA: Insufficient documentation

## 2018-03-14 DIAGNOSIS — J9601 Acute respiratory failure with hypoxia: Secondary | ICD-10-CM | POA: Diagnosis not present

## 2018-03-14 DIAGNOSIS — H409 Unspecified glaucoma: Secondary | ICD-10-CM | POA: Diagnosis not present

## 2018-03-14 DIAGNOSIS — Z7984 Long term (current) use of oral hypoglycemic drugs: Secondary | ICD-10-CM | POA: Insufficient documentation

## 2018-03-14 DIAGNOSIS — J9600 Acute respiratory failure, unspecified whether with hypoxia or hypercapnia: Secondary | ICD-10-CM | POA: Diagnosis present

## 2018-03-14 DIAGNOSIS — R0902 Hypoxemia: Secondary | ICD-10-CM

## 2018-03-14 DIAGNOSIS — E0590 Thyrotoxicosis, unspecified without thyrotoxic crisis or storm: Secondary | ICD-10-CM | POA: Insufficient documentation

## 2018-03-14 DIAGNOSIS — I4891 Unspecified atrial fibrillation: Secondary | ICD-10-CM | POA: Diagnosis not present

## 2018-03-14 DIAGNOSIS — I2510 Atherosclerotic heart disease of native coronary artery without angina pectoris: Secondary | ICD-10-CM | POA: Diagnosis not present

## 2018-03-14 DIAGNOSIS — I482 Chronic atrial fibrillation, unspecified: Secondary | ICD-10-CM | POA: Insufficient documentation

## 2018-03-14 DIAGNOSIS — R0602 Shortness of breath: Secondary | ICD-10-CM | POA: Diagnosis not present

## 2018-03-14 DIAGNOSIS — N183 Chronic kidney disease, stage 3 (moderate): Secondary | ICD-10-CM | POA: Diagnosis not present

## 2018-03-14 DIAGNOSIS — I10 Essential (primary) hypertension: Secondary | ICD-10-CM | POA: Diagnosis not present

## 2018-03-14 DIAGNOSIS — Z950 Presence of cardiac pacemaker: Secondary | ICD-10-CM | POA: Diagnosis not present

## 2018-03-14 DIAGNOSIS — R069 Unspecified abnormalities of breathing: Secondary | ICD-10-CM | POA: Diagnosis not present

## 2018-03-14 LAB — COMPREHENSIVE METABOLIC PANEL
ALT: 13 U/L (ref 0–44)
AST: 14 U/L — ABNORMAL LOW (ref 15–41)
Albumin: 2.7 g/dL — ABNORMAL LOW (ref 3.5–5.0)
Alkaline Phosphatase: 85 U/L (ref 38–126)
Anion gap: 10 (ref 5–15)
BILIRUBIN TOTAL: 1 mg/dL (ref 0.3–1.2)
BUN: 11 mg/dL (ref 8–23)
CO2: 29 mmol/L (ref 22–32)
CREATININE: 0.57 mg/dL (ref 0.44–1.00)
Calcium: 8.3 mg/dL — ABNORMAL LOW (ref 8.9–10.3)
Chloride: 99 mmol/L (ref 98–111)
GFR calc Af Amer: >60 mL/min (ref >60)
GFR calc non Af Amer: >60 mL/min (ref >60)
Glucose, Bld: 243 mg/dL — ABNORMAL HIGH (ref 70–99)
Potassium: 3.2 mmol/L — ABNORMAL LOW (ref 3.5–5.1)
Sodium: 138 mmol/L (ref 135–145)
TOTAL PROTEIN: 5.9 g/dL — AB (ref 6.5–8.1)

## 2018-03-14 LAB — TROPONIN I: Troponin I: <0.03 ng/mL (ref <0.03)

## 2018-03-14 LAB — CBC WITH DIFFERENTIAL/PLATELET
ABS IMMATURE GRANULOCYTES: 0.13 10*3/uL — AB (ref 0.00–0.07)
Basophils Absolute: 0 10*3/uL (ref 0.0–0.1)
Basophils Relative: 0 %
Eosinophils Absolute: 0 10*3/uL (ref 0.0–0.5)
Eosinophils Relative: 0 %
HCT: 28.5 % — ABNORMAL LOW (ref 36.0–46.0)
HEMOGLOBIN: 8.7 g/dL — AB (ref 12.0–15.0)
IMMATURE GRANULOCYTES: 1 %
LYMPHS PCT: 6 %
Lymphs Abs: 0.9 10*3/uL (ref 0.7–4.0)
MCH: 27.6 pg (ref 26.0–34.0)
MCHC: 30.5 g/dL (ref 30.0–36.0)
MCV: 90.5 fL (ref 80.0–100.0)
Monocytes Absolute: 0.9 10*3/uL (ref 0.1–1.0)
Monocytes Relative: 6 %
NEUTROS PCT: 87 %
Neutro Abs: 13.5 10*3/uL — ABNORMAL HIGH (ref 1.7–7.7)
Platelets: 472 10*3/uL — ABNORMAL HIGH (ref 150–400)
RBC: 3.15 MIL/uL — ABNORMAL LOW (ref 3.87–5.11)
RDW: 15.8 % — ABNORMAL HIGH (ref 11.5–15.5)
WBC: 15.4 10*3/uL — ABNORMAL HIGH (ref 4.0–10.5)
nRBC: 0 % (ref 0.0–0.2)

## 2018-03-14 LAB — URINALYSIS, COMPLETE (UACMP) WITH MICROSCOPIC
Bilirubin Urine: NEGATIVE
Glucose, UA: >=500 mg/dL — AB
Hgb urine dipstick: NEGATIVE
Ketones, ur: 5 mg/dL — AB
Leukocytes, UA: NEGATIVE
Nitrite: NEGATIVE
Protein, ur: NEGATIVE mg/dL
SQUAMOUS EPITHELIAL / LPF: NONE SEEN (ref 0–5)
Specific Gravity, Urine: 1.01 (ref 1.005–1.030)
pH: 5 (ref 5.0–8.0)

## 2018-03-14 LAB — LACTIC ACID, PLASMA: Lactic Acid, Venous: 1.3 mmol/L (ref 0.5–1.9)

## 2018-03-14 LAB — TYPE AND SCREEN
ABO/RH(D): O POS
Antibody Screen: NEGATIVE

## 2018-03-14 LAB — MAGNESIUM: Magnesium: 1.7 mg/dL (ref 1.7–2.4)

## 2018-03-14 LAB — BRAIN NATRIURETIC PEPTIDE: B Natriuretic Peptide: 644 pg/mL — ABNORMAL HIGH (ref 0.0–100.0)

## 2018-03-14 MED ORDER — SODIUM CHLORIDE 0.9 % IV SOLN
INTRAVENOUS | Status: DC
  Administered 2018-03-14 – 2018-03-15 (×2): via INTRAVENOUS

## 2018-03-14 MED ORDER — ACETAMINOPHEN 650 MG RE SUPP: 650.0000 mg | Freq: Four times a day (QID) | RECTAL | Status: DC | PRN

## 2018-03-14 MED ORDER — ACETAMINOPHEN 325 MG PO TABS
650.0000 mg | ORAL_TABLET | Freq: Four times a day (QID) | ORAL | Status: DC | PRN
  Administered 2018-03-15: 650 mg via ORAL
  Filled 2018-03-14: qty 2

## 2018-03-14 MED ORDER — VITAMIN D 25 MCG (1000 UNIT) PO TABS
2000.0000 [IU] | ORAL_TABLET | Freq: Every day | ORAL | Status: DC
  Administered 2018-03-14 – 2018-03-15 (×2): 2000 [IU] via ORAL
  Filled 2018-03-14 (×2): qty 2

## 2018-03-14 MED ORDER — ALBUTEROL SULFATE HFA 108 (90 BASE) MCG/ACT IN AERS: 1.0000 | INHALATION_SPRAY | Freq: Four times a day (QID) | RESPIRATORY_TRACT | Status: DC | PRN

## 2018-03-14 MED ORDER — GLIMEPIRIDE 2 MG PO TABS
2.0000 mg | ORAL_TABLET | Freq: Every day | ORAL | Status: DC
  Administered 2018-03-14 – 2018-03-15 (×2): 2 mg via ORAL
  Filled 2018-03-14 (×2): qty 1

## 2018-03-14 MED ORDER — SODIUM CHLORIDE 0.9 % IV BOLUS
500.0000 mL | Freq: Once | INTRAVENOUS | Status: AC
  Administered 2018-03-14: 500 mL via INTRAVENOUS

## 2018-03-14 MED ORDER — TRAVOPROST (BAK FREE) 0.004 % OP SOLN
1.0000 [drp] | Freq: Every day | OPHTHALMIC | Status: DC
  Administered 2018-03-14: 1 [drp] via OPHTHALMIC
  Filled 2018-03-14: qty 2.5

## 2018-03-14 MED ORDER — IOHEXOL 350 MG/ML SOLN
75.0000 mL | Freq: Once | INTRAVENOUS | Status: AC | PRN
  Administered 2018-03-14: 75 mL via INTRAVENOUS

## 2018-03-14 MED ORDER — BRIMONIDINE TARTRATE 0.2 % OP SOLN
1.0000 [drp] | Freq: Two times a day (BID) | OPHTHALMIC | Status: DC
  Administered 2018-03-14 – 2018-03-15 (×3): 1 [drp] via OPHTHALMIC
  Filled 2018-03-14: qty 5

## 2018-03-14 MED ORDER — APIXABAN 2.5 MG PO TABS
2.5000 mg | ORAL_TABLET | Freq: Two times a day (BID) | ORAL | Status: DC
  Administered 2018-03-14 – 2018-03-15 (×3): 2.5 mg via ORAL
  Filled 2018-03-14 (×3): qty 1

## 2018-03-14 MED ORDER — ONDANSETRON HCL 4 MG/2ML IJ SOLN: 4.0000 mg | Freq: Four times a day (QID) | INTRAMUSCULAR | Status: DC | PRN

## 2018-03-14 MED ORDER — POTASSIUM CHLORIDE CRYS ER 20 MEQ PO TBCR
40.0000 meq | EXTENDED_RELEASE_TABLET | Freq: Two times a day (BID) | ORAL | Status: AC
  Administered 2018-03-14 (×2): 40 meq via ORAL
  Filled 2018-03-14 (×3): qty 2

## 2018-03-14 MED ORDER — METRONIDAZOLE 0.75 % VA GEL
1.0000 | Freq: Every day | VAGINAL | Status: DC
  Filled 2018-03-14: qty 70

## 2018-03-14 MED ORDER — TRAMADOL HCL 50 MG PO TABS: 50.0000 mg | ORAL_TABLET | ORAL | Status: DC | PRN

## 2018-03-14 MED ORDER — IPRATROPIUM-ALBUTEROL 0.5-2.5 (3) MG/3ML IN SOLN: 3.0000 mL | Freq: Four times a day (QID) | RESPIRATORY_TRACT | Status: DC | PRN

## 2018-03-14 MED ORDER — AMLODIPINE BESYLATE 5 MG PO TABS
5.0000 mg | ORAL_TABLET | Freq: Every day | ORAL | Status: DC
  Administered 2018-03-14: 5 mg via ORAL
  Filled 2018-03-14 (×2): qty 1

## 2018-03-14 MED ORDER — AMIODARONE HCL 200 MG PO TABS
100.0000 mg | ORAL_TABLET | Freq: Every day | ORAL | Status: DC
  Administered 2018-03-14 – 2018-03-15 (×2): 100 mg via ORAL
  Filled 2018-03-14 (×2): qty 1

## 2018-03-14 MED ORDER — ONDANSETRON HCL 4 MG PO TABS: 4.0000 mg | ORAL_TABLET | Freq: Four times a day (QID) | ORAL | Status: DC | PRN

## 2018-03-14 NOTE — ED Triage Notes (Signed)
Patient presents to Emergency Department via EMS with complaints of shortness of breath.  Pt from home, and was DC'd yesterday status post hip surgery.  EMS reports pt took oxycodone at 2130.

## 2018-03-14 NOTE — ED Triage Notes (Signed)
Pt at 87% on room air

## 2018-03-14 NOTE — ED Notes (Addendum)
ED Provider at bedside.  Fecal blood occult positive  Family denies hx of blood transfusion, verbal consent to transfusion if necessary  Family at bedside procedure explained  Son who takes care of pt reports that NO PAIN MEDS given since pt was discharged from hospital, pt was sleepy most of the day

## 2018-03-14 NOTE — Consult Note (Signed)
ORTHOPAEDIC CONSULTATION  REQUESTING PHYSICIAN: Henreitta Leber, MD  Chief Complaint:   Status post left hip hemiarthroplasty.  History of Present Illness: Sarah Dougherty is a 84 y.o. female with multiple medical problems including coronary artery disease, congestive heart failure, hypertension, diabetes, gastroesophageal reflux disease, chronic atrial fibrillation who is now 4 days status post a left hip hemiarthroplasty following a failed internal fixation of an intertrochanteric left hip fracture performed about 6 to 7 weeks ago.  The patient was doing well following her procedure and was discharged home in the care of her son yesterday.  Apparently, earlier this morning, the patient began to complain of increased feeling of malaise, as well as experiencing some nausea and shortness of breath.  The EMS squad was called and she was brought back to the emergency room evaluation in the emergency room demonstrated that she was somewhat hypoxic and anemic.  Subsequently, the patient has been admitted for further optimization of her medical condition.  Past Medical History:  Diagnosis Date  . A-fib ([place])   . Arthritis    Osteoarthritis  . Asthma   . Cancer (HCC)    PAROTID GLAND  . CHF (congestive heart failure) ([place])   . Chronic kidney disease     / CKD STAGE 3  . COPD (chronic obstructive pulmonary disease) ([place])   . Coronary artery disease   . Diabetes mellitus without complication ([place])   . Dysrhythmia    CHRONIC AFIB  . GERD (gastroesophageal reflux disease)   . Glaucoma (increased eye pressure)   . Hip fracture ([place]) 01/24/2018  . HOH (hard of hearing)    RIGHT HEARING AID  . Hypertension   . Hyperthyroidism   . Presence of permanent cardiac pacemaker   . Sleep apnea    No C-PAP/ NO LONGER WEARS OXYGEN   Past Surgical History:  Procedure Laterality Date  . ABDOMINAL HYSTERECTOMY    . BRAIN SURGERY    .  CHOLECYSTECTOMY    . ENUCLEATION Right    EYE  . ESOPHAGOGASTRODUODENOSCOPY (EGD) WITH PROPOFOL N/A 01/08/2018   Procedure: ESOPHAGOGASTRODUODENOSCOPY (EGD) WITH PROPOFOL;  Surgeon: Jonathon Bellows, MD;  Location[facility] ENDOSCOPY;  Service: Gastroenterology;  Laterality: N/A;  . EYE SURGERY Left    Cataract Extraction with IOL  . EYE SURGERY Right    Artificial Eye  . FRACTURE SURGERY Left 01/27/2018   LEFT FEMUR  . HIP ARTHROPLASTY Right 04/07/2017   Procedure: ARTHROPLASTY BIPOLAR HIP (HEMIARTHROPLASTY);  Surgeon: Dereck Leep, MD;  Location: [facility] ORS;  Service: Orthopedics;  Laterality: Right;  . INSERT / REPLACE / REMOVE PACEMAKER    . INTRAMEDULLARY (IM) NAIL INTERTROCHANTERIC Left 01/27/2018   Procedure: INTRAMEDULLARY (IM) NAIL INTERTROCHANTRIC ( TFNA );  Surgeon: Dereck Leep, MD;  Location: [facility] ORS;  Service: Orthopedics;  Laterality: Left;  . JOINT REPLACEMENT Right 04/07/2017   HIP/HEMI ARTHROPLASTY  . PACEMAKER INSERTION Left 12/12/2014   Procedure: INSERTION PACEMAKER;  Surgeon: Isaias Cowman, MD;  Location: [facility] ORS;  Service: Cardiovascular;  Laterality: Left;  . PAROTIDECTOMY Left 12/10/2016   Procedure: PAROTIDECTOMY;  Surgeon: Clyde Canterbury, MD;  Location: [facility] ORS;  Service: ENT;  Laterality: Left;  . RADICAL NECK DISSECTION Left 12/10/2016   Procedure: RADICAL NECK DISSECTION;  Surgeon: Clyde Canterbury, MD;  Location: [facility] ORS;  Service: ENT;  Laterality: Left;  . TOTAL HIP REVISION Left 03/10/2018   Procedure: TOTAL HIP REVISION;  Surgeon: Dereck Leep, MD;  Location: [facility] ORS;  Service: Orthopedics;  Laterality: Left;  Social History   Socioeconomic History  . Marital status: Widowed    Spouse name: Not on file  . Number of children: Not on file  . Years of education: Not on file  . Highest education level: Not on file  Occupational History  . Occupation: retired  Scientific laboratory technician  . Financial resource strain: Not on file  . Food insecurity:    Worry:  Not on file    Inability: Not on file  . Transportation needs:    Medical: Not on file    Non-medical: Not on file  Tobacco Use  . Smoking status: Never Smoker  . Smokeless tobacco: Never Used  Substance and Sexual Activity  . Alcohol use: No  . Drug use: No  . Sexual activity: Never  Lifestyle  . Physical activity:    Days per week: Not on file    Minutes per session: Not on file  . Stress: Not on file  Relationships  . Social connections:    Talks on phone: Not on file    Gets together: Not on file    Attends religious service: Not on file    Active member of club or organization: Not on file    Attends meetings of clubs or organizations: Not on file    Relationship status: Not on file  Other Topics Concern  . Not on file  Social History Narrative  . Not on file   Family History  Problem Relation Age of Onset  . Diabetes Brother   . Diabetes Son   . Stroke Mother   . Cancer Father   . Stroke Sister   . Stroke Sister   . COPD Brother   . Diabetes Son    Allergies  Allergen Reactions  . Amoxicillin Other (See Comments)    unknown  . Cephalexin Other (See Comments)    unknown  . Nitrofurantoin Other (See Comments)     Unknown  . Penicillins Other (See Comments)    Other reaction(s): Unknown  . Sulfa Antibiotics Hives and Itching  . Atorvastatin     Other reaction(s): Muscle Pain  . Azithromycin Rash   Prior to Admission medications   Medication Sig Start Date End Date Taking? Authorizing Provider  acetaminophen (TYLENOL 8 HOUR) 650 MG CR tablet Take 650 mg by mouth every 8 (eight) hours as needed for pain.   Yes [provider]  albuterol (PROAIR HFA) 108 (90 Base) MCG/ACT inhaler Inhale 1 puff into the lungs every 6 (six) hours as needed for wheezing or shortness of breath. 02/09/18  Yes Gerlene Fee, NP  amiodarone (PACERONE) 100 MG tablet Take 1 tablet (100 mg total) by mouth daily. 02/09/18  Yes Gerlene Fee, NP  amLODipine (NORVASC) 5  MG tablet Take 1 tablet by mouth daily. 02/20/18  Yes [provider]  apixaban (ELIQUIS) 2.5 MG TABS tablet Take 1 tablet (2.5 mg total) by mouth 2 (two) times daily. 02/09/18  Yes Gerlene Fee, NP  brimonidine (ALPHAGAN) 0.2 % ophthalmic solution Place 1 drop into the left eye 2 (two) times daily. 02/09/18  Yes Gerlene Fee, NP  Cholecalciferol (VITAMIN D) 2000 units tablet Take 2,000 Units by mouth daily.   Yes [provider]  CRANBERRY PO Take 1 capsule by mouth daily.   Yes [provider]  glimepiride (AMARYL) 2 MG tablet Take 1 tablet (2 mg total) by mouth daily. 02/09/18  Yes Gerlene Fee, NP  [place] CREA Apply  liberal amount to area of skin irritation every shift.  Okay to leave at bedside 01/30/18  Yes [provider]  metroNIDAZOLE (METROGEL) 0.75 % vaginal gel Place 1 Applicatorful vaginally at bedtime.   Yes [provider]  sodium chloride (OCEAN) 0.65 % nasal spray Place 2 sprays into the nose daily as needed. 09/14/14  Yes [provider]  traMADol (ULTRAM) 50 MG tablet Take 1-2 tablets (50-100 mg total) by mouth every 4 (four) hours as needed for moderate pain. 03/11/18  Yes Reche Dixon, PA-C  travoprost, benzalkonium, (TRAVATAN) 0.004 % ophthalmic solution Place 1 drop into the left eye at bedtime. 02/09/18  Yes Gerlene Fee, NP  oxyCODONE (OXY IR/ROXICODONE) 5 MG immediate release tablet Take 1 tablet (5 mg total) by mouth every 4 (four) hours as needed for moderate pain (pain score 4-6). Patient not taking: Reported on 03/14/2018 03/11/18   Reche Dixon, PA-C   Ct Angio Chest Pe W/cm &/or Wo Cm  Result Date: 03/14/2018 CLINICAL DATA:  Shortness of breath.  Hip surgery 4 days ago. EXAM: CT ANGIOGRAPHY CHEST WITH CONTRAST TECHNIQUE: Multidetector CT imaging of the chest was performed using the standard protocol during bolus administration of intravenous contrast. Multiplanar CT image  reconstructions and MIPs were obtained to evaluate the vascular anatomy. CONTRAST:  62mL OMNIPAQUE IOHEXOL 350 MG/ML SOLN COMPARISON:  CT chest 02/17/2014 FINDINGS: Cardiovascular: There is moderate multi chamber cardiac enlargement. Aortic atherosclerosis. Left chest wall pacer device is noted with leads in the right atrial appendage and right ventricle. The main pulmonary artery is patent. No saddle embolus. No lobar or segmental pulmonary artery filling defects identified Mediastinum/Nodes: Normal appearance of the thyroid gland. The trachea appears patent and is midline. Small to moderate hiatal hernia noted. No axillary or supraclavicular adenopathy. No mediastinal or hilar adenopathy. Lungs/Pleura: No pleural effusions. Bilateral lower lobe subsegmental atelectasis identified. Diffuse bronchial wall thickening. No airspace consolidation. Nodule in the left lower lobe appears nonspecific measuring 1.2 cm, image 64/6. New from 02/17/2014. Upper Abdomen: No acute abnormalities.  Right lobe of liver cyst. Musculoskeletal: No chest wall abnormality. No acute or significant osseous findings. Old healed left rib deformities. Review of the MIP images confirms the above findings. IMPRESSION: 1. No evidence for acute pulmonary embolus. 2. Bibasilar subsegmental atelectasis. 3. Left lower lobe nodule measures 1.2 cm. Nonspecific. This may be related to the lower lobe atelectasis. Recommend repeat chest CT in 3 months to ensure resolution. If this does not resolve then consider further evaluation with PET-CT versus multi disciplinary thoracic consultation. 4.  Aortic Atherosclerosis (ICD10-I70.0). Electronically Signed   By: Kerby Moors M.D.   On: 03/14/2018 08:18   Dg Chest Port 1 View  Result Date: 03/14/2018 CLINICAL DATA:  Shortness of breath. Recent left hip replacement. EXAM: PORTABLE CHEST 1 VIEW COMPARISON:  01/24/2018 FINDINGS: Lower lung volumes from prior exam. Left-sided pacemaker in place. The heart is  enlarged. Unchanged mediastinal contours. Streaky retrocardiac atelectasis or scarring. No confluent airspace disease. No pulmonary edema, large pleural effusion or pneumothorax. Bones are diffusely under mineralized. IMPRESSION: Low lung volumes.  Streaky retrocardiac atelectasis or scarring. Cardiomegaly. Electronically Signed   By: Keith Rake M.D.   On: 03/14/2018 06:30    Positive ROS: All other systems have been reviewed and were otherwise negative with the exception of those mentioned in the HPI and as above.  Physical Exam: General:  Alert, no acute distress Psychiatric:  Patient is competent for consent with normal mood and affect  Cardiovascular:  No pedal edema Respiratory:  No wheezing, non-labored breathing GI:  Abdomen is soft and non-tender Skin:  No lesions in the area of chief complaint Neurologic:  Sensation intact distally Lymphatic:  No axillary or cervical lymphadenopathy  Orthopedic Exam:  Orthopedic examination is limited to the left hip and lower extremity.  Skin inspection of the left hip is notable for some serosanguineous drainage on her dressing.  However, there is no erythema or ecchymosis around the incision site.  She does have moderate tenderness to palpation over the surgical site.  She is neurovascularly intact to the left lower extremity and foot.  X-rays:  No new x-rays have been obtained since her initial postoperative films.  Assessment: Status post left hip hemiarthroplasty.  Plan: The treatment options have been discussed with the patient and her son, who is at the bedside.  She may be mobilized with physical therapy as symptoms permit, weightbearing as tolerated on her left lower extremity, when she has been cleared medically to resume therapy.  Standard posterior hip precautions should be maintained.  Thank you for asked me to participate in the care of this most pleasant woman.  The [facility] orthopedic team will be happy to follow her with  you.   Pascal Lux, MD  Beeper #:  [phone]  03/14/2018 10:58 AM

## 2018-03-14 NOTE — Plan of Care (Signed)
  Problem: Health Behavior/Discharge Planning: Goal: Ability to manage health-related needs will improve Outcome: Progressing   Problem: Clinical Measurements: Goal: Ability to maintain clinical measurements within normal limits will improve Outcome: Progressing Goal: Will remain free from infection Outcome: Progressing Goal: Diagnostic test results will improve Outcome: Progressing   Problem: Activity: Goal: Risk for activity intolerance will decrease Outcome: Progressing   Problem: Nutrition: Goal: Adequate nutrition will be maintained Outcome: Progressing   Problem: Elimination: Goal: Will not experience complications related to bowel motility Outcome: Progressing   Problem: Safety: Goal: Ability to remain free from injury will improve Outcome: Progressing   Problem: Skin Integrity: Goal: Risk for impaired skin integrity will decrease Outcome: Progressing

## 2018-03-14 NOTE — Care Management (Signed)
Patient admitted to [facility] under observation status for acute respiratory failure. RNCM consulted on patient to provide MOON letter and complete assessment. Patient discharged yesterday. Discharged with home health and DME wheelchair.  '

## 2018-03-14 NOTE — ED Notes (Signed)
Assisted to bathroom

## 2018-03-14 NOTE — Care Management Obs Status (Signed)
[facility] NOTIFICATION   Patient Details  Name: Sarah Dougherty MRN: [id number] Date of Birth: 10-Sep-1921   Medicare Observation Status Notification Given:  Yes    Alyshia Kernan A Yolande Skoda, RN 03/14/2018, 1:40 PM

## 2018-03-14 NOTE — H&P (Signed)
Sarah Dougherty at [place] NAME: Sarah Dougherty    MR#:  [id number]  DATE OF BIRTH:  08-31-1921  DATE OF ADMISSION:  03/14/2018  PRIMARY CARE PHYSICIAN: Perrin Maltese, MD   REQUESTING/REFERRING PHYSICIAN: Dr. Lurline Hare  CHIEF COMPLAINT:   Chief Complaint  Patient presents with  . Shortness of Breath    HISTORY OF PRESENT ILLNESS:  Sarah Dougherty  is a 84 y.o. female with a known history of atrial fibrillation, chronic kidney disease stage III, COPD, diabetes, history of coronary artery disease, hypertension, obstructive sleep apnea, osteoarthritis who presents to the hospital due to shortness of breath.  Patient just left the hospital yesterday after having a total left hip replacement and was sent home with home health services.  As per the son who gives most of the history patient was complaining that she was not feeling well but could not elaborate on that.  She then complained of some mild shortness of breath and also felt sick in her stomach but had no vomiting.  The son was concerned and therefore brought her to the hospital for further evaluation.  In the emergency room patient was noted to be hypoxic on room air with O2 sats in the mid 80s.  Chest x-ray is negative for acute pneumonia, given her recent hip replacement she underwent CT of the chest which is also negative for pulmonary embolism.  She does have a history of COPD and remains hypoxic on room air and therefore hospitalist services were contacted for further treatment evaluation.  Patient denies any chest pains, palpitations, melena, hematochezia, dysuria hematuria or any other associated symptoms presently.  PAST MEDICAL HISTORY:   Past Medical History:  Diagnosis Date  . A-fib ([place])   . Arthritis    Osteoarthritis  . Asthma   . Cancer (HCC)    PAROTID GLAND  . CHF (congestive heart failure) ([place])   . Chronic kidney disease     / CKD STAGE 3  . COPD (chronic obstructive  pulmonary disease) ([place])   . Coronary artery disease   . Diabetes mellitus without complication ([place])   . Dysrhythmia    CHRONIC AFIB  . GERD (gastroesophageal reflux disease)   . Glaucoma (increased eye pressure)   . Hip fracture ([place]) 01/24/2018  . HOH (hard of hearing)    RIGHT HEARING AID  . Hypertension   . Hyperthyroidism   . Presence of permanent cardiac pacemaker   . Sleep apnea    No C-PAP/ NO LONGER WEARS OXYGEN    PAST SURGICAL HISTORY:   Past Surgical History:  Procedure Laterality Date  . ABDOMINAL HYSTERECTOMY    . BRAIN SURGERY    . CHOLECYSTECTOMY    . ENUCLEATION Right    EYE  . ESOPHAGOGASTRODUODENOSCOPY (EGD) WITH PROPOFOL N/A 01/08/2018   Procedure: ESOPHAGOGASTRODUODENOSCOPY (EGD) WITH PROPOFOL;  Surgeon: Jonathon Bellows, MD;  Location: [facility] ENDOSCOPY;  Service: Gastroenterology;  Laterality: N/A;  . EYE SURGERY Left    Cataract Extraction with IOL  . EYE SURGERY Right    Artificial Eye  . FRACTURE SURGERY Left 01/27/2018   LEFT FEMUR  . HIP ARTHROPLASTY Right 04/07/2017   Procedure: ARTHROPLASTY BIPOLAR HIP (HEMIARTHROPLASTY);  Surgeon: Dereck Leep, MD;  Location: [facility] ORS;  Service: Orthopedics;  Laterality: Right;  . INSERT / REPLACE / REMOVE PACEMAKER    . INTRAMEDULLARY (IM) NAIL INTERTROCHANTERIC Left 01/27/2018   Procedure: INTRAMEDULLARY (IM) NAIL INTERTROCHANTRIC ( TFNA );  Surgeon: Skip Estimable  P, MD;  Location: [facility] ORS;  Service: Orthopedics;  Laterality: Left;  . JOINT REPLACEMENT Right 04/07/2017   HIP/HEMI ARTHROPLASTY  . PACEMAKER INSERTION Left 12/12/2014   Procedure: INSERTION PACEMAKER;  Surgeon: Isaias Cowman, MD;  Location: [facility] ORS;  Service: Cardiovascular;  Laterality: Left;  . PAROTIDECTOMY Left 12/10/2016   Procedure: PAROTIDECTOMY;  Surgeon: Clyde Canterbury, MD;  Location: [facility] ORS;  Service: ENT;  Laterality: Left;  . RADICAL NECK DISSECTION Left 12/10/2016   Procedure: RADICAL NECK DISSECTION;  Surgeon: Clyde Canterbury,  MD;  Location: [facility] ORS;  Service: ENT;  Laterality: Left;  . TOTAL HIP REVISION Left 03/10/2018   Procedure: TOTAL HIP REVISION;  Surgeon: Dereck Leep, MD;  Location: [facility] ORS;  Service: Orthopedics;  Laterality: Left;    SOCIAL HISTORY:   Social History   Tobacco Use  . Smoking status: Never Smoker  . Smokeless tobacco: Never Used  Substance Use Topics  . Alcohol use: No    FAMILY HISTORY:   Family History  Problem Relation Age of Onset  . Diabetes Brother   . Diabetes Son   . Stroke Mother   . Cancer Father   . Stroke Sister   . Stroke Sister   . COPD Brother   . Diabetes Son     DRUG ALLERGIES:   Allergies  Allergen Reactions  . Amoxicillin Other (See Comments)    unknown  . Cephalexin Other (See Comments)    unknown  . Nitrofurantoin Other (See Comments)     Unknown  . Penicillins Other (See Comments)    Other reaction(s): Unknown  . Sulfa Antibiotics Hives and Itching  . Atorvastatin     Other reaction(s): Muscle Pain  . Azithromycin Rash    REVIEW OF SYSTEMS:   Review of Systems  Constitutional: Negative for fever and weight loss.  HENT: Negative for congestion, nosebleeds and tinnitus.   Eyes: Negative for blurred vision, double vision and redness.  Respiratory: Positive for shortness of breath. Negative for cough and hemoptysis.   Cardiovascular: Negative for chest pain, orthopnea, leg swelling and PND.  Gastrointestinal: Negative for abdominal pain, diarrhea, melena, nausea and vomiting.  Genitourinary: Negative for dysuria, hematuria and urgency.  Musculoskeletal: Negative for falls and joint pain.  Neurological: Negative for dizziness, tingling, sensory change, focal weakness, seizures, weakness and headaches.  Endo/Heme/Allergies: Negative for polydipsia. Does not bruise/bleed easily.  Psychiatric/Behavioral: Negative for depression and memory loss. The patient is not nervous/anxious.     MEDICATIONS AT HOME:   Prior to Admission  medications   Medication Sig Start Date End Date Taking? Authorizing Provider  acetaminophen (TYLENOL 8 HOUR) 650 MG CR tablet Take 650 mg by mouth every 8 (eight) hours as needed for pain.   Yes [provider]  albuterol (PROAIR HFA) 108 (90 Base) MCG/ACT inhaler Inhale 1 puff into the lungs every 6 (six) hours as needed for wheezing or shortness of breath. 02/09/18  Yes Gerlene Fee, NP  amiodarone (PACERONE) 100 MG tablet Take 1 tablet (100 mg total) by mouth daily. 02/09/18  Yes Gerlene Fee, NP  amLODipine (NORVASC) 5 MG tablet Take 1 tablet by mouth daily. 02/20/18  Yes [provider]  apixaban (ELIQUIS) 2.5 MG TABS tablet Take 1 tablet (2.5 mg total) by mouth 2 (two) times daily. 02/09/18  Yes Gerlene Fee, NP  brimonidine (ALPHAGAN) 0.2 % ophthalmic solution Place 1 drop into the left eye 2 (two) times daily. 02/09/18  Yes Gerlene Fee, NP  Cholecalciferol (  VITAMIN D) 2000 units tablet Take 2,000 Units by mouth daily.   Yes [provider]  CRANBERRY PO Take 1 capsule by mouth daily.   Yes [provider]  glimepiride (AMARYL) 2 MG tablet Take 1 tablet (2 mg total) by mouth daily. 02/09/18  Yes Gerlene Fee, NP  [facility]) CREA Apply liberal amount to area of skin irritation every shift.  Okay to leave at bedside 01/30/18  Yes [provider]  metroNIDAZOLE (METROGEL) 0.75 % vaginal gel Place 1 Applicatorful vaginally at bedtime.   Yes [provider]  sodium chloride (OCEAN) 0.65 % nasal spray Place 2 sprays into the nose daily as needed. 09/14/14  Yes [provider]  traMADol (ULTRAM) 50 MG tablet Take 1-2 tablets (50-100 mg total) by mouth every 4 (four) hours as needed for moderate pain. 03/11/18  Yes Reche Dixon, PA-C  travoprost, benzalkonium, (TRAVATAN) 0.004 % ophthalmic solution Place 1 drop into the left eye at bedtime. 02/09/18  Yes Gerlene Fee, NP  oxyCODONE (OXY  IR/ROXICODONE) 5 MG immediate release tablet Take 1 tablet (5 mg total) by mouth every 4 (four) hours as needed for moderate pain (pain score 4-6). Patient not taking: Reported on 03/14/2018 03/11/18   Reche Dixon, PA-C      VITAL SIGNS:  Blood pressure (!) 114/57, pulse 62, temperature 97.9 F (36.6 C), temperature source Rectal, resp. rate 20, height 5\' 1"  (1.549 m), weight 45.8 kg, SpO2 96 %.  PHYSICAL EXAMINATION:  Physical Exam  GENERAL:  84 y.o.-year-old thin patient lying in the bed in no acute distress.  EYES: Pupils equal, round, reactive to light and accommodation. No scleral icterus. Extraocular muscles intact. Blind in left eye.  HEENT: Head atraumatic, normocephalic. Oropharynx and nasopharynx clear. No oropharyngeal erythema, moist oral mucosa  NECK:  Supple, no jugular venous distention. No thyroid enlargement, no tenderness.  LUNGS: Normal breath sounds bilaterally, no wheezing, rales, rhonchi. No use of accessory muscles of respiration.  CARDIOVASCULAR: S1, S2 RRR. No murmurs, rubs, gallops, clicks.  ABDOMEN: Soft, nontender, nondistended. Bowel sounds present. No organomegaly or mass.  EXTREMITIES: No pedal edema, cyanosis, or clubbing. + 2 pedal & radial pulses b/l.  Left hip dressing in place from recent left hip replacement.  Slightly blood tinged.  NEUROLOGIC: Cranial nerves II through XII are intact. No focal Motor or sensory deficits appreciated b/l PSYCHIATRIC: The patient is alert and oriented x 3. SKIN: No obvious rash, lesion, or ulcer.   LABORATORY PANEL:   CBC Recent Labs  Lab 03/14/18 0608  WBC 15.4*  HGB 8.7*  HCT 28.5*  PLT 472*   ------------------------------------------------------------------------------------------------------------------  Chemistries  Recent Labs  Lab 03/14/18 0608  NA 138  K 3.2*  CL 99  CO2 29  GLUCOSE 243*  BUN 11  CREATININE 0.57  CALCIUM 8.3*  AST 14*  ALT 13  ALKPHOS 85  BILITOT 1.0    ------------------------------------------------------------------------------------------------------------------  Cardiac Enzymes Recent Labs  Lab 03/14/18 0608  TROPONINI <0.03   ------------------------------------------------------------------------------------------------------------------  RADIOLOGY:  Ct Angio Chest Pe W/cm &/or Wo Cm  Result Date: 03/14/2018 CLINICAL DATA:  Shortness of breath.  Hip surgery 4 days ago. EXAM: CT ANGIOGRAPHY CHEST WITH CONTRAST TECHNIQUE: Multidetector CT imaging of the chest was performed using the standard protocol during bolus administration of intravenous contrast. Multiplanar CT image reconstructions and MIPs were obtained to evaluate the vascular anatomy. CONTRAST:  52mL OMNIPAQUE IOHEXOL 350 MG/ML SOLN COMPARISON:  CT chest 02/17/2014 FINDINGS: Cardiovascular: There is  moderate multi chamber cardiac enlargement. Aortic atherosclerosis. Left chest wall pacer device is noted with leads in the right atrial appendage and right ventricle. The main pulmonary artery is patent. No saddle embolus. No lobar or segmental pulmonary artery filling defects identified Mediastinum/Nodes: Normal appearance of the thyroid gland. The trachea appears patent and is midline. Small to moderate hiatal hernia noted. No axillary or supraclavicular adenopathy. No mediastinal or hilar adenopathy. Lungs/Pleura: No pleural effusions. Bilateral lower lobe subsegmental atelectasis identified. Diffuse bronchial wall thickening. No airspace consolidation. Nodule in the left lower lobe appears nonspecific measuring 1.2 cm, image 64/6. New from 02/17/2014. Upper Abdomen: No acute abnormalities.  Right lobe of liver cyst. Musculoskeletal: No chest wall abnormality. No acute or significant osseous findings. Old healed left rib deformities. Review of the MIP images confirms the above findings. IMPRESSION: 1. No evidence for acute pulmonary embolus. 2. Bibasilar subsegmental atelectasis. 3.  Left lower lobe nodule measures 1.2 cm. Nonspecific. This may be related to the lower lobe atelectasis. Recommend repeat chest CT in 3 months to ensure resolution. If this does not resolve then consider further evaluation with PET-CT versus multi disciplinary thoracic consultation. 4.  Aortic Atherosclerosis (ICD10-I70.0). Electronically Signed   By: Kerby Moors M.D.   On: 03/14/2018 08:18   Dg Chest Port 1 View  Result Date: 03/14/2018 CLINICAL DATA:  Shortness of breath. Recent left hip replacement. EXAM: PORTABLE CHEST 1 VIEW COMPARISON:  01/24/2018 FINDINGS: Lower lung volumes from prior exam. Left-sided pacemaker in place. The heart is enlarged. Unchanged mediastinal contours. Streaky retrocardiac atelectasis or scarring. No confluent airspace disease. No pulmonary edema, large pleural effusion or pneumothorax. Bones are diffusely under mineralized. IMPRESSION: Low lung volumes.  Streaky retrocardiac atelectasis or scarring. Cardiomegaly. Electronically Signed   By: Keith Rake M.D.   On: 03/14/2018 06:30     IMPRESSION AND PLAN:   84 year old female with past medical history of atrial fibrillation, COPD, hypertension, diabetes, osteoarthritis, history of CHF, status post recent left hip replacement who presents to the hospital secondary to shortness of breath.  1.  Acute respiratory failure with hypoxia- suspected to be secondary to mild atelectasis/likely underlying COPD but no acute exacerbation.  Given her recent hip surgery patient underwent CT of the chest which was negative for pulmonary embolism.   -Clinically patient has no evidence of congestive heart failure or pneumonia. - Continue O2 supplementation, will place on PRN duo nebs add incentive spirometry.  We will need to assess her for home oxygen prior to discharge.  2.  Status post recent left hip replacement- we will order physical therapy, patient was discharged yesterday with home health services. - Continue Eliquis for  DVT prophylaxis.  3.  Diabetes type 2 without complication-continue glimepiride.  4.  Essential hypertension-continue Norvasc.  5.  History of atrial fibrillation-rate controlled.  Continue amiodarone.  6.  Glaucoma-continue Alphagan eyedrops.   All the records are reviewed and case discussed with ED provider. Management plans discussed with the patient, family and they are in agreement.  CODE STATUS: DNR  TOTAL TIME TAKING CARE OF THIS PATIENT: 40 minutes.    Henreitta Leber M.D on 03/14/2018 at 8:36 AM  Between 7am to 6pm - Pager - [phone]  After 6pm go to [email or web address] - password [email or web address] [place] [facility]  Office  [phone]  CC: Primary care physician; Perrin Maltese, MD

## 2018-03-14 NOTE — ED Provider Notes (Signed)
[facility] Emergency Department Provider Note   ____________________________________________   First MD Initiated Contact with Patient 03/14/18 [phone]     (approximate)  I have reviewed the triage vital signs and the nursing notes.   HISTORY  Chief Complaint Shortness of Breath  Level V caveat: Limited by old age and hard of hearing  HPI Sarah Dougherty is a 84 y.o. female brought to the ED via EMS from nursing facility with a chief complaint of shortness of breath.  Patient has a history of COPD, on Eliquis for atrial fibrillation; recent left hip ORIF on 03/12/2018.  Presents with shortness of breath and chest tightness.  Endorses nonproductive cough.  Denies associated fever, chills, abdominal pain, nausea or vomiting.   Past Medical History:  Diagnosis Date  . A-fib ([place])   . Arthritis    Osteoarthritis  . Asthma   . Cancer (HCC)    PAROTID GLAND  . CHF (congestive heart failure) ([place])   . Chronic kidney disease     / CKD STAGE 3  . COPD (chronic obstructive pulmonary disease) ([place])   . Coronary artery disease   . Diabetes mellitus without complication ([place])   . Dysrhythmia    CHRONIC AFIB  . GERD (gastroesophageal reflux disease)   . Glaucoma (increased eye pressure)   . Hip fracture ([place]) 01/24/2018  . HOH (hard of hearing)    RIGHT HEARING AID  . Hypertension   . Hyperthyroidism   . Presence of permanent cardiac pacemaker   . Sleep apnea    No C-PAP/ NO LONGER WEARS OXYGEN    Patient Active Problem List   Diagnosis Date Noted  . Hip fracture ([place]) 01/24/2018  . Chronic kidney disease, stage 3 ([place]) 01/13/2018  . GERD (gastroesophageal reflux disease) 01/13/2018  . Hyperlipidemia 01/13/2018  . Hypertension 01/13/2018  . Osteoarthritis of knees, bilateral 01/13/2018  . 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 Duodenal mass   . Abdominal pain   . Pancreatitis 01/05/2018  . S/P hip hemiarthroplasty 08/16/2017  .  Age-related osteoporosis with current pathological fracture with routine healing 04/10/2017  . Chronic atrial fibrillation 04/10/2017  . Hip fx ([place]) 04/03/2017  . 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 COPD exacerbation ([place]) 04/07/2016  . Squamous cell carcinoma 03/19/2016  . Bradycardia 12/10/2014  . COPD (chronic obstructive pulmonary disease) ([place]) 12/09/2014  . Asthma 02/17/2014  . Congestive heart failure ([place]) 02/17/2014  . Chronic respiratory failure with hypoxia ([place]) 11/09/2013  . Benign essential hypertension 12/07/2006  . Type 2 diabetes mellitus ([place]) 12/07/2006    Past Surgical History:  Procedure Laterality Date  . ABDOMINAL HYSTERECTOMY    . BRAIN SURGERY    . CHOLECYSTECTOMY    . ENUCLEATION Right    EYE  . ESOPHAGOGASTRODUODENOSCOPY (EGD) WITH PROPOFOL N/A 01/08/2018   Procedure: ESOPHAGOGASTRODUODENOSCOPY (EGD) WITH PROPOFOL;  Surgeon: Jonathon Bellows, MD;  Location: [facility] ENDOSCOPY;  Service: Gastroenterology;  Laterality: N/A;  . EYE SURGERY Left    Cataract Extraction with IOL  . EYE SURGERY Right    Artificial Eye  . FRACTURE SURGERY Left 01/27/2018   LEFT FEMUR  . HIP ARTHROPLASTY Right 04/07/2017   Procedure: ARTHROPLASTY BIPOLAR HIP (HEMIARTHROPLASTY);  Surgeon: Dereck Leep, MD;  Location: [facility] ORS;  Service: Orthopedics;  Laterality: Right;  . INSERT / REPLACE / REMOVE PACEMAKER    . INTRAMEDULLARY (IM) NAIL INTERTROCHANTERIC Left 01/27/2018   Procedure: INTRAMEDULLARY (IM) NAIL INTERTROCHANTRIC ( TFNA );  Surgeon: Dereck Leep,  MD;  Location: [facility] ORS;  Service: Orthopedics;  Laterality: Left;  . JOINT REPLACEMENT Right 04/07/2017   HIP/HEMI ARTHROPLASTY  . PACEMAKER INSERTION Left 12/12/2014   Procedure: INSERTION PACEMAKER;  Surgeon: Isaias Cowman, MD;  Location: [facility] ORS;  Service: Cardiovascular;  Laterality: Left;  . PAROTIDECTOMY Left 12/10/2016   Procedure: PAROTIDECTOMY;  Surgeon: Clyde Canterbury, MD;  Location: [facility]  ORS;  Service: ENT;  Laterality: Left;  . RADICAL NECK DISSECTION Left 12/10/2016   Procedure: RADICAL NECK DISSECTION;  Surgeon: Clyde Canterbury, MD;  Location: [facility] ORS;  Service: ENT;  Laterality: Left;  . TOTAL HIP REVISION Left 03/10/2018   Procedure: TOTAL HIP REVISION;  Surgeon: Dereck Leep, MD;  Location: [facility] ORS;  Service: Orthopedics;  Laterality: Left;    Prior to Admission medications   Medication Sig Start Date End Date Taking? Authorizing Provider  acetaminophen (TYLENOL 8 HOUR) 650 MG CR tablet Take 650 mg by mouth every 8 (eight) hours as needed for pain.   Yes [provider]  albuterol (PROAIR HFA) 108 (90 Base) MCG/ACT inhaler Inhale 1 puff into the lungs every 6 (six) hours as needed for wheezing or shortness of breath. 02/09/18  Yes Gerlene Fee, NP  amiodarone (PACERONE) 100 MG tablet Take 1 tablet (100 mg total) by mouth daily. 02/09/18  Yes Gerlene Fee, NP  amLODipine (NORVASC) 5 MG tablet Take 1 tablet by mouth daily. 02/20/18  Yes [provider]  apixaban (ELIQUIS) 2.5 MG TABS tablet Take 1 tablet (2.5 mg total) by mouth 2 (two) times daily. 02/09/18  Yes Gerlene Fee, NP  brimonidine (ALPHAGAN) 0.2 % ophthalmic solution Place 1 drop into the left eye 2 (two) times daily. 02/09/18  Yes Gerlene Fee, NP  Cholecalciferol (VITAMIN D) 2000 units tablet Take 2,000 Units by mouth daily.   Yes [provider]  CRANBERRY PO Take 1 capsule by mouth daily.   Yes [provider]  glimepiride (AMARYL) 2 MG tablet Take 1 tablet (2 mg total) by mouth daily. 02/09/18  Yes Gerlene Fee, NP  [facility]) CREA Apply liberal amount to area of skin irritation every shift.  Okay to leave at bedside 01/30/18  Yes [provider]  metroNIDAZOLE (METROGEL) 0.75 % vaginal gel Place 1 Applicatorful vaginally at bedtime.   Yes [provider]  sodium chloride (OCEAN) 0.65 % nasal spray Place 2 sprays  into the nose daily as needed. 09/14/14  Yes [provider]  traMADol (ULTRAM) 50 MG tablet Take 1-2 tablets (50-100 mg total) by mouth every 4 (four) hours as needed for moderate pain. 03/11/18  Yes Reche Dixon, PA-C  travoprost, benzalkonium, (TRAVATAN) 0.004 % ophthalmic solution Place 1 drop into the left eye at bedtime. 02/09/18  Yes Gerlene Fee, NP  oxyCODONE (OXY IR/ROXICODONE) 5 MG immediate release tablet Take 1 tablet (5 mg total) by mouth every 4 (four) hours as needed for moderate pain (pain score 4-6). Patient not taking: Reported on 03/14/2018 03/11/18   Reche Dixon, PA-C    Allergies Amoxicillin; Cephalexin; Nitrofurantoin; Penicillins; Sulfa antibiotics; Atorvastatin; and Azithromycin  Family History  Problem Relation Age of Onset  . Diabetes Brother   . Diabetes Son   . Stroke Mother   . Cancer Father   . Stroke Sister   . Stroke Sister   . COPD Brother   . Diabetes Son     Social History Social History   Tobacco Use  . Smoking status: Never Smoker  .  Smokeless tobacco: Never Used  Substance Use Topics  . Alcohol use: No  . Drug use: No    Review of Systems  Constitutional: No fever/chills Eyes: No visual changes. ENT: No sore throat. Cardiovascular: Positive for chest pain. Respiratory: Positive for cough and shortness of breath. Gastrointestinal: No abdominal pain.  No nausea, no vomiting.  No diarrhea.  No constipation. Genitourinary: Negative for dysuria. Musculoskeletal: Negative for back pain. Skin: Negative for rash. Neurological: Negative for headaches, focal weakness or numbness.   ____________________________________________   PHYSICAL EXAM:  VITAL SIGNS: ED Triage Vitals  Enc Vitals Group     BP      Pulse      Resp      Temp      Temp src      SpO2      Weight      Height      Head Circumference      Peak Flow      Pain Score      Pain Loc      Pain Edu?      Excl. in [place]?     Constitutional: Alert and  oriented.  Frail appearing and in mild acute distress. Eyes: Conjunctivae are normal. PERRL. EOMI. Head: Atraumatic. Nose: No congestion/rhinnorhea. Mouth/Throat: Mucous membranes are moist.  Oropharynx non-erythematous. Neck: No stridor.   Cardiovascular: Normal rate, irregular rhythm. Grossly normal heart sounds.  Good peripheral circulation. Respiratory: Normal respiratory effort.  No retractions. Lungs diminished bibasilarly. Gastrointestinal: Soft and nontender. No distention. No abdominal bruits. No CVA tenderness. Musculoskeletal: Left OpSite clean/dry/intact.  No lower extremity tenderness nor edema.  No joint effusions. Neurologic:  Normal speech and language. No gross focal neurologic deficits are appreciated.  Skin:  Skin is warm, dry and intact. No rash noted. Psychiatric: Mood and affect are normal. Speech and behavior are normal.  ____________________________________________   LABS (all labs ordered are listed, but only abnormal results are displayed)  Labs Reviewed  CBC WITH DIFFERENTIAL/PLATELET - Abnormal; Notable for the following components:      Result Value   WBC 15.4 (*)    RBC 3.15 (*)    Hemoglobin 8.7 (*)    HCT 28.5 (*)    RDW 15.8 (*)    Platelets 472 (*)    Neutro Abs 13.5 (*)    Abs Immature Granulocytes 0.13 (*)    All other components within normal limits  COMPREHENSIVE METABOLIC PANEL - Abnormal; Notable for the following components:   Potassium 3.2 (*)    Glucose, Bld 243 (*)    Calcium 8.3 (*)    Total Protein 5.9 (*)    Albumin 2.7 (*)    AST 14 (*)    All other components within normal limits  BRAIN NATRIURETIC PEPTIDE - Abnormal; Notable for the following components:   B Natriuretic Peptide 644.0 (*)    All other components within normal limits  URINALYSIS, COMPLETE (UACMP) WITH MICROSCOPIC - Abnormal; Notable for the following components:   Color, Urine YELLOW (*)    APPearance CLEAR (*)    Glucose, UA >=500 (*)    Ketones, ur 5 (*)     Bacteria, UA MANY (*)    All other components within normal limits  CULTURE, BLOOD (ROUTINE X 2)  CULTURE, BLOOD (ROUTINE X 2)  URINE CULTURE  TROPONIN I  LACTIC ACID, PLASMA  TYPE AND SCREEN   ____________________________________________  EKG  ED ECG REPORT I, Lisha Vitale J, the attending physician, personally viewed and  interpreted this ECG.   Date: 03/14/2018  EKG Time: 0559  Rate: 70  Rhythm: normal EKG, normal sinus rhythm  Axis: LAD  Intervals:right bundle branch block  ST&T Change: Nonspecific  ____________________________________________  RADIOLOGY  ED MD interpretation: Atelectasis versus scarring  Official radiology report(s): Dg Chest Port 1 View  Result Date: 03/14/2018 CLINICAL DATA:  Shortness of breath. Recent left hip replacement. EXAM: PORTABLE CHEST 1 VIEW COMPARISON:  01/24/2018 FINDINGS: Lower lung volumes from prior exam. Left-sided pacemaker in place. The heart is enlarged. Unchanged mediastinal contours. Streaky retrocardiac atelectasis or scarring. No confluent airspace disease. No pulmonary edema, large pleural effusion or pneumothorax. Bones are diffusely under mineralized. IMPRESSION: Low lung volumes.  Streaky retrocardiac atelectasis or scarring. Cardiomegaly. Electronically Signed   By: Keith Rake M.D.   On: 03/14/2018 06:30    ____________________________________________   PROCEDURES  Procedure(s) performed: None  Procedures  Critical Care performed: Yes, see critical care note(s)  CRITICAL CARE Performed by: Paulette Blanch   Total critical care time: 45 minutes  Critical care time was exclusive of separately billable procedures and treating other patients.  Critical care was necessary to treat or prevent imminent or life-threatening deterioration.  Critical care was time spent personally by me on the following activities: development of treatment plan with patient and/or surrogate as well as nursing, discussions with  consultants, evaluation of patient's response to treatment, examination of patient, obtaining history from patient or surrogate, ordering and performing treatments and interventions, ordering and review of laboratory studies, ordering and review of radiographic studies, pulse oximetry and re-evaluation of patient's condition. ____________________________________________   INITIAL IMPRESSION / ASSESSMENT AND PLAN / ED COURSE  As part of my medical decision making, I reviewed the following data within the [place] notes reviewed and incorporated, Labs reviewed, EKG interpreted, Old chart reviewed, Radiograph reviewed and Notes from prior ED visits   84 year old female with COPD, CHF who presents with shortness of breath; recent to left hip surgery. Differential includes, but is not limited to, viral syndrome, bronchitis including COPD exacerbation, pneumonia, reactive airway disease including asthma, CHF including exacerbation with or without pulmonary/interstitial edema, pneumothorax, ACS, thoracic trauma, and pulmonary embolism.  Will obtain sepsis blood work including lactate.  If kidney function allows, will obtain CTA chest to evaluate for PE.  Patient's room air oxygenation is 87%.  Anticipate hospitalization.  Clinical Course as of Mar 15 727  Nancy Fetter Mar 14, 2018  0653 Updated patient and son who is at bedside of laboratory results.  Rectal exam reveals tan stool on gloved finger which is immediately heme positive.  Patient is on Eliquis for atrial fibrillation.  Will send for CTA to evaluate for pulmonary embolus.  I have discussed this case with the hospitalist who will evaluate patient in the emergency department for admission.   [JS]    Clinical Course User Index [JS] Paulette Blanch, MD     ____________________________________________   FINAL CLINICAL IMPRESSION(S) / ED DIAGNOSES  Final diagnoses:  SOB (shortness of breath)  Anemia, unspecified type    Gastrointestinal hemorrhage, unspecified gastrointestinal hemorrhage type  Hypoxia     ED Discharge Orders    None       Note:  This document was prepared using Dragon voice recognition software and may include unintentional dictation errors.    Paulette Blanch, MD 03/14/18 [phone]

## 2018-03-15 DIAGNOSIS — J9601 Acute respiratory failure with hypoxia: Secondary | ICD-10-CM | POA: Diagnosis not present

## 2018-03-15 DIAGNOSIS — I10 Essential (primary) hypertension: Secondary | ICD-10-CM | POA: Diagnosis not present

## 2018-03-15 DIAGNOSIS — I4891 Unspecified atrial fibrillation: Secondary | ICD-10-CM | POA: Diagnosis not present

## 2018-03-15 DIAGNOSIS — M6281 Muscle weakness (generalized): Secondary | ICD-10-CM | POA: Diagnosis not present

## 2018-03-15 DIAGNOSIS — I6200 Nontraumatic subdural hemorrhage, unspecified: Secondary | ICD-10-CM | POA: Diagnosis not present

## 2018-03-15 DIAGNOSIS — S72009A Fracture of unspecified part of neck of unspecified femur, initial encounter for closed fracture: Secondary | ICD-10-CM | POA: Diagnosis not present

## 2018-03-15 DIAGNOSIS — Z96642 Presence of left artificial hip joint: Secondary | ICD-10-CM | POA: Diagnosis not present

## 2018-03-15 LAB — CBC
HCT: 30.4 % — ABNORMAL LOW (ref 36.0–46.0)
Hemoglobin: 9.1 g/dL — ABNORMAL LOW (ref 12.0–15.0)
MCH: 27.2 pg (ref 26.0–34.0)
MCHC: 29.9 g/dL — ABNORMAL LOW (ref 30.0–36.0)
MCV: 91 fL (ref 80.0–100.0)
Platelets: 532 10*3/uL — ABNORMAL HIGH (ref 150–400)
RBC: 3.34 MIL/uL — ABNORMAL LOW (ref 3.87–5.11)
RDW: 15.5 % (ref 11.5–15.5)
WBC: 14.6 10*3/uL — ABNORMAL HIGH (ref 4.0–10.5)
nRBC: 0 % (ref 0.0–0.2)

## 2018-03-15 LAB — SURGICAL PATHOLOGY

## 2018-03-15 MED ORDER — TRAMADOL HCL 50 MG PO TABS: 50.0000 mg | ORAL_TABLET | ORAL | 1 refills | Status: DC | PRN

## 2018-03-15 NOTE — Care Management (Addendum)
[place] with [facility] home health notified of patient discharge to home today. RNCM left message for son [name] to call [facility] with any questions.

## 2018-03-15 NOTE — Progress Notes (Signed)
Discharge instructions given to pt and son. IVs removed. Pt dressed and will discharge back home with son.

## 2018-03-15 NOTE — Progress Notes (Signed)
Son requested low bed to be removed from room. Pt complained of being uncomfortable. It was explained to son it is protocol for her to be on a low bed because of her history of falls and intermittent confusion

## 2018-03-15 NOTE — Discharge Summary (Signed)
[facility] [place] NAME: Sarah Dougherty    MR#:  [id number]  DATE OF BIRTH:  [birth year]  DATE OF ADMISSION:  03/14/2018 ADMITTING PHYSICIAN: Henreitta Leber, MD  DATE OF DISCHARGE: 03/15/2018 11:08 AM  PRIMARY CARE PHYSICIAN: Perrin Maltese, MD    ADMISSION DIAGNOSIS:  SOB (shortness of breath) [R06.02] Hypoxia [R09.02] Gastrointestinal hemorrhage, unspecified gastrointestinal hemorrhage type [K92.2] Anemia, unspecified type [D64.9]  DISCHARGE DIAGNOSIS:  Active Problems:   Acute respiratory failure with hypoxia ([place])   SECONDARY DIAGNOSIS:   Past Medical History:  Diagnosis Date  . A-fib ([facility])   . Arthritis    Osteoarthritis  . Asthma   . Cancer (HCC)    PAROTID GLAND  . CHF (congestive heart failure) ([place])   . Chronic kidney disease     / CKD STAGE 3  . COPD (chronic obstructive pulmonary disease) ([place])   . Coronary artery disease   . Diabetes mellitus without complication ([place])   . Dysrhythmia    CHRONIC AFIB  . GERD (gastroesophageal reflux disease)   . Glaucoma (increased eye pressure)   . Hip fracture ([place]) 01/24/2018  . HOH (hard of hearing)    RIGHT HEARING AID  . Hypertension   . Hyperthyroidism   . Presence of permanent cardiac pacemaker   . Sleep apnea    No C-PAP/ NO LONGER WEARS OXYGEN    HOSPITAL COURSE:   84 year old female with past medical history of atrial fibrillation, COPD, hypertension, diabetes, osteoarthritis, history of CHF, status post recent left hip replacement who presents to the hospital secondary to shortness of breath.  1.  Acute respiratory failure with hypoxia- suspected to be secondary to mild atelectasis/likely underlying COPD but no acute exacerbation.  Given her recent hip surgery patient underwent CT of the chest which was negative for pulmonary embolism.   -Clinically patient had no evidence of congestive heart failure or pneumonia. -Patient was given incentive spirometer,  given some as needed duo nebs.  Day after admission patient was no longer hypoxic and felt better.  She did not qualify for home oxygen.  2.  Status post recent left hip replacement- patient was seen by physical therapy and will resume her home health therapy upon discharge. - Continue Eliquis for DVT prophylaxis.  3.  Diabetes type 2 without complication- she will continue glimepiride.  4.  Essential hypertension- she will continue Norvasc.  5.  History of atrial fibrillation-rate controlled.  Continue amiodarone.  6.  Glaucoma-continue Alphagan eyedrops.   DISCHARGE CONDITIONS:   Stable   CONSULTS OBTAINED:    DRUG ALLERGIES:   Allergies  Allergen Reactions  . Amoxicillin Other (See Comments)    unknown  . Cephalexin Other (See Comments)    unknown  . Nitrofurantoin Other (See Comments)     Unknown  . Penicillins Other (See Comments)    Other reaction(s): Unknown  . Sulfa Antibiotics Hives and Itching  . Atorvastatin     Other reaction(s): Muscle Pain  . Azithromycin Rash    DISCHARGE MEDICATIONS:   Allergies as of 03/15/2018      Reactions   Amoxicillin Other (See Comments)   unknown   Cephalexin Other (See Comments)   unknown   Nitrofurantoin Other (See Comments)    Unknown   Penicillins Other (See Comments)   Other reaction(s): Unknown   Sulfa Antibiotics Hives, Itching   Atorvastatin    Other reaction(s): Muscle Pain   Azithromycin Rash  Medication List    STOP taking these medications   oxyCODONE 5 MG immediate release tablet Commonly known as:  Oxy IR/ROXICODONE     TAKE these medications   albuterol 108 (90 Base) MCG/ACT inhaler Commonly known as:  PROVENTIL HFA;VENTOLIN HFA Inhale 1 puff into the lungs every 6 (six) hours as needed for wheezing or shortness of breath.   amiodarone 100 MG tablet Commonly known as:  PACERONE Take 1 tablet (100 mg total) by mouth daily.   amLODipine 5 MG tablet Commonly known as:  NORVASC Take  1 tablet by mouth daily.   apixaban 2.5 MG Tabs tablet Commonly known as:  ELIQUIS Take 1 tablet (2.5 mg total) by mouth 2 (two) times daily.   brimonidine 0.2 % ophthalmic solution Commonly known as:  ALPHAGAN Place 1 drop into the left eye 2 (two) times daily.   CRANBERRY PO Take 1 capsule by mouth daily.   DERMACLOUD Crea Apply liberal amount to area of skin irritation every shift.  Okay to leave at bedside   glimepiride 2 MG tablet Commonly known as:  AMARYL Take 1 tablet (2 mg total) by mouth daily.   metroNIDAZOLE 0.75 % vaginal gel Commonly known as:  METROGEL Place 1 Applicatorful vaginally at bedtime.   sodium chloride 0.65 % nasal spray Commonly known as:  OCEAN Place 2 sprays into the nose daily as needed.   traMADol 50 MG tablet Commonly known as:  ULTRAM Take 1 tablet (50 mg total) by mouth every 4 (four) hours as needed for moderate pain. What changed:  how much to take   travoprost (benzalkonium) 0.004 % ophthalmic solution Commonly known as:  TRAVATAN Place 1 drop into the left eye at bedtime.   TYLENOL 8 HOUR 650 MG CR tablet Generic drug:  acetaminophen Take 650 mg by mouth every 8 (eight) hours as needed for pain.   Vitamin D 50 MCG (2000 UT) tablet Take 2,000 Units by mouth daily.         DISCHARGE INSTRUCTIONS:   DIET:  Cardiac diet and Diabetic diet  DISCHARGE CONDITION:  Stable  ACTIVITY:  Activity as tolerated  OXYGEN:  Home Oxygen: No.   Oxygen Delivery: room air  DISCHARGE LOCATION:  Home with Home Health PT, OT, RN.    If you experience worsening of your admission symptoms, develop shortness of breath, life threatening emergency, suicidal or homicidal thoughts you must seek medical attention immediately by calling 911 or calling your MD immediately  if symptoms less severe.  You Must read complete instructions/literature along with Dougherty the possible adverse reactions/side effects for Dougherty the Medicines you take and that  have been prescribed to you. Take any new Medicines after you have completely understood and accpet Dougherty the possible adverse reactions/side effects.   Please note  You were cared for by a hospitalist during your hospital stay. If you have any questions about your discharge medications or the care you received while you were in the hospital after you are discharged, you can call the unit and asked to speak with the hospitalist on call if the hospitalist that took care of you is not available. Once you are discharged, your primary care physician will handle any further medical issues. Please note that NO REFILLS for any discharge medications will be authorized once you are discharged, as it is imperative that you return to your primary care physician (or establish a relationship with a primary care physician if you do not have one) for your aftercare  needs so that they can reassess your need for medications and monitor your lab values.     Today   No acute events overnight, patient feels better.  Son is at bedside.  No nausea vomiting.  Weaned off oxygen is no longer hypoxic.  Will discharge home with home health services today.  VITAL SIGNS:  Blood pressure 109/61, pulse 66, temperature 98 F (36.7 C), temperature source Oral, resp. rate 19, height 5\' 1"  (1.549 m), weight 45.8 kg, SpO2 95 %.  I/O:    Intake/Output Summary (Last 24 hours) at 03/15/2018 1520 Last data filed at 03/15/2018 0900 Gross per 24 hour  Intake 237.51 ml  Output 2000 ml  Net -1762.49 ml    PHYSICAL EXAMINATION:   GENERAL:  84 y.o.-year-old thin patient lying in the bed in no acute distress.  EYES: Pupils equal, round, reactive to light and accommodation. No scleral icterus. Extraocular muscles intact. Blind in left eye.  HEENT: Head atraumatic, normocephalic. Oropharynx and nasopharynx clear. No oropharyngeal erythema, moist oral mucosa  NECK:  Supple, no jugular venous distention. No thyroid enlargement, no  tenderness.  LUNGS: Normal breath sounds bilaterally, no wheezing, rales, rhonchi. No use of accessory muscles of respiration.  CARDIOVASCULAR: S1, S2 RRR. No murmurs, rubs, gallops, clicks.  ABDOMEN: Soft, nontender, nondistended. Bowel sounds present. No organomegaly or mass.  EXTREMITIES: No pedal edema, cyanosis, or clubbing. + 2 pedal & radial pulses b/l.  Left hip dressing in place from recent left hip replacement.  NEUROLOGIC: Cranial nerves II through XII are intact. No focal Motor or sensory deficits appreciated b/l PSYCHIATRIC: The patient is alert and oriented x 2. SKIN: No obvious rash, lesion, or ulcer.    DATA REVIEW:   CBC Recent Labs  Lab 03/15/18 0255  WBC 14.6*  HGB 9.1*  HCT 30.4*  PLT 532*    Chemistries  Recent Labs  Lab 03/14/18 0608  NA 138  K 3.2*  CL 99  CO2 29  GLUCOSE 243*  BUN 11  CREATININE 0.57  CALCIUM 8.3*  MG 1.7  AST 14*  ALT 13  ALKPHOS 85  BILITOT 1.0    Cardiac Enzymes Recent Labs  Lab 03/14/18 0608  TROPONINI <0.03    Microbiology Results  Results for orders placed or performed during the hospital encounter of 03/14/18  Culture, blood (routine x 2)     Status: None (Preliminary result)   Collection Time: 03/14/18  6:08 AM  Result Value Ref Range Status   Specimen Description BLOOD R AC  Final   Special Requests   Final    BOTTLES DRAWN AEROBIC AND ANAEROBIC Blood Culture results may not be optimal due to an excessive volume of blood received in culture bottles   Culture   Final    NO GROWTH 1 DAY Performed at [facility], [address] [place], [place] [address]    Report Status PENDING  Incomplete  Culture, blood (routine x 2)     Status: None (Preliminary result)   Collection Time: 03/14/18  6:08 AM  Result Value Ref Range Status   Specimen Description BLOOD R HAND  Final   Special Requests   Final    BOTTLES DRAWN AEROBIC AND ANAEROBIC Blood Culture results may not be optimal due to an excessive  volume of blood received in culture bottles   Culture   Final    NO GROWTH 1 DAY Performed at [facility], [address], [place], [place] [address]    Report  Status PENDING  Incomplete  Urine culture     Status: Abnormal (Preliminary result)   Collection Time: 03/14/18  6:08 AM  Result Value Ref Range Status   Specimen Description   Final    URINE, RANDOM Performed at [facility], [address], [place], [place] [address]    Special Requests   Final    NONE Performed at [facility], [address] [place],  [address]    Culture >=100,000 COLONIES/mL ESCHERICHIA COLI (A)  Final   Report Status PENDING  Incomplete    RADIOLOGY:  Ct Angio Chest Pe W/cm &/or Wo Cm  Result Date: 03/14/2018 CLINICAL DATA:  Shortness of breath.  Hip surgery 4 days ago. EXAM: CT ANGIOGRAPHY CHEST WITH CONTRAST TECHNIQUE: Multidetector CT imaging of the chest was performed using the standard protocol during bolus administration of intravenous contrast. Multiplanar CT image reconstructions and MIPs were obtained to evaluate the vascular anatomy. CONTRAST:  42mL OMNIPAQUE IOHEXOL 350 MG/ML SOLN COMPARISON:  CT chest 02/17/2014 FINDINGS: Cardiovascular: There is moderate multi chamber cardiac enlargement. Aortic atherosclerosis. Left chest wall pacer device is noted with leads in the right atrial appendage and right ventricle. The main pulmonary artery is patent. No saddle embolus. No lobar or segmental pulmonary artery filling defects identified Mediastinum/Nodes: Normal appearance of the thyroid gland. The trachea appears patent and is midline. Small to moderate hiatal hernia noted. No axillary or supraclavicular adenopathy. No mediastinal or hilar adenopathy. Lungs/Pleura: No pleural effusions. Bilateral lower lobe subsegmental atelectasis identified. Diffuse bronchial wall thickening. No airspace consolidation. Nodule in the left lower lobe appears nonspecific measuring 1.2  cm, image 64/6. New from 02/17/2014. Upper Abdomen: No acute abnormalities.  Right lobe of liver cyst. Musculoskeletal: No chest wall abnormality. No acute or significant osseous findings. Old healed left rib deformities. Review of the MIP images confirms the above findings. IMPRESSION: 1. No evidence for acute pulmonary embolus. 2. Bibasilar subsegmental atelectasis. 3. Left lower lobe nodule measures 1.2 cm. Nonspecific. This may be related to the lower lobe atelectasis. Recommend repeat chest CT in 3 months to ensure resolution. If this does not resolve then consider further evaluation with PET-CT versus multi disciplinary thoracic consultation. 4.  Aortic Atherosclerosis (ICD10-I70.0). Electronically Signed   By: Kerby Moors M.D.   On: 03/14/2018 08:18   Dg Chest Port 1 View  Result Date: 03/14/2018 CLINICAL DATA:  Shortness of breath. Recent left hip replacement. EXAM: PORTABLE CHEST 1 VIEW COMPARISON:  01/24/2018 FINDINGS: Lower lung volumes from prior exam. Left-sided pacemaker in place. The heart is enlarged. Unchanged mediastinal contours. Streaky retrocardiac atelectasis or scarring. No confluent airspace disease. No pulmonary edema, large pleural effusion or pneumothorax. Bones are diffusely under mineralized. IMPRESSION: Low lung volumes.  Streaky retrocardiac atelectasis or scarring. Cardiomegaly. Electronically Signed   By: Keith Rake M.D.   On: 03/14/2018 06:30      Management plans discussed with the patient, family and they are in agreement.  CODE STATUS:  Code Status History    Date Active Date Inactive Code Status Order ID Comments User Context   03/14/2018 1006 03/15/2018 1408 DNR [id number]  Henreitta Leber, MD Inpatient  Questions for Most Recent Historical Code Status (Order [id number])    Question Answer Comment   In the event of cardiac or respiratory ARREST Do not call a "code blue"    In the event of cardiac or respiratory ARREST Do not perform Intubation, CPR,  defibrillation or ACLS    In the event  of cardiac or respiratory ARREST Use medication by any route, position, wound care, and other measures to relive pain and suffering. May use oxygen, suction and manual treatment of airway obstruction as needed for comfort.         Advance Directive Documentation     Most Recent Value  Type of Advance Directive  Healthcare Power of Attorney, Living will  Pre-existing out of facility DNR order (yellow form or pink MOST form)  -  "MOST" Form in Place?  -      TOTAL TIME TAKING CARE OF THIS PATIENT: 40 minutes.    Henreitta Leber M.D on 03/15/2018 at 3:20 PM  Between 7am to 6pm - Pager - [phone]  After 6pm go to [email or web address] - Proofreader  [facility]  Office  [phone]  CC: Primary care physician; Perrin Maltese, MD

## 2018-03-15 NOTE — Progress Notes (Signed)
Patient's son Sarah Dougherty stated that he can transport patient home today and wants to resume home health services with [facility] with a start date of tomorrow. RN case manager aware of above.   [employer], LCSW [phone]

## 2018-03-15 NOTE — Evaluation (Signed)
Physical Therapy Evaluation Patient Details Name: Sarah Dougherty MRN: [id number] DOB: 08-04-1921 Today's Date: 03/15/2018   History of Present Illness   84 y.o. female admitted for hypoxia and anemia,  with PMH coronary artery disease, congestive heart failure, hypertension, diabetes, gastroesophageal reflux disease, chronic atrial fibrillation and is 5 days status post a left hip hemiarthroplasty following a failed internal fixation of an intertrochanteric left hip fracture performed about 6 to 7 weeks ago, was previously discharged home 2 days ago.  Clinical Impression  Patient oriented to self and situation at start of session, mild complaints of LE pain due to reported knee OA. Per family and pt, in the last 2 months due to hip fx and following surgeries, pt needs minAx1 for ADLs/IADLs, and close supervision/CGA for household ambulation with RW. Prior to hip surgery, pt was mod I for ADLs/IADLs, and ambulated with SPC/RW as needed. Family available 24/7 to provide assistance as needed. Upon assessment patient able to participate in therapeutic exercises with mod tactile/verbal cues to assure movement quality/technique. Pt mobilized to EOB with minAx1, and transferred sit <> stand with modAx1 and RW. Pt ambulated ~77ft with RW and CGA/minAx1 for occasional AD management. Throughout transitional movements/abulation pt reported dizziness, see vitals below. PT and family discussed safety concerns at home for BP and physical assistance needed for safe mobility, family confident that they can provide adequate and safe assistance. The patient would benefit from skilled PT intervention s/p hip hemiarthroplasty to maximize mobility, safety, and independence. Recommendation is HHPT with 24/7 supervision, including all mobility/OOB.  Vitals in supine: 120/67, HR mid 70s, spO2 >92% on room air.  Sitting BP: 97/55, Hr and spO2 stable.  In standing at 0 and  20mins BP 95/53, 97/50 with still complaints of mild  dizziness. SpO2 during ambulation uncertain due to pulse oximeter, >95% in sitting post ambulation. Pt no longer dizzy and vitals stable at end of session in chair. Nursing made aware of pt's BP response due to change in position.     Follow Up Recommendations Home health PT;Supervision for mobility/OOB;Supervision/Assistance - 24 hour    Equipment Recommendations  Other (comment)(Pr family, pt has RW, WC, bedside commode at home)    Recommendations for Other Services       Precautions / Restrictions Precautions Precautions: Fall;Posterior Hip Precaution Comments: contact iso Restrictions Weight Bearing Restrictions: Yes LLE Weight Bearing: Weight bearing as tolerated      Mobility  Bed Mobility Overal bed mobility: Needs Assistance Bed Mobility: Supine to Sit     Supine to sit: Min assist     General bed mobility comments: for trunk elevation  Transfers Overall transfer level: Needs assistance Equipment used: Rolling walker (2 wheeled) Transfers: Sit to/from Stand Sit to Stand: Mod assist         General transfer comment: Pt needed several attempts to gather momentum, still modAx1 to achieve weight shift to stand.  Ambulation/Gait Ambulation/Gait assistance: Min guard Gait Distance (Feet): 25 Feet Assistive device: Rolling walker (2 wheeled) Gait Pattern/deviations: Step-to pattern     General Gait Details: Unsteadiness noted, occasional assistance needed for guidance of RW. complained of dizziness throughout, poor signal from pulse ox. BP in standing 97/53.  Stairs            Wheelchair Mobility    Modified Rankin (Stroke Patients Only)       Balance Overall balance assessment: Needs assistance Sitting-balance support: Feet supported Sitting balance-Leahy Scale: Fair     Standing balance support: Bilateral upper  extremity supported Standing balance-Leahy Scale: Poor                               Pertinent Vitals/Pain Pain  Assessment: Faces Faces Pain Scale: Hurts a little bit    Home Living Family/patient expects to be discharged to:: Private residence Living Arrangements: Children Available Help at Discharge: Family;Available 24 hours/day Type of Home: House Home Access: Ramped entrance     Home Layout: One level Home Equipment: Walker - 2 wheels;Walker - 4 wheels;Cane - single point;Toilet riser;Shower seat;Wheelchair - manual      Prior Function Level of Independence: Needs assistance   Gait / Transfers Assistance Needed: Pt prior to hip surgery ambulated mod I with SPC/RW as needed. Last 2 months pt needs close supervision and RW at home  ADL's / Homemaking Assistance Needed: Pt prior to hip surgery independent with ADLs, did participate with cooking/cleaning, household activities. Last 2 months pt has needed minAx1 to perform ADLs        Hand Dominance   Dominant Hand: Right    Extremity/Trunk Assessment   Upper Extremity Assessment Upper Extremity Assessment: Generalized weakness    Lower Extremity Assessment Lower Extremity Assessment: Generalized weakness       Communication   Communication: HOH  Cognition Arousal/Alertness: Awake/alert Behavior During Therapy: WFL for tasks assessed/performed Overall Cognitive Status: Within Functional Limits for tasks assessed                                 General Comments: Had difficulty following commands for exercises but much improved with tactile cues      General Comments      Exercises General Exercises - Lower Extremity Ankle Circles/Pumps: AROM;Both;20 reps Heel Slides: AROM;Strengthening;Both;20 reps Hip ABduction/ADduction: AROM;Strengthening;Left;15 reps Straight Leg Raises: AROM;Strengthening;Left;15 reps   Assessment/Plan    PT Assessment Patient needs continued PT services  PT Problem List Decreased strength;Decreased activity tolerance;Decreased balance;Decreased range of motion;Decreased  mobility;Decreased coordination;Decreased cognition;Decreased knowledge of use of DME;Decreased knowledge of precautions;Decreased safety awareness       PT Treatment Interventions DME instruction;Gait training;Functional mobility training;Therapeutic activities;Therapeutic exercise;Balance training;Patient/family education    PT Goals (Current goals can be found in the Care Plan section)  Acute Rehab PT Goals Patient Stated Goal: to go home PT Goal Formulation: With patient/family Time For Goal Achievement: 03/29/18 Potential to Achieve Goals: Good    Frequency Min 2X/week   Barriers to discharge        Co-evaluation               AM-PAC PT "6 Clicks" Mobility  Outcome Measure Help needed turning from your back to your side while in a flat bed without using bedrails?: A Little Help needed moving from lying on your back to sitting on the side of a flat bed without using bedrails?: A Little Help needed moving to and from a bed to a chair (including a wheelchair)?: A Lot Help needed standing up from a chair using your arms (e.g., wheelchair or bedside chair)?: A Lot Help needed to walk in hospital room?: A Lot Help needed climbing 3-5 steps with a railing? : Total 6 Click Score: 13    End of Session Equipment Utilized During Treatment: Gait belt Activity Tolerance: Treatment limited secondary to medical complications (Comment);Other (comment);No increased pain(orthostatic, dizziness) Patient left: in chair;with call bell/phone within reach;with chair alarm set;with  family/visitor present Nurse Communication: Mobility status;Other (comment)(orthostatic with mobility) PT Visit Diagnosis: Muscle weakness (generalized) (M62.81);Difficulty in walking, not elsewhere classified (R26.2);Pain Pain - Right/Left: Left Pain - part of body: Hip    Time: 0930-1006 PT Time Calculation (min) (ACUTE ONLY): 36 min   Charges:   PT Evaluation $PT Eval Moderate Complexity: 1 Mod PT  Treatments $Therapeutic Activity: 8-22 mins       Lieutenant Diego PT, DPT 10:20 AM,03/15/18 [phone]

## 2018-03-16 ENCOUNTER — Other Ambulatory Visit: Payer: Self-pay

## 2018-03-16 LAB — URINE CULTURE: Culture: >=100000 — AB

## 2018-03-16 NOTE — Patient Outreach (Signed)
[facility]) Care Management  Sarah Dougherty   03/16/2018  Sarah Dougherty Jan 30, 1922 [id number]   Successful outreach to Sarah Dougherty's son, Sarah Dougherty.  HIPAA identifiers verified.  Reason for referral: 30 day post discharge medication review  Current insurance:HTA  PMHx:  Hypertension, atrial fibrillation, heart failure, COPD, asthma, GERD, h/o parotid cancer, type 2 diabetes mellitus, CKD Stage III, h/o hip fracture and hyperlipidemia.  HPI: Patient's son reports that his mom has stopped tramadol and is taking only APAP for pain.  He states it was giving her insomnia at night and she was sleeping all day and not eating.  He states she is doing much better without it.  He reports checking her glucose at least three times day, but cannot tell me the values.  He states that it is never "low".  She is currently receiving Eliquis samples from Dr. Humphrey Rolls and the nurse cuts the tablets to make the 2.5 mg dose for the patient.  She sees her PCP on 12/9.     Objective: Lab Results  Component Value Date   CREATININE 0.57 03/14/2018   CREATININE 0.70 03/10/2018   CREATININE 0.76 01/25/2018    Lab Results  Component Value Date   HGBA1C 8.2 (H) 01/06/2018    Lipid Panel  No results found for: CHOL, TRIG, HDL, CHOLHDL, VLDL, LDLCALC, LDLDIRECT  BP Readings from Last 3 Encounters:  03/15/18 109/61  03/13/18 (!) 127/52  02/09/18 (!) 150/69    Allergies  Allergen Reactions  . Amoxicillin Other (See Comments)    unknown  . Cephalexin Other (See Comments)    unknown  . Nitrofurantoin Other (See Comments)     Unknown  . Penicillins Other (See Comments)    Other reaction(s): Unknown  . Sulfa Antibiotics Hives and Itching  . Atorvastatin     Other reaction(s): Muscle Pain  . Azithromycin Rash    Medications Reviewed Today    Reviewed by Dionne Milo, [facility] (Pharmacist) on 03/16/18 at 1014  Med List Status: <None>  Medication Order Taking? Sig Documenting  Provider Last Dose Status Informant  acetaminophen (TYLENOL 8 HOUR) 650 MG CR tablet [id number] Yes Take 650 mg by mouth every 8 (eight) hours as needed for pain. [provider] Taking Active Family Member  albuterol (PROAIR HFA) 108 (90 Base) MCG/ACT inhaler [id number] Yes Inhale 1 puff into the lungs every 6 (six) hours as needed for wheezing or shortness of breath. Gerlene Fee, NP Taking Active Family Member  amiodarone (PACERONE) 100 MG tablet [id number] Yes Take 1 tablet (100 mg total) by mouth daily. Gerlene Fee, NP Taking Active Family Member  amLODipine (NORVASC) 5 MG tablet [id number] Yes Take 1 tablet by mouth daily. [provider] Taking Active Family Member  apixaban (ELIQUIS) 2.5 MG TABS tablet [id number] Yes Take 1 tablet (2.5 mg total) by mouth 2 (two) times daily.  Patient taking differently:  Take 2.5 mg by mouth 2 (two) times daily. Pt gets 5 mg tablets as samples from Dr. Humphrey Rolls and the office halves them for the family to make the 2.5 mg dose.   Gerlene Fee, NP Taking Active Family Member  brimonidine [facility]) 0.2 % ophthalmic solution [id number] Yes Place 1 drop into the left eye 2 (two) times daily. Gerlene Fee, NP Taking Active Family Member  Cholecalciferol (VITAMIN D) 2000 units tablet [id number] Yes Take 2,000 Units by mouth daily. [provider] Taking Active Family Member  CRANBERRY PO [id number] Yes Take 1  capsule by mouth daily. [provider] Taking Active Family Member  glimepiride (AMARYL) 2 MG tablet [id number] Yes Take 1 tablet (2 mg total) by mouth daily. Gerlene Fee, NP Taking Active Family Member  [place]) CREA [id number] Yes Apply liberal amount to area of skin irritation every shift.  Okay to leave at bedside [provider] Taking Active Family Member  metroNIDAZOLE (METROGEL) 0.75 % vaginal gel [id number] Yes Place 1 Applicatorful vaginally at bedtime. [provider] Taking Active Family Member  nystatin cream (MYCOSTATIN) [id number] Yes Apply 1 application topically 2 (two) times daily. For vaginal itching [provider] Taking Active Child  sodium chloride (OCEAN) 0.65 % nasal spray [id number] Yes Place 2 sprays into the nose daily as needed. [provider] Taking Active Family Member           Med Note Glennon Mac, Lanna Poche Apr 07, 2016  4:11 PM)    travoprost, benzalkonium, (TRAVATAN) 0.004 % ophthalmic solution [id number] Yes Place 1 drop into the left eye at bedtime. Gerlene Fee, NP Taking Active Family Member          ASSESSMENT: Date Discharged from Hospital: 03/15/18 Date Medication Reconciliation Performed: 03/16/2018  Medications Discontinued at Discharge:   oxycodone  New Medications at Discharge:   tramadol  Patient was recently discharged from hospital and all medications have been reviewed.  Drugs sorted by system:  Cardiovascular: amiodarone, amlodipine, apixaban  Pulmonary/Allergy:albuterol MDI  Endocrine: glimepiride  Topical: brimonidine opth., travoprost opth.  Pain: acetaminophen  Infectious Diseases: metronidazole vaginal, nystatin cream, ocean nasal spary  Vitamins/Minerals/Supplements: cholecalciferol,   Miscellaneous: cranberry  Medication Review Findings:  . Per the [place] List, glimepiride has a higher risk of sever prolonged hypoglycemia in older adults.  There is strong recommendation to avoid use in the elderly. Lorrene Reid the son to encourage his mom to get a flu vaccine.  PLAN: Route note to PCP, Dr. Humphrey Rolls.  Joetta Manners, [place] [phone]

## 2018-03-17 DIAGNOSIS — C07 Malignant neoplasm of parotid gland: Secondary | ICD-10-CM | POA: Diagnosis not present

## 2018-03-17 DIAGNOSIS — M1990 Unspecified osteoarthritis, unspecified site: Secondary | ICD-10-CM | POA: Diagnosis not present

## 2018-03-17 DIAGNOSIS — E039 Hypothyroidism, unspecified: Secondary | ICD-10-CM | POA: Diagnosis not present

## 2018-03-17 DIAGNOSIS — K219 Gastro-esophageal reflux disease without esophagitis: Secondary | ICD-10-CM | POA: Diagnosis not present

## 2018-03-17 DIAGNOSIS — N183 Chronic kidney disease, stage 3 (moderate): Secondary | ICD-10-CM | POA: Diagnosis not present

## 2018-03-17 DIAGNOSIS — I509 Heart failure, unspecified: Secondary | ICD-10-CM | POA: Diagnosis not present

## 2018-03-17 DIAGNOSIS — T84091D Other mechanical complication of internal left hip prosthesis, subsequent encounter: Secondary | ICD-10-CM | POA: Diagnosis not present

## 2018-03-17 DIAGNOSIS — J449 Chronic obstructive pulmonary disease, unspecified: Secondary | ICD-10-CM | POA: Diagnosis not present

## 2018-03-17 DIAGNOSIS — I130 Hypertensive heart and chronic kidney disease with heart failure and stage 1 through stage 4 chronic kidney disease, or unspecified chronic kidney disease: Secondary | ICD-10-CM | POA: Diagnosis not present

## 2018-03-17 DIAGNOSIS — I2510 Atherosclerotic heart disease of native coronary artery without angina pectoris: Secondary | ICD-10-CM | POA: Diagnosis not present

## 2018-03-17 DIAGNOSIS — E1122 Type 2 diabetes mellitus with diabetic chronic kidney disease: Secondary | ICD-10-CM | POA: Diagnosis not present

## 2018-03-17 DIAGNOSIS — S72142D Displaced intertrochanteric fracture of left femur, subsequent encounter for closed fracture with routine healing: Secondary | ICD-10-CM | POA: Diagnosis not present

## 2018-03-17 DIAGNOSIS — I482 Chronic atrial fibrillation, unspecified: Secondary | ICD-10-CM | POA: Diagnosis not present

## 2018-03-19 LAB — CULTURE, BLOOD (ROUTINE X 2)
Culture: NO GROWTH
Culture: NO GROWTH

## 2018-04-04 ENCOUNTER — Emergency Department: Payer: PPO

## 2018-04-04 ENCOUNTER — Inpatient Hospital Stay
Admission: EM | Admit: 2018-04-04 | Discharge: 2018-04-14 | DRG: 871 | Disposition: E | Payer: PPO | Attending: Pulmonary Disease | Admitting: Pulmonary Disease

## 2018-04-04 DIAGNOSIS — I959 Hypotension, unspecified: Secondary | ICD-10-CM | POA: Diagnosis not present

## 2018-04-04 DIAGNOSIS — K920 Hematemesis: Secondary | ICD-10-CM | POA: Diagnosis not present

## 2018-04-04 DIAGNOSIS — H9190 Unspecified hearing loss, unspecified ear: Secondary | ICD-10-CM | POA: Diagnosis present

## 2018-04-04 DIAGNOSIS — D689 Coagulation defect, unspecified: Secondary | ICD-10-CM | POA: Diagnosis not present

## 2018-04-04 DIAGNOSIS — T45515A Adverse effect of anticoagulants, initial encounter: Secondary | ICD-10-CM | POA: Diagnosis not present

## 2018-04-04 DIAGNOSIS — G4730 Sleep apnea, unspecified: Secondary | ICD-10-CM | POA: Diagnosis present

## 2018-04-04 DIAGNOSIS — I2510 Atherosclerotic heart disease of native coronary artery without angina pectoris: Secondary | ICD-10-CM | POA: Diagnosis present

## 2018-04-04 DIAGNOSIS — Z833 Family history of diabetes mellitus: Secondary | ICD-10-CM

## 2018-04-04 DIAGNOSIS — R578 Other shock: Secondary | ICD-10-CM | POA: Diagnosis present

## 2018-04-04 DIAGNOSIS — K922 Gastrointestinal hemorrhage, unspecified: Secondary | ICD-10-CM | POA: Diagnosis present

## 2018-04-04 DIAGNOSIS — Z96641 Presence of right artificial hip joint: Secondary | ICD-10-CM | POA: Diagnosis present

## 2018-04-04 DIAGNOSIS — Z882 Allergy status to sulfonamides status: Secondary | ICD-10-CM

## 2018-04-04 DIAGNOSIS — G9341 Metabolic encephalopathy: Secondary | ICD-10-CM | POA: Diagnosis present

## 2018-04-04 DIAGNOSIS — Z90710 Acquired absence of both cervix and uterus: Secondary | ICD-10-CM

## 2018-04-04 DIAGNOSIS — Z7901 Long term (current) use of anticoagulants: Secondary | ICD-10-CM

## 2018-04-04 DIAGNOSIS — J449 Chronic obstructive pulmonary disease, unspecified: Secondary | ICD-10-CM | POA: Diagnosis present

## 2018-04-04 DIAGNOSIS — Z881 Allergy status to other antibiotic agents status: Secondary | ICD-10-CM

## 2018-04-04 DIAGNOSIS — E1122 Type 2 diabetes mellitus with diabetic chronic kidney disease: Secondary | ICD-10-CM | POA: Diagnosis present

## 2018-04-04 DIAGNOSIS — K5900 Constipation, unspecified: Secondary | ICD-10-CM | POA: Diagnosis present

## 2018-04-04 DIAGNOSIS — E1165 Type 2 diabetes mellitus with hyperglycemia: Secondary | ICD-10-CM | POA: Diagnosis not present

## 2018-04-04 DIAGNOSIS — N183 Chronic kidney disease, stage 3 (moderate): Secondary | ICD-10-CM | POA: Diagnosis present

## 2018-04-04 DIAGNOSIS — Z888 Allergy status to other drugs, medicaments and biological substances status: Secondary | ICD-10-CM

## 2018-04-04 DIAGNOSIS — A419 Sepsis, unspecified organism: Secondary | ICD-10-CM | POA: Diagnosis not present

## 2018-04-04 DIAGNOSIS — K921 Melena: Secondary | ICD-10-CM | POA: Diagnosis present

## 2018-04-04 DIAGNOSIS — I482 Chronic atrial fibrillation, unspecified: Secondary | ICD-10-CM | POA: Diagnosis not present

## 2018-04-04 DIAGNOSIS — Z4682 Encounter for fitting and adjustment of non-vascular catheter: Secondary | ICD-10-CM | POA: Diagnosis not present

## 2018-04-04 DIAGNOSIS — Z66 Do not resuscitate: Secondary | ICD-10-CM | POA: Diagnosis present

## 2018-04-04 DIAGNOSIS — Z9842 Cataract extraction status, left eye: Secondary | ICD-10-CM

## 2018-04-04 DIAGNOSIS — R571 Hypovolemic shock: Secondary | ICD-10-CM | POA: Diagnosis present

## 2018-04-04 DIAGNOSIS — Z515 Encounter for palliative care: Secondary | ICD-10-CM | POA: Diagnosis present

## 2018-04-04 DIAGNOSIS — J690 Pneumonitis due to inhalation of food and vomit: Secondary | ICD-10-CM | POA: Diagnosis not present

## 2018-04-04 DIAGNOSIS — J9600 Acute respiratory failure, unspecified whether with hypoxia or hypercapnia: Secondary | ICD-10-CM | POA: Diagnosis not present

## 2018-04-04 DIAGNOSIS — M1990 Unspecified osteoarthritis, unspecified site: Secondary | ICD-10-CM | POA: Diagnosis present

## 2018-04-04 DIAGNOSIS — Z880 Allergy status to penicillin: Secondary | ICD-10-CM

## 2018-04-04 DIAGNOSIS — E872 Acidosis: Secondary | ICD-10-CM | POA: Diagnosis present

## 2018-04-04 DIAGNOSIS — Z9049 Acquired absence of other specified parts of digestive tract: Secondary | ICD-10-CM

## 2018-04-04 DIAGNOSIS — Z9911 Dependence on respirator [ventilator] status: Secondary | ICD-10-CM | POA: Diagnosis not present

## 2018-04-04 DIAGNOSIS — Z950 Presence of cardiac pacemaker: Secondary | ICD-10-CM

## 2018-04-04 DIAGNOSIS — Z961 Presence of intraocular lens: Secondary | ICD-10-CM | POA: Diagnosis present

## 2018-04-04 DIAGNOSIS — R6521 Severe sepsis with septic shock: Secondary | ICD-10-CM | POA: Diagnosis present

## 2018-04-04 DIAGNOSIS — R404 Transient alteration of awareness: Secondary | ICD-10-CM | POA: Diagnosis not present

## 2018-04-04 DIAGNOSIS — N179 Acute kidney failure, unspecified: Secondary | ICD-10-CM | POA: Diagnosis not present

## 2018-04-04 DIAGNOSIS — K219 Gastro-esophageal reflux disease without esophagitis: Secondary | ICD-10-CM | POA: Diagnosis present

## 2018-04-04 DIAGNOSIS — I509 Heart failure, unspecified: Secondary | ICD-10-CM | POA: Diagnosis present

## 2018-04-04 DIAGNOSIS — E0590 Thyrotoxicosis, unspecified without thyrotoxic crisis or storm: Secondary | ICD-10-CM | POA: Diagnosis present

## 2018-04-04 DIAGNOSIS — Z79899 Other long term (current) drug therapy: Secondary | ICD-10-CM

## 2018-04-04 DIAGNOSIS — R52 Pain, unspecified: Secondary | ICD-10-CM | POA: Diagnosis not present

## 2018-04-04 DIAGNOSIS — R1111 Vomiting without nausea: Secondary | ICD-10-CM | POA: Diagnosis not present

## 2018-04-04 DIAGNOSIS — H409 Unspecified glaucoma: Secondary | ICD-10-CM | POA: Diagnosis present

## 2018-04-04 DIAGNOSIS — Z7984 Long term (current) use of oral hypoglycemic drugs: Secondary | ICD-10-CM

## 2018-04-04 DIAGNOSIS — R4020 Unspecified coma: Secondary | ICD-10-CM | POA: Diagnosis not present

## 2018-04-04 DIAGNOSIS — H5789 Other specified disorders of eye and adnexa: Secondary | ICD-10-CM | POA: Diagnosis present

## 2018-04-04 DIAGNOSIS — Z825 Family history of asthma and other chronic lower respiratory diseases: Secondary | ICD-10-CM

## 2018-04-04 DIAGNOSIS — K449 Diaphragmatic hernia without obstruction or gangrene: Secondary | ICD-10-CM | POA: Diagnosis present

## 2018-04-04 DIAGNOSIS — R4182 Altered mental status, unspecified: Secondary | ICD-10-CM | POA: Diagnosis not present

## 2018-04-04 DIAGNOSIS — I130 Hypertensive heart and chronic kidney disease with heart failure and stage 1 through stage 4 chronic kidney disease, or unspecified chronic kidney disease: Secondary | ICD-10-CM | POA: Diagnosis not present

## 2018-04-04 DIAGNOSIS — D62 Acute posthemorrhagic anemia: Secondary | ICD-10-CM | POA: Diagnosis present

## 2018-04-04 DIAGNOSIS — Z7189 Other specified counseling: Secondary | ICD-10-CM | POA: Diagnosis not present

## 2018-04-04 DIAGNOSIS — J9601 Acute respiratory failure with hypoxia: Secondary | ICD-10-CM | POA: Diagnosis present

## 2018-04-04 LAB — CBC
HCT: 17.4 % — ABNORMAL LOW (ref 36.0–46.0)
HCT: 33.4 % — ABNORMAL LOW (ref 36.0–46.0)
HCT: 31.1 % — ABNORMAL LOW (ref 36.0–46.0)
Hemoglobin: 4.6 g/dL — CL (ref 12.0–15.0)
Hemoglobin: 10.5 g/dL — ABNORMAL LOW (ref 12.0–15.0)
Hemoglobin: 10 g/dL — ABNORMAL LOW (ref 12.0–15.0)
MCH: 26.3 pg (ref 26.0–34.0)
MCH: 29.5 pg (ref 26.0–34.0)
MCH: 29.6 pg (ref 26.0–34.0)
MCHC: 26.4 g/dL — AB (ref 30.0–36.0)
MCHC: 31.4 g/dL (ref 30.0–36.0)
MCHC: 32.2 g/dL (ref 30.0–36.0)
MCV: 99.4 fL (ref 80.0–100.0)
MCV: 93.8 fL (ref 80.0–100.0)
MCV: 92 fL (ref 80.0–100.0)
NRBC: 1.2 % — AB (ref 0.0–0.2)
Platelets: 281 10*3/uL (ref 150–400)
Platelets: 140 10*3/uL — ABNORMAL LOW (ref 150–400)
Platelets: 135 10*3/uL — ABNORMAL LOW (ref 150–400)
RBC: 1.75 MIL/uL — ABNORMAL LOW (ref 3.87–5.11)
RBC: 3.56 MIL/uL — ABNORMAL LOW (ref 3.87–5.11)
RBC: 3.38 MIL/uL — ABNORMAL LOW (ref 3.87–5.11)
RDW: 16.8 % — AB (ref 11.5–15.5)
RDW: 13.7 % (ref 11.5–15.5)
RDW: 14.6 % (ref 11.5–15.5)
WBC: 21.4 10*3/uL — ABNORMAL HIGH (ref 4.0–10.5)
WBC: 25.2 10*3/uL — ABNORMAL HIGH (ref 4.0–10.5)
WBC: 21 10*3/uL — ABNORMAL HIGH (ref 4.0–10.5)
nRBC: 0.6 % — ABNORMAL HIGH (ref 0.0–0.2)
nRBC: 0.8 % — ABNORMAL HIGH (ref 0.0–0.2)

## 2018-04-04 LAB — MAGNESIUM: Magnesium: 1.8 mg/dL (ref 1.7–2.4)

## 2018-04-04 LAB — COMPREHENSIVE METABOLIC PANEL
ALT: 8 U/L (ref 0–44)
AST: 27 U/L (ref 15–41)
Albumin: 2.2 g/dL — ABNORMAL LOW (ref 3.5–5.0)
Alkaline Phosphatase: 91 U/L (ref 38–126)
Anion gap: 20 — ABNORMAL HIGH (ref 5–15)
BUN: 27 mg/dL — ABNORMAL HIGH (ref 8–23)
CO2: 9 mmol/L — ABNORMAL LOW (ref 22–32)
Calcium: 7.6 mg/dL — ABNORMAL LOW (ref 8.9–10.3)
Chloride: 103 mmol/L (ref 98–111)
Creatinine, Ser: 1.11 mg/dL — ABNORMAL HIGH (ref 0.44–1.00)
GFR calc Af Amer: 49 mL/min — ABNORMAL LOW (ref >60)
GFR calc non Af Amer: 42 mL/min — ABNORMAL LOW (ref >60)
Glucose, Bld: 334 mg/dL — ABNORMAL HIGH (ref 70–99)
POTASSIUM: 4.6 mmol/L (ref 3.5–5.1)
Sodium: 132 mmol/L — ABNORMAL LOW (ref 135–145)
Total Bilirubin: 0.5 mg/dL (ref 0.3–1.2)
Total Protein: 4.3 g/dL — ABNORMAL LOW (ref 6.5–8.1)

## 2018-04-04 LAB — PROTIME-INR
INR: 1.58
Prothrombin Time: 18.7 seconds — ABNORMAL HIGH (ref 11.4–15.2)

## 2018-04-04 LAB — PREPARE RBC (CROSSMATCH)

## 2018-04-04 LAB — GLUCOSE, CAPILLARY
GLUCOSE-CAPILLARY: 293 mg/dL — AB (ref 70–99)
Glucose-Capillary: 310 mg/dL — ABNORMAL HIGH (ref 70–99)

## 2018-04-04 LAB — CK: Total CK: 56 U/L (ref 38–234)

## 2018-04-04 LAB — PHOSPHORUS: Phosphorus: 6.8 mg/dL — ABNORMAL HIGH (ref 2.5–4.6)

## 2018-04-04 LAB — MRSA PCR SCREENING: MRSA by PCR: NEGATIVE

## 2018-04-04 MED ORDER — SODIUM CHLORIDE 0.9% IV SOLUTION
Freq: Once | INTRAVENOUS | Status: DC
  Filled 2018-04-04: qty 250

## 2018-04-04 MED ORDER — ORAL CARE MOUTH RINSE
15.0000 mL | OROMUCOSAL | Status: DC
  Administered 2018-04-04 – 2018-04-05 (×6): 15 mL via OROMUCOSAL

## 2018-04-04 MED ORDER — PROPOFOL 1000 MG/100ML IV EMUL
INTRAVENOUS | Status: AC
  Administered 2018-04-04: 5 ug/kg/min via INTRAVENOUS
  Filled 2018-04-04: qty 100

## 2018-04-04 MED ORDER — SODIUM CHLORIDE 0.9 % IV SOLN
8.0000 mg/h | INTRAVENOUS | Status: DC
  Administered 2018-04-04 – 2018-04-05 (×2): 8 mg/h via INTRAVENOUS
  Filled 2018-04-04 (×2): qty 80

## 2018-04-04 MED ORDER — CLINDAMYCIN PHOSPHATE 600 MG/50ML IV SOLN
600.0000 mg | Freq: Three times a day (TID) | INTRAVENOUS | Status: DC
  Administered 2018-04-05: 600 mg via INTRAVENOUS
  Filled 2018-04-04 (×3): qty 50

## 2018-04-04 MED ORDER — PROPOFOL 1000 MG/100ML IV EMUL
5.0000 ug/kg/min | INTRAVENOUS | Status: DC
  Administered 2018-04-04: 5 ug/kg/min via INTRAVENOUS
  Administered 2018-04-05: 45 ug/kg/min via INTRAVENOUS
  Filled 2018-04-04 (×2): qty 100

## 2018-04-04 MED ORDER — MIDAZOLAM HCL 2 MG/2ML IJ SOLN
2.0000 mg | Freq: Once | INTRAMUSCULAR | Status: AC
  Administered 2018-04-04: 2 mg via INTRAVENOUS
  Filled 2018-04-04: qty 2

## 2018-04-04 MED ORDER — SODIUM CHLORIDE 0.9% IV SOLUTION
Freq: Once | INTRAVENOUS | Status: AC
  Administered 2018-04-04: 22:00:00 via INTRAVENOUS

## 2018-04-04 MED ORDER — SODIUM CHLORIDE 0.9 % IV BOLUS
1000.0000 mL | Freq: Once | INTRAVENOUS | Status: AC
  Administered 2018-04-04: 1000 mL via INTRAVENOUS

## 2018-04-04 MED ORDER — SODIUM CHLORIDE 0.9 % IV SOLN
50.0000 ug/h | INTRAVENOUS | Status: DC
  Administered 2018-04-04 – 2018-04-05 (×2): 50 ug/h via INTRAVENOUS
  Filled 2018-04-04 (×5): qty 1

## 2018-04-04 MED ORDER — SODIUM CHLORIDE 0.9 % IV SOLN: 10.0000 mL/h | Freq: Once | INTRAVENOUS | Status: DC

## 2018-04-04 MED ORDER — CHLORHEXIDINE GLUCONATE 0.12% ORAL RINSE (MEDLINE KIT)
15.0000 mL | Freq: Two times a day (BID) | OROMUCOSAL | Status: DC
  Administered 2018-04-04 – 2018-04-05 (×2): 15 mL via OROMUCOSAL

## 2018-04-04 MED ORDER — MIDAZOLAM HCL 2 MG/2ML IJ SOLN
2.0000 mg | INTRAMUSCULAR | Status: DC | PRN
  Administered 2018-04-04: 2 mg via INTRAVENOUS
  Filled 2018-04-04: qty 2

## 2018-04-04 MED ORDER — FENTANYL CITRATE (PF) 100 MCG/2ML IJ SOLN
100.0000 ug | Freq: Once | INTRAMUSCULAR | Status: AC
  Administered 2018-04-04: 100 ug via INTRAVENOUS

## 2018-04-04 MED ORDER — MAGNESIUM SULFATE 2 GM/50ML IV SOLN
2.0000 g | Freq: Once | INTRAVENOUS | Status: AC
  Administered 2018-04-05: 2 g via INTRAVENOUS
  Filled 2018-04-04: qty 50

## 2018-04-04 MED ORDER — LACTATED RINGERS IV SOLN
INTRAVENOUS | Status: DC
  Administered 2018-04-04: 20:00:00 via INTRAVENOUS

## 2018-04-04 MED ORDER — SODIUM CHLORIDE 0.9 % IV SOLN
80.0000 mg | Freq: Once | INTRAVENOUS | Status: AC
  Administered 2018-04-04: 80 mg via INTRAVENOUS
  Filled 2018-04-04: qty 80

## 2018-04-04 MED ORDER — ETOMIDATE 2 MG/ML IV SOLN
15.0000 mg | Freq: Once | INTRAVENOUS | Status: AC
  Administered 2018-04-04: 15 mg via INTRAVENOUS

## 2018-04-04 MED ORDER — PROTHROMBIN COMPLEX CONC HUMAN 500 UNITS IV KIT
2112.0000 [IU] | PACK | Status: AC
  Administered 2018-04-04: 2112 [IU] via INTRAVENOUS
  Filled 2018-04-04: qty 2112

## 2018-04-04 MED ORDER — CLINDAMYCIN PHOSPHATE 600 MG/50ML IV SOLN
600.0000 mg | Freq: Once | INTRAVENOUS | Status: AC
  Administered 2018-04-04: 600 mg via INTRAVENOUS
  Filled 2018-04-04: qty 50

## 2018-04-04 MED ORDER — SUCCINYLCHOLINE CHLORIDE 20 MG/ML IJ SOLN
100.0000 mg | Freq: Once | INTRAMUSCULAR | Status: AC
  Administered 2018-04-04: 100 mg via INTRAVENOUS

## 2018-04-04 MED ORDER — LACTATED RINGERS IV BOLUS: 1000.0000 mL | Freq: Once | INTRAVENOUS | Status: DC

## 2018-04-04 MED ORDER — NOREPINEPHRINE BITARTRATE 1 MG/ML IV SOLN
0.0000 ug/min | INTRAVENOUS | Status: DC
  Administered 2018-04-04: 10 ug/min via INTRAVENOUS
  Administered 2018-04-04: 50 ug/min via INTRAVENOUS
  Administered 2018-04-04: 35 ug/min via INTRAVENOUS
  Administered 2018-04-04: 50 ug/min via INTRAVENOUS
  Administered 2018-04-04 – 2018-04-05 (×2): 35 ug/min via INTRAVENOUS
  Filled 2018-04-04 (×6): qty 4

## 2018-04-04 NOTE — ED Notes (Signed)
Emergency blood started.

## 2018-04-04 NOTE — H&P (Signed)
[place] [facility] [place] NAME: Sarah Dougherty    MR#:  [id number]  DATE OF BIRTH:  May 29, 1921  DATE OF ADMISSION:  03/24/2018  PRIMARY CARE PHYSICIAN: Perrin Maltese, MD   REQUESTING/REFERRING PHYSICIAN: Harvest Dark  CHIEF COMPLAINT:   Chief Complaint  Patient presents with  . GI Bleeding    HISTORY OF PRESENT ILLNESS:  Sarah Dougherty  is a 84 y.o. female with a known history listed below presented to emergency room via EMS for evaluation of upper GI bleed and unresponsiveness.  Patient is intubated and sedated.  Unable to provide any history.  All the history is discussed with ER physician and family member at bedside.  As per family member, patient was complaining of constipation yesterday.  Patient had prune juice with multiple episodes of bowel movement.  Family noticed blood in stool.  Today patient was feeling lightheaded.  Patient started having nausea with vomiting.  Bright red blood was noticed in vomit.  EMS was called.  Upon EMS arrival patient was not responding well.  Patient blood pressure was 60/40.  Patient brought to emergency room for further evaluation and treatment.  In emergency room patient was intubated and sedated.  Patient received fluid boluses and 2 units of PRBC.  Patient was put on Protonix and octreotide drip.  Patient also ordered Kcentra.  GI team notified.  CT had done that was negative for acute process.  Hospitalist team requested for ICU admission.  PAST MEDICAL HISTORY:   Past Medical History:  Diagnosis Date  . A-fib ([place])   . Arthritis    Osteoarthritis  . Asthma   . Cancer (HCC)    PAROTID GLAND  . CHF (congestive heart failure) ([place])   . Chronic kidney disease     / CKD STAGE 3  . COPD (chronic obstructive pulmonary disease) ([place])   . Coronary artery disease   . Diabetes mellitus without complication ([place])   . Dysrhythmia    CHRONIC AFIB  . GERD (gastroesophageal reflux disease)   .  Glaucoma (increased eye pressure)   . Hip fracture ([place]) 01/24/2018  . HOH (hard of hearing)    RIGHT HEARING AID  . Hypertension   . Hyperthyroidism   . Presence of permanent cardiac pacemaker   . Sleep apnea    No C-PAP/ NO LONGER WEARS OXYGEN    PAST SURGICAL HISTORY:   Past Surgical History:  Procedure Laterality Date  . ABDOMINAL HYSTERECTOMY    . BRAIN SURGERY    . CHOLECYSTECTOMY    . ENUCLEATION Right    EYE  . ESOPHAGOGASTRODUODENOSCOPY (EGD) WITH PROPOFOL N/A 01/08/2018   Procedure: ESOPHAGOGASTRODUODENOSCOPY (EGD) WITH PROPOFOL;  Surgeon: Jonathon Bellows, MD;  Location[facility] ENDOSCOPY;  Service: Gastroenterology;  Laterality: N/A;  . EYE SURGERY Left    Cataract Extraction with IOL  . EYE SURGERY Right    Artificial Eye  . FRACTURE SURGERY Left 01/27/2018   LEFT FEMUR  . HIP ARTHROPLASTY Right 04/07/2017   Procedure: ARTHROPLASTY BIPOLAR HIP (HEMIARTHROPLASTY);  Surgeon: Dereck Leep, MD;  Location: [facility] ORS;  Service: Orthopedics;  Laterality: Right;  . INSERT / REPLACE / REMOVE PACEMAKER    . INTRAMEDULLARY (IM) NAIL INTERTROCHANTERIC Left 01/27/2018   Procedure: INTRAMEDULLARY (IM) NAIL INTERTROCHANTRIC ( TFNA );  Surgeon: Dereck Leep, MD;  Location: [facility] ORS;  Service: Orthopedics;  Laterality: Left;  . JOINT REPLACEMENT Right 04/07/2017   HIP/HEMI ARTHROPLASTY  . PACEMAKER INSERTION Left 12/12/2014  Procedure: INSERTION PACEMAKER;  Surgeon: Isaias Cowman, MD;  Location: [facility] ORS;  Service: Cardiovascular;  Laterality: Left;  . PAROTIDECTOMY Left 12/10/2016   Procedure: PAROTIDECTOMY;  Surgeon: Clyde Canterbury, MD;  Location: [facility] ORS;  Service: ENT;  Laterality: Left;  . RADICAL NECK DISSECTION Left 12/10/2016   Procedure: RADICAL NECK DISSECTION;  Surgeon: Clyde Canterbury, MD;  Location: [facility] ORS;  Service: ENT;  Laterality: Left;  . TOTAL HIP REVISION Left 03/10/2018   Procedure: TOTAL HIP REVISION;  Surgeon: Dereck Leep, MD;  Location: [facility] ORS;   Service: Orthopedics;  Laterality: Left;    SOCIAL HISTORY:   Social History   Tobacco Use  . Smoking status: Never Smoker  . Smokeless tobacco: Never Used  Substance Use Topics  . Alcohol use: No    FAMILY HISTORY:   Family History  Problem Relation Age of Onset  . Diabetes Brother   . Diabetes Son   . Stroke Mother   . Cancer Father   . Stroke Sister   . Stroke Sister   . COPD Brother   . Diabetes Son     DRUG ALLERGIES:   Allergies  Allergen Reactions  . Amoxicillin Other (See Comments)    unknown  . Cephalexin Other (See Comments)    unknown  . Nitrofurantoin Other (See Comments)     Unknown  . Penicillins Other (See Comments)    Other reaction(s): Unknown  . Sulfa Antibiotics Hives and Itching  . Atorvastatin     Other reaction(s): Muscle Pain  . Azithromycin Rash    REVIEW OF SYSTEMS:   ROS -unable to obtain secondary to intubation and sedation  MEDICATIONS AT HOME:   Prior to Admission medications   Medication Sig Start Date End Date Taking? Authorizing Provider  acetaminophen (TYLENOL 8 HOUR) 650 MG CR tablet Take 650 mg by mouth every 8 (eight) hours as needed for pain.   Yes [provider]  albuterol (PROAIR HFA) 108 (90 Base) MCG/ACT inhaler Inhale 1 puff into the lungs every 6 (six) hours as needed for wheezing or shortness of breath. 02/09/18  Yes Gerlene Fee, NP  amLODipine (NORVASC) 5 MG tablet Take 1 tablet by mouth daily. 02/20/18  Yes [provider]  apixaban (ELIQUIS) 2.5 MG TABS tablet Take 1 tablet (2.5 mg total) by mouth 2 (two) times daily. 02/09/18  Yes Gerlene Fee, NP  brimonidine (ALPHAGAN) 0.2 % ophthalmic solution Place 1 drop into the left eye 2 (two) times daily. 02/09/18  Yes Gerlene Fee, NP  Cholecalciferol (VITAMIN D) 2000 units tablet Take 2,000 Units by mouth daily.   Yes [provider]  CRANBERRY PO Take 1 capsule by mouth daily.   Yes [provider]  glimepiride  (AMARYL) 2 MG tablet Take 1 tablet (2 mg total) by mouth daily. 02/09/18  Yes Gerlene Fee, NP  [facility]) CREA Apply liberal amount to area of skin irritation every shift.  Okay to leave at bedside 01/30/18  Yes [provider]  LORazepam (ATIVAN) 0.5 MG tablet Take 0.5 mg by mouth daily as needed for anxiety.  03/09/18  Yes [provider]  nystatin cream (MYCOSTATIN) Apply 1 application topically 2 (two) times daily. For vaginal itching   Yes [provider]  sodium chloride (OCEAN) 0.65 % nasal spray Place 2 sprays into the nose daily as needed for congestion.  09/14/14  Yes [provider]  amiodarone (PACERONE) 100 MG tablet Take 1 tablet (100 mg  total) by mouth daily. Patient not taking: Reported on 04/13/2018 02/09/18   Gerlene Fee, NP  travoprost, benzalkonium, (TRAVATAN) 0.004 % ophthalmic solution Place 1 drop into the left eye at bedtime. Patient not taking: Reported on 03/19/2018 02/09/18   Gerlene Fee, NP      VITAL SIGNS:  Blood pressure (!) 108/54, pulse 66, temperature (!) 94.9 F (34.9 C), resp. rate 18, weight 45.8 kg, SpO2 100 %.  PHYSICAL EXAMINATION:  Physical Exam  GENERAL: Intubated and sedated EYES: Right eye with chronic corneal clouding.  Left eye with pupillary reflex on light. HEENT: Head atraumatic, normocephalic.  NECK:  Supple, no jugular venous distention. No thyroid enlargement, no tenderness.  LUNGS: Bilateral rhonchi.  CARDIOVASCULAR: S1, S2 normal. No murmurs, rubs, or gallops.  ABDOMEN: Soft, nontender, nondistended. Bowel sounds present. No organomegaly or mass.  EXTREMITIES: No pedal edema, cyanosis, or clubbing.  NEUROLOGIC: Unable to obtain secondary to intubation and sedation PSYCHIATRIC: Unable to obtain secondary to intubation and sedation SKIN: No obvious rash, lesion, or ulcer.   LABORATORY PANEL:   CBC Recent Labs  Lab 03/15/2018 1430  WBC 21.4*  HGB 4.6*  HCT  17.4*  PLT 281   ------------------------------------------------------------------------------------------------------------------  Chemistries  Recent Labs  Lab 03/15/2018 1430  NA 132*  K 4.6  CL 103  CO2 9*  GLUCOSE 334*  BUN 27*  CREATININE 1.11*  CALCIUM 7.6*  AST 27  ALT 8  ALKPHOS 91  BILITOT 0.5   ------------------------------------------------------------------------------------------------------------------  Cardiac Enzymes No results for input(s): TROPONINI in the last 168 hours. ------------------------------------------------------------------------------------------------------------------  RADIOLOGY:  Ct Head Wo Contrast  Result Date: 04/08/2018 CLINICAL DATA:  Loss of consciousness in minimally responsive. EXAM: CT HEAD WITHOUT CONTRAST TECHNIQUE: Contiguous axial images were obtained from the base of the skull through the vertex without intravenous contrast. COMPARISON:  01/24/2018. FINDINGS: Brain: There is no evidence for acute hemorrhage, hydrocephalus, mass lesion, or abnormal extra-axial fluid collection. No definite CT evidence for acute infarction. Patchy low attenuation in the deep hemispheric and periventricular white matter is nonspecific, but likely reflects chronic microvascular ischemic demyelination. 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 Vascular: No hyperdense vessel or unexpected calcification. Skull: Apparent burr holes in the left skull. Otherwise unremarkable. Sinuses/Orbits: Chronic mucosal thickening in the ethmoid sinuses. Paranasal sinuses otherwise unremarkable. Right globe prosthesis. Left orbit unremarkable. Other: None. IMPRESSION: 1. Stable.  No acute intracranial abnormality. 2. 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 Electronically Signed   By: Misty Stanley M.D.   On: 03/14/2018 16:57   Dg Chest Portable 1 View  Result Date: 04/03/2018 CLINICAL DATA:  Status post CPR. Check  tube placement. EXAM: PORTABLE CHEST 1 VIEW COMPARISON:  03/14/2018. FINDINGS: 1515 hours. Endotracheal tube tip is 1.1 cm above the base of the carina. NG tube tip is positioned in the mid stomach. Cardiopericardial silhouette is at upper limits of normal for size. Interstitial markings are diffusely coarsened with chronic features. The lungs are clear without focal pneumonia, edema, pneumothorax or pleural effusion. Bones are diffusely demineralized. Telemetry leads overlie the chest. Left permanent pacemaker noted. IMPRESSION: Support apparatus as above.  No acute cardiopulmonary findings. Electronically Signed   By: Misty Stanley M.D.   On: 04/04/2018 15:48      IMPRESSION AND PLAN:   1.  Acute GI bleed and acute blood loss anemia: GI team has been notified.  Patient has received 4 units of PRBC and 1 unit FFP.  Recheck CBC now and  then every 4 hour.  Admit patient to ICU.  Continue Protonix and octreotide drip.  Follow-up GI recommendation.  IV Zosyn.  Transfuse PRBC as indicated.  Continue IV hydration.  On levo fed for low blood pressure.  Titrate as indicated.  2.  Altered mental status/unresponsiveness: Status post intubation and sedation to protect airway.  CT head negative for acute process.  3.  Probable aspiration pneumonia: Started on IV Zosyn.    4.  Acute hypovolemic shock: Continue hydration.  PRN blood transfusion.  Continue Levophed.  Adjust medication as indicated.  5.  Chronic other medical problems: Monitor.  Hold home medications  Estimated length of stay more than 2 midnight  High risk secondary to above  Case discussed with family.  Patient is DNR now.  Overall poor prognosis.   I have personally discussed case with ICU attending Dr. Mortimer Fries.  All the records are reviewed and case discussed with ED provider. Management plans discussed with the patient, family and they are in agreement.  CODE STATUS: DNR  TOTAL TIME TAKING CARE OF THIS PATIENT: 55 minutes.     Sedalia Muta M.D on 04/07/2018 at 6:09 PM  Between 7am to 6pm - Pager - [phone]  After 6pm go to [email or web address] - Proofreader  [facility]  Office  [phone]  CC: Primary care physician; Perrin Maltese, MD

## 2018-04-04 NOTE — ED Provider Notes (Signed)
[facility] Emergency Department Provider Note  Time seen: 3:00 PM  I have reviewed the triage vital signs and the nursing notes.   HISTORY  Chief Complaint GI Bleeding    HPI Sarah Dougherty is a 84 y.o. female with a past medical history of atrial fibrillation on apixaban, CHF, CKD, CAD, gastric reflux, hypertension presents to the emergency department via emergency traffic for nonresponsive and upper GI bleed.  According to EMS report patient lives at home is very independent at baseline, son lives with the patient.  Son found the patient in the bedroom covered in very bright red blood vomit and found her to be minimally responsive, EMS was called.  EMS states patient remained minimally responsive throughout transport blood pressure 60/40.  Started IV fluids for the patient.  They report son noted dark stool over the past 2 weeks as well.  Per EMS report patient is a full code at this time.   Past Medical History:  Diagnosis Date  . A-fib ([place])   . Arthritis    Osteoarthritis  . Asthma   . Cancer (HCC)    PAROTID GLAND  . CHF (congestive heart failure) ([place])   . Chronic kidney disease     / CKD STAGE 3  . COPD (chronic obstructive pulmonary disease) ([place])   . Coronary artery disease   . Diabetes mellitus without complication ([place])   . Dysrhythmia    CHRONIC AFIB  . GERD (gastroesophageal reflux disease)   . Glaucoma (increased eye pressure)   . Hip fracture ([place]) 01/24/2018  . HOH (hard of hearing)    RIGHT HEARING AID  . Hypertension   . Hyperthyroidism   . Presence of permanent cardiac pacemaker   . Sleep apnea    No C-PAP/ NO LONGER WEARS OXYGEN    Patient Active Problem List   Diagnosis Date Noted  . Acute respiratory failure with hypoxia ([place]) 03/14/2018  . Hip fracture ([place]) 01/24/2018  . Chronic kidney disease, stage 3 ([place]) 01/13/2018  . GERD (gastroesophageal reflux disease) 01/13/2018  . Hyperlipidemia 01/13/2018  . Hypertension  01/13/2018  . Osteoarthritis of knees, bilateral 01/13/2018  . 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 Duodenal mass   . Abdominal pain   . Pancreatitis 01/05/2018  . S/P hip hemiarthroplasty 08/16/2017  . Age-related osteoporosis with current pathological fracture with routine healing 04/10/2017  . Chronic atrial fibrillation 04/10/2017  . Hip fx ([place]) 04/03/2017  . 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 COPD exacerbation ([place]) 04/07/2016  . Squamous cell carcinoma 03/19/2016  . Bradycardia 12/10/2014  . COPD (chronic obstructive pulmonary disease) ([place]) 12/09/2014  . Asthma 02/17/2014  . Congestive heart failure ([place]) 02/17/2014  . Chronic respiratory failure with hypoxia ([place]) 11/09/2013  . Benign essential hypertension 12/07/2006  . Type 2 diabetes mellitus ([place]) 12/07/2006    Past Surgical History:  Procedure Laterality Date  . ABDOMINAL HYSTERECTOMY    . BRAIN SURGERY    . CHOLECYSTECTOMY    . ENUCLEATION Right    EYE  . ESOPHAGOGASTRODUODENOSCOPY (EGD) WITH PROPOFOL N/A 01/08/2018   Procedure: ESOPHAGOGASTRODUODENOSCOPY (EGD) WITH PROPOFOL;  Surgeon: Jonathon Bellows, MD;  Location[facility] ENDOSCOPY;  Service: Gastroenterology;  Laterality: N/A;  . EYE SURGERY Left    Cataract Extraction with IOL  . EYE SURGERY Right    Artificial Eye  . FRACTURE SURGERY Left 01/27/2018   LEFT FEMUR  . HIP ARTHROPLASTY Right 04/07/2017   Procedure: ARTHROPLASTY BIPOLAR HIP (HEMIARTHROPLASTY);  Surgeon: Skip Estimable  P, MD;  Location: [facility] ORS;  Service: Orthopedics;  Laterality: Right;  . INSERT / REPLACE / REMOVE PACEMAKER    . INTRAMEDULLARY (IM) NAIL INTERTROCHANTERIC Left 01/27/2018   Procedure: INTRAMEDULLARY (IM) NAIL INTERTROCHANTRIC ( TFNA );  Surgeon: Dereck Leep, MD;  Location: [facility] ORS;  Service: Orthopedics;  Laterality: Left;  . JOINT REPLACEMENT Right 04/07/2017   HIP/HEMI ARTHROPLASTY  . PACEMAKER INSERTION Left 12/12/2014   Procedure: INSERTION  PACEMAKER;  Surgeon: Isaias Cowman, MD;  Location: [facility] ORS;  Service: Cardiovascular;  Laterality: Left;  . PAROTIDECTOMY Left 12/10/2016   Procedure: PAROTIDECTOMY;  Surgeon: Clyde Canterbury, MD;  Location: [facility] ORS;  Service: ENT;  Laterality: Left;  . RADICAL NECK DISSECTION Left 12/10/2016   Procedure: RADICAL NECK DISSECTION;  Surgeon: Clyde Canterbury, MD;  Location: [facility] ORS;  Service: ENT;  Laterality: Left;  . TOTAL HIP REVISION Left 03/10/2018   Procedure: TOTAL HIP REVISION;  Surgeon: Dereck Leep, MD;  Location: [facility] ORS;  Service: Orthopedics;  Laterality: Left;    Prior to Admission medications   Medication Sig Start Date End Date Taking? Authorizing Provider  acetaminophen (TYLENOL 8 HOUR) 650 MG CR tablet Take 650 mg by mouth every 8 (eight) hours as needed for pain.    [provider]  albuterol (PROAIR HFA) 108 (90 Base) MCG/ACT inhaler Inhale 1 puff into the lungs every 6 (six) hours as needed for wheezing or shortness of breath. 02/09/18   Gerlene Fee, NP  amiodarone (PACERONE) 100 MG tablet Take 1 tablet (100 mg total) by mouth daily. 02/09/18   Gerlene Fee, NP  amLODipine (NORVASC) 5 MG tablet Take 1 tablet by mouth daily. 02/20/18   [provider]  apixaban (ELIQUIS) 2.5 MG TABS tablet Take 1 tablet (2.5 mg total) by mouth 2 (two) times daily. Patient taking differently: Take 2.5 mg by mouth 2 (two) times daily. Pt gets 5 mg tablets as samples from Dr. Humphrey Rolls and the office halves them for the family to make the 2.5 mg dose. 02/09/18   Gerlene Fee, NP  brimonidine (ALPHAGAN) 0.2 % ophthalmic solution Place 1 drop into the left eye 2 (two) times daily. 02/09/18   Gerlene Fee, NP  Cholecalciferol (VITAMIN D) 2000 units tablet Take 2,000 Units by mouth daily.    [provider]  CRANBERRY PO Take 1 capsule by mouth daily.    [provider]  glimepiride (AMARYL) 2 MG tablet Take 1 tablet (2 mg total) by mouth daily.  02/09/18   Gerlene Fee, NP  Infant Care Products [facility]) CREA Apply liberal amount to area of skin irritation every shift.  Okay to leave at bedside 01/30/18   [provider]  nystatin cream (MYCOSTATIN) Apply 1 application topically 2 (two) times daily. For vaginal itching    [provider]  sodium chloride (OCEAN) 0.65 % nasal spray Place 2 sprays into the nose daily as needed. 09/14/14   [provider]  travoprost, benzalkonium, (TRAVATAN) 0.004 % ophthalmic solution Place 1 drop into the left eye at bedtime. 02/09/18   Gerlene Fee, NP    Allergies  Allergen Reactions  . Amoxicillin Other (See Comments)    unknown  . Cephalexin Other (See Comments)    unknown  . Nitrofurantoin Other (See Comments)     Unknown  . Penicillins Other (See Comments)    Other reaction(s): Unknown  . Sulfa Antibiotics Hives and Itching  . Atorvastatin     Other reaction(s):  Muscle Pain  . Azithromycin Rash    Family History  Problem Relation Age of Onset  . Diabetes Brother   . Diabetes Son   . Stroke Mother   . Cancer Father   . Stroke Sister   . Stroke Sister   . COPD Brother   . Diabetes Son     Social History Social History   Tobacco Use  . Smoking status: Never Smoker  . Smokeless tobacco: Never Used  Substance Use Topics  . Alcohol use: No  . Drug use: No    Review of Systems Unable to obtain a review of systems due to nonresponsive status.  ____________________________________________   PHYSICAL EXAM:  VITAL SIGNS: ED Triage Vitals [03/30/2018 1430]  Enc Vitals Group     BP (!) 80/38     Pulse Rate 66     Resp (!) 26     Temp      Temp src      SpO2 (!) 81 %     Weight      Height      Head Circumference      Peak Flow      Pain Score      Pain Loc      Pain Edu?      Excl. in [place]?    Constitutional: Patient is minimally responsive will occasionally moan, but is not following commands or answering questions. Eyes:  Clouding of right cornea. ENT   Head: Normocephalic and atraumatic.   Mouth/Throat: Blood within the oral cavity Cardiovascular: Normal rate, regular rhythm Respiratory: Mild tachypnea, rhonchi throughout. Gastrointestinal: Soft, atraumatic.  No distention. Musculoskeletal: Atraumatic appearing extremities. Neurologic: Patient is not responsive to painful stimuli. Skin:  Skin is warm Psychiatric: Mood and affect are normal.   ____________________________________________    EKG  EKG viewed and interpreted by myself shows an atrial paced rhythm at 60 bpm with a slightly widened QRS, normal axis, largely normal intervals with nonspecific ST changes.  ____________________________________________    RADIOLOGY  ETT well placed. no acute findings  ____________________________________________   INITIAL IMPRESSION / ASSESSMENT AND PLAN / ED COURSE  Pertinent labs & imaging results that were available during my care of the patient were reviewed by me and considered in my medical decision making (see chart for details).  Patient presents to the emergency department unresponsive with what appears to be an upper GI bleed.  Upon arrival patient is quite hypotensive initial blood pressure of 80/60.  Emergency release blood products ordered 2 units of O+ blood.  Patient has rhonchi throughout initial pulse ox in the 70s on room air.  Started on O2 supplementation.  Per EMS report patient is a full code at this time I decided to intubate the patient for airway protection.  Intubation performed without issue.  After intubation patient noted to have blood within the ET tube likely due to aspiration of bloody vomitus.  Patient's blood pressure is improving with fluids.  Son is now available at bedside.  I discussed with him he is agreeable to plan of care.  Patient takes apixaban for chronic A. fib.  We will order Kcentra we will start Protonix and octreotide drips.  Given likely aspiration  patient will be started on IV antibiotics patient will require admission to the intensive care unit.  I discussed the patient with Dr. [name] Norris of GI medicine.  States the patient needs to be admitted and medically stabilized, but is aware of the patient.  Labs are resulted  with a hemoglobin of 4.6.  I ordered 2 additional units of typed crossed blood.  INTUBATION Performed by: Harvest Dark  Required items: required blood products, implants, devices, and special equipment available Patient identity confirmed: provided demographic data and hospital-assigned identification number Time out: Immediately prior to procedure a "time out" was called to verify the correct patient, procedure, equipment, support staff and site/side marked as required.  Indications: Airway protection  Intubation method: 4.0 Glidescope Laryngoscopy   Preoxygenation: 100% BVM  Sedatives: 15 mg etomidate Paralytic: 100 mg succinylcholine  Tube Size: 7.0 cuffed  Post-procedure assessment: chest rise and ETCO2 monitor Breath sounds: equal and absent over the epigastrium Tube secured with: ETT holder Chest x-ray interpreted by radiologist and me.  Chest x-ray findings: endotracheal tube in appropriate position  Patient tolerated the procedure well with no immediate complications.  CRITICAL CARE Performed by: Harvest Dark   Total critical care time: 60 minutes  Critical care time was exclusive of separately billable procedures and treating other patients.  Critical care was necessary to treat or prevent imminent or life-threatening deterioration.  Critical care was time spent personally by me on the following activities: development of treatment plan with patient and/or surrogate as well as nursing, discussions with consultants, evaluation of patient's response to treatment, examination of patient, obtaining history from patient or surrogate, ordering and performing treatments and interventions, ordering  and review of laboratory studies, ordering and review of radiographic studies, pulse oximetry and re-evaluation of patient's condition.  ____________________________________________   FINAL CLINICAL IMPRESSION(S) / ED DIAGNOSES  GI bleed Hypotension Aspiration    Harvest Dark, MD 04/07/2018 [phone]

## 2018-04-04 NOTE — ED Triage Notes (Signed)
Pt presents via EMS from home with c/o bright red blood emesis per EMS. AMS noted per pt baseline per family, Presents with hypotension, unable to give hx. MD at bedside upon arrival.

## 2018-04-04 NOTE — ED Notes (Addendum)
Bair hugger applied to pt at this time on high.

## 2018-04-04 NOTE — ED Notes (Signed)
Family at bedside. 

## 2018-04-04 NOTE — ED Notes (Signed)
Orders for blood, FFP, and levophed given per admitting.

## 2018-04-04 NOTE — ED Notes (Signed)
Dr. Patel at bedside 

## 2018-04-04 NOTE — ED Provider Notes (Signed)
I have briefly assisted Dr. Kerman Passey in the care of this patient as she appeared quite critically ill with agonal respirations and immediate concerns for acute massive GI bleed based on the initial information from EMS with severe hypotension.  The patient was successfully intubated by Dr. Kerman Passey e, as resuscitation proceeded.  I did order 2 units of emergency release blood, i-STAT indicated hematocrit less than 15 with evidence of obvious blood in her oropharynx prior to intubation attempt as well.  Blood product was ordered on an emergent basis, the patient was critical need immediate resuscitation.  EMS reports that they did not have a DO NOT RESUSCITATE order on the scene, until further information can be obtained Dr. Kerman Passey and initially assuming full code and this life-threatening situation  Ongoing care under the care of the ED team, Dr. Kerman Passey.     Delman Kitten, MD 04/06/2018 1444

## 2018-04-04 NOTE — Consult Note (Signed)
PULMONARY / CRITICAL CARE MEDICINE  Name: Sarah Dougherty MRN: [id number] DOB: 1921-10-31    LOS: 0  Referring Provider: Dr. Sedalia Muta Reason for Referral: Acute respiratory failure and acute GI bleed Brief patient description: 84 year old female admitted with acute GI bleed and acute respiratory failure secondary to aspiration.  Intubated and sedated  HPI: This is a 84 year old Caucasian female with a medical history as indicated below presented to the ED with bloody emesis and hypotension.  She was on Eliquis at home for atrial fibrillation.  At the ED, her hemoglobin was 4.6 with a hematocrit of 17.4 WBC of 21,000, a platelet count of 281, sodium of 132, glucose of 334 CO2 of 9, creatinine of 1.1 and an anion gap of 20.  She was intubated for airway protection.  She was given 4 units of packed red blood cells and 1 unit of FFP.  She was also started on Protonix, Kcentra and octreotide.  Per hospitalist note, GI has been notified.  She is being admitted to the ICU for further management.  She had an upper endoscopy in September 2019 that was normal except for a large hiatal hernia.  Past Medical History:  Diagnosis Date  . A-fib ([place])   . Arthritis    Osteoarthritis  . Asthma   . Cancer (HCC)    PAROTID GLAND  . CHF (congestive heart failure) ([place])   . Chronic kidney disease     / CKD STAGE 3  . COPD (chronic obstructive pulmonary disease) ([place])   . Coronary artery disease   . Diabetes mellitus without complication ([place])   . Dysrhythmia    CHRONIC AFIB  . GERD (gastroesophageal reflux disease)   . Glaucoma (increased eye pressure)   . Hip fracture ([place]) 01/24/2018  . HOH (hard of hearing)    RIGHT HEARING AID  . Hypertension   . Hyperthyroidism   . Presence of permanent cardiac pacemaker   . Sleep apnea    No C-PAP/ NO LONGER WEARS OXYGEN   Past Surgical History:  Procedure Laterality Date  . ABDOMINAL HYSTERECTOMY    . BRAIN SURGERY    . CHOLECYSTECTOMY    .  ENUCLEATION Right    EYE  . ESOPHAGOGASTRODUODENOSCOPY (EGD) WITH PROPOFOL N/A 01/08/2018   Procedure: ESOPHAGOGASTRODUODENOSCOPY (EGD) WITH PROPOFOL;  Surgeon: Jonathon Bellows, MD;  Location: [facility] ENDOSCOPY;  Service: Gastroenterology;  Laterality: N/A;  . EYE SURGERY Left    Cataract Extraction with IOL  . EYE SURGERY Right    Artificial Eye  . FRACTURE SURGERY Left 01/27/2018   LEFT FEMUR  . HIP ARTHROPLASTY Right 04/07/2017   Procedure: ARTHROPLASTY BIPOLAR HIP (HEMIARTHROPLASTY);  Surgeon: Dereck Leep, MD;  Location: [facility] ORS;  Service: Orthopedics;  Laterality: Right;  . INSERT / REPLACE / REMOVE PACEMAKER    . INTRAMEDULLARY (IM) NAIL INTERTROCHANTERIC Left 01/27/2018   Procedure: INTRAMEDULLARY (IM) NAIL INTERTROCHANTRIC ( TFNA );  Surgeon: Dereck Leep, MD;  Location: [facility] ORS;  Service: Orthopedics;  Laterality: Left;  . JOINT REPLACEMENT Right 04/07/2017   HIP/HEMI ARTHROPLASTY  . PACEMAKER INSERTION Left 12/12/2014   Procedure: INSERTION PACEMAKER;  Surgeon: Isaias Cowman, MD;  Location: [facility] ORS;  Service: Cardiovascular;  Laterality: Left;  . PAROTIDECTOMY Left 12/10/2016   Procedure: PAROTIDECTOMY;  Surgeon: Clyde Canterbury, MD;  Location: [facility] ORS;  Service: ENT;  Laterality: Left;  . RADICAL NECK DISSECTION Left 12/10/2016   Procedure: RADICAL NECK DISSECTION;  Surgeon: Clyde Canterbury, MD;  Location: [facility] ORS;  Service: ENT;  Laterality: Left;  . TOTAL HIP REVISION Left 03/10/2018   Procedure: TOTAL HIP REVISION;  Surgeon: Dereck Leep, MD;  Location: [facility] ORS;  Service: Orthopedics;  Laterality: Left;   Prior to Admission medications   Medication Sig Start Date End Date Taking? Authorizing Provider  amLODipine (NORVASC) 5 MG tablet Take 5 mg by mouth daily.   Yes [provider]  clopidogrel (PLAVIX) 75 MG tablet Take 75 mg by mouth daily.   Yes [provider]  donepezil (ARICEPT) 5 MG tablet Take 1 tablet (5 mg total) by mouth at bedtime.  12/07/17 01/16/18 Yes Sowles, Drue Stager, MD  empagliflozin (JARDIANCE) 25 MG TABS tablet Take 25 mg by mouth daily.   Yes [provider]  glycopyrrolate (ROBINUL) 1 MG tablet Take 1 mg by mouth 2 (two) times daily.   Yes [provider]  insulin aspart (NOVOLOG FLEXPEN) 100 UNIT/ML FlexPen Inject 12 Units into the skin 2 (two) times daily.   Yes [provider]  insulin aspart (NOVOLOG) 100 UNIT/ML FlexPen Inject 18 Units into the skin daily. At 1700   Yes [provider]  Insulin Degludec-Liraglutide (XULTOPHY) 100-3.6 UNIT-MG/ML SOPN Inject 50 Units into the skin daily.   Yes [provider]  levETIRAcetam (KEPPRA) 500 MG tablet Take 500 mg by mouth 2 (two) times daily.   Yes [provider]  lipase/protease/amylase (CREON) 12000 units CPEP capsule Take 6,000 Units by mouth 3 (three) times daily before meals.   Yes [provider]  lipase/protease/amylase (CREON) 12000 units CPEP capsule Take 3,000 Units by mouth at bedtime. With snack   Yes [provider]  lisinopril (PRINIVIL,ZESTRIL) 5 MG tablet Take 5 mg by mouth daily.   Yes [provider]  metoprolol succinate (TOPROL-XL) 25 MG 24 hr tablet Take 1 tablet (25 mg total) by mouth daily. 12/07/17  Yes Sowles, Drue Stager, MD  rosuvastatin (CRESTOR) 40 MG tablet Take 1 tablet (40 mg total) by mouth daily. 12/07/17 01/16/18 Yes Steele Sizer, MD  aspirin EC 81 MG tablet Take 81 mg by mouth daily.    [provider]  famotidine (PEPCID) 20 MG tablet Take 1 tablet (20 mg total) by mouth 2 (two) times daily. 12/07/17 01/06/18  Steele Sizer, MD  gabapentin (NEURONTIN) 300 MG capsule Take 1 capsule (300 mg total) by mouth 2 (two) times daily. 12/07/17 01/06/18  Steele Sizer, MD  insulin glargine (LANTUS) 100 UNIT/ML injection Inject 0.1 mLs (10 Units total) into the skin daily. 12/07/17 01/06/18  Steele Sizer, MD  lacosamide 100 MG TABS Take 1 tablet (100 mg total) by  mouth 2 (two) times daily. Patient not taking: Reported on 01/16/2018 04/24/17   Fritzi Mandes, MD  promethazine (PHENERGAN) 12.5 MG tablet Take 1 tablet (12.5 mg total) by mouth every 6 (six) hours as needed for nausea or vomiting. Patient not taking: Reported on 01/16/2018 02/10/17   Stark Klein, MD  sertraline (ZOLOFT) 25 MG tablet Take 1 tablet (25 mg total) by mouth daily. Patient not taking: Reported on 01/16/2018 12/07/17   Steele Sizer, MD   Allergies Allergies  Allergen Reactions  . Amoxicillin Other (See Comments)    unknown  . Cephalexin Other (See Comments)    unknown  . Nitrofurantoin Other (See Comments)     Unknown  . Penicillins Other (See Comments)    Other reaction(s): Unknown  . Sulfa Antibiotics Hives and Itching  . Atorvastatin     Other reaction(s): Muscle Pain  . Azithromycin Rash  Family History Family History  Problem Relation Age of Onset  . Diabetes Brother   . Diabetes Son   . Stroke Mother   . Cancer Father   . Stroke Sister   . Stroke Sister   . COPD Brother   . Diabetes Son    Social History  reports that she has never smoked. She has never used smokeless tobacco. She reports that she does not drink alcohol or use drugs.  Review Of Systems: Unable to obtain as patient is intubated and sedated  VITAL SIGNS: BP (!) 74/52   Pulse (!) 106   Temp 97.7 F (36.5 C)   Resp (!) 26   Ht 5\' 2"  (1.575 m)   Wt 52.5 kg   SpO2 99%   BMI 21.17 kg/m   HEMODYNAMICS:    VENTILATOR SETTINGS: Vent Mode: PRVC FiO2 (%):  [50 %-100 %] 50 % Set Rate:  [16 bmp] 16 bmp Vt Set:  [450 mL] 450 mL PEEP:  [5 cmH20] 5 cmH20  INTAKE / OUTPUT: I/O last 3 completed shifts: In: 76.8 [IV Piggyback:76.8] Out: -   PHYSICAL EXAMINATION: General: Acutely ill looking HEENT: Normocephalic and atraumatic, PERRLA, sclera anicteric, trachea midline, no JVD, orogastric tube with bloody drainage Neuro: Withdraws to painful stimulus, sedated, gag reflex  intact Cardiovascular: Apical pulse irregular, tachycardic, S1-S2, no murmur regurg or gallop, +2 pulses bilaterally, no edema Lungs: Bilateral breath sounds, diminished in all lung fields, positive Rales in the right lower lobe, no wheezing Abdomen: Nondistended, positive bowel sounds in all 4 quadrants, palpation reveals no organomegaly Musculoskeletal: Age-related changes seen bilateral knees, no joint swelling Skin: Warm and dry, multiple small bruises in upper and lower extremities  LABS:  BMET Recent Labs  Lab 03/16/2018 1430  NA 132*  K 4.6  CL 103  CO2 9*  BUN 27*  CREATININE 1.11*  GLUCOSE 334*    Electrolytes Recent Labs  Lab 04/09/2018 1430  CALCIUM 7.6*    CBC Recent Labs  Lab 03/23/2018 1430 03/25/2018 1915  WBC 21.4* 25.2*  HGB 4.6* 10.5*  HCT 17.4* 33.4*  PLT 281 140*    Coag's No results for input(s): APTT, INR in the last 168 hours.  Sepsis Markers No results for input(s): LATICACIDVEN, PROCALCITON, O2SATVEN in the last 168 hours.  ABG No results for input(s): PHART, PCO2ART, PO2ART in the last 168 hours.  Liver Enzymes Recent Labs  Lab 03/27/2018 1430  AST 27  ALT 8  ALKPHOS 91  BILITOT 0.5  ALBUMIN 2.2*    Cardiac Enzymes No results for input(s): TROPONINI, PROBNP in the last 168 hours.  Glucose Recent Labs  Lab 04/10/2018 1432 03/22/2018 1855  GLUCAP 310* 293*    Imaging Ct Head Wo Contrast  Result Date: 03/28/2018 CLINICAL DATA:  Loss of consciousness in minimally responsive. EXAM: CT HEAD WITHOUT CONTRAST TECHNIQUE: Contiguous axial images were obtained from the base of the skull through the vertex without intravenous contrast. COMPARISON:  01/24/2018. FINDINGS: Brain: There is no evidence for acute hemorrhage, hydrocephalus, mass lesion, or abnormal extra-axial fluid collection. No definite CT evidence for acute infarction. Patchy low attenuation in the deep hemispheric and periventricular white matter is nonspecific, but likely  reflects chronic microvascular ischemic demyelination. 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 Vascular: No hyperdense vessel or unexpected calcification. Skull: Apparent burr holes in the left skull. Otherwise unremarkable. Sinuses/Orbits: Chronic mucosal thickening in the ethmoid sinuses. Paranasal sinuses otherwise unremarkable. Right globe prosthesis. Left orbit unremarkable. Other: None. IMPRESSION: 1. Stable.  No  acute intracranial abnormality. 2. 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 Electronically Signed   By: Misty Stanley M.D.   On: 03/16/2018 16:57   Dg Chest Portable 1 View  Result Date: 03/27/2018 CLINICAL DATA:  Status post CPR. Check tube placement. EXAM: PORTABLE CHEST 1 VIEW COMPARISON:  03/14/2018. FINDINGS: 1515 hours. Endotracheal tube tip is 1.1 cm above the base of the carina. NG tube tip is positioned in the mid stomach. Cardiopericardial silhouette is at upper limits of normal for size. Interstitial markings are diffusely coarsened with chronic features. The lungs are clear without focal pneumonia, edema, pneumothorax or pleural effusion. Bones are diffusely demineralized. Telemetry leads overlie the chest. Left permanent pacemaker noted. IMPRESSION: Support apparatus as above.  No acute cardiopulmonary findings. Electronically Signed   By: Misty Stanley M.D.   On: 03/18/2018 15:48   STUDIES:  Last echo was in 12/10/2014 and showed an EF of 60 to 65%  CULTURES: None  ANTIBIOTICS: Vancomycin Cefepime Flagyl  SIGNIFICANT EVENTS: 04/03/2018: Admitted  LINES/TUBES: Foley catheter ET tube NG tube Right femoral triple-lumen catheter  DISCUSSION: 84 year old female presenting with acute upper and lower GI bleed, acute respiratory failure secondary to GI bleed, due to encephalopathy, hypovolemic and septic shock, sepsis secondary to right middle and lower lobe pneumonia and aspiration pneumonia.  ASSESSMENT /  PLAN:  PULMONARY A: Acute respiratory failure with hypoxemia Aspiration pneumonitis Severe lactic acidosis P:   Full vent support with current settings Weaning trials as tolerated Bicarb infusion ABG and chest x-ray as needed Nebulized bronchodilators Antibiotics as above  CARDIOVASCULAR A:  Septic and hypovolemic shock secondary to aspiration pneumonia and bleeding Coagulopathy secondary to Eliquis use Atrial fibrillation P:  Hemodynamic monitoring per ICU protocol IV fluids and pressors to maintain mean arterial blood pressure greater than 65 CVP monitoring every 4 hours Hold Eliquis Monitor PT/INR every 12 hours Fresh frozen plasma and red blood cells for bleeding  RENAL A:   Acute renal failure Hypomagnesemia Hypocalcemia P:   IV fluids Trend creatinine Avoid nephrotoxic drugs Monitor and replace electrolytes  GASTROINTESTINAL A:   Acute upper and lower GI bleed- likely due to anticoagulant use P:   Hold anticoagulation Transfuse blood products as needed Awaiting GI input  HEMATOLOGIC A:   Acute blood loss anemia-hemoglobin 4.6 upon arrival, improved to 10.5 posttransfusion now down to 8.6; patient is still having persistent upper and lower GI bleeding P:  Continue Protonix and octreotide Replace blood products as needed CBC every 4 hours  INFECTIOUS A:   Aspiration pneumonia Sepsis secondary to pneumonia P:   We will broaden her antibiotics to vancomycin, cefepime and Flagyl Trend procalcitonin and adjust antibiotics as needed Blood cultures not drawn; will obtain blood cultures if patient becomes febrile   ENDOCRINE A:   Type 2 diabetes with hyperglycemia P:   Blood glucose monitoring with moderate sliding scale insulin coverage Hold all home oral anti-diabetic medications Consider IV insulin if blood glucose levels are consistently greater than 300 mg/dL  NEUROLOGIC A:   Acute metabolic encephalopathy-CT head negative P:   RASS goal:  0 to -1 Propofol and PRN Versed for sedation and comfort Wake-up assessments weaning trials as tolerated  Best Practice: Code Status: DNR Diet: N.p.o. GI prophylaxis: Protonix and octreotide VTE prophylaxis: SCDs; no pharmacologic DVT prophylaxis in light of brisk bleeding.  Further changes in treatment plan pending clinical course and diagnostics  FAMILY  - Updates: Sounds updated at bedside.  All questions  answered.  Also called and updated the son who lives with him Mr. Keelia Graybill regarding the large bloody stool and central line placement  Shawnta Zimbelman S. [place] ANP-BC Pulmonary and Critical Care Medicine [facility] Pager [phone] or [phone]  NB: This document was prepared using Dragon voice recognition software and may include unintentional dictation errors.    03/25/2018, 8:50 PM

## 2018-04-05 ENCOUNTER — Inpatient Hospital Stay: Payer: PPO

## 2018-04-05 DIAGNOSIS — J690 Pneumonitis due to inhalation of food and vomit: Secondary | ICD-10-CM

## 2018-04-05 DIAGNOSIS — K922 Gastrointestinal hemorrhage, unspecified: Secondary | ICD-10-CM

## 2018-04-05 DIAGNOSIS — D62 Acute posthemorrhagic anemia: Secondary | ICD-10-CM

## 2018-04-05 DIAGNOSIS — R6521 Severe sepsis with septic shock: Secondary | ICD-10-CM

## 2018-04-05 DIAGNOSIS — J9600 Acute respiratory failure, unspecified whether with hypoxia or hypercapnia: Secondary | ICD-10-CM

## 2018-04-05 DIAGNOSIS — R578 Other shock: Secondary | ICD-10-CM

## 2018-04-05 DIAGNOSIS — A419 Sepsis, unspecified organism: Secondary | ICD-10-CM

## 2018-04-05 DIAGNOSIS — Z7189 Other specified counseling: Secondary | ICD-10-CM

## 2018-04-05 DIAGNOSIS — R571 Hypovolemic shock: Secondary | ICD-10-CM

## 2018-04-05 LAB — TYPE AND SCREEN
ABO/RH(D): O POS
ANTIBODY SCREEN: NEGATIVE
Unit division: 0
Unit division: 0
Unit division: 0
Unit division: 0
Unit division: 0

## 2018-04-05 LAB — CBC
HCT: 25.4 % — ABNORMAL LOW (ref 36.0–46.0)
HCT: 22.6 % — ABNORMAL LOW (ref 36.0–46.0)
HCT: 20.4 % — ABNORMAL LOW (ref 36.0–46.0)
Hemoglobin: 8.6 g/dL — ABNORMAL LOW (ref 12.0–15.0)
Hemoglobin: 7.5 g/dL — ABNORMAL LOW (ref 12.0–15.0)
Hemoglobin: 7.1 g/dL — ABNORMAL LOW (ref 12.0–15.0)
MCH: 30.1 pg (ref 26.0–34.0)
MCH: 29.5 pg (ref 26.0–34.0)
MCH: 30 pg (ref 26.0–34.0)
MCHC: 33.9 g/dL (ref 30.0–36.0)
MCHC: 33.2 g/dL (ref 30.0–36.0)
MCHC: 34.8 g/dL (ref 30.0–36.0)
MCV: 88.8 fL (ref 80.0–100.0)
MCV: 89 fL (ref 80.0–100.0)
MCV: 86.1 fL (ref 80.0–100.0)
NRBC: 10.9 % — AB (ref 0.0–0.2)
PLATELETS: 129 10*3/uL — AB (ref 150–400)
Platelets: 121 10*3/uL — ABNORMAL LOW (ref 150–400)
Platelets: 133 10*3/uL — ABNORMAL LOW (ref 150–400)
RBC: 2.86 MIL/uL — ABNORMAL LOW (ref 3.87–5.11)
RBC: 2.54 MIL/uL — ABNORMAL LOW (ref 3.87–5.11)
RBC: 2.37 MIL/uL — ABNORMAL LOW (ref 3.87–5.11)
RDW: 14.8 % (ref 11.5–15.5)
RDW: 15.1 % (ref 11.5–15.5)
RDW: 15.1 % (ref 11.5–15.5)
WBC: 4.2 10*3/uL (ref 4.0–10.5)
WBC: 3.3 10*3/uL — AB (ref 4.0–10.5)
WBC: 3.4 10*3/uL — ABNORMAL LOW (ref 4.0–10.5)
nRBC: 10.8 % — ABNORMAL HIGH (ref 0.0–0.2)
nRBC: 16.9 % — ABNORMAL HIGH (ref 0.0–0.2)

## 2018-04-05 LAB — PHOSPHORUS: Phosphorus: 5.7 mg/dL — ABNORMAL HIGH (ref 2.5–4.6)

## 2018-04-05 LAB — PREPARE FRESH FROZEN PLASMA
Unit division: 0
Unit division: 0

## 2018-04-05 LAB — BLOOD GAS, ARTERIAL
Acid-base deficit: 7.1 mmol/L — ABNORMAL HIGH (ref 0.0–2.0)
Bicarbonate: 16.3 mmol/L — ABNORMAL LOW (ref 20.0–28.0)
FIO2: 1
LHR: 16 {breaths}/min
MECHVT: 450 mL
O2 Saturation: 81.2 %
PATIENT TEMPERATURE: 37
PEEP: 5 cmH2O
pCO2 arterial: 27 mmHg — ABNORMAL LOW (ref 32.0–48.0)
pH, Arterial: 7.39 (ref 7.350–7.450)
pO2, Arterial: 46 mmHg — ABNORMAL LOW (ref 83.0–108.0)

## 2018-04-05 LAB — BPAM FFP
Blood Product Expiration Date: 201912232359
Blood Product Expiration Date: 201912272359
ISSUE DATE / TIME: 201912221737
ISSUE DATE / TIME: 201912222221
UNIT TYPE AND RH: 6200
Unit Type and Rh: 5100

## 2018-04-05 LAB — BPAM RBC
BLOOD PRODUCT EXPIRATION DATE: 202001172359
Blood Product Expiration Date: 202001012359
Blood Product Expiration Date: 202001102359
Blood Product Expiration Date: 202001112359
Blood Product Expiration Date: 202001172359
ISSUE DATE / TIME: 201912221433
ISSUE DATE / TIME: 201912221558
ISSUE DATE / TIME: 201912221558
ISSUE DATE / TIME: 201912221433
Unit Type and Rh: 5100
Unit Type and Rh: 5100
Unit Type and Rh: 5100
Unit Type and Rh: 5100
Unit Type and Rh: 9500

## 2018-04-05 LAB — LACTIC ACID, PLASMA
Lactic Acid, Venous: 10 mmol/L (ref 0.5–1.9)
Lactic Acid, Venous: 9.9 mmol/L (ref 0.5–1.9)
Lactic Acid, Venous: 9.2 mmol/L (ref 0.5–1.9)

## 2018-04-05 LAB — PATHOLOGIST SMEAR REVIEW

## 2018-04-05 LAB — COMPREHENSIVE METABOLIC PANEL
ALT: 17 U/L (ref 0–44)
ALT: 17 U/L (ref 0–44)
AST: 47 U/L — ABNORMAL HIGH (ref 15–41)
AST: 41 U/L (ref 15–41)
Albumin: 1.8 g/dL — ABNORMAL LOW (ref 3.5–5.0)
Albumin: 2.3 g/dL — ABNORMAL LOW (ref 3.5–5.0)
Alkaline Phosphatase: 52 U/L (ref 38–126)
Alkaline Phosphatase: 43 U/L (ref 38–126)
Anion gap: 15 (ref 5–15)
Anion gap: 13 (ref 5–15)
BUN: 22 mg/dL (ref 8–23)
BUN: 21 mg/dL (ref 8–23)
CO2: 14 mmol/L — ABNORMAL LOW (ref 22–32)
CO2: 15 mmol/L — ABNORMAL LOW (ref 22–32)
CREATININE: 1.08 mg/dL — AB (ref 0.44–1.00)
Calcium: 5.9 mg/dL — CL (ref 8.9–10.3)
Calcium: 6.1 mg/dL — CL (ref 8.9–10.3)
Chloride: 108 mmol/L (ref 98–111)
Chloride: 105 mmol/L (ref 98–111)
Creatinine, Ser: 1.08 mg/dL — ABNORMAL HIGH (ref 0.44–1.00)
GFR calc Af Amer: 51 mL/min — ABNORMAL LOW (ref >60)
GFR calc Af Amer: 51 mL/min — ABNORMAL LOW (ref >60)
GFR calc non Af Amer: 44 mL/min — ABNORMAL LOW (ref >60)
GFR, EST NON AFRICAN AMERICAN: 44 mL/min — AB (ref >60)
Glucose, Bld: 339 mg/dL — ABNORMAL HIGH (ref 70–99)
Glucose, Bld: 278 mg/dL — ABNORMAL HIGH (ref 70–99)
Potassium: 3.9 mmol/L (ref 3.5–5.1)
Potassium: 2.9 mmol/L — ABNORMAL LOW (ref 3.5–5.1)
Sodium: 137 mmol/L (ref 135–145)
Sodium: 133 mmol/L — ABNORMAL LOW (ref 135–145)
Total Bilirubin: 0.7 mg/dL (ref 0.3–1.2)
Total Bilirubin: 0.7 mg/dL (ref 0.3–1.2)
Total Protein: 3.1 g/dL — ABNORMAL LOW (ref 6.5–8.1)
Total Protein: 3.5 g/dL — ABNORMAL LOW (ref 6.5–8.1)

## 2018-04-05 LAB — MAGNESIUM
MAGNESIUM: 1.4 mg/dL — AB (ref 1.7–2.4)
MAGNESIUM: 1.8 mg/dL (ref 1.7–2.4)

## 2018-04-05 LAB — PROTIME-INR
INR: 1.53
Prothrombin Time: 18.2 seconds — ABNORMAL HIGH (ref 11.4–15.2)

## 2018-04-05 LAB — GLUCOSE, CAPILLARY
Glucose-Capillary: 292 mg/dL — ABNORMAL HIGH (ref 70–99)
Glucose-Capillary: 250 mg/dL — ABNORMAL HIGH (ref 70–99)
Glucose-Capillary: 218 mg/dL — ABNORMAL HIGH (ref 70–99)

## 2018-04-05 LAB — TROPONIN I
Troponin I: 0.08 ng/mL (ref <0.03)
Troponin I: 0.08 ng/mL (ref <0.03)

## 2018-04-05 MED ORDER — POTASSIUM CHLORIDE 20 MEQ PO PACK
40.0000 meq | PACK | ORAL | Status: DC
  Administered 2018-04-05: 40 meq
  Filled 2018-04-05: qty 2

## 2018-04-05 MED ORDER — LEVALBUTEROL HCL 0.63 MG/3ML IN NEBU
0.6300 mg | INHALATION_SOLUTION | RESPIRATORY_TRACT | Status: DC
  Administered 2018-04-05 (×3): 0.63 mg via RESPIRATORY_TRACT
  Filled 2018-04-05 (×3): qty 3

## 2018-04-05 MED ORDER — IPRATROPIUM-ALBUTEROL 0.5-2.5 (3) MG/3ML IN SOLN
3.0000 mL | RESPIRATORY_TRACT | Status: DC
  Administered 2018-04-05: 3 mL via RESPIRATORY_TRACT

## 2018-04-05 MED ORDER — GLYCOPYRROLATE 0.2 MG/ML IJ SOLN: 0.2000 mg | INTRAMUSCULAR | Status: DC | PRN

## 2018-04-05 MED ORDER — VANCOMYCIN HCL IN DEXTROSE 1-5 GM/200ML-% IV SOLN
1000.0000 mg | INTRAVENOUS | Status: AC
  Administered 2018-04-05: 1000 mg via INTRAVENOUS
  Filled 2018-04-05: qty 200

## 2018-04-05 MED ORDER — NOREPINEPHRINE 16 MG/250ML-% IV SOLN
0.0000 ug/min | INTRAVENOUS | Status: DC
  Administered 2018-04-05: 50 ug/min via INTRAVENOUS
  Administered 2018-04-05: 20 ug/min via INTRAVENOUS
  Filled 2018-04-05 (×3): qty 250

## 2018-04-05 MED ORDER — MORPHINE 100MG IN NS 100ML (1MG/ML) PREMIX INFUSION
5.0000 mg/h | INTRAVENOUS | Status: DC
  Administered 2018-04-05: 5 mg/h via INTRAVENOUS
  Filled 2018-04-05: qty 100

## 2018-04-05 MED ORDER — ACETAMINOPHEN 10 MG/ML IV SOLN
1000.0000 mg | Freq: Once | INTRAVENOUS | Status: AC
  Administered 2018-04-05: 1000 mg via INTRAVENOUS
  Filled 2018-04-05: qty 100

## 2018-04-05 MED ORDER — CALCIUM GLUCONATE-NACL 1-0.675 GM/50ML-% IV SOLN
1.0000 g | INTRAVENOUS | Status: AC
  Administered 2018-04-05 (×2): 1000 mg via INTRAVENOUS
  Filled 2018-04-05 (×2): qty 50

## 2018-04-05 MED ORDER — IPRATROPIUM-ALBUTEROL 0.5-2.5 (3) MG/3ML IN SOLN
RESPIRATORY_TRACT | Status: AC
  Administered 2018-04-05: 3 mL via RESPIRATORY_TRACT
  Filled 2018-04-05: qty 3

## 2018-04-05 MED ORDER — IPRATROPIUM BROMIDE 0.02 % IN SOLN
0.5000 mg | RESPIRATORY_TRACT | Status: DC
  Administered 2018-04-05 (×3): 0.5 mg via RESPIRATORY_TRACT
  Filled 2018-04-05 (×3): qty 2.5

## 2018-04-05 MED ORDER — POLYVINYL ALCOHOL 1.4 % OP SOLN
1.0000 [drp] | Freq: Four times a day (QID) | OPHTHALMIC | Status: DC | PRN
  Filled 2018-04-05: qty 15

## 2018-04-05 MED ORDER — INSULIN ASPART 100 UNIT/ML ~~LOC~~ SOLN
0.0000 [IU] | SUBCUTANEOUS | Status: DC
  Administered 2018-04-05: 5 [IU] via SUBCUTANEOUS
  Filled 2018-04-05 (×2): qty 1

## 2018-04-05 MED ORDER — STERILE WATER FOR INJECTION IV SOLN
INTRAVENOUS | Status: DC
  Administered 2018-04-05: 02:00:00 via INTRAVENOUS
  Filled 2018-04-05 (×5): qty 850

## 2018-04-05 MED ORDER — MORPHINE SULFATE (PF) 2 MG/ML IV SOLN
2.0000 mg | INTRAVENOUS | Status: DC | PRN
  Filled 2018-04-05: qty 5

## 2018-04-05 MED ORDER — ALBUMIN HUMAN 25 % IV SOLN
25.0000 g | Freq: Once | INTRAVENOUS | Status: AC
  Administered 2018-04-05: 25 g via INTRAVENOUS
  Filled 2018-04-05: qty 100

## 2018-04-05 MED ORDER — VANCOMYCIN VARIABLE DOSE PER UNSTABLE RENAL FUNCTION (PHARMACIST DOSING): Status: DC

## 2018-04-05 MED ORDER — CALCIUM GLUCONATE-NACL 2-0.675 GM/100ML-% IV SOLN: 2.0000 g | Freq: Once | INTRAVENOUS | Status: DC

## 2018-04-05 MED ORDER — GLYCOPYRROLATE 1 MG PO TABS: 1.0000 mg | ORAL_TABLET | ORAL | Status: DC | PRN

## 2018-04-05 MED ORDER — VASOPRESSIN 20 UNIT/ML IV SOLN
0.0300 [IU]/min | INTRAVENOUS | Status: DC
  Filled 2018-04-05: qty 2

## 2018-04-05 MED ORDER — CALCIUM GLUCONATE-NACL 1-0.675 GM/50ML-% IV SOLN
1.0000 g | INTRAVENOUS | Status: AC
  Administered 2018-04-05 (×3): 1000 mg via INTRAVENOUS
  Filled 2018-04-05 (×3): qty 50

## 2018-04-05 MED ORDER — METRONIDAZOLE IN NACL 5-0.79 MG/ML-% IV SOLN
500.0000 mg | Freq: Three times a day (TID) | INTRAVENOUS | Status: DC
  Administered 2018-04-05 (×2): 500 mg via INTRAVENOUS
  Filled 2018-04-05 (×4): qty 100

## 2018-04-05 MED ORDER — SODIUM CHLORIDE 0.9 % IV BOLUS
1000.0000 mL | Freq: Once | INTRAVENOUS | Status: AC
  Administered 2018-04-05: 1000 mL via INTRAVENOUS

## 2018-04-05 MED ORDER — SODIUM BICARBONATE 8.4 % IV SOLN
150.0000 meq | Freq: Once | INTRAVENOUS | Status: AC
  Administered 2018-04-05: 150 meq via INTRAVENOUS
  Filled 2018-04-05: qty 150

## 2018-04-05 MED ORDER — INSULIN ASPART 100 UNIT/ML ~~LOC~~ SOLN
8.0000 [IU] | Freq: Once | SUBCUTANEOUS | Status: DC
  Administered 2018-04-05: 8 [IU] via SUBCUTANEOUS

## 2018-04-05 MED ORDER — DIPHENHYDRAMINE HCL 50 MG/ML IJ SOLN: 25.0000 mg | INTRAMUSCULAR | Status: DC | PRN

## 2018-04-05 MED ORDER — ACETAMINOPHEN 650 MG RE SUPP
650.0000 mg | Freq: Four times a day (QID) | RECTAL | Status: DC | PRN
  Filled 2018-04-05: qty 1

## 2018-04-05 MED ORDER — SODIUM CHLORIDE 0.9 % IV SOLN
1.0000 g | Freq: Two times a day (BID) | INTRAVENOUS | Status: DC
  Administered 2018-04-05 (×2): 1 g via INTRAVENOUS
  Filled 2018-04-05 (×3): qty 1

## 2018-04-08 ENCOUNTER — Telehealth: Payer: Self-pay | Admitting: Pulmonary Disease

## 2018-04-09 NOTE — Telephone Encounter (Signed)
Death certificate completed, Denice Paradise and [place] Silos aware ready for pickup.

## 2018-04-09 NOTE — Telephone Encounter (Signed)
Death Certificate received and placed in MD box for signing.

## 2018-04-14 NOTE — Progress Notes (Signed)
Patient ID: Sarah Dougherty, female   DOB: 03/07/1922, 85 y.o.   MRN: [id number] patient is now comfort care only. Morphine drip to be started. She will be terminally extubated. Informed by Dr. Jamal Collin. I will not follow the patient. She will remain in the ICU and likely not make it through this hospitalization.

## 2018-04-14 NOTE — Discharge Summary (Signed)
DEATH SUMMARY  DATE OF ADMISSION:  Apr 28, 2018  DATE OF DISCHARGE/DEATH:  04/29/2018  ADMISSION DIAGNOSES:   Acute GI bleed Acute blood loss anemia Altered mental status Aspiration pneumonia Hypovolemic shock   DISCHARGE DIAGNOSES:   Acute GI bleed Acute blood loss anemia Altered mental status Aspiration pneumonia Hemorrhagic shock Ventilator dependent respiratory failure Coagulopathy due to apixaban Chronic atrial fibrillation AKI Hypomagnesemia Hypocalcemia Severe sepsis Type 2 diabetes with hyperglycemia Acute metabolic encephalopathy   PRESENTATION:   Pt was admitted with the following HPI and the above admission diagnoses:  Sarah Dougherty  is a 85 y.o. female with a known history listed below presented to emergency room via EMS for evaluation of upper GI bleed and unresponsiveness.  Patient is intubated and sedated.  Unable to provide any history.  All the history is discussed with ER physician and family member at bedside.  As per family member, patient was complaining of constipation yesterday.  Patient had prune juice with multiple episodes of bowel movement.  Family noticed blood in stool.  Today patient was feeling lightheaded.  Patient started having nausea with vomiting.  Bright red blood was noticed in vomit.  EMS was called.  Upon EMS arrival patient was not responding well.  Patient blood pressure was 60/40.  Patient brought to emergency room for further evaluation and treatment.  In emergency room patient was intubated and sedated.  Patient received fluid boluses and 2 units of PRBC.  Patient was put on Protonix and octreotide drip.  Patient also ordered Kcentra.  GI team notified.  CT had done that was negative for acute process.  Hospitalist team requested for ICU admission.   HOSPITAL COURSE:   She presented to the [facility] ED with altered mental status, shock, hematemesis, hematochezia.  She required intubation in the emergency department.  She was resuscitated with  blood products (4 units PRBCs, 2 units FFP, K Centra).  DNR was established on admission.  Gastroenterology consultation was obtained.  We discussed possibility of endoscopy to evaluate source of GI bleeding.  It was explained to the family that this would be unlikely to alter the ultimate outcome of this critical illness.  They opted to forgo EGD in favor of terminal extubation and comfort measures only.  Per their request, this was implemented on the afternoon of 04/30/23.  After extubation, she was maintained on morphine infusion and she passed away shortly thereafter.  No autopsy was performed.    Cause of death:  Hemorrhagic shock  Contributing factors: Acute respiratory failure, acute blood loss anemia, acute kidney injury, type 2 diabetes  Autopsy:  No  Smoking:  No  Merton Border, MD PCCM service Mobile [phone] Pager [phone] 04/06/2018 11:07 AM

## 2018-04-14 NOTE — Progress Notes (Signed)
Lab called with a critical calcium, calcium is being replaced at the time of the call. Jeoffrey Massed, NP notified of same. No new orders at this time.

## 2018-04-14 NOTE — Progress Notes (Signed)
Time of death 1515. Dr. Alva Garnet and [place] notified.

## 2018-04-14 NOTE — Progress Notes (Signed)
Pharmacy Antibiotic Note  Sarah Dougherty is a 85 y.o. female admitted on 04/07/2018 with pneumonia.  Pharmacy has been consulted for van/cefepime  Dosing. Patient received vanc 1g, Cefepime 1g x 1 in ED  Plan: Patient is currently in AKI but improving.   Will check a random level w/ am labs and will dose 15 mg/kg for levels < 20 mcg/mL. Once renal function stabilizes will transition to scheduled regimen. Will continue cefepime 1g IV q12h  Ke 0.0253 T1/2 ~ 24 hrs Goal random < 20 mcg/mL  Height: 5\' 2"  (157.5 cm) Weight: 115 lb 11.9 oz (52.5 kg) IBW/kg (Calculated) : 50.1  Temp (24hrs), Avg:96.4 F (35.8 C), Min:91.6 F (33.1 C), Max:100.6 F (38.1 C)  Recent Labs  Lab 03/14/2018 1430 03/18/2018 1915 04/04/18 2143 Apr 07, 2018 0145  WBC 21.4* 25.2* 21.0* 4.2  CREATININE 1.11*  --   --  1.08*  LATICACIDVEN  --   --   --  10.0*    Estimated Creatinine Clearance: 25.2 mL/min (A) (by C-G formula based on SCr of 1.08 mg/dL (H)).    Allergies  Allergen Reactions  . Amoxicillin Other (See Comments)    unknown  . Cephalexin Other (See Comments)    unknown  . Nitrofurantoin Other (See Comments)     Unknown  . Penicillins Other (See Comments)    Other reaction(s): Unknown  . Sulfa Antibiotics Hives and Itching  . Atorvastatin     Other reaction(s): Muscle Pain  . Azithromycin Rash    Thank you for allowing pharmacy to be a part of this patient's care.  Tobie Lords 07-Apr-2018 5:28 AM

## 2018-04-14 NOTE — Progress Notes (Signed)
Pharmacy Electrolyte Monitoring Consult:  Pharmacy consulted to assist in monitoring and replacing electrolytes in this 85 y.o. female admitted on 03/26/2018 with GI Bleeding   Labs:  Sodium (mmol/L)  Date Value  04-12-18 137  02/16/2014 138   Potassium (mmol/L)  Date Value  Apr 12, 2018 3.9  02/16/2014 3.5   Magnesium (mg/dL)  Date Value  12-Apr-2018 1.4 (L)  02/16/2014 1.7 (L)   Phosphorus (mg/dL)  Date Value  2018-04-12 5.7 (H)   Calcium (mg/dL)  Date Value  12-Apr-2018 5.9 (LL)   Calcium, Total (mg/dL)  Date Value  02/16/2014 8.3 (L)   Albumin (g/dL)  Date Value  Apr 12, 2018 1.8 (L)  10/22/2011 3.6    Assessment/Plan: 12/23 @ 0200 Ca 5.9; CCa 7.66, Mg 1.4 will replace w/ calcium gluconate 1g IV x 3, patient received mag 2g IV x 1 @ 0130 will recheck electrolytes @ 0800  Tobie Lords, PharmD, BCPS Clinical Pharmacist 12-Apr-2018

## 2018-04-14 NOTE — Progress Notes (Signed)
Patient extubated to comfort care per MD request. No complications noted.

## 2018-04-14 NOTE — Consult Note (Signed)
Sarah Lame, MD [facility]  [address], [place],  [address] Phone: [phone] Fax : [phone]  Consultation  Referring Provider:     Dr. Posey Pronto Primary Care Physician:  Perrin Maltese, MD Primary Gastroenterologist:  Dr. Vicente Males         Reason for Consultation:     Upper GI bleed  Date of Admission:  03/18/2018 Date of Consultation:  2018-04-30         HPI:   Sarah Dougherty is a 85 y.o. female who has a history of a CT scan showing a mass near the ampulla versus a duodenal mass.  The patient subsequently had an EGD and biopsy of this lesion which showed duodenitis without any sign of cancer.  The patient recently had been seen by orthopedics for left hip hemi-arthroplasty. The patient has a history of CHF coronary artery disease and has been on anticoagulation for atrial fibrillation.  The patient was independent at baseline and lives with her son who found her covered with blood she had vomited and minimally responsive.  The patient's son then called EMS and the patient was found to be hypotensive.  The patient son also reported that the patient has been having dark stools for the last 2 weeks.  The patient was admitted to the ICU and transfused blood after her admission hemoglobin was found to be 4.6.  After transfusion the patient's hemoglobin was 10.5 but has slowly come down to 7.1 today.  Past Medical History:  Diagnosis Date  . A-fib ([place])   . Arthritis    Osteoarthritis  . Asthma   . Cancer (HCC)    PAROTID GLAND  . CHF (congestive heart failure) ([place])   . Chronic kidney disease     / CKD STAGE 3  . COPD (chronic obstructive pulmonary disease) ([place])   . Coronary artery disease   . Diabetes mellitus without complication ([place])   . Dysrhythmia    CHRONIC AFIB  . GERD (gastroesophageal reflux disease)   . Glaucoma (increased eye pressure)   . Hip fracture ([place]) 01/24/2018  . HOH (hard of hearing)    RIGHT HEARING AID  . Hypertension   . Hyperthyroidism    . Presence of permanent cardiac pacemaker   . Sleep apnea    No C-PAP/ NO LONGER WEARS OXYGEN    Past Surgical History:  Procedure Laterality Date  . ABDOMINAL HYSTERECTOMY    . BRAIN SURGERY    . CHOLECYSTECTOMY    . ENUCLEATION Right    EYE  . ESOPHAGOGASTRODUODENOSCOPY (EGD) WITH PROPOFOL N/A 01/08/2018   Procedure: ESOPHAGOGASTRODUODENOSCOPY (EGD) WITH PROPOFOL;  Surgeon: Jonathon Bellows, MD;  Location: [facility] ENDOSCOPY;  Service: Gastroenterology;  Laterality: N/A;  . EYE SURGERY Left    Cataract Extraction with IOL  . EYE SURGERY Right    Artificial Eye  . FRACTURE SURGERY Left 01/27/2018   LEFT FEMUR  . HIP ARTHROPLASTY Right 04/07/2017   Procedure: ARTHROPLASTY BIPOLAR HIP (HEMIARTHROPLASTY);  Surgeon: Dereck Leep, MD;  Location: [facility] ORS;  Service: Orthopedics;  Laterality: Right;  . INSERT / REPLACE / REMOVE PACEMAKER    . INTRAMEDULLARY (IM) NAIL INTERTROCHANTERIC Left 01/27/2018   Procedure: INTRAMEDULLARY (IM) NAIL INTERTROCHANTRIC ( TFNA );  Surgeon: Dereck Leep, MD;  Location: [facility] ORS;  Service: Orthopedics;  Laterality: Left;  . JOINT REPLACEMENT Right 04/07/2017   HIP/HEMI ARTHROPLASTY  . PACEMAKER INSERTION Left 12/12/2014   Procedure: INSERTION PACEMAKER;  Surgeon: Isaias Cowman, MD;  Location: [facility]  ORS;  Service: Cardiovascular;  Laterality: Left;  . PAROTIDECTOMY Left 12/10/2016   Procedure: PAROTIDECTOMY;  Surgeon: Clyde Canterbury, MD;  Location: [facility] ORS;  Service: ENT;  Laterality: Left;  . RADICAL NECK DISSECTION Left 12/10/2016   Procedure: RADICAL NECK DISSECTION;  Surgeon: Clyde Canterbury, MD;  Location: [facility] ORS;  Service: ENT;  Laterality: Left;  . TOTAL HIP REVISION Left 03/10/2018   Procedure: TOTAL HIP REVISION;  Surgeon: Dereck Leep, MD;  Location: [facility] ORS;  Service: Orthopedics;  Laterality: Left;    Prior to Admission medications   Medication Sig Start Date End Date Taking? Authorizing Provider  acetaminophen (TYLENOL 8 HOUR) 650 MG  CR tablet Take 650 mg by mouth every 8 (eight) hours as needed for pain.   Yes [provider]  albuterol (PROAIR HFA) 108 (90 Base) MCG/ACT inhaler Inhale 1 puff into the lungs every 6 (six) hours as needed for wheezing or shortness of breath. 02/09/18  Yes Gerlene Fee, NP  amLODipine (NORVASC) 5 MG tablet Take 1 tablet by mouth daily. 02/20/18  Yes [provider]  apixaban (ELIQUIS) 2.5 MG TABS tablet Take 1 tablet (2.5 mg total) by mouth 2 (two) times daily. 02/09/18  Yes Gerlene Fee, NP  brimonidine (ALPHAGAN) 0.2 % ophthalmic solution Place 1 drop into the left eye 2 (two) times daily. 02/09/18  Yes Gerlene Fee, NP  Cholecalciferol (VITAMIN D) 2000 units tablet Take 2,000 Units by mouth daily.   Yes [provider]  CRANBERRY PO Take 1 capsule by mouth daily.   Yes [provider]  glimepiride (AMARYL) 2 MG tablet Take 1 tablet (2 mg total) by mouth daily. 02/09/18  Yes Gerlene Fee, NP  [facility]) CREA Apply liberal amount to area of skin irritation every shift.  Okay to leave at bedside 01/30/18  Yes [provider]  LORazepam (ATIVAN) 0.5 MG tablet Take 0.5 mg by mouth daily as needed for anxiety.  03/09/18  Yes [provider]  nystatin cream (MYCOSTATIN) Apply 1 application topically 2 (two) times daily. For vaginal itching   Yes [provider]  sodium chloride (OCEAN) 0.65 % nasal spray Place 2 sprays into the nose daily as needed for congestion.  09/14/14  Yes [provider]  amiodarone (PACERONE) 100 MG tablet Take 1 tablet (100 mg total) by mouth daily. Patient not taking: Reported on 04/06/2018 02/09/18   Gerlene Fee, NP  travoprost, benzalkonium, (TRAVATAN) 0.004 % ophthalmic solution Place 1 drop into the left eye at bedtime. Patient not taking: Reported on 04/03/2018 02/09/18   Gerlene Fee, NP    Family History  Problem Relation Age of Onset  . Diabetes  Brother   . Diabetes Son   . Stroke Mother   . Cancer Father   . Stroke Sister   . Stroke Sister   . COPD Brother   . Diabetes Son      Social History   Tobacco Use  . Smoking status: Never Smoker  . Smokeless tobacco: Never Used  Substance Use Topics  . Alcohol use: No  . Drug use: No    Allergies as of 03/15/2018 - Review Complete 03/14/2018  Allergen Reaction Noted  . Amoxicillin Other (See Comments) 12/01/2016  . Cephalexin Other (See Comments) 12/01/2016  . Nitrofurantoin Other (See Comments) 12/09/2014  . Penicillins Other (See Comments) 12/09/2014  . Sulfa antibiotics Hives and Itching 12/09/2014  . Atorvastatin  02/08/2015  . Azithromycin Rash 12/09/2014  Review of Systems:    All systems reviewed and negative except where noted in HPI.   Physical Exam:  Vital signs in last 24 hours: Temp:  [91.6 F (33.1 C)-100.6 F (38.1 C)] 97.5 F (36.4 C) (12/23 1100) Pulse Rate:  [59-138] 123 (12/23 1100) Resp:  [14-37] 20 (12/23 1100) BP: (50-145)/(31-73) 92/52 (12/23 1100) SpO2:  [88 %-100 %] 92 % (12/23 1202) FiO2 (%):  [50 %-100 %] 90 % (12/23 1202) Weight:  [45.8 kg-52.5 kg] 52.5 kg (12/23 0500)   General: Intubated  Psych: Unable to assess  LAB RESULTS: Recent Labs    06-Apr-2018 0145 06-Apr-2018 0542 04/06/18 1107  WBC 4.2 3.3* 3.4*  HGB 8.6* 7.5* 7.1*  HCT 25.4* 22.6* 20.4*  PLT 129* 121* 133*   BMET Recent Labs    04/03/2018 1430 2018-04-06 0145 2018-04-06 0542  NA 132* 137 133*  K 4.6 3.9 2.9*  CL 103 108 105  CO2 9* 14* 15*  GLUCOSE 334* 339* 278*  BUN 27* 22 21  CREATININE 1.11* 1.08* 1.08*  CALCIUM 7.6* 5.9* 6.1*   LFT Recent Labs    Apr 06, 2018 0542  PROT 3.5*  ALBUMIN 2.3*  AST 41  ALT 17  ALKPHOS 43  BILITOT 0.7   PT/INR Recent Labs    03/14/2018 2143 2018-04-06 0145  LABPROT 18.7* 18.2*  INR 1.58 1.53    STUDIES: Ct Head Wo Contrast  Result Date: 03/31/2018 CLINICAL DATA:  Loss of consciousness in minimally responsive.  EXAM: CT HEAD WITHOUT CONTRAST TECHNIQUE: Contiguous axial images were obtained from the base of the skull through the vertex without intravenous contrast. COMPARISON:  01/24/2018. FINDINGS: Brain: There is no evidence for acute hemorrhage, hydrocephalus, mass lesion, or abnormal extra-axial fluid collection. No definite CT evidence for acute infarction. Patchy low attenuation in the deep hemispheric and periventricular white matter is nonspecific, but likely reflects chronic microvascular ischemic demyelination. 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 Vascular: No hyperdense vessel or unexpected calcification. Skull: Apparent burr holes in the left skull. Otherwise unremarkable. Sinuses/Orbits: Chronic mucosal thickening in the ethmoid sinuses. Paranasal sinuses otherwise unremarkable. Right globe prosthesis. Left orbit unremarkable. Other: None. IMPRESSION: 1. Stable.  No acute intracranial abnormality. 2. 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 Electronically Signed   By: Misty Stanley M.D.   On: 04/08/2018 16:57   Dg Chest Port 1 View  Result Date: Apr 06, 2018 CLINICAL DATA:  Acute respiratory failure EXAM: PORTABLE CHEST 1 VIEW COMPARISON:  03/14/2018 FINDINGS: Cardiac shadow is stable. Endotracheal tube and nasogastric catheter are again seen and stable position. Pacing device is again seen. Lungs are well aerated bilaterally. Minimal left basilar atelectasis is seen. No bony abnormality is noted. IMPRESSION: Minimal left basilar atelectasis. Tubes and lines as described stable from the previous exam. Electronically Signed   By: Inez Catalina M.D.   On: 04-06-2018 01:22   Dg Chest Portable 1 View  Result Date: 03/30/2018 CLINICAL DATA:  Status post CPR. Check tube placement. EXAM: PORTABLE CHEST 1 VIEW COMPARISON:  03/14/2018. FINDINGS: 1515 hours. Endotracheal tube tip is 1.1 cm above the base of the carina. NG tube tip is positioned in the mid  stomach. Cardiopericardial silhouette is at upper limits of normal for size. Interstitial markings are diffusely coarsened with chronic features. The lungs are clear without focal pneumonia, edema, pneumothorax or pleural effusion. Bones are diffusely demineralized. Telemetry leads overlie the chest. Left permanent pacemaker noted. IMPRESSION: Support apparatus as above.  No acute cardiopulmonary findings.  Electronically Signed   By: Misty Stanley M.D.   On: 03/22/2018 15:48      Impression / Plan:   Assessment: Active Problems:   Acute respiratory failure (HCC)   GI bleed   Septic shock (HCC)   Hypovolemic shock (HCC)   Acute blood loss anemia   Aspiration pneumonia (HCC)   JESSICCA STITZER is a 85 y.o. y/o female with an acute upper GI bleed.  The patient also had hypovolemic shock with aspiration.  Plan:  Prior to seeing the patient I met with the family and explained the options for this patient.  Present during this was Dr. Alva Garnet.  The family had decided to not proceed with any GI intervention at this time.  The patient was made comfort care.  Nothing further to add from a GI point of view.  Thank you for involving me in the care of this patient.      LOS: 1 day   Sarah Lame, MD  04-14-18, 2:49 PM    Note: This dictation was prepared with Dragon dictation along with smaller phrase technology. Any transcriptional errors that result from this process are unintentional.

## 2018-04-14 NOTE — Progress Notes (Signed)
I reviewed her admission and database in its entirety.  I discussed with Dr. [name] Norris regarding options of care.  Dr. [name] Norris and I spoke with patient's family (3 sons and a daughter-in-law) regarding options.  I explained that we could proceed with EGD which might define a source of bleeding and might allow for an intervention to control the bleeding.  However, I explained that this would not likely have a major impact on the overall outcome.  Patient has been in declining health for several months.  At her baseline, she is quite frail and dependent.  To survive this kind of an illness would almost certainly require more capacity for recovery than she has at this point in her life.  After our discussion, the family had a chance to speak among themselves and they returned with the desire to forego EGD and proceed with terminal extubation and comfort measures only.  These orders have been placed by me.  Merton Border, MD PCCM service Mobile [phone] Pager [phone] April 10, 2018 1:55 PM

## 2018-04-14 NOTE — Progress Notes (Signed)
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   -Patient had a recent EGD on 01/08/2018 which found a large hiatal hernia, and a medium-sized submucosal mass with no bleeding in second portion of duodenum. Pathology results showed duodenal mucosa with non-specific chronic duodenitis. Negative for dysplasia and malignancy.  Following RD assessment this AM patient has now transitioned to comfort care. Orders for extubation are in place. No nutrition interventions warranted. Please consult RD as needed.  Willey Blade, MS, [place], LDN Office: [phone] Pager: [phone] After Hours/Weekend Pager: [phone]

## 2018-04-14 DEATH — deceased

## 2018-04-19 ENCOUNTER — Other Ambulatory Visit: Payer: Self-pay | Admitting: *Deleted

## 2018-04-19 NOTE — Patient Outreach (Signed)
[facility]) Care Management  04/19/2018  Sarah Dougherty [birth year] [id number]   Opened in error to remove from CM [employer]. Lavina Hamman, RN, BSN, [place] Coordinator Office number [phone] Mobile number [phone]  Main [facility] number [phone] Fax number [phone]

## 2018-06-09 ENCOUNTER — Ambulatory Visit: Payer: PPO | Admitting: Radiation Oncology

## 2020-01-16 IMAGING — CT CT ABD-PELV W/ CM
2 of 5 series · 14 of 46 positions shown, 16 images · IV contrast (APPLIED)
Comparison: None.

CLINICAL DATA: [AGE] female with a history of lower abdominal
pain.

EXAM:
CT ABDOMEN AND PELVIS WITH CONTRAST
TECHNIQUE: Multidetector CT imaging of the abdomen and pelvis was performed
using the standard protocol following bolus administration of
intravenous contrast.
CONTRAST:  75mL OMNIPAQUE IOHEXOL 300 MG/ML  SOLN

[Series 2: routine abd/pel with · axial · 0.70mm/px · z∈[-766,-341]mm · 11 of 95 slices shown, 13 images]
[im 5/95  soft-tissue]
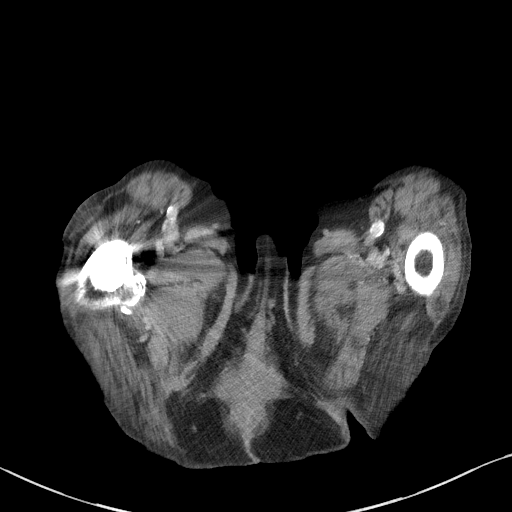
[im 5/95  bone]
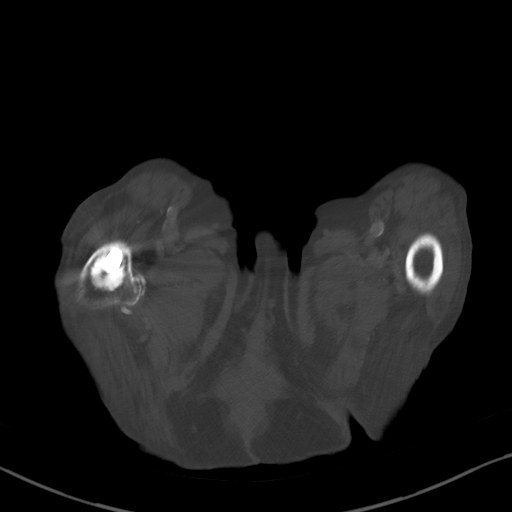
[im 15/95  soft-tissue]
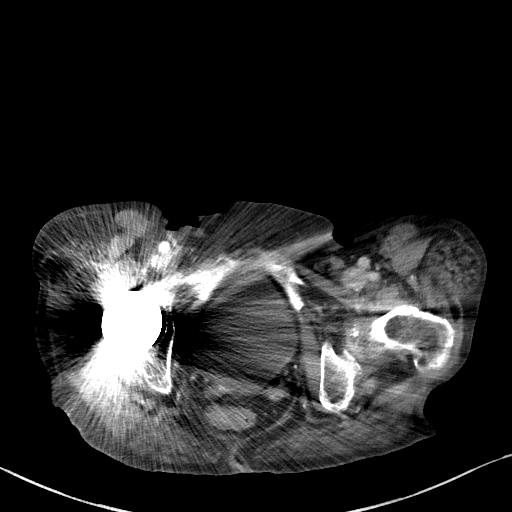
[im 24/95  soft-tissue]
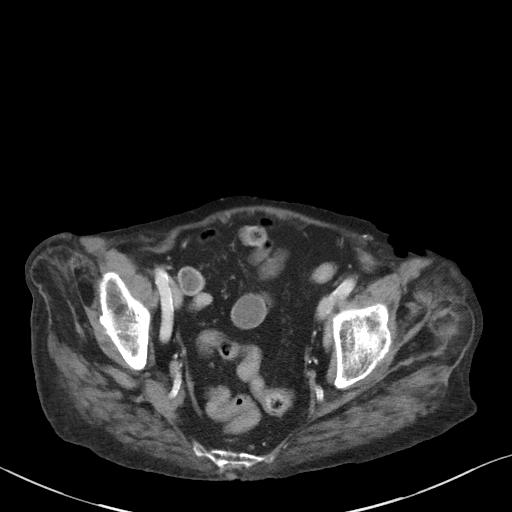
[im 33/95  soft-tissue]
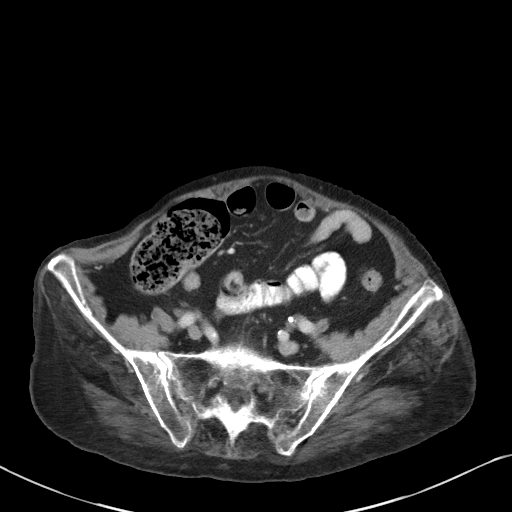
[im 38/95  soft-tissue]
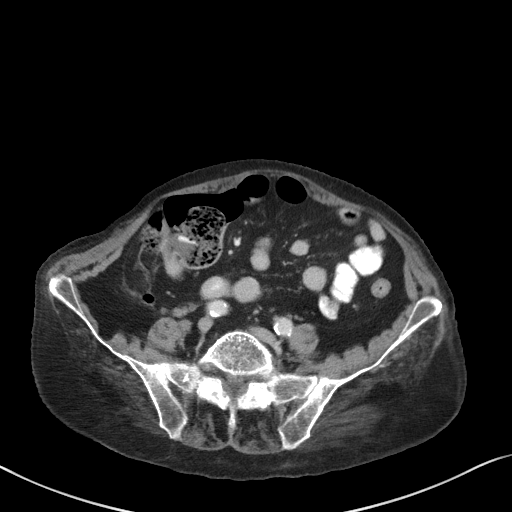
[im 48/95  soft-tissue]
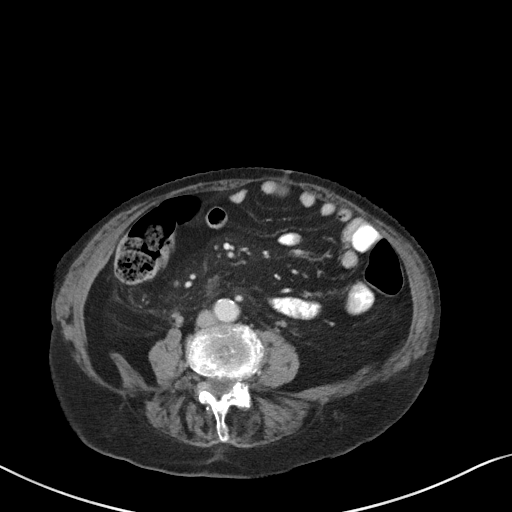
[im 57/95  soft-tissue]
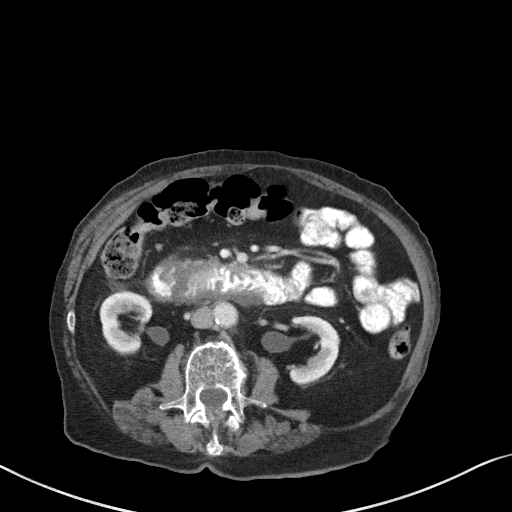
[im 62/95  soft-tissue]
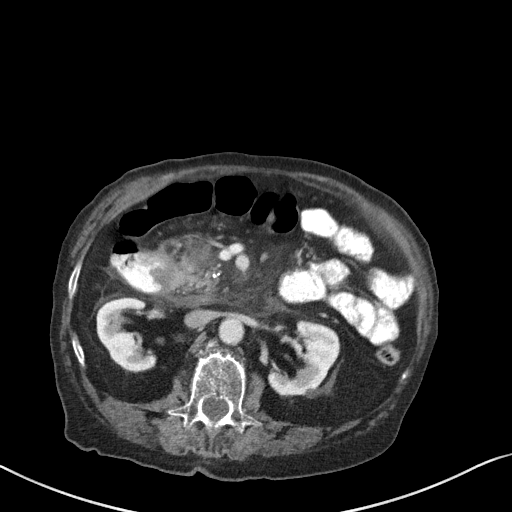
[im 71/95  soft-tissue]
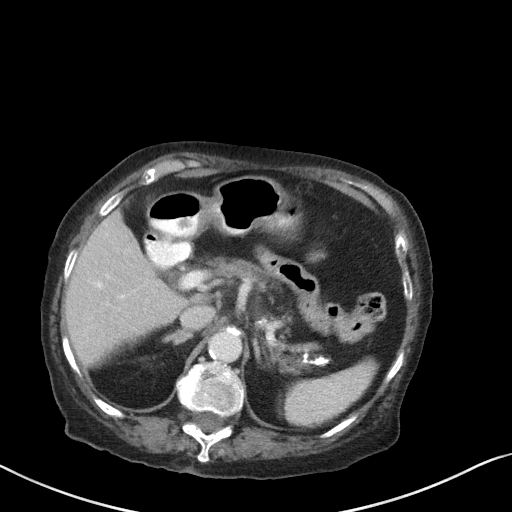
[im 71/95  bone]
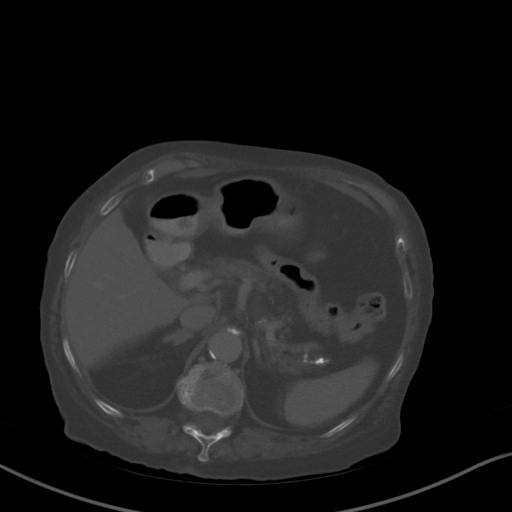
[im 80/95  soft-tissue]
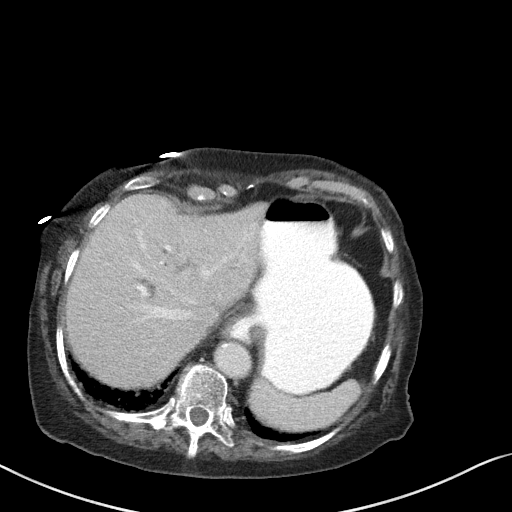
[im 90/95  soft-tissue]
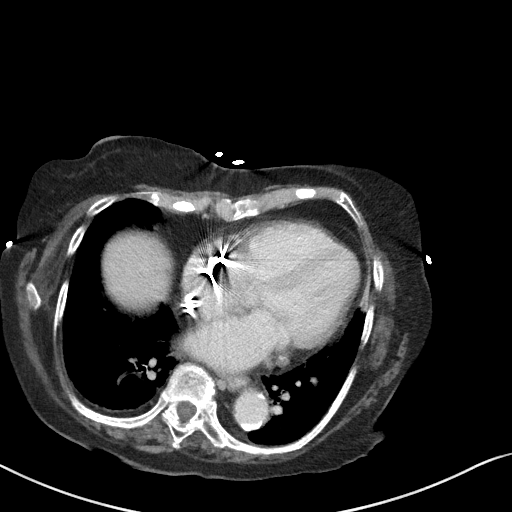

[Series 6: coronal st · coronal · 0.64mm/px · 3 of 77 slices shown]
[im 26/77  soft-tissue]
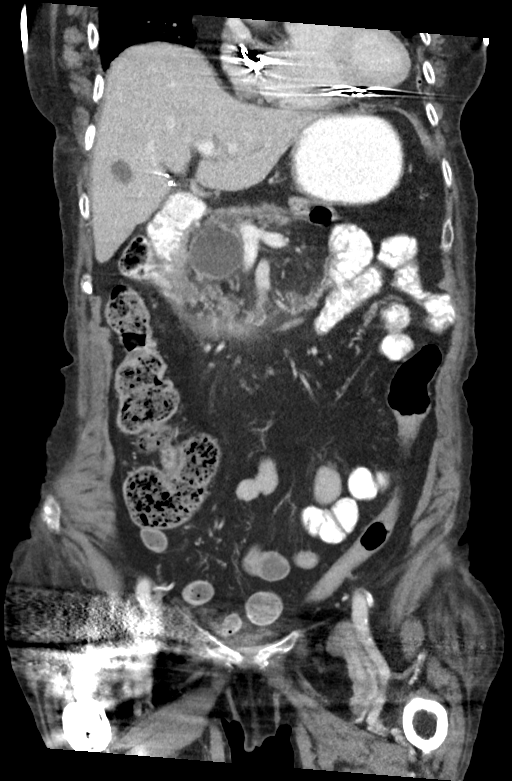
[im 34/77  soft-tissue]
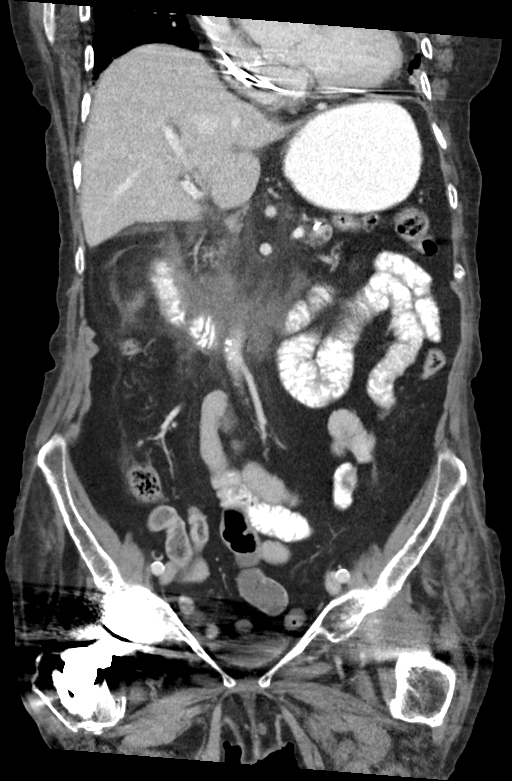
[im 43/77  soft-tissue]
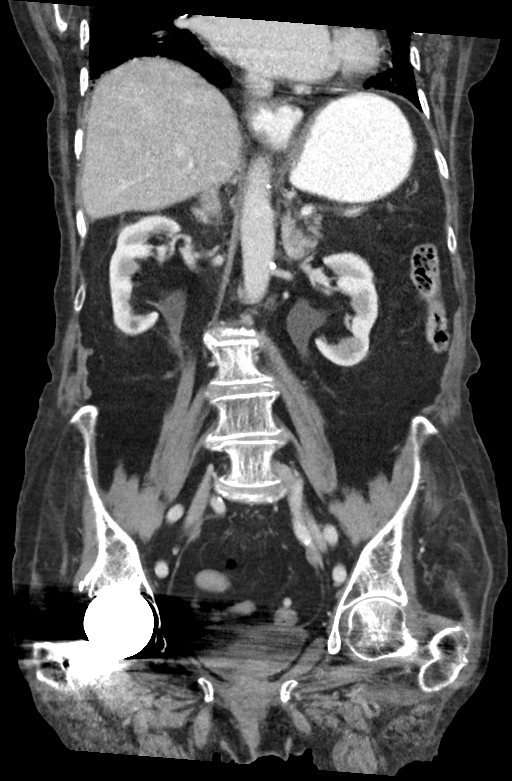

[14 of 46 positions shown; findings below may reference images not displayed]

FINDINGS: Lower chest: Cardiomegaly. Native coronary disease. Partially imaged
cardiac pacing leads. Respiratory motion limits evaluation of the
lung bases.

Hepatobiliary: Liver parenchyma relatively unremarkable. Low-density
cystic lesion within the right liver measures 2.4 cm, likely benign
biliary cyst with no internal complexity.

Trace intrahepatic biliary ductal dilatation. Surgical changes of
cholecystectomy. Common hepatic duct unremarkable. The common bile
duct is not well visualized, though favored to be of unremarkable
diameter. The distal common bile duct and ampulla appears to be
continuous with a polypoid mass projecting into the lumen of the
duodenum with intermediate Hounsfield units/enhancement pattern.
This measures 2.0 cm x 2.2 cm.

Pancreas: Pancreas is atrophic with dilated main pancreatic duct.
Low-density cyst without internal complexity or enhancement within
the pancreatic head measures 3.4 cm. Mild inflammatory changes at
the head of the pancreas at the pancreatic groove, in within the
fascial planes posterior to the duodenum.

Spleen: Unremarkable spleen

Adrenals/Urinary Tract: Unremarkable adrenal glands.

Bilateral kidneys with no evidence of nephrolithiasis or
hydronephrosis. Subcentimeter low-density cystic lesions of the left
kidney, none of which are large enough to evaluate by CT.
Unremarkable course the bilateral ureters.

Unremarkable urinary bladder.

Stomach/Bowel: Hiatal hernia.  Otherwise unremarkable stomach.

Mass continuous with the biliary ampulla measures 2.2 cm x 2.0 cm as
a filling defect within the second portion the duodenum. There is
circumferential duodenal thickening at the [DATE] portion of the
duodenum with associated inflammatory changes of the fat.

Small bowel unremarkable without transition point or evidence of
obstruction. Normal appendix. Colon decompressed. Mild diverticular
change without evidence of acute inflammatory changes.

Vascular/Lymphatic: Calcifications of the abdominal aorta and the
iliac arteries. Calcifications of the bilateral femoral arteries.
Iliac and femoral arteries are patent.

Nodularity adjacent to the duodenum may represent small implants or
lymphadenopathy.

No para-aortic/preaortic lymph nodes. No inguinal or iliac
lymphadenopathy.

Reproductive: Hysterectomy.

Other: No ventral wall hernia.

Musculoskeletal: Osteopenia. Multilevel degenerative changes of the
thoracolumbar spine. Surgical changes of right hip arthroplasty.
IMPRESSION: Tumor of the second portion duodenum, favored to represent either
adenocarcinoma of the ampulla or a primary duodenal adenocarcinoma.
Referral for multidisciplinary evaluation suggested, including GI
for endoscopy as well as oncology.

Inflammatory changes of the duodenum and the adjacent fascial planes
with small local lymph nodes, likely related to tumor involvement,
with or without associated pancreatitis.

These results were called by telephone at the time of interpretation
on 01/05/2018 at [DATE] to Dr. IEGO RUBINO .

Pancreatic atrophy and main duct dilation, compatible with
longstanding partial occlusion/stenosis. There is mild intrahepatic
biliary ductal dilatation.

Cyst of the pancreatic head, may represent pseudocyst or a
synchronous cystic tumor.

Low-density cyst of the right liver, likely benign biliary cyst.

Cholecystectomy.

Aortic Atherosclerosis ([id number]).
